# Patient Record
Sex: Female | Born: 1935
Health system: Southern US, Community
[De-identification: ages and names within clinical notes are randomized; demographics above are authoritative.]

## PROBLEM LIST (undated history)

## (undated) ENCOUNTER — Emergency Department (HOSPITAL_BASED_OUTPATIENT_CLINIC_OR_DEPARTMENT_OTHER): Payer: Medicare Other | Source: Home / Self Care

## (undated) DIAGNOSIS — C9000 Multiple myeloma not having achieved remission: Secondary | ICD-10-CM

## (undated) DIAGNOSIS — E785 Hyperlipidemia, unspecified: Secondary | ICD-10-CM

## (undated) DIAGNOSIS — R209 Unspecified disturbances of skin sensation: Secondary | ICD-10-CM

## (undated) DIAGNOSIS — R279 Unspecified lack of coordination: Secondary | ICD-10-CM

## (undated) DIAGNOSIS — I10 Essential (primary) hypertension: Secondary | ICD-10-CM

## (undated) DIAGNOSIS — B351 Tinea unguium: Secondary | ICD-10-CM

## (undated) DIAGNOSIS — R51 Headache: Secondary | ICD-10-CM

## (undated) DIAGNOSIS — T6391XA Toxic effect of contact with unspecified venomous animal, accidental (unintentional), initial encounter: Secondary | ICD-10-CM

## (undated) DIAGNOSIS — M542 Cervicalgia: Secondary | ICD-10-CM

## (undated) DIAGNOSIS — J209 Acute bronchitis, unspecified: Secondary | ICD-10-CM

## (undated) DIAGNOSIS — R040 Epistaxis: Secondary | ICD-10-CM

## (undated) DIAGNOSIS — H6120 Impacted cerumen, unspecified ear: Secondary | ICD-10-CM

## (undated) DIAGNOSIS — E669 Obesity, unspecified: Secondary | ICD-10-CM

## (undated) DIAGNOSIS — Z7189 Other specified counseling: Secondary | ICD-10-CM

## (undated) DIAGNOSIS — B373 Candidiasis of vulva and vagina: Secondary | ICD-10-CM

## (undated) DIAGNOSIS — M1990 Unspecified osteoarthritis, unspecified site: Secondary | ICD-10-CM

## (undated) DIAGNOSIS — R42 Dizziness and giddiness: Secondary | ICD-10-CM

## (undated) DIAGNOSIS — Z8679 Personal history of other diseases of the circulatory system: Secondary | ICD-10-CM

## (undated) DIAGNOSIS — F411 Generalized anxiety disorder: Secondary | ICD-10-CM

## (undated) DIAGNOSIS — Z87891 Personal history of nicotine dependence: Secondary | ICD-10-CM

## (undated) DIAGNOSIS — I6350 Cerebral infarction due to unspecified occlusion or stenosis of unspecified cerebral artery: Secondary | ICD-10-CM

## (undated) HISTORY — DX: Headache: R51

## (undated) HISTORY — DX: Dizziness and giddiness: R42

## (undated) HISTORY — DX: Cervicalgia: M54.2

## (undated) HISTORY — DX: Candidiasis of vulva and vagina: B37.3

## (undated) HISTORY — DX: Generalized anxiety disorder: F41.1

## (undated) HISTORY — DX: Impacted cerumen, unspecified ear: H61.20

## (undated) HISTORY — DX: Unspecified lack of coordination: R27.9

## (undated) HISTORY — DX: Personal history of other diseases of the circulatory system: Z86.79

## (undated) HISTORY — DX: Essential (primary) hypertension: I10

## (undated) HISTORY — DX: Unspecified osteoarthritis, unspecified site: M19.90

## (undated) HISTORY — DX: Acute bronchitis, unspecified: J20.9

## (undated) HISTORY — DX: Epistaxis: R04.0

## (undated) HISTORY — DX: Cerebral infarction due to unspecified occlusion or stenosis of unspecified cerebral artery: I63.50

## (undated) HISTORY — DX: Unspecified disturbances of skin sensation: R20.9

## (undated) HISTORY — DX: Toxic effect of contact with unspecified venomous animal, accidental (unintentional), initial encounter: T63.91XA

## (undated) HISTORY — DX: Tinea unguium: B35.1

## (undated) HISTORY — DX: Hyperlipidemia, unspecified: E78.5

## (undated) HISTORY — DX: Obesity, unspecified: E66.9

## (undated) HISTORY — PX: BREAST SURGERY: SHX581

## (undated) HISTORY — DX: Personal history of nicotine dependence: Z87.891

## (undated) HISTORY — PX: ABDOMINAL HYSTERECTOMY: SHX81

## (undated) HISTORY — DX: Multiple myeloma not having achieved remission: C90.00

---

## 1998-04-03 ENCOUNTER — Ambulatory Visit (HOSPITAL_COMMUNITY): Admission: RE | Admit: 1998-04-03 | Discharge: 1998-04-03 | Payer: Self-pay | Admitting: Internal Medicine

## 1999-04-17 ENCOUNTER — Ambulatory Visit (HOSPITAL_COMMUNITY): Admission: RE | Admit: 1999-04-17 | Discharge: 1999-04-17 | Payer: Self-pay

## 1999-04-17 ENCOUNTER — Encounter: Payer: Self-pay | Admitting: Internal Medicine

## 2000-04-16 ENCOUNTER — Emergency Department (HOSPITAL_COMMUNITY): Admission: EM | Admit: 2000-04-16 | Discharge: 2000-04-16 | Payer: Self-pay | Admitting: Emergency Medicine

## 2000-04-21 ENCOUNTER — Ambulatory Visit (HOSPITAL_COMMUNITY): Admission: RE | Admit: 2000-04-21 | Discharge: 2000-04-21 | Payer: Self-pay | Admitting: Internal Medicine

## 2000-04-21 ENCOUNTER — Encounter: Payer: Self-pay | Admitting: Internal Medicine

## 2001-04-26 ENCOUNTER — Encounter: Payer: Self-pay | Admitting: Internal Medicine

## 2001-04-26 ENCOUNTER — Ambulatory Visit (HOSPITAL_COMMUNITY): Admission: RE | Admit: 2001-04-26 | Discharge: 2001-04-26 | Payer: Self-pay | Admitting: Internal Medicine

## 2002-04-27 ENCOUNTER — Encounter: Payer: Self-pay | Admitting: Internal Medicine

## 2002-04-27 ENCOUNTER — Ambulatory Visit (HOSPITAL_COMMUNITY): Admission: RE | Admit: 2002-04-27 | Discharge: 2002-04-27 | Payer: Self-pay | Admitting: Internal Medicine

## 2002-05-03 ENCOUNTER — Encounter: Admission: RE | Admit: 2002-05-03 | Discharge: 2002-05-03 | Payer: Self-pay | Admitting: Internal Medicine

## 2002-05-03 ENCOUNTER — Encounter: Payer: Self-pay | Admitting: Internal Medicine

## 2002-08-24 ENCOUNTER — Encounter: Payer: Self-pay | Admitting: Internal Medicine

## 2002-08-24 ENCOUNTER — Encounter: Admission: RE | Admit: 2002-08-24 | Discharge: 2002-08-24 | Payer: Self-pay | Admitting: Internal Medicine

## 2002-10-06 ENCOUNTER — Ambulatory Visit (HOSPITAL_COMMUNITY): Admission: RE | Admit: 2002-10-06 | Discharge: 2002-10-06 | Payer: Self-pay | Admitting: Internal Medicine

## 2002-10-06 ENCOUNTER — Encounter: Payer: Self-pay | Admitting: Internal Medicine

## 2003-05-03 ENCOUNTER — Ambulatory Visit (HOSPITAL_COMMUNITY): Admission: RE | Admit: 2003-05-03 | Discharge: 2003-05-03 | Payer: Self-pay | Admitting: Internal Medicine

## 2003-08-17 ENCOUNTER — Inpatient Hospital Stay (HOSPITAL_COMMUNITY): Admission: EM | Admit: 2003-08-17 | Discharge: 2003-08-18 | Payer: Self-pay | Admitting: Emergency Medicine

## 2004-04-01 ENCOUNTER — Ambulatory Visit: Payer: Self-pay | Admitting: Internal Medicine

## 2004-04-11 ENCOUNTER — Ambulatory Visit: Payer: Self-pay | Admitting: Internal Medicine

## 2004-04-15 ENCOUNTER — Ambulatory Visit: Payer: Self-pay | Admitting: Internal Medicine

## 2004-05-15 ENCOUNTER — Encounter: Admission: RE | Admit: 2004-05-15 | Discharge: 2004-05-15 | Payer: Self-pay | Admitting: Internal Medicine

## 2005-03-14 ENCOUNTER — Ambulatory Visit: Payer: Self-pay | Admitting: Internal Medicine

## 2005-05-30 ENCOUNTER — Encounter: Admission: RE | Admit: 2005-05-30 | Discharge: 2005-05-30 | Payer: Self-pay | Admitting: Internal Medicine

## 2005-09-22 ENCOUNTER — Ambulatory Visit: Payer: Self-pay | Admitting: Internal Medicine

## 2005-10-28 ENCOUNTER — Ambulatory Visit: Payer: Self-pay | Admitting: Internal Medicine

## 2005-12-01 ENCOUNTER — Ambulatory Visit: Payer: Self-pay | Admitting: Internal Medicine

## 2006-03-11 ENCOUNTER — Ambulatory Visit: Payer: Self-pay | Admitting: Internal Medicine

## 2006-03-11 LAB — CONVERTED CEMR LAB
Albumin: 3.6 g/dL (ref 3.5–5.2)
Alkaline Phosphatase: 71 units/L (ref 39–117)
BUN: 9 mg/dL (ref 6–23)
Chloride: 107 meq/L (ref 96–112)
Glomerular Filtration Rate, Af Am: 70 mL/min/{1.73_m2}
Potassium: 3.9 meq/L (ref 3.5–5.1)
Total Bilirubin: 1.3 mg/dL — ABNORMAL HIGH (ref 0.3–1.2)
Total Protein: 6.7 g/dL (ref 6.0–8.3)
Triglyceride fasting, serum: 55 mg/dL (ref 0–149)

## 2006-05-21 ENCOUNTER — Ambulatory Visit: Payer: Self-pay | Admitting: Internal Medicine

## 2006-06-02 ENCOUNTER — Encounter: Admission: RE | Admit: 2006-06-02 | Discharge: 2006-06-02 | Payer: Self-pay | Admitting: Internal Medicine

## 2006-06-02 ENCOUNTER — Ambulatory Visit: Payer: Self-pay | Admitting: Internal Medicine

## 2006-06-12 ENCOUNTER — Encounter: Admission: RE | Admit: 2006-06-12 | Discharge: 2006-06-12 | Payer: Self-pay | Admitting: Internal Medicine

## 2006-07-06 ENCOUNTER — Ambulatory Visit: Payer: Self-pay | Admitting: Internal Medicine

## 2006-09-02 ENCOUNTER — Ambulatory Visit: Payer: Self-pay | Admitting: Internal Medicine

## 2006-12-07 ENCOUNTER — Ambulatory Visit: Payer: Self-pay | Admitting: Internal Medicine

## 2006-12-08 DIAGNOSIS — E785 Hyperlipidemia, unspecified: Secondary | ICD-10-CM

## 2006-12-08 DIAGNOSIS — M1990 Unspecified osteoarthritis, unspecified site: Secondary | ICD-10-CM | POA: Insufficient documentation

## 2006-12-08 DIAGNOSIS — I10 Essential (primary) hypertension: Secondary | ICD-10-CM

## 2006-12-08 HISTORY — DX: Hyperlipidemia, unspecified: E78.5

## 2006-12-08 HISTORY — DX: Essential (primary) hypertension: I10

## 2006-12-08 HISTORY — DX: Unspecified osteoarthritis, unspecified site: M19.90

## 2007-03-05 ENCOUNTER — Encounter: Payer: Self-pay | Admitting: Internal Medicine

## 2007-03-05 ENCOUNTER — Ambulatory Visit: Payer: Self-pay | Admitting: Internal Medicine

## 2007-03-05 DIAGNOSIS — F411 Generalized anxiety disorder: Secondary | ICD-10-CM

## 2007-03-05 DIAGNOSIS — T6391XA Toxic effect of contact with unspecified venomous animal, accidental (unintentional), initial encounter: Secondary | ICD-10-CM

## 2007-03-05 HISTORY — DX: Toxic effect of contact with unspecified venomous animal, accidental (unintentional), initial encounter: T63.91XA

## 2007-03-05 HISTORY — DX: Generalized anxiety disorder: F41.1

## 2007-03-05 LAB — CONVERTED CEMR LAB
AST: 17 units/L (ref 0–37)
Albumin: 3.8 g/dL (ref 3.5–5.2)
Alkaline Phosphatase: 69 units/L (ref 39–117)
BUN: 11 mg/dL (ref 6–23)
Bilirubin, Direct: 0.2 mg/dL (ref 0.0–0.3)
CO2: 31 meq/L (ref 19–32)
Calcium: 9.1 mg/dL (ref 8.4–10.5)
Cholesterol: 195 mg/dL (ref 0–200)
Creatinine, Ser: 0.7 mg/dL (ref 0.4–1.2)
GFR calc Af Amer: 105 mL/min
GFR calc non Af Amer: 86 mL/min
LDL Cholesterol: 127 mg/dL — ABNORMAL HIGH (ref 0–99)
Potassium: 4 meq/L (ref 3.5–5.1)
Sodium: 142 meq/L (ref 135–145)
TSH: 0.68 microintl units/mL (ref 0.35–5.50)
Total Protein: 7 g/dL (ref 6.0–8.3)
Vit D, 1,25-Dihydroxy: 35 (ref 30–89)

## 2007-09-10 ENCOUNTER — Ambulatory Visit: Payer: Self-pay | Admitting: Internal Medicine

## 2007-12-16 ENCOUNTER — Telehealth: Payer: Self-pay | Admitting: Internal Medicine

## 2008-03-01 ENCOUNTER — Ambulatory Visit: Payer: Self-pay | Admitting: Internal Medicine

## 2008-03-03 LAB — CONVERTED CEMR LAB
ALT: 14 units/L (ref 0–35)
AST: 16 units/L (ref 0–37)
Albumin: 3.3 g/dL — ABNORMAL LOW (ref 3.5–5.2)
Calcium: 8.8 mg/dL (ref 8.4–10.5)
Chloride: 109 meq/L (ref 96–112)
Crystals: NEGATIVE
GFR calc Af Amer: 104 mL/min
Glucose, Bld: 103 mg/dL — ABNORMAL HIGH (ref 70–99)
LDL Cholesterol: 102 mg/dL — ABNORMAL HIGH (ref 0–99)
Mucus, UA: NEGATIVE
Nitrite: NEGATIVE
Sodium: 146 meq/L — ABNORMAL HIGH (ref 135–145)
Total Bilirubin: 1 mg/dL (ref 0.3–1.2)
Total Protein, Urine: NEGATIVE mg/dL
Total Protein: 7 g/dL (ref 6.0–8.3)
pH: 5.5 (ref 5.0–8.0)

## 2008-03-09 ENCOUNTER — Ambulatory Visit: Payer: Self-pay | Admitting: Internal Medicine

## 2008-06-05 ENCOUNTER — Telehealth: Payer: Self-pay | Admitting: Internal Medicine

## 2008-06-05 ENCOUNTER — Ambulatory Visit: Payer: Self-pay | Admitting: Cardiology

## 2008-06-05 ENCOUNTER — Ambulatory Visit: Payer: Self-pay | Admitting: Internal Medicine

## 2008-06-05 DIAGNOSIS — R51 Headache: Secondary | ICD-10-CM

## 2008-06-05 DIAGNOSIS — R040 Epistaxis: Secondary | ICD-10-CM

## 2008-06-05 DIAGNOSIS — R519 Headache, unspecified: Secondary | ICD-10-CM | POA: Insufficient documentation

## 2008-06-05 DIAGNOSIS — H6120 Impacted cerumen, unspecified ear: Secondary | ICD-10-CM

## 2008-06-05 DIAGNOSIS — M542 Cervicalgia: Secondary | ICD-10-CM

## 2008-06-05 HISTORY — DX: Cervicalgia: M54.2

## 2008-06-05 HISTORY — DX: Epistaxis: R04.0

## 2008-06-05 HISTORY — DX: Impacted cerumen, unspecified ear: H61.20

## 2008-06-05 HISTORY — DX: Headache: R51

## 2008-07-04 ENCOUNTER — Telehealth: Payer: Self-pay | Admitting: Internal Medicine

## 2008-08-31 ENCOUNTER — Ambulatory Visit: Payer: Self-pay | Admitting: Internal Medicine

## 2008-08-31 LAB — CONVERTED CEMR LAB
ALT: 15 units/L (ref 0–35)
AST: 21 units/L (ref 0–37)
Alkaline Phosphatase: 68 units/L (ref 39–117)
CO2: 29 meq/L (ref 19–32)
Calcium: 9.4 mg/dL (ref 8.4–10.5)
Cholesterol: 162 mg/dL (ref 0–200)
Creatinine, Ser: 0.7 mg/dL (ref 0.4–1.2)
GFR calc non Af Amer: 104.01 mL/min (ref >60)
Glucose, Bld: 100 mg/dL — ABNORMAL HIGH (ref 70–99)
Leukocytes, UA: NEGATIVE
Potassium: 3.8 meq/L (ref 3.5–5.1)
Specific Gravity, Urine: >=1.03 (ref 1.000–1.030)
Total Bilirubin: 1.5 mg/dL — ABNORMAL HIGH (ref 0.3–1.2)
Total CHOL/HDL Ratio: 3
Total Protein, Urine: NEGATIVE mg/dL
Total Protein: 6.6 g/dL (ref 6.0–8.3)
Triglycerides: 53 mg/dL (ref 0.0–149.0)
VLDL: 10.6 mg/dL (ref 0.0–40.0)

## 2008-09-08 ENCOUNTER — Ambulatory Visit: Payer: Self-pay | Admitting: Internal Medicine

## 2008-09-08 DIAGNOSIS — B373 Candidiasis of vulva and vagina: Secondary | ICD-10-CM

## 2008-09-08 DIAGNOSIS — B3731 Acute candidiasis of vulva and vagina: Secondary | ICD-10-CM

## 2008-09-08 HISTORY — DX: Acute candidiasis of vulva and vagina: B37.31

## 2008-09-08 HISTORY — DX: Candidiasis of vulva and vagina: B37.3

## 2009-02-28 ENCOUNTER — Ambulatory Visit: Payer: Self-pay | Admitting: Internal Medicine

## 2009-03-01 LAB — CONVERTED CEMR LAB
ALT: 18 units/L (ref 0–35)
Albumin: 3.9 g/dL (ref 3.5–5.2)
Alkaline Phosphatase: 64 units/L (ref 39–117)
Bilirubin Urine: NEGATIVE
Bilirubin, Direct: 0.2 mg/dL (ref 0.0–0.3)
CO2: 31 meq/L (ref 19–32)
Chloride: 100 meq/L (ref 96–112)
Creatinine, Ser: 0.6 mg/dL (ref 0.4–1.2)
Glucose, Bld: 95 mg/dL (ref 70–99)
Hemoglobin, Urine: NEGATIVE
Ketones, ur: NEGATIVE mg/dL
Specific Gravity, Urine: 1.025 (ref 1.000–1.030)
Total CHOL/HDL Ratio: 3
Total Protein, Urine: NEGATIVE mg/dL
Total Protein: 7.3 g/dL (ref 6.0–8.3)
Triglycerides: 68 mg/dL (ref 0.0–149.0)
Urine Glucose: NEGATIVE mg/dL
Urobilinogen, UA: 0.2 (ref 0.0–1.0)
pH: 5.5 (ref 5.0–8.0)

## 2009-03-09 ENCOUNTER — Ambulatory Visit: Payer: Self-pay | Admitting: Internal Medicine

## 2009-03-09 DIAGNOSIS — B351 Tinea unguium: Secondary | ICD-10-CM

## 2009-03-09 DIAGNOSIS — Z87891 Personal history of nicotine dependence: Secondary | ICD-10-CM

## 2009-03-09 HISTORY — DX: Personal history of nicotine dependence: Z87.891

## 2009-03-09 HISTORY — DX: Tinea unguium: B35.1

## 2009-03-13 ENCOUNTER — Telehealth: Payer: Self-pay | Admitting: Internal Medicine

## 2009-03-20 ENCOUNTER — Telehealth: Payer: Self-pay | Admitting: Internal Medicine

## 2009-03-21 ENCOUNTER — Ambulatory Visit: Payer: Self-pay | Admitting: Internal Medicine

## 2009-03-22 LAB — CONVERTED CEMR LAB
Nitrite: NEGATIVE
Specific Gravity, Urine: >=1.03 (ref 1.000–1.030)
Urobilinogen, UA: 0.2 (ref 0.0–1.0)
pH: 5.5 (ref 5.0–8.0)

## 2009-09-07 ENCOUNTER — Ambulatory Visit: Payer: Self-pay | Admitting: Internal Medicine

## 2009-09-07 DIAGNOSIS — E669 Obesity, unspecified: Secondary | ICD-10-CM

## 2009-09-07 HISTORY — DX: Obesity, unspecified: E66.9

## 2009-10-10 ENCOUNTER — Telehealth: Payer: Self-pay | Admitting: Internal Medicine

## 2009-12-26 ENCOUNTER — Ambulatory Visit: Payer: Self-pay | Admitting: Internal Medicine

## 2009-12-26 DIAGNOSIS — R42 Dizziness and giddiness: Secondary | ICD-10-CM

## 2009-12-26 DIAGNOSIS — R279 Unspecified lack of coordination: Secondary | ICD-10-CM

## 2009-12-26 DIAGNOSIS — R209 Unspecified disturbances of skin sensation: Secondary | ICD-10-CM

## 2009-12-26 HISTORY — DX: Unspecified lack of coordination: R27.9

## 2009-12-26 HISTORY — DX: Dizziness and giddiness: R42

## 2009-12-26 HISTORY — DX: Unspecified disturbances of skin sensation: R20.9

## 2009-12-26 LAB — CONVERTED CEMR LAB
AST: 16 units/L (ref 0–37)
Albumin: 3.8 g/dL (ref 3.5–5.2)
BUN: 18 mg/dL (ref 6–23)
Basophils Absolute: 0 10*3/uL (ref 0.0–0.1)
CO2: 31 meq/L (ref 19–32)
Calcium: 8.9 mg/dL (ref 8.4–10.5)
Eosinophils Absolute: 0.1 10*3/uL (ref 0.0–0.7)
GFR calc non Af Amer: 52.6 mL/min (ref >60)
Glucose, Bld: 97 mg/dL (ref 70–99)
HCT: 37.8 % (ref 36.0–46.0)
Leukocytes, UA: NEGATIVE
Lymphocytes Relative: 24.8 % (ref 12.0–46.0)
Lymphs Abs: 2.1 10*3/uL (ref 0.7–4.0)
MCHC: 33.8 g/dL (ref 30.0–36.0)
Monocytes Relative: 6.5 % (ref 3.0–12.0)
Neutro Abs: 5.7 10*3/uL (ref 1.4–7.7)
Nitrite: NEGATIVE
Platelets: 221 10*3/uL (ref 150.0–400.0)
RDW: 15 % — ABNORMAL HIGH (ref 11.5–14.6)
Specific Gravity, Urine: 1.02 (ref 1.000–1.030)
TSH: 0.68 microintl units/mL (ref 0.35–5.50)
Total Bilirubin: 0.8 mg/dL (ref 0.3–1.2)
Urobilinogen, UA: 0.2 (ref 0.0–1.0)
pH: 7 (ref 5.0–8.0)

## 2009-12-27 ENCOUNTER — Telehealth: Payer: Self-pay | Admitting: Internal Medicine

## 2010-01-02 ENCOUNTER — Encounter: Admission: RE | Admit: 2010-01-02 | Discharge: 2010-01-02 | Payer: Self-pay | Admitting: Internal Medicine

## 2010-01-04 ENCOUNTER — Ambulatory Visit: Payer: Self-pay | Admitting: Internal Medicine

## 2010-01-04 DIAGNOSIS — I6350 Cerebral infarction due to unspecified occlusion or stenosis of unspecified cerebral artery: Secondary | ICD-10-CM

## 2010-01-04 DIAGNOSIS — Z8679 Personal history of other diseases of the circulatory system: Secondary | ICD-10-CM

## 2010-01-04 DIAGNOSIS — I639 Cerebral infarction, unspecified: Secondary | ICD-10-CM

## 2010-01-04 HISTORY — DX: Cerebral infarction due to unspecified occlusion or stenosis of unspecified cerebral artery: I63.50

## 2010-01-04 HISTORY — DX: Personal history of other diseases of the circulatory system: Z86.79

## 2010-02-12 ENCOUNTER — Ambulatory Visit: Payer: Self-pay | Admitting: Internal Medicine

## 2010-02-12 LAB — CONVERTED CEMR LAB
Chloride: 104 meq/L (ref 96–112)
GFR calc non Af Amer: 117.02 mL/min (ref >60)
Potassium: 4.4 meq/L (ref 3.5–5.1)
Sodium: 142 meq/L (ref 135–145)

## 2010-02-18 ENCOUNTER — Ambulatory Visit: Payer: Self-pay | Admitting: Internal Medicine

## 2010-03-07 ENCOUNTER — Ambulatory Visit: Payer: Self-pay | Admitting: Internal Medicine

## 2010-04-23 ENCOUNTER — Ambulatory Visit: Payer: Self-pay | Admitting: Internal Medicine

## 2010-06-15 ENCOUNTER — Encounter: Payer: Self-pay | Admitting: Orthopaedic Surgery

## 2010-06-16 ENCOUNTER — Encounter: Payer: Self-pay | Admitting: Internal Medicine

## 2010-06-24 ENCOUNTER — Telehealth: Payer: Self-pay | Admitting: Internal Medicine

## 2010-06-25 NOTE — Progress Notes (Signed)
Summary: REQ FOR RX  Phone Note Call from Patient Call back at Home Phone [phone]   Summary of Call: Patient is requesting rx for "sedative" prior to MRI.  Initial call taken by: Lamar Sprinkles, CMA,  December 27, 2009 1:34 PM  Follow-up for Phone Call        diazepam Follow-up by: Tresa Garter MD,  December 28, 2009 7:47 AM  Additional Follow-up for Phone Call Additional follow up Details #1::        Pt informed, rx called into [facility] [address] Additional Follow-up by: Lamar Sprinkles, CMA,  December 28, 2009 9:26 AM    New/Updated Medications: DIAZEPAM 5 MG TABS (DIAZEPAM) 1-2 tab 1 h prior to test Prescriptions: DIAZEPAM 5 MG TABS (DIAZEPAM) 1-2 tab 1 h prior to test  #4 x 0   Entered and Authorized by:   Tresa Garter MD   Signed by:   Tresa Garter MD on 12/28/2009   Method used:   Print then Give to Patient   RxID:   [id number]

## 2010-06-25 NOTE — Progress Notes (Signed)
Summary: pls call  Phone Note Call from Patient Call back at Home Phone [phone]   Caller: Patient Summary of Call: pt called stating that she is "very sick and needs some medicine!". Pt is requesting a call. Initial call taken by: Margaret Pyle, CMA,  Oct 10, 2009 3:57 PM  Follow-up for Phone Call        Spoke w/pt, She c/o sweats, body aches, chills, cough and sinus congestion. Has taken, benadryl, sudafed and nyquil w/no relief. Patient is requesting rx from [facility] Follow-up by: Lamar Sprinkles, CMA,  Oct 10, 2009 4:24 PM  Additional Follow-up for Phone Call Additional follow up Details #1::        ok Z pac OV if not well Additional Follow-up by: Tresa Garter MD,  Oct 10, 2009 6:03 PM    Additional Follow-up for Phone Call Additional follow up Details #2::    Pt informed  Follow-up by: Lamar Sprinkles, CMA,  Oct 10, 2009 6:12 PM  New/Updated Medications: ZITHROMAX Z-PAK 250 MG TABS (AZITHROMYCIN) as dirrected Prescriptions: ZITHROMAX Z-PAK 250 MG TABS (AZITHROMYCIN) as dirrected  #1 x 0   Entered and Authorized by:   Tresa Garter MD   Signed by:   Lamar Sprinkles, CMA on 10/10/2009   Method used:   Electronically to        [facility] [phone]* (retail)       [address]       [place], [place]  [address]       Ph: [phone]       Fax: [phone]   RxID:   [id number]

## 2010-06-25 NOTE — Assessment & Plan Note (Signed)
Summary: dizziness since last friday-lb   Vital Signs:  Patient profile:   77 year old female Height:      67 inches Weight:      250 pounds BMI:     39.30 O2 Sat:      95 % on Room air Temp:     99.0 degrees F oral Pulse rate:   80 / minute Pulse rhythm:   regular Resp:     16 per minute BP sitting:   148 / 80  (left arm) Cuff size:   large  Vitals Entered By: Lanier Prude, CMA(AAMA) (December 26, 2009 4:10 PM)  O2 Flow:  Room air CC: lightheaded X 5 days Is Patient Diabetic? No Comments pt states her head just doesnt feel right   CC:  lightheaded X 5 days.  History of Present Illness: C/o dizziness x 3-5 d, lightheaded, no "spinning". No falls. Denies HA. No injury or febrile illness. Took Zpac - not better...  Current Medications (verified): 1)  Aspir-Low 81 Mg Tbec (Aspirin) .[name].. 1 Once Daily Pc 2)  Toprol Xl 50 Mg  Xr24h-Tab (Metoprolol Succinate) .... Take 1 By Mouth Qd 3)  Pravastatin Sodium 40 Mg  Tabs (Pravastatin Sodium) .... One By Mouth At Bedtime 4)  Penlac 8 % Soln (Ciclopirox) .... Qd 5)  Sudafed 30 Mg Tabs (Pseudoephedrine Hcl) .... 2 Tabs Q 4 H As Needed 6)  Diphenhydramine Hcl 25 Mg Tabs (Diphenhydramine Hcl) .... 2 By Mouth Q 4 H As Needed Allergy  Allergies (verified): 1)  Hydrochlorothiazide 2)  Alprazolam (Alprazolam)  Past History:  Family History: Last updated: 12/08/2006 Family History Hypertension  Social History: Last updated: 03/05/2007 Retired Single Never Smoked Alcohol use-no Regular exercise-no Former Smoker  Past Medical History: Hyperlipidemia Hypertension Osteoarthritis Anxiety Fall with head injury (no LOC) 1/10  Past Surgical History: Denies surgical history  Review of Systems       The patient complains of muscle weakness and difficulty walking.  The patient denies fever, chest pain, dyspnea on exertion, abdominal pain, and severe indigestion/heartburn.    Physical Exam  Neurologic:  ataxic gait.      Impression & Recommendations:  Problem # 1:  ATAXIA (ICD-781.3) - r/o CVA Assessment New  Orders: TLB-B12, Serum-Total ONLY (60454-U98) TLB-BMP (Basic Metabolic Panel-BMET) (80048-METABOL) TLB-CBC Platelet - w/Differential (85025-CBCD) TLB-Hepatic/Liver Function Pnl (80076-HEPATIC) TLB-TSH (Thyroid Stimulating Hormone) (84443-TSH) TLB-Udip ONLY (81003-UDIP) Radiology Referral (Radiology)  Problem # 2:  DIZZINESS (ICD-780.4) Assessment: New  Her updated medication list for this problem includes:    Diphenhydramine Hcl 25 Mg Tabs (Diphenhydramine hcl) .[name]... 2 by mouth q 4 h as needed allergy  Orders: TLB-B12, Serum-Total ONLY (11914-N82) TLB-BMP (Basic Metabolic Panel-BMET) (80048-METABOL) TLB-CBC Platelet - w/Differential (85025-CBCD) TLB-Hepatic/Liver Function Pnl (80076-HEPATIC) TLB-TSH (Thyroid Stimulating Hormone) (84443-TSH) TLB-Udip ONLY (81003-UDIP) Radiology Referral (Radiology)  Problem # 3:  HYPERTENSION (ICD-401.9) Assessment: Unchanged  Her updated medication list for this problem includes:    Toprol Xl 50 Mg Xr24h-tab (Metoprolol succinate) .[name]... Take 1 by mouth qd  Orders: TLB-B12, Serum-Total ONLY (95621-H08) TLB-BMP (Basic Metabolic Panel-BMET) (80048-METABOL) TLB-CBC Platelet - w/Differential (85025-CBCD) TLB-Hepatic/Liver Function Pnl (80076-HEPATIC) TLB-TSH (Thyroid Stimulating Hormone) (84443-TSH) TLB-Udip ONLY (81003-UDIP)  BP today: 148/80 Prior BP: 140/76 (09/07/2009)  Labs Reviewed: K+: 4.0 (02/28/2009) Creat: : 0.6 (02/28/2009)   Chol: 175 (02/28/2009)   HDL: 55.10 (02/28/2009)   LDL: 106 (02/28/2009)   TG: 68.0 (02/28/2009)  Complete Medication List: 1)  Aspir-low 81 Mg Tbec (Aspirin) .[name].. 1 once daily pc  2)  Toprol Xl 50 Mg Xr24h-tab (Metoprolol succinate) .... Take 1 by mouth qd 3)  Pravastatin Sodium 40 Mg Tabs (Pravastatin sodium) .... One by mouth at bedtime 4)  Penlac 8 % Soln (Ciclopirox) .... Qd 5)  Sudafed 30 Mg Tabs  (Pseudoephedrine hcl) .... 2 tabs q 4 h as needed 6)  Diphenhydramine Hcl 25 Mg Tabs (Diphenhydramine hcl) .... 2 by mouth q 4 h as needed allergy 7)  Diazepam 5 Mg Tabs (Diazepam) .[name].. 1-2 tab 1 h prior to test  Patient Instructions: 1)  Please schedule a follow-up appointment in 1-2 weeks. 2)  Call if you are not better in a reasonable amount of time or if worse. Go to ER if feeling really bad!  3)  Take 4 baby aspirins a day  Appended Document: dizziness since last friday-lb PE: NAD CV: RRR Lungs CTA LE no edema Abd S/NT A/O/C

## 2010-06-25 NOTE — Assessment & Plan Note (Signed)
Summary: 2 wk rov /nws   Vital Signs:  Patient profile:   77 year old female Height:      67 inches Weight:      246 pounds BMI:     22.95 Temp:     98.4 degrees F oral Pulse rate:   72 / minute Pulse rhythm:   regular Resp:     16 per minute BP sitting:   156 / 90  (left arm) Cuff size:   large  Vitals Entered By: Lanier Prude, CMA(AAMA) (March 07, 2010 10:01 AM) CC: 2 wk f/u Is Patient Diabetic? No   CC:  2 wk f/u.  History of Present Illness: The patient presents for a follow up of hypertension, CVA, hyperlipidemia   Current Medications (verified): 1)  Losartan Potassium 100 Mg Tabs (Losartan Potassium) .[name].. 1 By Mouth Once Daily For Blood Pressure 2)  Toprol Xl 50 Mg  Xr24h-Tab (Metoprolol Succinate) .... Take 1 By Mouth Once Daily At Night 3)  Pravastatin Sodium 40 Mg  Tabs (Pravastatin Sodium) .... One By Mouth At Bedtime 4)  Penlac 8 % Soln (Ciclopirox) .... Qd 5)  Sudafed 30 Mg Tabs (Pseudoephedrine Hcl) .... 2 Tabs Q 4 H As Needed 6)  Diphenhydramine Hcl 25 Mg Tabs (Diphenhydramine Hcl) .... 2 By Mouth Q 4 H As Needed Allergy 7)  Diazepam 5 Mg Tabs (Diazepam) .[name].. 1-2 Tab 1 H Prior To Test 8)  Aspir-Low 81 Mg Tbec (Aspirin) .[name].. 1 Once Daily Pc  Allergies (verified): 1)  Hydrochlorothiazide 2)  Alprazolam (Alprazolam)  Past History:  Past Medical History: Last updated: 01/04/2010 Hyperlipidemia Hypertension Osteoarthritis Anxiety Fall with head injury (no LOC) 1/10 Cerebrovascular accident - lacunar 2011 (vertigo, ataxia)  Social History: Last updated: 03/05/2007 Retired Single Never Smoked Alcohol use-no Regular exercise-no Former Smoker  Review of Systems  The patient denies chest pain, abdominal pain, difficulty walking, and depression.    Physical Exam  General:  overweight-appearing.   Mouth:  Oral mucosa and oropharynx without lesions or exudates.  Teeth in good repair. Lungs:  Normal respiratory effort, chest expands  symmetrically. Lungs are clear to auscultation, no crackles or wheezes. Heart:  Normal rate and regular rhythm. S1 and S2 normal without gallop, murmur, click, rub or other extra sounds. Abdomen:  Bowel sounds positive,abdomen soft and non-tender without masses, organomegaly or hernias noted. Msk:  No deformity or scoliosis noted of thoracic or lumbar spine.  Hands w/OA changes Neurologic:  Less ataxic gait.  alert & oriented X3, cranial nerves II-XII intact, and DTRs symmetrical and normal.   Skin:  Intact without suspicious lesions or rashes 5th L fingernail torn off, stub discolored Psych:  Cognition and judgment appear intact. Alert and cooperative with normal attention span and concentration. No apparent delusions, illusions, hallucinations   Impression & Recommendations:  Problem # 1:  LACUNAR INFARCTION (ICD-434.91) Assessment Unchanged  Her updated medication list for this problem includes:    Aspir-low 81 Mg Tbec (Aspirin) .[name]... 1 once daily pc  Problem # 2:  OBESITY (ICD-278.00) Assessment: Unchanged Discussed  Problem # 3:  HYPERTENSION (ICD-401.9) Assessment: Improved  Her updated medication list for this problem includes:    Losartan Potassium 100 Mg Tabs (Losartan potassium) .[name]... 1 by mouth once daily for blood pressure    Toprol Xl 50 Mg Xr24h-tab (Metoprolol succinate) .[name]... Take 1 by mouth bid    Amlodipine Besylate 5 Mg Tabs (Amlodipine besylate) .[name]... 1 by mouth once daily added  Problem # 4:  HYPERLIPIDEMIA (  ICD-272.4) Assessment: Unchanged  Her updated medication list for this problem includes:    Pravastatin Sodium 40 Mg Tabs (Pravastatin sodium) ..... One by mouth at bedtime  Complete Medication List: 1)  Losartan Potassium 100 Mg Tabs (Losartan potassium) .[name].. 1 by mouth once daily for blood pressure 2)  Toprol Xl 50 Mg Xr24h-tab (Metoprolol succinate) .... Take 1 by mouth bid 3)  Pravastatin Sodium 40 Mg Tabs (Pravastatin sodium) .... One by mouth at  bedtime 4)  Penlac 8 % Soln (Ciclopirox) .... Qd 5)  Diphenhydramine Hcl 25 Mg Tabs (Diphenhydramine hcl) .... 2 by mouth q 4 h as needed allergy 6)  Aspir-low 81 Mg Tbec (Aspirin) .[name].. 1 once daily pc 7)  Amlodipine Besylate 5 Mg Tabs (Amlodipine besylate) .[name].. 1 by mouth qd  Patient Instructions: 1)  Please schedule a follow-up appointment in 6 weeks. Prescriptions: AMLODIPINE BESYLATE 5 MG TABS (AMLODIPINE BESYLATE) 1 by mouth qd  #30 x 12   Entered and Authorized by:   Tresa Garter MD   Signed by:   Tresa Garter MD on 03/07/2010   Method used:   Print then Give to Patient   RxID:   [id number] AMLODIPINE BESYLATE 5 MG TABS (AMLODIPINE BESYLATE) 1 by mouth qd  #30 x 12   Entered and Authorized by:   Tresa Garter MD   Signed by:   Tresa Garter MD on 03/07/2010   Method used:   Electronically to        [facility] [phone]* (retail)       [address]       [place], [place]  [address]       Ph: [phone]       Fax: [phone]   RxID:   [id number]    Immunization History:  Pneumovax Immunization History:    Pneumovax:  historical (12/31/2004)

## 2010-06-25 NOTE — Assessment & Plan Note (Signed)
Summary: 6 mos f/u #/cd   Vital Signs:  Patient profile:   77 year old female Height:      67 inches Weight:      245.75 pounds BMI:     38.63 O2 Sat:      98 % on Room air Temp:     98.4 degrees F oral Pulse rate:   74 / minute BP sitting:   140 / 76  (left arm) Cuff size:   large  Vitals Entered By: Lucious Groves (September 07, 2009 2:36 PM)  O2 Flow:  Room air CC: F/U--Per pt she "feels like dancing" no symptoms or complaints./kb Is Patient Diabetic? No Pain Assessment Patient in pain? no        CC:  F/U--Per pt she "feels like dancing" no symptoms or complaints./kb.  History of Present Illness: The patient presents for a follow up of hypertension, OA, hyperlipidemia; f/u insect allergy   Current Medications (verified): 1)  Aspir-Low 81 Mg Tbec (Aspirin) .[name].. 1 Once Daily Pc 2)  Toprol Xl 50 Mg  Xr24h-Tab (Metoprolol Succinate) .... Take 1 By Mouth Qd 3)  Pravastatin Sodium 40 Mg  Tabs (Pravastatin Sodium) .... One By Mouth At Bedtime 4)  Penlac 8 % Soln (Ciclopirox) .... Qd  Allergies (verified): 1)  Hydrochlorothiazide 2)  Alprazolam (Alprazolam)  Past History:  Past Medical History: Last updated: 03/05/2007 Hyperlipidemia Hypertension Osteoarthritis Anxiety  Social History: Last updated: 03/05/2007 Retired Single Never Smoked Alcohol use-no Regular exercise-no Former Smoker  Review of Systems  The patient denies fever, dyspnea on exertion, and abdominal pain.    Physical Exam  General:  overweight-appearing.   Mouth:  Oral mucosa and oropharynx without lesions or exudates.  Teeth in good repair. Lungs:  Normal respiratory effort, chest expands symmetrically. Lungs are clear to auscultation, no crackles or wheezes. Heart:  Normal rate and regular rhythm. S1 and S2 normal without gallop, murmur, click, rub or other extra sounds. Abdomen:  Bowel sounds positive,abdomen soft and non-tender without masses, organomegaly or hernias noted. Msk:  No  deformity or scoliosis noted of thoracic or lumbar spine.  Hands w/OA changes Neurologic:  No cranial nerve deficits noted. Station and gait are normal. Plantar reflexes are down-going bilaterally. DTRs are symmetrical throughout. Sensory, motor and coordinative functions appear intact.alert & oriented X3.   Skin:  Intact without suspicious lesions or rashes 5th L fingernail torn off, stub discolored Psych:  Cognition and judgment appear intact. Alert and cooperative with normal attention span and concentration. No apparent delusions, illusions, hallucinations   Impression & Recommendations:  Problem # 1:  OSTEOARTHRITIS (ICD-715.90)  Her updated medication list for this problem includes:    Aspir-low 81 Mg Tbec (Aspirin) .[name]... 1 once daily pc  Problem # 2:  HYPERTENSION (ICD-401.9) Assessment: Comment Only  Her updated medication list for this problem includes:    Toprol Xl 50 Mg Xr24h-tab (Metoprolol succinate) .[name]... Take 1 by mouth qd  Problem # 3:  HYPERLIPIDEMIA (ICD-272.4) Assessment: Comment Only  Her updated medication list for this problem includes:    Pravastatin Sodium 40 Mg Tabs (Pravastatin sodium) ..... One by mouth at bedtime  Problem # 4:  HEADACHE (ICD-784.0)  Her updated medication list for this problem includes:    Aspir-low 81 Mg Tbec (Aspirin) .[name]... 1 once daily pc    Toprol Xl 50 Mg Xr24h-tab (Metoprolol succinate) .[name]... Take 1 by mouth qd  Problem # 5:  Reaction to wasp venom Sudafed and Benadr as needed ASAP Does  not want Epipen  Problem # 6:  OBESITY (ICD-278.00) Assessment: Unchanged Diet discussed  Complete Medication List: 1)  Aspir-low 81 Mg Tbec (Aspirin) .[name].. 1 once daily pc 2)  Toprol Xl 50 Mg Xr24h-tab (Metoprolol succinate) .... Take 1 by mouth qd 3)  Pravastatin Sodium 40 Mg Tabs (Pravastatin sodium) .... One by mouth at bedtime 4)  Penlac 8 % Soln (Ciclopirox) .... Qd 5)  Sudafed 30 Mg Tabs (Pseudoephedrine hcl) .... 2 tabs q 4 h as  needed 6)  Diphenhydramine Hcl 25 Mg Tabs (Diphenhydramine hcl) .... 2 by mouth q 4 h as needed allergy  Patient Instructions: 1)  Please schedule a follow-up appointment in 6 months well w/labs. Prescriptions: SUDAFED 30 MG TABS (PSEUDOEPHEDRINE HCL) 2 tabs q 4 h as needed  #30 x 1   Entered and Authorized by:   Tresa Garter MD   Signed by:   Tresa Garter MD on 09/07/2009   Method used:   Print then Give to Patient   RxID:   [id number] DIPHENHYDRAMINE HCL 25 MG TABS (DIPHENHYDRAMINE HCL) 2 by mouth q 4 h as needed allergy  #60 x 1   Entered and Authorized by:   Tresa Garter MD   Signed by:   Tresa Garter MD on 09/07/2009   Method used:   Print then Give to Patient   RxID:   [id number] DIPHENHYDRAMINE HCL 25 MG TABS (DIPHENHYDRAMINE HCL) 2 by mouth q 4 h as needed allergy  #60 x 1   Entered and Authorized by:   Tresa Garter MD   Signed by:   Tresa Garter MD on 09/07/2009   Method used:   Electronically to        [facility] [phone]* (retail)       [address]       [place], [place]  [address]       Ph: [phone]       Fax: [phone]   RxID:   [id number] SUDAFED 30 MG TABS (PSEUDOEPHEDRINE HCL) 2 tabs q 4 h as needed  #30 x 1   Entered and Authorized by:   Tresa Garter MD   Signed by:   Tresa Garter MD on 09/07/2009   Method used:   Electronically to        [facility] [phone]* (retail)       [address]       [place], [place]  [address]       Ph: [phone]       Fax: [phone]   RxID:   [id number]

## 2010-06-25 NOTE — Assessment & Plan Note (Signed)
Summary: 6 WK FU--STC   Vital Signs:  Patient profile:   77 year old female Height:      67 inches Weight:      242 pounds BMI:     38.04 Temp:     98.9 degrees F oral Pulse rate:   76 / minute Pulse rhythm:   regular Resp:     16 per minute BP sitting:   160 / 80  (left arm) Cuff size:   large  Vitals Entered By: Lanier Prude, CMA(AAMA) (April 23, 2010 10:22 AM) CC: 6 wk f/u  Is Patient Diabetic? No   CC:  6 wk f/u .  History of Present Illness: The patient presents for a follow up of hypertension, CVA, hyperlipidemia   Current Medications (verified): 1)  Losartan Potassium 100 Mg Tabs (Losartan Potassium) .[name].. 1 By Mouth Once Daily For Blood Pressure 2)  Toprol Xl 50 Mg  Xr24h-Tab (Metoprolol Succinate) .... Take 1 By Mouth Bid 3)  Pravastatin Sodium 40 Mg  Tabs (Pravastatin Sodium) .... One By Mouth At Bedtime 4)  Diphenhydramine Hcl 25 Mg Tabs (Diphenhydramine Hcl) .... 2 By Mouth Q 4 H As Needed Allergy 5)  Aspir-Low 81 Mg Tbec (Aspirin) .[name].. 1 Once Daily Pc 6)  Amlodipine Besylate 5 Mg Tabs (Amlodipine Besylate) .[name].. 1 By Mouth Qd  Allergies (verified): 1)  Hydrochlorothiazide 2)  Alprazolam (Alprazolam)  Past History:  Past Medical History: Last updated: 01/04/2010 Hyperlipidemia Hypertension Osteoarthritis Anxiety Fall with head injury (no LOC) 1/10 Cerebrovascular accident - lacunar 2011 (vertigo, ataxia)  Social History: Last updated: 03/05/2007 Retired Single Never Smoked Alcohol use-no Regular exercise-no Former Smoker  Review of Systems  The patient denies weight loss, chest pain, syncope, abdominal pain, and melena.    Physical Exam  General:  overweight-appearing.   Nose:  Swollen w/abrasions Mouth:  Oral mucosa and oropharynx without lesions or exudates.  Teeth in good repair. Lungs:  Normal respiratory effort, chest expands symmetrically. Lungs are clear to auscultation, no crackles or wheezes. Heart:  Normal rate and regular  rhythm. S1 and S2 normal without gallop, murmur, click, rub or other extra sounds. Abdomen:  Bowel sounds positive,abdomen soft and non-tender without masses, organomegaly or hernias noted. Msk:  No deformity or scoliosis noted of thoracic or lumbar spine.  Hands w/OA changes Extremities:  No clubbing, cyanosis, edema, or deformity noted with normal full range of motion of all joints.   Neurologic:  Not ataxic gait.  alert & oriented X3, cranial nerves II-XII intact, and DTRs symmetrical and normal.   Skin:  Intact without suspicious lesions or rashes 5th L fingernail torn off, stub discolored Psych:  Cognition and judgment appear intact. Alert and cooperative with normal attention span and concentration. No apparent delusions, illusions, hallucinations   Impression & Recommendations:  Problem # 1:  HYPERTENSION (ICD-401.9) Assessment Unchanged SBP at home 140-150 Her updated medication list for this problem includes:    Losartan Potassium 100 Mg Tabs (Losartan potassium) .[name]... 1 by mouth once daily for blood pressure    Toprol Xl 50 Mg Xr24h-tab (Metoprolol succinate) .[name]... Take 1 by mouth bid    Amlodipine Besylate 5 Mg Tabs (Amlodipine besylate) .[name]... 1 by mouth qd    Spironolactone 50 Mg Tabs (Spironolactone) .[name]... 1 by mouth qam for blood pressure  Problem # 2:  CEREBROVASCULAR ACCIDENT, HX OF (ICD-V12.50) Assessment: Comment Only on ASA  Problem # 3:  ATAXIA (ICD-781.3) Assessment: Improved  Problem # 4:  ANXIETY (ICD-300.00) Assessment: Improved  Problem # 5:  OBESITY (ICD-278.00) Assessment: Improved  Complete Medication List: 1)  Losartan Potassium 100 Mg Tabs (Losartan potassium) .[name].. 1 by mouth once daily for blood pressure 2)  Toprol Xl 50 Mg Xr24h-tab (Metoprolol succinate) .... Take 1 by mouth bid 3)  Pravastatin Sodium 40 Mg Tabs (Pravastatin sodium) .... One by mouth at bedtime 4)  Diphenhydramine Hcl 25 Mg Tabs (Diphenhydramine hcl) .... 2 by mouth q 4 h as  needed allergy 5)  Amlodipine Besylate 5 Mg Tabs (Amlodipine besylate) .[name].. 1 by mouth qd 6)  Spironolactone 50 Mg Tabs (Spironolactone) .[name].. 1 by mouth qam for blood pressure 7)  Vitamin D 1000 Unit Tabs (Cholecalciferol) .[name].. 1 by mouth qd 8)  Aspir-low 81 Mg Tbec (Aspirin) .[name].. 1 once daily pc  Patient Instructions: 1)  Please schedule a follow-up appointment in 3 months or sooner if BP is up. 2)  BMP prior to visit, ICD-9: 401.1 3)  Spironolactone - start with 1/2 tab/day Prescriptions: SPIRONOLACTONE 50 MG TABS (SPIRONOLACTONE) 1 by mouth qam for blood pressure  #90 x 3   Entered and Authorized by:   Tresa Garter MD   Signed by:   Tresa Garter MD on 04/23/2010   Method used:   Faxed to ...       [facility] education [facility])       [address] [address]       [place], [place]  [address]       Ph: [phone]       Fax: [phone]   RxID:   [id number] AMLODIPINE BESYLATE 5 MG TABS (AMLODIPINE BESYLATE) 1 by mouth qd  #90 x 3   Entered and Authorized by:   Tresa Garter MD   Signed by:   Tresa Garter MD on 04/23/2010   Method used:   Faxed to ...       [facility] education [facility])       [address] [address]       [place], [place]  [address]       Ph: [phone]       Fax: [phone]   RxID:   [id number] PRAVASTATIN SODIUM 40 MG  TABS (PRAVASTATIN SODIUM) one by mouth at bedtime  #90 Tablet x 1   Entered and Authorized by:   Tresa Garter MD   Signed by:   Tresa Garter MD on 04/23/2010   Method used:   Faxed to ...       [facility] education [facility])       [address] [address]       [place], [place]  [address]       Ph: [phone]       Fax: [phone]   RxID:   [id number] TOPROL XL 50 MG  XR24H-TAB (METOPROLOL SUCCINATE) take 1 by mouth bid  #180 x 3   Entered and Authorized by:   Tresa Garter MD   Signed by:   Tresa Garter MD on 04/23/2010   Method used:   Faxed to ...       [facility] education [facility])       [address] [address]        [place], [place]  [address]       Ph: [phone]       Fax: [phone]   RxID:   [id number] LOSARTAN POTASSIUM 100 MG TABS (LOSARTAN POTASSIUM) 1 by mouth once daily for blood pressure  #90 x 3   Entered and Authorized by:   Tresa Garter MD  Signed by:   Tresa Garter MD on 04/23/2010   Method used:   Faxed to ...       [facility] education officer)       [address] [address]       [place], [place]  [address]       Ph: [phone]       Fax: [phone]   RxID:   [id number] VITAMIN D 1000 UNIT TABS (CHOLECALCIFEROL) 1 by mouth qd  #90 x 3   Entered and Authorized by:   Tresa Garter MD   Signed by:   Tresa Garter MD on 04/23/2010   Method used:   Print then Give to Patient   RxID:   [id number] AMLODIPINE BESYLATE 5 MG TABS (AMLODIPINE BESYLATE) 1 by mouth qd  #90 x 3   Entered and Authorized by:   Tresa Garter MD   Signed by:   Tresa Garter MD on 04/23/2010   Method used:   Print then Give to Patient   RxID:   [id number] PRAVASTATIN SODIUM 40 MG  TABS (PRAVASTATIN SODIUM) one by mouth at bedtime  #90 Tablet x 1   Entered and Authorized by:   Tresa Garter MD   Signed by:   Tresa Garter MD on 04/23/2010   Method used:   Print then Give to Patient   RxID:   [id number] TOPROL XL 50 MG  XR24H-TAB (METOPROLOL SUCCINATE) take 1 by mouth bid  #180 x 3   Entered and Authorized by:   Tresa Garter MD   Signed by:   Tresa Garter MD on 04/23/2010   Method used:   Print then Give to Patient   RxID:   [id number] POTASSIUM 100 MG TABS (LOSARTAN POTASSIUM) 1 by mouth once daily for blood pressure  #90 x 3   Entered and Authorized by:   Tresa Garter MD   Signed by:   Tresa Garter MD on 04/23/2010   Method used:   Print then Give to Patient   RxID:   [id number] SPIRONOLACTONE 50 MG TABS (SPIRONOLACTONE) 1 by mouth qam for blood pressure  #90 x 3   Entered and Authorized by:    Tresa Garter MD   Signed by:   Tresa Garter MD on 04/23/2010   Method used:   Print then Give to Patient   RxID:   [id number]    Orders Added: 1)  Est. Patient Level IV [93818]    Contraindications/Deferment of Procedures/Staging:    Test/Procedure: DPT vaccine    Reason for deferment: declined

## 2010-06-25 NOTE — Assessment & Plan Note (Signed)
Summary: 7-10 DAY FU/NWS   Vital Signs:  Patient profile:   77 year old female Height:      67 inches Weight:      248.50 pounds BMI:     39.06 O2 Sat:      96 % on Room air Temp:     98.7 degrees F oral Pulse rate:   78 / minute Pulse rhythm:   regular BP sitting:   174 / 100  (left arm) Cuff size:   large  Vitals Entered By: Rock Nephew CMA (January 04, 2010 2:47 PM)  O2 Flow:  Room air   History of Present Illness: F/u ataxia, HTN, dizziness. She is a little better.  Current Medications (verified): 1)  Aspir-Low 81 Mg Tbec (Aspirin) .[name].. 1 Once Daily Pc 2)  Toprol Xl 50 Mg  Xr24h-Tab (Metoprolol Succinate) .... Take 1 By Mouth Qd 3)  Pravastatin Sodium 40 Mg  Tabs (Pravastatin Sodium) .... One By Mouth At Bedtime 4)  Penlac 8 % Soln (Ciclopirox) .... Qd 5)  Sudafed 30 Mg Tabs (Pseudoephedrine Hcl) .... 2 Tabs Q 4 H As Needed 6)  Diphenhydramine Hcl 25 Mg Tabs (Diphenhydramine Hcl) .... 2 By Mouth Q 4 H As Needed Allergy 7)  Diazepam 5 Mg Tabs (Diazepam) .[name].. 1-2 Tab 1 H Prior To Test  Allergies (verified): 1)  Hydrochlorothiazide 2)  Alprazolam (Alprazolam)  Past History:  Social History: Last updated: 03/05/2007 Retired Single Never Smoked Alcohol use-no Regular exercise-no Former Smoker  Past Medical History: Hyperlipidemia Hypertension Osteoarthritis Anxiety Fall with head injury (no LOC) 1/10 Cerebrovascular accident - lacunar 2011 (vertigo, ataxia)  Review of Systems       The patient complains of difficulty walking.  The patient denies fever, weight loss, weight gain, peripheral edema, abdominal pain, and depression.    Physical Exam  General:  overweight-appearing.   Nose:  Swollen w/abrasions Mouth:  Oral mucosa and oropharynx without lesions or exudates.  Teeth in good repair. Neck:  Cervical spine is tender to palpation over paraspinal muscles and with the ROM  Lungs:  Normal respiratory effort, chest expands symmetrically. Lungs are  clear to auscultation, no crackles or wheezes. Heart:  Normal rate and regular rhythm. S1 and S2 normal without gallop, murmur, click, rub or other extra sounds. Abdomen:  Bowel sounds positive,abdomen soft and non-tender without masses, organomegaly or hernias noted. Msk:  No deformity or scoliosis noted of thoracic or lumbar spine.  Hands w/OA changes Neurologic:  Less ataxic gait.  alert & oriented X3, cranial nerves II-XII intact, and DTRs symmetrical and normal.   Skin:  Intact without suspicious lesions or rashes 5th L fingernail torn off, stub discolored Psych:  Cognition and judgment appear intact. Alert and cooperative with normal attention span and concentration. No apparent delusions, illusions, hallucinations   Impression & Recommendations:  Problem # 1:  ATAXIA (ICD-781.3) due to #4 Assessment Improved See "Patient Instructions".  Orders: Physical Therapy Referral (PT)  Problem # 2:  DIZZINESS (ICD-780.4) due to #4 Assessment: Improved  Her updated medication list for this problem includes:    Diphenhydramine Hcl 25 Mg Tabs (Diphenhydramine hcl) .[name]... 2 by mouth q 4 h as needed allergy  Orders: Physical Therapy Referral (PT)  Problem # 3:  HYPERTENSION (ICD-401.9) needs to be better controlled Assessment: Unchanged  Her updated medication list for this problem includes:    Toprol Xl 50 Mg Xr24h-tab (Metoprolol succinate) .[name]... Take 1 by mouth qd    Losartan Potassium 100 Mg Tabs (  Losartan potassium) .[name]... 1 by mouth once daily for blood pressure - added  Problem # 4:  LACUNAR INFARCTION (ICD-434.91) Assessment: New The MRI was reviewed with the patient.  Her updated medication list for this problem includes:    Aspir-low 81 Mg Tbec (Aspirin) .[name]... 1 once daily pc  Problem # 5:  ANXIETY (ICD-300.00) Assessment: Unchanged  Her updated medication list for this problem includes:    Diazepam 5 Mg Tabs (Diazepam) .[name]... 1-2 tab 1 h prior to test  Complete  Medication List: 1)  Aspir-low 81 Mg Tbec (Aspirin) .[name].. 1 once daily pc 2)  Toprol Xl 50 Mg Xr24h-tab (Metoprolol succinate) .... Take 1 by mouth qd 3)  Pravastatin Sodium 40 Mg Tabs (Pravastatin sodium) .... One by mouth at bedtime 4)  Penlac 8 % Soln (Ciclopirox) .... Qd 5)  Sudafed 30 Mg Tabs (Pseudoephedrine hcl) .... 2 tabs q 4 h as needed 6)  Diphenhydramine Hcl 25 Mg Tabs (Diphenhydramine hcl) .... 2 by mouth q 4 h as needed allergy 7)  Diazepam 5 Mg Tabs (Diazepam) .[name].. 1-2 tab 1 h prior to test 8)  Losartan Potassium 100 Mg Tabs (Losartan potassium) .[name].. 1 by mouth once daily for blood pressure  Patient Instructions: 1)  Use  balance exercises that I have provided (5 min. or longer every day) 2)  Please schedule a follow-up appointment in 6 weeks. 3)  BMP prior to visit, ICD-9:401.1 Prescriptions: LOSARTAN POTASSIUM 100 MG TABS (LOSARTAN POTASSIUM) 1 by mouth once daily for blood pressure  #30 x 12   Entered and Authorized by:   Tresa Garter MD   Signed by:   Tresa Garter MD on 01/04/2010   Method used:   Print then Give to Patient   RxID:   [id number]

## 2010-06-25 NOTE — Assessment & Plan Note (Signed)
Summary: 6 WK ROV /NWS  #   Vital Signs:  Patient profile:   77 year old female Height:      67 inches Weight:      244 pounds BMI:     38.35 O2 Sat:      99 % Temp:     99.1 degrees F oral Pulse rate:   89 / minute BP sitting:   180 / 100  (left arm) Cuff size:   large  Vitals Entered By: Alysia Penna (February 18, 2010 1:11 PM) CC: pt here for f/u. /cp sma   CC:  pt here for f/u. /cp sma.  History of Present Illness: The patient presents for a follow up of hypertension, lacunar strokes, ataxia, hyperlipidemia   Current Medications (verified): 1)  Aspir-Low 81 Mg Tbec (Aspirin) .[name].. 1 Once Daily Pc 2)  Toprol Xl 50 Mg  Xr24h-Tab (Metoprolol Succinate) .... Take 1 By Mouth Qd 3)  Pravastatin Sodium 40 Mg  Tabs (Pravastatin Sodium) .... One By Mouth At Bedtime 4)  Penlac 8 % Soln (Ciclopirox) .... Qd 5)  Sudafed 30 Mg Tabs (Pseudoephedrine Hcl) .... 2 Tabs Q 4 H As Needed 6)  Diphenhydramine Hcl 25 Mg Tabs (Diphenhydramine Hcl) .... 2 By Mouth Q 4 H As Needed Allergy 7)  Diazepam 5 Mg Tabs (Diazepam) .[name].. 1-2 Tab 1 H Prior To Test 8)  Losartan Potassium 100 Mg Tabs (Losartan Potassium) .[name].. 1 By Mouth Once Daily For Blood Pressure  Allergies (verified): 1)  Hydrochlorothiazide 2)  Alprazolam (Alprazolam)  Past History:  Past Medical History: Last updated: 01/04/2010 Hyperlipidemia Hypertension Osteoarthritis Anxiety Fall with head injury (no LOC) 1/10 Cerebrovascular accident - lacunar 2011 (vertigo, ataxia)  Past Surgical History: Last updated: 12/26/2009 Denies surgical history  Social History: Last updated: 03/05/2007 Retired Single Never Smoked Alcohol use-no Regular exercise-no Former Smoker  Review of Systems  The patient denies fever, syncope, prolonged cough, and abdominal pain.    Physical Exam  General:  overweight-appearing.   Head:  Normocephalic and atraumatic without obvious abnormalities. No apparent alopecia or balding. Ears:   External ear exam shows no significant lesions or deformities.  Otoscopic examination reveals clear canals, tympanic membranes are intact bilaterally without bulging, retraction, inflammation or discharge. Hearing is grossly normal bilaterally. Nose:  Swollen w/abrasions Mouth:  Oral mucosa and oropharynx without lesions or exudates.  Teeth in good repair. Neck:  Cervical spine is tender to palpation over paraspinal muscles and with the ROM  Lungs:  Normal respiratory effort, chest expands symmetrically. Lungs are clear to auscultation, no crackles or wheezes. Heart:  Normal rate and regular rhythm. S1 and S2 normal without gallop, murmur, click, rub or other extra sounds. Abdomen:  Bowel sounds positive,abdomen soft and non-tender without masses, organomegaly or hernias noted. Msk:  No deformity or scoliosis noted of thoracic or lumbar spine.  Hands w/OA changes Extremities:  No clubbing, cyanosis, edema, or deformity noted with normal full range of motion of all joints.   Neurologic:  Less ataxic gait.  alert & oriented X3, cranial nerves II-XII intact, and DTRs symmetrical and normal.   Skin:  Intact without suspicious lesions or rashes 5th L fingernail torn off, stub discolored Psych:  Cognition and judgment appear intact. Alert and cooperative with normal attention span and concentration. No apparent delusions, illusions, hallucinations   Impression & Recommendations:  Problem # 1:  LACUNAR INFARCTION (ICD-434.91) Assessment Unchanged No new symptoms. Old symptoms improved a lot. Her updated medication list for this problem  includes:    Aspir-low 81 Mg Tbec (Aspirin) .[name]... 1 once daily pc The labs/brain scan  were reviewed with the patient.   Problem # 2:  CEREBROVASCULAR ACCIDENT, HX OF (ICD-V12.50) Assessment: Unchanged Feeling better  Problem # 3:  ATAXIA (ICD-781.3) Assessment: Improved  Problem # 4:  HYPERLIPIDEMIA (ICD-272.4) Assessment: Comment Only  Her updated  medication list for this problem includes:    Pravastatin Sodium 40 Mg Tabs (Pravastatin sodium) ..... One by mouth at bedtime  Labs Reviewed: SGOT: 16 (12/26/2009)   SGPT: 13 (12/26/2009)   HDL:55.10 (02/28/2009), 54.20 (08/31/2008)  LDL:106 (02/28/2009), 97 (91/47/8295)  Chol:175 (02/28/2009), 162 (08/31/2008)  Trig:68.0 (02/28/2009), 53.0 (08/31/2008)  Problem # 5:  ANXIETY (ICD-300.00) Assessment: Unchanged  Her updated medication list for this problem includes:    Diazepam 5 Mg Tabs (Diazepam) .[name]... 1-2 tab 1 h prior to test  Problem # 6:  HYPERTENSION (ICD-401.9) Assessment: Deteriorated  Her updated medication list for this problem includes:    Toprol Xl 50 Mg Xr24h-tab (Metoprolol succinate) .[name]... Take 1 by mouth once daily SHE WAS NOT TAKING IT for ? reson...    Losartan Potassium 100 Mg Tabs (Losartan potassium) .[name]... 1 by mouth once daily for blood pressure Risks of noncompliance with treatment discussed. Compliance encouraged.   Complete Medication List: 1)  Losartan Potassium 100 Mg Tabs (Losartan potassium) .[name].. 1 by mouth once daily for blood pressure 2)  Toprol Xl 50 Mg Xr24h-tab (Metoprolol succinate) .... Take 1 by mouth once daily at night 3)  Pravastatin Sodium 40 Mg Tabs (Pravastatin sodium) .... One by mouth at bedtime 4)  Penlac 8 % Soln (Ciclopirox) .... Qd 5)  Sudafed 30 Mg Tabs (Pseudoephedrine hcl) .... 2 tabs q 4 h as needed 6)  Diphenhydramine Hcl 25 Mg Tabs (Diphenhydramine hcl) .... 2 by mouth q 4 h as needed allergy 7)  Diazepam 5 Mg Tabs (Diazepam) .[name].. 1-2 tab 1 h prior to test 8)  Aspir-low 81 Mg Tbec (Aspirin) .[name].. 1 once daily pc  Other Orders: Flu Vaccine 13yrs + MEDICARE PATIENTS (A2130) Administration Flu vaccine - MCR (Q6578)  Patient Instructions: 1)  Restart Toprol 2)  If BP>145/90 - increse Toprol to two times a day  3)  Please schedule a follow-up appointment in 1-2 weeks. Prescriptions: TOPROL XL 50 MG  XR24H-TAB (METOPROLOL  SUCCINATE) take 1 by mouth once daily at night  #90 x 3   Entered and Authorized by:   Tresa Garter MD   Signed by:   Tresa Garter MD on 02/18/2010   Method used:   Print then Give to Patient   RxID:   [id number] POTASSIUM 100 MG TABS (LOSARTAN POTASSIUM) 1 by mouth once daily for blood pressure  #90 x 3   Entered and Authorized by:   Tresa Garter MD   Signed by:   Tresa Garter MD on 02/18/2010   Method used:   Print then Give to Patient   RxID:   [id number]   Flu Vaccine Consent Questions     Do you have a history of severe allergic reactions to this vaccine? no    Any prior history of allergic reactions to egg and/or gelatin? no    Do you have a sensitivity to the preservative Thimersol? no    Do you have a past history of Guillan-Barre Syndrome? no    Do you currently have an acute febrile illness? no    Have you ever had a severe reaction  to latex? no    Vaccine information given and explained to patient? yes    Are you currently pregnant? no    Lot Number:[id number]   Exp Date:11/23/2010   Site Given  Left Deltoid IM Lanier Prude, [facility])  February 18, 2010 1:46 PM

## 2010-06-28 ENCOUNTER — Other Ambulatory Visit: Payer: Self-pay | Admitting: Internal Medicine

## 2010-06-28 ENCOUNTER — Encounter (INDEPENDENT_AMBULATORY_CARE_PROVIDER_SITE_OTHER): Payer: Self-pay | Admitting: *Deleted

## 2010-06-28 ENCOUNTER — Ambulatory Visit (INDEPENDENT_AMBULATORY_CARE_PROVIDER_SITE_OTHER): Payer: Medicare Other | Admitting: Internal Medicine

## 2010-06-28 ENCOUNTER — Encounter: Payer: Self-pay | Admitting: Internal Medicine

## 2010-06-28 ENCOUNTER — Other Ambulatory Visit: Payer: Medicare Other

## 2010-06-28 DIAGNOSIS — I10 Essential (primary) hypertension: Secondary | ICD-10-CM

## 2010-06-28 DIAGNOSIS — J209 Acute bronchitis, unspecified: Secondary | ICD-10-CM

## 2010-06-28 DIAGNOSIS — Z8679 Personal history of other diseases of the circulatory system: Secondary | ICD-10-CM

## 2010-06-28 DIAGNOSIS — E785 Hyperlipidemia, unspecified: Secondary | ICD-10-CM

## 2010-06-28 HISTORY — DX: Acute bronchitis, unspecified: J20.9

## 2010-06-28 LAB — BASIC METABOLIC PANEL
CO2: 31 mEq/L (ref 19–32)
Calcium: 9.3 mg/dL (ref 8.4–10.5)
Chloride: 105 mEq/L (ref 96–112)
Glucose, Bld: 105 mg/dL — ABNORMAL HIGH (ref 70–99)
Potassium: 4.5 mEq/L (ref 3.5–5.1)
Sodium: 142 mEq/L (ref 135–145)

## 2010-07-03 NOTE — Assessment & Plan Note (Signed)
Summary: Pt's chest congestion and cough have gotten worse/ ab   Vital Signs:  Patient profile:   77 year old female Height:      67 inches Weight:      243 pounds BMI:     38.20 Temp:     99.0 degrees F oral Pulse rate:   88 / minute Pulse rhythm:   regular Resp:     16 per minute BP sitting:   140 / 80  (left arm) Cuff size:   large  Vitals Entered By: Lanier Prude, CMA(AAMA) (June 28, 2010 7:55 AM) CC: cough, congestion,runny nose X 2 days Is Patient Diabetic? No   CC:  cough, congestion, and runny nose X 2 days.  History of Present Illness: The patient presents with complaints of sore throat, fever, cough, sinus congestion and drainge of several days duration. Not better with OTC meds. Chest hurts with coughing. Can't sleep due to cough. Muscle aches are present.  The mucus is colored. F/u HTN, CVA, elev. cholesterol  Current Medications (verified): 1)  Losartan Potassium 100 Mg Tabs (Losartan Potassium) .[name].. 1 By Mouth Once Daily For Blood Pressure 2)  Toprol Xl 50 Mg  Xr24h-Tab (Metoprolol Succinate) .... Take 1 By Mouth Bid 3)  Pravastatin Sodium 40 Mg  Tabs (Pravastatin Sodium) .... One By Mouth At Bedtime 4)  Diphenhydramine Hcl 25 Mg Tabs (Diphenhydramine Hcl) .... 2 By Mouth Q 4 H As Needed Allergy 5)  Amlodipine Besylate 5 Mg Tabs (Amlodipine Besylate) .[name].. 1 By Mouth Qd 6)  Spironolactone 50 Mg Tabs (Spironolactone) .[name].. 1 By Mouth Qam For Blood Pressure 7)  Vitamin D 1000 Unit Tabs (Cholecalciferol) .[name].. 1 By Mouth Qd 8)  Aspir-Low 81 Mg Tbec (Aspirin) .[name].. 1 Once Daily Pc  Allergies (verified): 1)  Hydrochlorothiazide 2)  Alprazolam (Alprazolam)  Past History:  Past Medical History: Last updated: 01/04/2010 Hyperlipidemia Hypertension Osteoarthritis Anxiety Fall with head injury (no LOC) 1/10 Cerebrovascular accident - lacunar 2011 (vertigo, ataxia)  Social History: Last updated: 03/05/2007 Retired Single Never Smoked Alcohol  use-no Regular exercise-no Former Smoker  Physical Exam  General:  overweight-appearing.  Hoarse Mouth:  Erythematous throat and intranasal mucosa c/w URI   Neck:  Cervical spine is tender to palpation over paraspinal muscles and with the ROM  Lungs:  Normal respiratory effort, chest expands symmetrically. Lungs are clear to auscultation, no crackles or wheezes. Heart:  Normal rate and regular rhythm. S1 and S2 normal without gallop, murmur, click, rub or other extra sounds. Abdomen:  Bowel sounds positive,abdomen soft and non-tender without masses, organomegaly or hernias noted. Msk:  No deformity or scoliosis noted of thoracic or lumbar spine.  Hands w/OA changes Neurologic:  Not ataxic gait.  alert & oriented X3, cranial nerves II-XII intact, and DTRs symmetrical and normal.   Skin:  Intact without suspicious lesions or rashes 5th L fingernail torn off, stub discolored Psych:  Cognition and judgment appear intact. Alert and cooperative with normal attention span and concentration. No apparent delusions, illusions, hallucinations   Impression & Recommendations:  Problem # 1:  BRONCHITIS, ACUTE (ICD-466.0) Assessment New  Her updated medication list for this problem includes:    Zithromax Z-pak 250 Mg Tabs (Azithromycin) .[name]... As dirrected    Tessalon Perles 100 Mg Caps (Benzonatate) .[name]... 1-2 by mouth two times a day as needed cogh    Promethazine-codeine 6.25-10 Mg/45ml Syrp (Promethazine-codeine) .[name]... 5-10 ml by mouth q id as needed cough  Problem # 2:  HYPERTENSION (ICD-401.9) Assessment:  Improved  Her updated medication list for this problem includes:    Losartan Potassium 100 Mg Tabs (Losartan potassium) .[name]... 1 by mouth once daily for blood pressure    Toprol Xl 50 Mg Xr24h-tab (Metoprolol succinate) .[name]... Take 1 by mouth bid    Amlodipine Besylate 5 Mg Tabs (Amlodipine besylate) .[name]... 1 by mouth qd    Spironolactone 50 Mg Tabs (Spironolactone) .[name]... 1 by mouth qam for  blood pressure  Problem # 3:  CEREBROVASCULAR ACCIDENT, HX OF (ICD-V12.50) Assessment: Unchanged On the regimen of medicine(s) reflected in the chart    Problem # 4:  HYPERLIPIDEMIA (ICD-272.4) Assessment: Improved  Her updated medication list for this problem includes:    Pravastatin Sodium 40 Mg Tabs (Pravastatin sodium) ..... One by mouth at bedtime Labs pending   Complete Medication List: 1)  Losartan Potassium 100 Mg Tabs (Losartan potassium) .[name].. 1 by mouth once daily for blood pressure 2)  Toprol Xl 50 Mg Xr24h-tab (Metoprolol succinate) .... Take 1 by mouth bid 3)  Pravastatin Sodium 40 Mg Tabs (Pravastatin sodium) .... One by mouth at bedtime 4)  Diphenhydramine Hcl 25 Mg Tabs (Diphenhydramine hcl) .... 2 by mouth q 4 h as needed allergy 5)  Amlodipine Besylate 5 Mg Tabs (Amlodipine besylate) .[name].. 1 by mouth qd 6)  Spironolactone 50 Mg Tabs (Spironolactone) .[name].. 1 by mouth qam for blood pressure 7)  Vitamin D 1000 Unit Tabs (Cholecalciferol) .[name].. 1 by mouth qd 8)  Aspir-low 81 Mg Tbec (Aspirin) .[name].. 1 once daily pc 9)  Zithromax Z-pak 250 Mg Tabs (Azithromycin) .... As dirrected 10)  Tessalon Perles 100 Mg Caps (Benzonatate) .[name].. 1-2 by mouth two times a day as needed cogh 11)  Promethazine-codeine 6.25-10 Mg/62ml Syrp (Promethazine-codeine) .... 5-10 ml by mouth q id as needed cough  Patient Instructions: 1)  Use over-the-counter medicines for "cold": Tylenol  650mg  or Advil 400mg  every 6 hours  for fever; Delsym or Robutussin for cough. Mucinex for congestion. Ricola or Halls for sore throat. Office visit if not better or if worse.  Prescriptions: PROMETHAZINE-CODEINE 6.25-10 MG/5ML SYRP (PROMETHAZINE-CODEINE) 5-10 ml by mouth q id as needed cough  #300 ml x 0   Entered and Authorized by:   Tresa Garter MD   Signed by:   Tresa Garter MD on 06/28/2010   Method used:   Print then Give to Patient   RxID:   [id number] TESSALON PERLES 100 MG CAPS  (BENZONATATE) 1-2 by mouth two times a day as needed cogh  #120 x 1   Entered and Authorized by:   Tresa Garter MD   Signed by:   Tresa Garter MD on 06/28/2010   Method used:   Electronically to        [facility] [phone]* (retail)       [address]       [place], [place]  [address]       Ph: [phone] or [phone]       Fax: [phone]   RxID:   [id number] ZITHROMAX Z-PAK 250 MG TABS (AZITHROMYCIN) as dirrected  #1 x 0   Entered and Authorized by:   Tresa Garter MD   Signed by:   Tresa Garter MD on 06/28/2010   Method used:   Electronically to        [facility] [phone]* (retail)       [address]  [address]       [place], [place]  [address]       Ph: [phone] or [phone]       Fax: [phone]   RxID:   [id number]    Orders Added: 1)  Est. Patient Level IV [27[address]]

## 2010-07-03 NOTE — Progress Notes (Signed)
Summary: OTC MED?  Phone Note Call from Patient Call back at [facility] Phone [phone]   Summary of Call: Pt has sinus congestion & drainage. She wants to know if it is ok to take OTC nyquil w/all the other meds she takes?  Initial call taken by: Lamar Sprinkles, CMA,  June 24, 2010 11:48 AM  Follow-up for Phone Call        [facility] Nyquil two times a day prn Use over-the-counter medicines for "cold": Tylenol  650mg  or Advil 400mg  every 6 hours  for fever; Delsym or Robutussin for cough. Mucinex for congestion. Ricola or Halls for sore throat. Office visit if not better or if worse.  Follow-up by: Tresa Garter MD,  June 24, 2010 6:12 PM  Additional Follow-up for Phone Call Additional follow up Details #1::        Pt informed  Additional Follow-up by: Lamar Sprinkles, CMA,  June 24, 2010 6:52 PM

## 2010-07-16 ENCOUNTER — Other Ambulatory Visit: Payer: Self-pay

## 2010-07-23 ENCOUNTER — Ambulatory Visit: Payer: Self-pay | Admitting: Internal Medicine

## 2010-10-11 NOTE — Assessment & Plan Note (Signed)
[facility]                         GASTROENTEROLOGY OFFICE NOTE   Mindy Taylor, Mindy Taylor                       MRN:          [id number]  DATE:07/06/2006                            DOB:          1928/09/27    CHIEF COMPLAINT:  Polyp followup.   Mindy Taylor was notified for a 3 to 6 month followup flex-sig after a  large polypectomy (tubulovillous adenoma) on April 11, 2004.  She had  a 25 mm sessile polyp in the sigmoid colon.  She elected not to come for  followup.  She has seen Dr. Posey Rea recently and he had her come for a  followup visit.  She has no bowel complaints at this time.  There is no  really new medical history either.   MEDICATIONS:  1. Chlorthalidone 50 mg daily.  2. Metoprolol 50 mg daily.  3. Pravastatin 40 mg daily.  4. Baby aspirin 81 mg daily.  5. Calcium daily.  6. Fish oil daily.  7. Vitamin A, B, C, and D daily.  8. Multivitamin daily.   DRUG ALLERGIES:  None known.   PAST MEDICAL HISTORY:  Hypertension, bronchitis, obesity, hysterectomy,  dyslipidemia, hemorrhoids, colon polyp.   REVIEW OF SYSTEMS:  Positive for joint pain.   She has also had benign breast surgery.   SOCIAL HISTORY:  She is widowed, lives alone, no alcohol, tobacco, or  drugs.   FAMILY HISTORY:  Noncontributory.   PHYSICAL EXAM:  Height 5 feet 7 inches, weight 243 pounds, blood  pressure 102/68.   ASSESSMENT:  Personal history of tubulovillous adenoma of the sigmoid  colon.  She needs a reassessment of this area.  As it is almost 3 years  since the time of her initial colonoscopy, we  will go ahead with a full colonoscopy and inspect this area, and remove  any residual polyp we might see.  Risks, benefits, and indications have  previously been reviewed.   I appreciate the opportunity to care for this patient.     Iva Boop, MD,FACG  Electronically Signed    CEG/MedQ  DD: 07/06/2006  DT: 07/06/2006  Job #: [id number]

## 2010-10-11 NOTE — Discharge Summary (Signed)
NAME:  Mindy Taylor, Mindy Taylor                          ACCOUNT NO.:  [id number]   MEDICAL RECORD NO.:  [id number]                   PATIENT TYPE:  INP   LOCATION:  3709                                 FACILITY:  [facility]   PHYSICIAN:  Rene Paci, M.D. [facility]          DATE OF BIRTH:  [birth year]   DATE OF ADMISSION:  08/17/2003  DATE OF DISCHARGE:  08/18/2003                                 DISCHARGE SUMMARY   DISCHARGE DIAGNOSES:  1. Atypical chest pain.  2. Uncontrolled hypertension.   BRIEF ADMISSION HISTORY:  Mrs. Alamillo is a 77 year old African American  female with tightness of the left chest radiating to the left arm.  This has  been intermittent for a week, not associated with exertion or activity.  Not  associated with nausea, dyspnea or diaphoresis.  She had never had these  symptoms before.  She has no history of coronary disease.  She denies any  cough, any pleuritic pain or reflux symptoms.  Pain improved in the  emergency department without any treatment.  She does describe injury to the  left shoulder in November 2004 with a fall and describes this pain as being  similar.   PAST MEDICAL HISTORY:  1. Hypertension.  2. Hypercholesterolemia.  3. Bilateral knee osteoarthritis.   HOSPITAL COURSE:  Problem #1.  Atypical chest pain.  The patient was  admitted to rule out myocardial infarction secondary to her risk factors of  hypertension, hypercholesterolemia, and family history.  Cardiac enzymes  were negative.  Myocardial infarction was ruled out.  We are attempting to  obtain an adenosine Cardiolite prior to discharge.  If this is negative, she  will be discharged home.  The etiology of her atypical pain may be her  osteoarthritis in her left shoulder secondary to an injury there.  At this  time she is pain-free.   Problem #2.  Hypertension.  This was uncontrolled.  Her blood pressure was  177/83.  The patient admits to poor compliance with her medication.  We have  changed  her labetalol to Toprol XL secondary to the ease of once daily  dosing.   Problem #3.  Hypercholesterolemia.  The patient is on a statin.  We do have  a fasting lipid profile pending.   DISCHARGE LABORATORY DATA:  TSH, fasting lipid profile, alpha-I  anti__________ and __________are both pending.  BMET and serial cardiac  enzymes were normal.   DISCHARGE MEDICATIONS:  1. Pravachol 20 mg daily.  2. Aspirin 81 mg daily.  3. Toprol XL 50 mg daily.   FOLLOW UP:  Follow up with Dr. Posey Rea in one to two weeks.      Cornell Barman, P.A. [facility]                  Rene Paci, M.D. [facility]    LC/MEDQ  D:  08/18/2003  T:  08/19/2003  Job:  [id number]   cc:   Macarthur Critchley  Buckner Malta, M.D. [facility]

## 2010-11-12 ENCOUNTER — Telehealth: Payer: Self-pay

## 2010-11-12 MED ORDER — PRAVASTATIN SODIUM 80 MG PO TABS: 80.0000 mg | ORAL_TABLET | Freq: Every evening | ORAL | Status: DC

## 2010-11-12 NOTE — Telephone Encounter (Signed)
Patient lmovm requesting refill on cholesterol med. Rx sent and pt notified

## 2010-11-21 ENCOUNTER — Telehealth: Payer: Self-pay

## 2010-11-21 NOTE — Telephone Encounter (Signed)
Patient previously called to request refill for pravastatin. She lmovm today stating that she received pravastatin 80 mg (90 day supply) but takes only 40 mg. After reviewing EMR, pravastatin 40 mg is the correct dosage and refill was entered incorrectly .  Patient notified ok to take 1/2 tab daily and 37mo supply will last for 6mos instead. Medication section of chart noted.

## 2010-11-25 ENCOUNTER — Telehealth: Payer: Self-pay | Admitting: *Deleted

## 2010-11-25 MED ORDER — PROMETHAZINE-CODEINE 6.25-10 MG/5ML PO SYRP: 5.0000 mL | ORAL_SOLUTION | ORAL | Status: AC | PRN

## 2010-11-25 NOTE — Telephone Encounter (Signed)
OK Prom-Cod. OV if not better Thx

## 2010-11-25 NOTE — Telephone Encounter (Signed)
Pt c/o "deep" productive cough & Chest congestion w/thick discolored mucus, fever and body aches. She is req RX from MD. OTC "cold" med has given little relief.

## 2010-11-25 NOTE — Telephone Encounter (Signed)
Agree. Thx 

## 2010-11-26 NOTE — Telephone Encounter (Signed)
RX faxed to pharmacy, Patient notified

## 2010-11-28 ENCOUNTER — Ambulatory Visit (INDEPENDENT_AMBULATORY_CARE_PROVIDER_SITE_OTHER): Payer: Medicare Other | Admitting: Internal Medicine

## 2010-11-28 ENCOUNTER — Telehealth: Payer: Self-pay | Admitting: *Deleted

## 2010-11-28 ENCOUNTER — Encounter: Payer: Self-pay | Admitting: Internal Medicine

## 2010-11-28 VITALS — BP 132/72 | HR 76 | Temp 99.1°F | Ht 67.0 in | Wt 237.0 lb

## 2010-11-28 DIAGNOSIS — R062 Wheezing: Secondary | ICD-10-CM

## 2010-11-28 DIAGNOSIS — J209 Acute bronchitis, unspecified: Secondary | ICD-10-CM | POA: Insufficient documentation

## 2010-11-28 DIAGNOSIS — I10 Essential (primary) hypertension: Secondary | ICD-10-CM

## 2010-11-28 DIAGNOSIS — Z0000 Encounter for general adult medical examination without abnormal findings: Secondary | ICD-10-CM | POA: Insufficient documentation

## 2010-11-28 DIAGNOSIS — F411 Generalized anxiety disorder: Secondary | ICD-10-CM

## 2010-11-28 MED ORDER — PREDNISONE 10 MG PO TABS: 20.0000 mg | ORAL_TABLET | Freq: Every day | ORAL | Status: AC

## 2010-11-28 MED ORDER — LEVOFLOXACIN 250 MG PO TABS: 250.0000 mg | ORAL_TABLET | Freq: Every day | ORAL | Status: AC

## 2010-11-28 MED ORDER — CEFTRIAXONE SODIUM 1 G IJ SOLR
1.0000 g | Freq: Once | INTRAMUSCULAR | Status: AC
  Administered 2010-11-28: 1 g via INTRAMUSCULAR

## 2010-11-28 NOTE — Telephone Encounter (Signed)
OV w/any MD Thx 

## 2010-11-28 NOTE — Assessment & Plan Note (Signed)
stable overall by hx and exam, and pt to continue medical treatment as before 

## 2010-11-28 NOTE — Assessment & Plan Note (Signed)
Moderate overall it seems with freq cough despite the phenergan with codeine, cant r/o pna but she declines cxr; for rocephin IM today, and antibx course for home

## 2010-11-28 NOTE — Progress Notes (Signed)
Subjective:    Patient ID: Mindy Taylor, female    DOB: [birth year], 77 y.o.   MRN: [id number]  HPI  Here with acute onset mild to mod 2-3 days ST, HA, general weakness and malaise, with prod cough greenish sputum, but Pt denies chest pain, increased sob or doe, orthopnea, PND, increased LE swelling, palpitations, dizziness or syncope, but with mild wheezing onset today.  Pt denies new neurological symptoms such as new headache, or facial or extremity weakness or numbness   Pt denies polydipsia, polyuria. Overall quite healthy for her age, no hx of pna or other recent acute illness.  Overall good compliance with treatment, and good medicine tolerability.  Denies worsening depressive symptoms, suicidal ideation, or panic, though has ongoing anxiety, not increased recently.  Past Medical History  Diagnosis Date  . ANXIETY 03/05/2007  . ATAXIA 12/26/2009  . BEE STING 03/05/2007  . BRONCHITIS, ACUTE 06/28/2010  . CANDIDIASIS, VAGINAL 09/08/2008  . CEREBROVASCULAR ACCIDENT, HX OF 01/04/2010  . CERUMEN IMPACTION 06/05/2008  . DIZZINESS 12/26/2009  . Epistaxis 06/05/2008  . Headache 06/05/2008  . HYPERLIPIDEMIA 12/08/2006  . HYPERTENSION 12/08/2006  . 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 NECK PAIN 06/05/2008  . OBESITY 09/07/2009  . ONYCHOMYCOSIS 03/09/2009  . OSTEOARTHRITIS 12/08/2006  . PARESTHESIA 12/26/2009  . TOBACCO USE, QUIT 03/09/2009   No past surgical history on file.  reports that she has never smoked. She does not have any smokeless tobacco history on file. She reports that she does not drink alcohol or use illicit drugs. family history includes Hypertension in her other. Allergies  Allergen Reactions  . Alprazolam     REACTION: intol.  . Hydrochlorothiazide     REACTION: cramps   Current Outpatient Prescriptions on File Prior to Visit  Medication Sig Dispense Refill  . pravastatin (PRAVACHOL) 80 MG tablet Take 80 mg by mouth every evening. (patient takes 1/2 tab daily)       .  promethazine-codeine (PHENERGAN WITH CODEINE) 6.25-10 MG/5ML syrup Take 5 mLs by mouth every 4 (four) hours as needed for cough.  240 mL  0  . DISCONTD: pravastatin (PRAVACHOL) 80 MG tablet Take 1 tablet (80 mg total) by mouth every evening.  90 tablet  3   No current facility-administered medications on file prior to visit.   Review of Systems Review of Systems  Constitutional: Negative for diaphoresis and unexpected weight change.  HENT: Negative for drooling and tinnitus.   Eyes: Negative for photophobia and visual disturbance.  Respiratory: Negative for choking and stridor.   Gastrointestinal: Negative for vomiting and blood in stool.  Genitourinary: Negative for hematuria and decreased urine volume.       Objective:   Physical Exam BP 132/72  Pulse 76  Temp(Src) 99.1 F (37.3 C) (Oral)  Ht 5\' 7"  (1.702 m)  Wt 237 lb (107.502 kg)  BMI 37.12 kg/m2  SpO2 98% Physical Exam  VS noted, mild ill  Constitutional: Pt appears well-developed and well-nourished.  HENT: Head: Normocephalic.  Right Ear: External ear normal.  Left Ear: External ear normal. \ Bilat tm's mild erythema.  Sinus nontender.  Pharynx mild erythema Eyes: Conjunctivae and EOM are normal. Pupils are equal, round, and reactive to light.  Neck: Normal range of motion. Neck supple.  Cardiovascular: Normal rate and regular rhythm.   Pulmonary/Chest: Effort normal and breath sounds decreased with mild wheeze.  Neurological: Pt is alert. No cranial nerve deficit.  Skin: Skin is warm. No erythema.  Psychiatric: Pt behavior is normal. Thought  content normal. 1+ nervous        Assessment & Plan:

## 2010-11-28 NOTE — Assessment & Plan Note (Signed)
stable overall by hx and exam, most recent data reviewed with pt, and pt to continue medical treatment as before  BP Readings from Last 3 Encounters:  11/28/10 132/72  06/28/10 140/80  04/23/10 160/80

## 2010-11-28 NOTE — Telephone Encounter (Signed)
Pt states that cough medicine sent to pharmacy on 11/26/10 is not working and cold now seems to be in chest [this was also reported in the 11/25/10 note] and she is now wheezing. Pt is requesting another Rx. Should Pt come in for OV.?

## 2010-11-28 NOTE — Telephone Encounter (Signed)
Scheduled OV at 4:15pm w/Dr Jonny Ruiz

## 2010-11-28 NOTE — Patient Instructions (Signed)
You had the antibiotic shot today (rocephin) Take all new medications as prescribed - the antibiotic pills, and prednisone Continue all other medications as before - including the cough medicine You can also take Mucinex (or it's generic off brand) for congestion, and tylenol for pain

## 2010-11-28 NOTE — Assessment & Plan Note (Signed)
With mild sob, likely related to acute infection;  For predpack for home, cont cough med, and add mucinex otc prn

## 2010-12-11 ENCOUNTER — Encounter: Payer: Self-pay | Admitting: Internal Medicine

## 2010-12-11 ENCOUNTER — Telehealth: Payer: Self-pay | Admitting: Internal Medicine

## 2010-12-11 ENCOUNTER — Ambulatory Visit (INDEPENDENT_AMBULATORY_CARE_PROVIDER_SITE_OTHER): Payer: Medicare Other | Admitting: Internal Medicine

## 2010-12-11 ENCOUNTER — Ambulatory Visit (INDEPENDENT_AMBULATORY_CARE_PROVIDER_SITE_OTHER)
Admission: RE | Admit: 2010-12-11 | Discharge: 2010-12-11 | Disposition: A | Discharge status: Home / Self Care | Payer: Medicare Other | Source: Ambulatory Visit | Attending: Internal Medicine | Admitting: Internal Medicine

## 2010-12-11 ENCOUNTER — Telehealth: Payer: Self-pay

## 2010-12-11 DIAGNOSIS — R062 Wheezing: Secondary | ICD-10-CM

## 2010-12-11 DIAGNOSIS — J209 Acute bronchitis, unspecified: Secondary | ICD-10-CM

## 2010-12-11 MED ORDER — METHYLPREDNISOLONE ACETATE 80 MG/ML IJ SUSP
120.0000 mg | Freq: Once | INTRAMUSCULAR | Status: AC
  Administered 2010-12-11: 120 mg via INTRAMUSCULAR

## 2010-12-11 MED ORDER — AZITHROMYCIN 250 MG PO TABS: 250.0000 mg | ORAL_TABLET | Freq: Every day | ORAL | Status: AC

## 2010-12-11 MED ORDER — TIOTROPIUM BROMIDE MONOHYDRATE 18 MCG IN CAPS: 18.0000 ug | ORAL_CAPSULE | Freq: Every day | RESPIRATORY_TRACT | Status: DC

## 2010-12-11 MED ORDER — MOMETASONE FURO-FORMOTEROL FUM 200-5 MCG/ACT IN AERO: 2.0000 | INHALATION_SPRAY | Freq: Two times a day (BID) | RESPIRATORY_TRACT | Status: DC

## 2010-12-11 NOTE — Telephone Encounter (Signed)
Stacey , please, inform the patient: CXR was ok   Please, keep  next office visit appointment.   Thank you !   

## 2010-12-11 NOTE — Assessment & Plan Note (Signed)
Start Zpac 

## 2010-12-11 NOTE — Telephone Encounter (Signed)
Pt c/o current tx for bronchitis not working and she would like something else. Continued cough and wheezing.   Appointment scheduled

## 2010-12-11 NOTE — Assessment & Plan Note (Signed)
Start MDI

## 2010-12-12 NOTE — Telephone Encounter (Signed)
Pt informed

## 2010-12-26 NOTE — Progress Notes (Signed)
  Subjective:    Patient ID: Mindy Taylor, female    DOB: [birth year], 77 y.o.   MRN: [id number]  HPI   HPI  C/o URI sx's x   days. C/o ST, cough, weakness. Not better with OTC medicines. Actually, the patient is getting worse. The patient did not sleep last night due to cough.  Review of Systems  Constitutional: Positive for fever, chills and fatigue.  HENT: Positive for congestion, rhinorrhea, sneezing and postnasal drip.   Eyes: Positive for photophobia and pain. Negative for discharge and visual disturbance.  Respiratory: Positive for cough and wheezing.   Positive for chest pain.  Gastrointestinal: Negative for vomiting, abdominal pain, diarrhea and abdominal distention.  Genitourinary: Negative for dysuria and difficulty urinating.  Skin: Negative for rash.  Neurological: Positive for dizziness, weakness and light-headedness.      Review of Systems  Constitutional: Negative for chills, activity change, appetite change, fatigue and unexpected weight change.  HENT: Negative for congestion, mouth sores and sinus pressure.   Eyes: Negative for visual disturbance.  Respiratory: Positive for wheezing. Negative for cough and chest tightness.   Cardiovascular: Positive for chest pain. Negative for leg swelling.  Gastrointestinal: Negative for nausea and abdominal pain.  Genitourinary: Negative for frequency, difficulty urinating and vaginal pain.  Musculoskeletal: Negative for back pain and gait problem.  Skin: Negative for pallor and rash.  Neurological: Negative for dizziness, tremors, weakness, numbness and headaches.  Psychiatric/Behavioral: Negative for confusion and sleep disturbance.       Objective:   Physical Exam  Constitutional: She appears well-developed. No distress.       obese  HENT:  Head: Normocephalic.  Right Ear: External ear normal.  Left Ear: External ear normal.  Nose: Nose normal.  Mouth/Throat: Oropharynx is clear and moist.  Eyes: Conjunctivae are  normal. Pupils are equal, round, and reactive to light. Right eye exhibits no discharge. Left eye exhibits no discharge.  Neck: Normal range of motion. Neck supple. No JVD present. No tracheal deviation present. No thyromegaly present.  Cardiovascular: Normal rate, regular rhythm and normal heart sounds.   Pulmonary/Chest: No stridor. No respiratory distress. She has wheezes. She has no rales.  Abdominal: Soft. Bowel sounds are normal. She exhibits no distension and no mass. There is no tenderness. There is no rebound and no guarding.  Musculoskeletal: She exhibits no edema and no tenderness.  Lymphadenopathy:    She has no cervical adenopathy.  Neurological: She displays normal reflexes. No cranial nerve deficit. She exhibits normal muscle tone. Coordination normal.  Skin: No rash noted. No erythema.  Psychiatric: She has a normal mood and affect. Her behavior is normal. Judgment and thought content normal.          Assessment & Plan:

## 2010-12-27 ENCOUNTER — Ambulatory Visit: Admitting: Internal Medicine

## 2011-01-03 ENCOUNTER — Encounter: Payer: Self-pay | Admitting: Internal Medicine

## 2011-01-03 ENCOUNTER — Ambulatory Visit (INDEPENDENT_AMBULATORY_CARE_PROVIDER_SITE_OTHER): Payer: Medicare Other | Admitting: Internal Medicine

## 2011-01-03 ENCOUNTER — Other Ambulatory Visit (INDEPENDENT_AMBULATORY_CARE_PROVIDER_SITE_OTHER): Payer: Medicare Other

## 2011-01-03 DIAGNOSIS — R5381 Other malaise: Secondary | ICD-10-CM

## 2011-01-03 DIAGNOSIS — I10 Essential (primary) hypertension: Secondary | ICD-10-CM

## 2011-01-03 DIAGNOSIS — E785 Hyperlipidemia, unspecified: Secondary | ICD-10-CM

## 2011-01-03 DIAGNOSIS — R202 Paresthesia of skin: Secondary | ICD-10-CM

## 2011-01-03 DIAGNOSIS — R5383 Other fatigue: Secondary | ICD-10-CM

## 2011-01-03 DIAGNOSIS — R209 Unspecified disturbances of skin sensation: Secondary | ICD-10-CM

## 2011-01-03 DIAGNOSIS — R51 Headache: Secondary | ICD-10-CM

## 2011-01-03 LAB — COMPREHENSIVE METABOLIC PANEL
ALT: 25 U/L (ref 0–35)
AST: 23 U/L (ref 0–37)
Alkaline Phosphatase: 74 U/L (ref 39–117)
BUN: 24 mg/dL — ABNORMAL HIGH (ref 6–23)
Calcium: 9.5 mg/dL (ref 8.4–10.5)
Chloride: 106 mEq/L (ref 96–112)
Creatinine, Ser: 1.1 mg/dL (ref 0.4–1.2)
Potassium: 4.4 mEq/L (ref 3.5–5.1)

## 2011-01-03 LAB — CBC WITH DIFFERENTIAL/PLATELET
Basophils Absolute: 0 10*3/uL (ref 0.0–0.1)
Basophils Relative: 0.4 % (ref 0.0–3.0)
Eosinophils Absolute: 0.1 10*3/uL (ref 0.0–0.7)
Lymphocytes Relative: 27.5 % (ref 12.0–46.0)
MCHC: 33 g/dL (ref 30.0–36.0)
MCV: 91.1 fl (ref 78.0–100.0)
Monocytes Absolute: 0.5 10*3/uL (ref 0.1–1.0)
Neutro Abs: 5.1 10*3/uL (ref 1.4–7.7)
Neutrophils Relative %: 64.2 % (ref 43.0–77.0)
RDW: 14.3 % (ref 11.5–14.6)

## 2011-01-03 LAB — URINALYSIS
Bilirubin Urine: NEGATIVE
Hgb urine dipstick: NEGATIVE
Ketones, ur: NEGATIVE
Leukocytes, UA: NEGATIVE
Nitrite: NEGATIVE

## 2011-01-03 LAB — SEDIMENTATION RATE: Sed Rate: 44 mm/hr — ABNORMAL HIGH (ref 0–22)

## 2011-01-03 MED ORDER — VITAMIN D 1000 UNITS PO CAPS: 1000.0000 [IU] | ORAL_CAPSULE | Freq: Every day | ORAL | Status: DC

## 2011-01-03 NOTE — Patient Instructions (Signed)
Try to take Amlodipine at night

## 2011-01-03 NOTE — Assessment & Plan Note (Signed)
Check labs 

## 2011-01-03 NOTE — Assessment & Plan Note (Signed)
On Rx 

## 2011-01-03 NOTE — Progress Notes (Signed)
  Subjective:    Patient ID: Mindy Taylor, female    DOB: 04-27-1929, 77 y.o.   MRN: [id number]  HPI The patient presents for a follow-up of  chronic hypertension, chronic dyslipidemia controlled with medicines C/o fatigue C/o HAs    Review of Systems  Constitutional: Positive for fatigue. Negative for fever, chills, activity change, appetite change and unexpected weight change.  HENT: Negative for congestion, mouth sores and sinus pressure.   Eyes: Negative for visual disturbance.  Respiratory: Negative for cough and chest tightness.   Gastrointestinal: Negative for nausea and abdominal pain.  Genitourinary: Negative for frequency, difficulty urinating and vaginal pain.  Musculoskeletal: Negative for back pain and gait problem.  Skin: Negative for pallor and rash.  Neurological: Positive for light-headedness and headaches. Negative for dizziness, tremors, weakness and numbness.  Psychiatric/Behavioral: Negative for confusion and sleep disturbance.       Objective:   Physical Exam  Constitutional: She appears well-developed. No distress.       Obese  HENT:  Head: Normocephalic.  Right Ear: External ear normal.  Left Ear: External ear normal.  Nose: Nose normal.  Mouth/Throat: Oropharynx is clear and moist.  Eyes: Conjunctivae are normal. Pupils are equal, round, and reactive to light. Right eye exhibits no discharge. Left eye exhibits no discharge.  Neck: Normal range of motion. Neck supple. No JVD present. No tracheal deviation present. No thyromegaly present.  Cardiovascular: Normal rate, regular rhythm and normal heart sounds.   Pulmonary/Chest: No stridor. No respiratory distress. She has no wheezes.  Abdominal: Soft. Bowel sounds are normal. She exhibits no distension and no mass. There is no tenderness. There is no rebound and no guarding.  Musculoskeletal: She exhibits no edema and no tenderness.  Lymphadenopathy:    She has no cervical adenopathy.  Neurological: She  displays normal reflexes. No cranial nerve deficit. She exhibits normal muscle tone. Coordination normal.  Skin: No rash noted. No erythema.  Psychiatric: She has a normal mood and affect. Her behavior is normal. Judgment and thought content normal.          Assessment & Plan:    A complex case. Previous tests reviewed. Labs ordered.

## 2011-01-03 NOTE — Assessment & Plan Note (Signed)
Chronic sx's - not better; a vague discomfort

## 2011-01-05 ENCOUNTER — Telehealth: Payer: Self-pay | Admitting: Internal Medicine

## 2011-01-05 NOTE — Telephone Encounter (Signed)
Mindy Taylor , please, inform the patient: labs are OK  Thank you !   

## 2011-01-06 NOTE — Telephone Encounter (Signed)
Left detailed mess informing pt of below.  

## 2011-02-17 ENCOUNTER — Telehealth: Payer: Self-pay | Admitting: *Deleted

## 2011-02-17 NOTE — Telephone Encounter (Signed)
Spoke w/patient, she c/o increasing knee pain x one week, wanted referral to "bone doctor". Scheduled patient for OV tomorrow for eval.

## 2011-02-18 ENCOUNTER — Encounter: Payer: Self-pay | Admitting: Internal Medicine

## 2011-02-18 ENCOUNTER — Ambulatory Visit (INDEPENDENT_AMBULATORY_CARE_PROVIDER_SITE_OTHER): Payer: Medicare Other | Admitting: Internal Medicine

## 2011-02-18 VITALS — BP 130/90 | HR 84 | Temp 99.3°F | Resp 16 | Wt 245.0 lb

## 2011-02-18 DIAGNOSIS — I10 Essential (primary) hypertension: Secondary | ICD-10-CM

## 2011-02-18 DIAGNOSIS — E669 Obesity, unspecified: Secondary | ICD-10-CM

## 2011-02-18 DIAGNOSIS — Z23 Encounter for immunization: Secondary | ICD-10-CM

## 2011-02-18 DIAGNOSIS — E785 Hyperlipidemia, unspecified: Secondary | ICD-10-CM

## 2011-02-18 DIAGNOSIS — M25569 Pain in unspecified knee: Secondary | ICD-10-CM

## 2011-02-18 DIAGNOSIS — M25561 Pain in right knee: Secondary | ICD-10-CM

## 2011-02-18 DIAGNOSIS — M25562 Pain in left knee: Secondary | ICD-10-CM | POA: Insufficient documentation

## 2011-02-18 MED ORDER — IBUPROFEN 600 MG PO TABS: ORAL_TABLET | ORAL | Status: DC

## 2011-02-18 MED ORDER — TRAMADOL HCL 50 MG PO TABS: 50.0000 mg | ORAL_TABLET | Freq: Two times a day (BID) | ORAL | Status: AC | PRN

## 2011-02-18 MED ORDER — METHYLPREDNISOLONE ACETATE 80 MG/ML IJ SUSP: 80.0000 mg | Freq: Once | INTRAMUSCULAR | Status: DC

## 2011-02-18 MED ORDER — IBUPROFEN 600 MG PO TABS: ORAL_TABLET | ORAL | Status: AC

## 2011-02-18 NOTE — Assessment & Plan Note (Signed)
Wt Readings from Last 3 Encounters:  02/18/11 245 lb (111.131 kg)  01/03/11 235 lb (106.595 kg)  12/11/10 239 lb (108.41 kg)

## 2011-02-18 NOTE — Progress Notes (Signed)
  Subjective:    Patient ID: Mindy Taylor, female    DOB: 1928-12-24, 77 y.o.   MRN: [id number]  HPI  The patient presents for a follow-up of  chronic hypertension C/o B knee pain x weeks She was tired and stopped Spironolactone  Review of Systems  Constitutional: Negative for chills, activity change, appetite change, fatigue and unexpected weight change.  HENT: Negative for congestion, mouth sores and sinus pressure.   Eyes: Negative for visual disturbance.  Respiratory: Negative for cough and chest tightness.   Gastrointestinal: Negative for nausea and abdominal pain.  Genitourinary: Negative for frequency, difficulty urinating and vaginal pain.  Musculoskeletal: Positive for arthralgias and gait problem. Negative for back pain.  Skin: Negative for pallor and rash.  Neurological: Negative for dizziness, tremors, weakness, numbness and headaches.  Psychiatric/Behavioral: Negative for confusion and sleep disturbance.       Objective:   Physical Exam  Constitutional: She appears well-developed. No distress.       Obese  HENT:  Head: Normocephalic.  Right Ear: External ear normal.  Left Ear: External ear normal.  Nose: Nose normal.  Mouth/Throat: Oropharynx is clear and moist.  Eyes: Conjunctivae are normal. Pupils are equal, round, and reactive to light. Right eye exhibits no discharge. Left eye exhibits no discharge.  Neck: Normal range of motion. Neck supple. No JVD present. No tracheal deviation present. No thyromegaly present.  Cardiovascular: Normal rate, regular rhythm and normal heart sounds.   Pulmonary/Chest: No stridor. No respiratory distress. She has no wheezes.  Abdominal: Soft. Bowel sounds are normal. She exhibits no distension and no mass. There is no tenderness. There is no rebound and no guarding.  Musculoskeletal: She exhibits tenderness (B knees w/OA def and most tender B. anserina areas). She exhibits no edema.  Lymphadenopathy:    She has no cervical  adenopathy.  Neurological: She displays normal reflexes. No cranial nerve deficit. She exhibits normal muscle tone. Coordination normal.  Skin: No rash noted. No erythema.  Psychiatric: She has a normal mood and affect. Her behavior is normal. Judgment and thought content normal.      Procedure Note :    Procedure :Joint Injection,  Knee bursa anserina B   Indication: Bursitis B with refractory  chronic pain.   Risks including unsuccessful procedure , bleeding, infection, bruising, skin atrophy and others were explained to the patient in detail as well as the benefits. Informed consent was obtained and signed.   Tthe patient was placed in a comfortable position. Skin was prepped with Betadine and alcohol  and anesthetized with a cooling spray. Then, a 3 cc syringe with a 1.5 inch long 25-gauge needle was used for a bursa injection in a fan-like fasion with 2 mL of 2% lidocaine and 40 mg of Depo-Medrol each.  Band-Aid was applied.   Tolerated well. Complications: None. Good pain relief following the procedure.   Postprocedure instructions :    A Band-Aid should be left on for 12 hours. Injection therapy is not a cure itself. It is used in conjunction with other modalities. You can use nonsteroidal anti-inflammatories like ibuprofen , hot and cold compresses. Rest is recommended in the next 24 hours. You need to report immediately  if fever, chills or any signs of infection develop.      Assessment & Plan:

## 2011-02-18 NOTE — Assessment & Plan Note (Signed)
We will inject B bursas and inject knee joints if not better in 2 weeks

## 2011-02-18 NOTE — Patient Instructions (Signed)
Postprocedure instructions :    A Band-Aid should be left on for 12 hours. Injection therapy is not a cure itself. It is used in conjunction with other modalities. You can use nonsteroidal anti-inflammatories like ibuprofen , hot and cold compresses. Rest is recommended in the next 24 hours. You need to report immediately  if fever, chills or any signs of infection develop. 

## 2011-02-18 NOTE — Assessment & Plan Note (Signed)
Continue with current prescription therapy as reflected on the Med list.  

## 2011-02-18 NOTE — Assessment & Plan Note (Signed)
She stopped Spironolactone

## 2011-02-19 ENCOUNTER — Telehealth: Payer: Self-pay | Admitting: *Deleted

## 2011-02-19 NOTE — Telephone Encounter (Signed)
Pt left VM req a call this afternoon to discuss her medication.

## 2011-02-21 MED ORDER — LOSARTAN POTASSIUM 100 MG PO TABS: 100.0000 mg | ORAL_TABLET | Freq: Every day | ORAL | Status: DC

## 2011-02-21 MED ORDER — AMLODIPINE BESYLATE 5 MG PO TABS: 5.0000 mg | ORAL_TABLET | Freq: Every day | ORAL | Status: DC

## 2011-02-21 MED ORDER — METOPROLOL SUCCINATE ER 50 MG PO TB24: 50.0000 mg | ORAL_TABLET | Freq: Two times a day (BID) | ORAL | Status: DC

## 2011-02-21 NOTE — Telephone Encounter (Signed)
Needs Rf Losartan, Amlodipine and Metoprolol

## 2011-04-04 ENCOUNTER — Emergency Department (INDEPENDENT_AMBULATORY_CARE_PROVIDER_SITE_OTHER): Payer: Medicare Other

## 2011-04-04 ENCOUNTER — Emergency Department (HOSPITAL_BASED_OUTPATIENT_CLINIC_OR_DEPARTMENT_OTHER)
Admission: EM | Admit: 2011-04-04 | Discharge: 2011-04-04 | Disposition: A | Discharge status: Home / Self Care | Payer: Medicare Other | Attending: Emergency Medicine | Admitting: Emergency Medicine

## 2011-04-04 ENCOUNTER — Encounter (HOSPITAL_BASED_OUTPATIENT_CLINIC_OR_DEPARTMENT_OTHER): Payer: Self-pay | Admitting: *Deleted

## 2011-04-04 DIAGNOSIS — R51 Headache: Secondary | ICD-10-CM | POA: Insufficient documentation

## 2011-04-04 DIAGNOSIS — Z8679 Personal history of other diseases of the circulatory system: Secondary | ICD-10-CM | POA: Insufficient documentation

## 2011-04-04 DIAGNOSIS — M47812 Spondylosis without myelopathy or radiculopathy, cervical region: Secondary | ICD-10-CM

## 2011-04-04 DIAGNOSIS — I6529 Occlusion and stenosis of unspecified carotid artery: Secondary | ICD-10-CM

## 2011-04-04 DIAGNOSIS — M62838 Other muscle spasm: Secondary | ICD-10-CM | POA: Insufficient documentation

## 2011-04-04 DIAGNOSIS — Z79899 Other long term (current) drug therapy: Secondary | ICD-10-CM | POA: Insufficient documentation

## 2011-04-04 DIAGNOSIS — E785 Hyperlipidemia, unspecified: Secondary | ICD-10-CM | POA: Insufficient documentation

## 2011-04-04 DIAGNOSIS — I10 Essential (primary) hypertension: Secondary | ICD-10-CM | POA: Insufficient documentation

## 2011-04-04 LAB — BASIC METABOLIC PANEL
Chloride: 101 mEq/L (ref 96–112)
GFR calc Af Amer: 79 mL/min — ABNORMAL LOW (ref >90)
Potassium: 3.7 mEq/L (ref 3.5–5.1)

## 2011-04-04 MED ORDER — IOHEXOL 350 MG/ML SOLN
100.0000 mL | Freq: Once | INTRAVENOUS | Status: AC | PRN
  Administered 2011-04-04: 100 mL via INTRAVENOUS

## 2011-04-04 MED ORDER — HYDROMORPHONE HCL PF 1 MG/ML IJ SOLN
INTRAMUSCULAR | Status: AC
  Administered 2011-04-04: 0.5 mg via INTRAVENOUS
  Filled 2011-04-04: qty 1

## 2011-04-04 MED ORDER — HYDROMORPHONE HCL PF 1 MG/ML IJ SOLN
0.5000 mg | Freq: Once | INTRAMUSCULAR | Status: AC
  Administered 2011-04-04: 0.5 mg via INTRAVENOUS

## 2011-04-04 MED ORDER — OXYCODONE-ACETAMINOPHEN 5-325 MG PO TABS
1.0000 | ORAL_TABLET | Freq: Once | ORAL | Status: AC
  Administered 2011-04-04: 1 via ORAL
  Filled 2011-04-04: qty 1

## 2011-04-04 MED ORDER — DIAZEPAM 5 MG PO TABS: 5.0000 mg | ORAL_TABLET | Freq: Two times a day (BID) | ORAL | Status: AC

## 2011-04-04 MED ORDER — OXYCODONE-ACETAMINOPHEN 5-325 MG PO TABS: 1.0000 | ORAL_TABLET | Freq: Four times a day (QID) | ORAL | Status: AC | PRN

## 2011-04-04 MED ORDER — HYDROMORPHONE HCL PF 1 MG/ML IJ SOLN: 0.5000 mg | Freq: Once | INTRAMUSCULAR | Status: DC

## 2011-04-04 MED ORDER — DIAZEPAM 5 MG PO TABS
5.0000 mg | ORAL_TABLET | Freq: Once | ORAL | Status: AC
  Administered 2011-04-04: 5 mg via ORAL
  Filled 2011-04-04: qty 1

## 2011-04-04 NOTE — ED Notes (Signed)
Pt c/o headache radiating down neck and into shoulder since Tuesday.

## 2011-04-04 NOTE — ED Notes (Signed)
Patient requested soft collar to help support her head and neck.  Patient reports that it is "too much right now" but requested to take the collar home and attempt to wear it later.

## 2011-04-05 NOTE — ED Provider Notes (Signed)
History     CSN: [id number] Arrival date & time: 04/04/2011  4:14 PM   First MD Initiated Contact with Patient 04/04/11 1622      Chief Complaint  Patient presents with  . Headache    (Consider location/radiation/quality/duration/timing/severity/associated sxs/prior treatment) Patient is a 77 y.o. female presenting with headaches.  Headache  This is a recurrent problem. The current episode started more than 2 days ago. The problem occurs constantly. The problem has been gradually worsening. The headache is associated with nothing. The pain is located in the bilateral and occipital region. The quality of the pain is described as sharp and throbbing. The pain is at a severity of 9/10. The pain is severe. The pain radiates to the left neck, right shoulder, left shoulder and right neck. Pertinent negatives include no anorexia, no fever, no near-syncope, no shortness of breath, no nausea and no vomiting. She has tried acetaminophen and cold packs for the symptoms. The treatment provided no relief.    Past Medical History  Diagnosis Date  . ANXIETY 03/05/2007  . ATAXIA 12/26/2009  . BEE STING 03/05/2007  . BRONCHITIS, ACUTE 06/28/2010  . CANDIDIASIS, VAGINAL 09/08/2008  . CEREBROVASCULAR ACCIDENT, HX OF 01/04/2010  . CERUMEN IMPACTION 06/05/2008  . DIZZINESS 12/26/2009  . Epistaxis 06/05/2008  . Headache 06/05/2008  . HYPERLIPIDEMIA 12/08/2006  . HYPERTENSION 12/08/2006  . 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 NECK PAIN 06/05/2008  . OBESITY 09/07/2009  . ONYCHOMYCOSIS 03/09/2009  . OSTEOARTHRITIS 12/08/2006  . PARESTHESIA 12/26/2009  . TOBACCO USE, QUIT 03/09/2009    Past Surgical History  Procedure Date  . Breast surgery   . Abdominal hysterectomy     Family History  Problem Relation Age of Onset  . Hypertension Other   . Hypertension Mother   . Hypertension Father     History  Substance Use Topics  . Smoking status: Former Games developer  . Smokeless tobacco: Not on file  . Alcohol Use: No     OB History    Grav Para Term Preterm Abortions TAB SAB Ect Mult Living                  Review of Systems  Constitutional: Negative for fever.  Respiratory: Negative for shortness of breath.   Cardiovascular: Negative for near-syncope.  Gastrointestinal: Negative for nausea, vomiting and anorexia.  Neurological: Positive for headaches. Negative for tremors, weakness and numbness.  All other systems reviewed and are negative.    Allergies  Review of patient's allergies indicates no known allergies.  Home Medications   Current Outpatient Rx  Name Route Sig Dispense Refill  . AMLODIPINE BESYLATE 5 MG PO TABS Oral Take 1 tablet (5 mg total) by mouth daily. 90 tablet 2  . ASPIRIN 81 MG PO TABS Oral Take 81 mg by mouth daily.      [name] CALCIUM CARBONATE-VITAMIN D 600-400 MG-UNIT PO TABS Oral Take 1 tablet by mouth 2 (two) times daily.      . OMEGA-3 FATTY ACIDS 1000 MG PO CAPS Oral Take 1 g by mouth daily.      [name] GARLIC 1000 MG PO CAPS Oral Take 1 capsule by mouth daily.      [name] LOSARTAN POTASSIUM 100 MG PO TABS Oral Take 1 tablet (100 mg total) by mouth daily. For blood pressure 90 tablet 2  . METOPROLOL SUCCINATE 50 MG PO TB24 Oral Take 1 tablet (50 mg total) by mouth 2 (two) times daily. 180 tablet 2  . MOMETASONE FURO-FORMOTEROL FUM 200-5  MCG/ACT IN AERO Inhalation Inhale 2 Act into the lungs 2 (two) times daily.      [name] ONE-DAILY MULTI VITAMINS PO TABS Oral Take 1 tablet by mouth daily.      [name] PRAVASTATIN SODIUM 80 MG PO TABS Oral Take 40 mg by mouth every evening.     [name] TIOTROPIUM BROMIDE MONOHYDRATE 18 MCG IN CAPS Inhalation Place 1 capsule (18 mcg total) into inhaler and inhale daily. 30 capsule 2  . VITAMIN C 500 MG PO TABS Oral Take 500 mg by mouth daily.      [name] VITAMIN E 400 UNITS PO CAPS Oral Take 400 Units by mouth daily.      [name] VITAMIN A & D 5000-400 UNITS PO CAPS Oral Take 1 capsule by mouth daily.      [name] VITAMIN D 1000 UNITS PO CAPS Oral Take 1 capsule (1,000 Units  total) by mouth daily. 100 capsule 3  . DIAZEPAM 5 MG PO TABS Oral Take 1 tablet (5 mg total) by mouth 2 (two) times daily. 20 tablet 0  . DIPHENHYDRAMINE HCL 25 MG PO CAPS  2 by mouth every 4 hours for allergies     . OXYCODONE-ACETAMINOPHEN 5-325 MG PO TABS Oral Take 1-2 tablets by mouth every 6 (six) hours as needed for pain. 15 tablet 0    BP 152/96  Pulse 90  Temp(Src) 98.4 F (36.9 C) (Oral)  Resp 20  SpO2 96%  Physical Exam  Nursing note and vitals reviewed. Constitutional: She is oriented to person, place, and time. She appears well-developed and well-nourished. She appears distressed.  HENT:  Head: Normocephalic and atraumatic.  Eyes: EOM are normal. Pupils are equal, round, and reactive to light.  Neck: Neck supple. Normal carotid pulses present. Muscular tenderness present. Carotid bruit is not present. Decreased range of motion present.       Pain and spasm over bilateral trapezius muscles  Cardiovascular: Normal rate, regular rhythm, normal heart sounds and intact distal pulses.  Exam reveals no friction rub.   No murmur heard. Pulmonary/Chest: Effort normal and breath sounds normal. She has no wheezes. She has no rales.  Abdominal: Soft. Bowel sounds are normal. She exhibits no distension. There is no tenderness. There is no rebound and no guarding.  Musculoskeletal: She exhibits no tenderness.       No edema  Lymphadenopathy:    She has no cervical adenopathy.  Neurological: She is alert and oriented to person, place, and time. No cranial nerve deficit.  Skin: Skin is warm and dry. No rash noted.  Psychiatric: She has a normal mood and affect. Her behavior is normal.    ED Course  Procedures (including critical care time)  Labs Reviewed  BASIC METABOLIC PANEL - Abnormal; Notable for the following:    Glucose, Bld 118 (*)    Calcium 10.9 (*)    GFR calc non Af Amer 68 (*)    GFR calc Af Amer 79 (*)    All other components within normal limits   No results  found.   1. Neck muscle spasm       MDM   Pt with posterior head and neck pain most likely from muscle spasm.  Denies numbness or tingling in the hands and no weakness.  No headache and no recent injury however due to extent of pain will check posterior circulation with CTA of neck and neg.  No other signs of abnormalities on exam except for muscle spasm in bilateral trapezius.  After pain meds pt feeling better and d/ced home with ppx.  Will f/u with PCP.        Gwyneth Sprout, MD 04/05/11 [phone]

## 2011-04-21 ENCOUNTER — Telehealth: Payer: Self-pay | Admitting: *Deleted

## 2011-04-21 ENCOUNTER — Telehealth: Payer: Self-pay | Admitting: Internal Medicine

## 2011-04-21 NOTE — Telephone Encounter (Signed)
Patient needs follow-up appointment post ED.

## 2011-04-21 NOTE — Telephone Encounter (Signed)
Pt is stating she wants a referral to a neurologist - apt made for 12/10  Thanks!

## 2011-04-22 NOTE — Telephone Encounter (Signed)
Noted. Thx.

## 2011-05-05 ENCOUNTER — Encounter: Payer: Self-pay | Admitting: Internal Medicine

## 2011-05-05 ENCOUNTER — Ambulatory Visit (INDEPENDENT_AMBULATORY_CARE_PROVIDER_SITE_OTHER): Payer: Medicare Other | Admitting: Internal Medicine

## 2011-05-05 VITALS — BP 120/70 | HR 76 | Temp 99.1°F | Resp 16 | Wt 233.0 lb

## 2011-05-05 DIAGNOSIS — E785 Hyperlipidemia, unspecified: Secondary | ICD-10-CM

## 2011-05-05 DIAGNOSIS — Z8679 Personal history of other diseases of the circulatory system: Secondary | ICD-10-CM

## 2011-05-05 DIAGNOSIS — I10 Essential (primary) hypertension: Secondary | ICD-10-CM

## 2011-05-05 DIAGNOSIS — G8929 Other chronic pain: Secondary | ICD-10-CM | POA: Insufficient documentation

## 2011-05-05 DIAGNOSIS — M542 Cervicalgia: Secondary | ICD-10-CM

## 2011-05-05 MED ORDER — IBUPROFEN 600 MG PO TABS: ORAL_TABLET | ORAL | Status: AC

## 2011-05-05 NOTE — Assessment & Plan Note (Signed)
Continue with current prescription therapy as reflected on the Med list.  

## 2011-05-05 NOTE — Assessment & Plan Note (Signed)
D/c calcium 

## 2011-05-05 NOTE — Assessment & Plan Note (Signed)
04/04/11 CT: IMPRESSION:  1. Normal variant vertebral arteries with some proximal tortuosity  but no significant proximal stenosis or injury.  2. Tortuous cervical vasculature, potentially related to  hypertension.  3. Minimal calcification at the carotid bifurcations bilaterally  without significant stenosis.  4. Moderate spondylosis of the cervical spine.  5. Heterogeneous left thyroid goiter displaces the trachea to the  right without compromise of the airway.  The preliminary report was provided by Dr. Benard Rink in writing at  06:42 p.m. 04/04/2011.  Original Report Authenticated By: Jamesetta Orleans. MATTERN, M.D.    See Meds Offered PT - declined Asked for a brace

## 2011-05-05 NOTE — Progress Notes (Signed)
  Subjective:    Patient ID: Mindy Taylor, female    DOB: Mar 10, 1929, 77 y.o.   MRN: [id number]  HPI The patient presents for a follow-up of  chronic hypertension, chronic dyslipidemia. She went to [facility] ER 04/04/11 or severe neck pain.     Review of Systems  Constitutional: Negative for chills, activity change, appetite change, fatigue and unexpected weight change.  HENT: Negative for congestion, mouth sores and sinus pressure.   Eyes: Negative for visual disturbance.  Respiratory: Negative for cough and chest tightness.   Gastrointestinal: Negative for nausea and abdominal pain.  Genitourinary: Negative for frequency, difficulty urinating and vaginal pain.  Musculoskeletal: Positive for back pain (neck pain). Negative for gait problem.  Skin: Negative for pallor and rash.  Neurological: Negative for dizziness, tremors, weakness, numbness and headaches.  Psychiatric/Behavioral: Negative for confusion and sleep disturbance.       Objective:   Physical Exam  Constitutional: She appears well-developed and well-nourished. No distress.       Obese   HENT:  Head: Normocephalic.  Right Ear: External ear normal.  Left Ear: External ear normal.  Nose: Nose normal.  Mouth/Throat: Oropharynx is clear and moist.  Eyes: Conjunctivae are normal. Pupils are equal, round, and reactive to light. Right eye exhibits no discharge. Left eye exhibits no discharge.  Neck: Normal range of motion. Neck supple. No JVD present. No tracheal deviation present. No thyromegaly present.  Cardiovascular: Normal rate, regular rhythm and normal heart sounds.   Pulmonary/Chest: No stridor. No respiratory distress. She has no wheezes.  Abdominal: Soft. Bowel sounds are normal. She exhibits no distension and no mass. There is no tenderness. There is no rebound and no guarding.  Musculoskeletal: She exhibits tenderness (B neck and occip nerve areas). She exhibits no edema.  Lymphadenopathy:    She has no cervical  adenopathy.  Neurological: She displays normal reflexes. No cranial nerve deficit. She exhibits normal muscle tone. Coordination normal.  Skin: No rash noted. No erythema.  Psychiatric: She has a normal mood and affect. Her behavior is normal. Judgment and thought content normal.     CT neck was reviewed     Assessment & Plan:

## 2011-05-05 NOTE — Patient Instructions (Signed)
Wt Readings from Last 3 Encounters:  05/05/11 233 lb (105.688 kg)  02/18/11 245 lb (111.131 kg)  01/03/11 235 lb (106.595 kg)   BP Readings from Last 3 Encounters:  05/05/11 120/70  04/04/11 152/96  02/18/11 130/90

## 2011-05-05 NOTE — Assessment & Plan Note (Addendum)
Hold Pravachol x 1-2 wks (?neck pain), if no better -Continue with current pravachol prescription therapy as reflected on the Med list.

## 2011-05-06 ENCOUNTER — Ambulatory Visit: Payer: Medicare Other | Admitting: Internal Medicine

## 2011-05-23 ENCOUNTER — Telehealth: Payer: Self-pay

## 2011-05-23 NOTE — Telephone Encounter (Signed)
Ok to hold rx for now rtc 2 wks thx

## 2011-05-23 NOTE — Telephone Encounter (Signed)
Patient notified and transferred to scheduling to set up appt

## 2011-05-23 NOTE — Telephone Encounter (Signed)
Patient called stating a couple of mos ago MD d/c cholesterol medication bust she is not sure why. She said that she started back taking her medication x 2 days and c/o L side chest discomfort. She reports no SOB or tingling and has not  Had any additional discomfort this am. I advised her to hold off on taking anymore until we can obtain advisement. Per pt she used to take 40mg  but rx was increased to 80mg  1/2 bid

## 2011-06-06 ENCOUNTER — Ambulatory Visit: Payer: Medicare Other | Admitting: Internal Medicine

## 2011-08-08 ENCOUNTER — Encounter: Payer: Self-pay | Admitting: Internal Medicine

## 2011-08-08 ENCOUNTER — Ambulatory Visit (INDEPENDENT_AMBULATORY_CARE_PROVIDER_SITE_OTHER): Payer: Medicare Other | Admitting: Internal Medicine

## 2011-08-08 VITALS — BP 130/70 | HR 88 | Temp 98.8°F | Resp 16 | Wt 240.0 lb

## 2011-08-08 DIAGNOSIS — M542 Cervicalgia: Secondary | ICD-10-CM | POA: Diagnosis not present

## 2011-08-08 DIAGNOSIS — G8929 Other chronic pain: Secondary | ICD-10-CM

## 2011-08-08 DIAGNOSIS — E669 Obesity, unspecified: Secondary | ICD-10-CM

## 2011-08-08 DIAGNOSIS — M1990 Unspecified osteoarthritis, unspecified site: Secondary | ICD-10-CM | POA: Diagnosis not present

## 2011-08-08 DIAGNOSIS — R7309 Other abnormal glucose: Secondary | ICD-10-CM

## 2011-08-08 DIAGNOSIS — I6350 Cerebral infarction due to unspecified occlusion or stenosis of unspecified cerebral artery: Secondary | ICD-10-CM

## 2011-08-08 DIAGNOSIS — E785 Hyperlipidemia, unspecified: Secondary | ICD-10-CM

## 2011-08-08 DIAGNOSIS — R739 Hyperglycemia, unspecified: Secondary | ICD-10-CM

## 2011-08-08 DIAGNOSIS — I10 Essential (primary) hypertension: Secondary | ICD-10-CM

## 2011-08-08 DIAGNOSIS — R279 Unspecified lack of coordination: Secondary | ICD-10-CM

## 2011-08-08 NOTE — Assessment & Plan Note (Signed)
Overall better Continue with current prescription therapy as reflected on the Med list.  

## 2011-08-08 NOTE — Assessment & Plan Note (Signed)
Continue with current prescription therapy as reflected on the Med list.  

## 2011-08-08 NOTE — Assessment & Plan Note (Signed)
Much improved

## 2011-08-08 NOTE — Assessment & Plan Note (Signed)
Wt loss Labs 

## 2011-08-08 NOTE — Progress Notes (Signed)
Patient ID: Mindy Taylor, female   DOB: 1928/07/09, 78 y.o.   MRN: [id number]  Subjective:    Patient ID: Mindy Taylor, female    DOB: Jul 04, 1928, 78 y.o.   MRN: [id number]  HPI The patient presents for a follow-up of  chronic hypertension, chronic dyslipidemia.   BP Readings from Last 3 Encounters:  08/08/11 130/70  05/05/11 120/70  04/04/11 152/96     Wt Readings from Last 3 Encounters:  08/08/11 240 lb (108.863 kg)  05/05/11 233 lb (105.688 kg)  02/18/11 245 lb (111.131 kg)    Review of Systems  Constitutional: Negative for chills, activity change, appetite change, fatigue and unexpected weight change.  HENT: Negative for congestion, mouth sores and sinus pressure.   Eyes: Negative for visual disturbance.  Respiratory: Negative for cough and chest tightness.   Gastrointestinal: Negative for nausea and abdominal pain.  Genitourinary: Negative for frequency, difficulty urinating and vaginal pain.  Musculoskeletal: Positive for back pain (neck pain). Negative for gait problem.  Skin: Negative for pallor and rash.  Neurological: Negative for dizziness, tremors, weakness, numbness and headaches.  Psychiatric/Behavioral: Negative for confusion and sleep disturbance.       Objective:   Physical Exam  Constitutional: She appears well-developed and well-nourished. No distress.       Obese   HENT:  Head: Normocephalic.  Right Ear: External ear normal.  Left Ear: External ear normal.  Nose: Nose normal.  Mouth/Throat: Oropharynx is clear and moist.  Eyes: Conjunctivae are normal. Pupils are equal, round, and reactive to light. Right eye exhibits no discharge. Left eye exhibits no discharge.  Neck: Normal range of motion. Neck supple. No JVD present. No tracheal deviation present. No thyromegaly present.  Cardiovascular: Normal rate, regular rhythm and normal heart sounds.   Pulmonary/Chest: No stridor. No respiratory distress. She has no wheezes.  Abdominal: Soft. Bowel sounds  are normal. She exhibits no distension and no mass. There is no tenderness. There is no rebound and no guarding.  Musculoskeletal: She exhibits no edema and no tenderness (B neck and occip nerve areas).  Lymphadenopathy:    She has no cervical adenopathy.  Neurological: She displays normal reflexes. No cranial nerve deficit. She exhibits normal muscle tone. Coordination normal.  Skin: No rash noted. No erythema.  Psychiatric: She has a normal mood and affect. Her behavior is normal. Judgment and thought content normal.     Lab Results  Component Value Date   WBC 8.0 01/03/2011   HGB 12.3 01/03/2011   HCT 37.4 01/03/2011   PLT 227.0 01/03/2011   GLUCOSE 118* 04/04/2011   CHOL 175 02/28/2009   TRIG 68.0 02/28/2009   HDL 55.10 02/28/2009   LDLCALC 106* 02/28/2009   ALT 25 01/03/2011   AST 23 01/03/2011   NA 140 04/04/2011   K 3.7 04/04/2011   CL 101 04/04/2011   CREATININE 0.80 04/04/2011   BUN 17 04/04/2011   CO2 28 04/04/2011   TSH 0.60 01/03/2011        Assessment & Plan:

## 2011-08-08 NOTE — Assessment & Plan Note (Signed)
Discussed.

## 2011-08-11 ENCOUNTER — Other Ambulatory Visit (INDEPENDENT_AMBULATORY_CARE_PROVIDER_SITE_OTHER): Payer: Medicare Other

## 2011-08-11 DIAGNOSIS — E669 Obesity, unspecified: Secondary | ICD-10-CM

## 2011-08-11 DIAGNOSIS — M1990 Unspecified osteoarthritis, unspecified site: Secondary | ICD-10-CM

## 2011-08-11 DIAGNOSIS — Z79899 Other long term (current) drug therapy: Secondary | ICD-10-CM | POA: Diagnosis not present

## 2011-08-11 DIAGNOSIS — I10 Essential (primary) hypertension: Secondary | ICD-10-CM

## 2011-08-11 DIAGNOSIS — M542 Cervicalgia: Secondary | ICD-10-CM | POA: Diagnosis not present

## 2011-08-11 DIAGNOSIS — G8929 Other chronic pain: Secondary | ICD-10-CM

## 2011-08-11 DIAGNOSIS — R7309 Other abnormal glucose: Secondary | ICD-10-CM | POA: Diagnosis not present

## 2011-08-11 DIAGNOSIS — E785 Hyperlipidemia, unspecified: Secondary | ICD-10-CM

## 2011-08-11 DIAGNOSIS — I6350 Cerebral infarction due to unspecified occlusion or stenosis of unspecified cerebral artery: Secondary | ICD-10-CM

## 2011-08-11 DIAGNOSIS — R279 Unspecified lack of coordination: Secondary | ICD-10-CM

## 2011-08-11 DIAGNOSIS — R739 Hyperglycemia, unspecified: Secondary | ICD-10-CM

## 2011-08-11 LAB — BASIC METABOLIC PANEL
CO2: 26 mEq/L (ref 19–32)
Chloride: 106 mEq/L (ref 96–112)
Potassium: 4.7 mEq/L (ref 3.5–5.1)
Sodium: 141 mEq/L (ref 135–145)

## 2011-09-08 ENCOUNTER — Other Ambulatory Visit: Payer: Self-pay | Admitting: *Deleted

## 2011-09-08 MED ORDER — AMLODIPINE BESYLATE 5 MG PO TABS: 5.0000 mg | ORAL_TABLET | Freq: Every day | ORAL | Status: DC

## 2011-09-08 MED ORDER — METOPROLOL SUCCINATE ER 50 MG PO TB24: 50.0000 mg | ORAL_TABLET | Freq: Two times a day (BID) | ORAL | Status: DC

## 2011-09-08 MED ORDER — LOSARTAN POTASSIUM 100 MG PO TABS: 100.0000 mg | ORAL_TABLET | Freq: Every day | ORAL | Status: DC

## 2011-10-02 ENCOUNTER — Ambulatory Visit (INDEPENDENT_AMBULATORY_CARE_PROVIDER_SITE_OTHER): Payer: Medicare Other | Admitting: Endocrinology

## 2011-10-02 ENCOUNTER — Other Ambulatory Visit (INDEPENDENT_AMBULATORY_CARE_PROVIDER_SITE_OTHER): Payer: Medicare Other

## 2011-10-02 ENCOUNTER — Encounter (INDEPENDENT_AMBULATORY_CARE_PROVIDER_SITE_OTHER): Payer: Medicare Other

## 2011-10-02 DIAGNOSIS — M7989 Other specified soft tissue disorders: Secondary | ICD-10-CM | POA: Diagnosis not present

## 2011-10-02 DIAGNOSIS — M79609 Pain in unspecified limb: Secondary | ICD-10-CM

## 2011-10-02 DIAGNOSIS — M25562 Pain in left knee: Secondary | ICD-10-CM

## 2011-10-02 DIAGNOSIS — M25569 Pain in unspecified knee: Secondary | ICD-10-CM

## 2011-10-02 LAB — URIC ACID: Uric Acid, Serum: 3.4 mg/dL (ref 2.4–7.0)

## 2011-10-02 LAB — SEDIMENTATION RATE: Sed Rate: 77 mm/hr — ABNORMAL HIGH (ref 0–22)

## 2011-10-02 MED ORDER — IBUPROFEN 600 MG PO TABS: 600.0000 mg | ORAL_TABLET | Freq: Three times a day (TID) | ORAL | Status: AC | PRN

## 2011-10-02 NOTE — Progress Notes (Signed)
Subjective:    Patient ID: Mindy Taylor, female    DOB: March 07, 1929, 78 y.o.   MRN: [id number]  HPI Pt states 1 week of moderate exac of chronic pain at the left knee.  She is unable to cite precip factor.  No assoc numbness.  She ha had drainage of effusion, and steroid injection in the past.   Past Medical History  Diagnosis Date  . ANXIETY 03/05/2007  . ATAXIA 12/26/2009  . BEE STING 03/05/2007  . BRONCHITIS, ACUTE 06/28/2010  . CANDIDIASIS, VAGINAL 09/08/2008  . CEREBROVASCULAR ACCIDENT, HX OF 01/04/2010  . CERUMEN IMPACTION 06/05/2008  . DIZZINESS 12/26/2009  . Epistaxis 06/05/2008  . Headache 06/05/2008  . HYPERLIPIDEMIA 12/08/2006  . HYPERTENSION 12/08/2006  . 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 NECK PAIN 06/05/2008  . OBESITY 09/07/2009  . ONYCHOMYCOSIS 03/09/2009  . OSTEOARTHRITIS 12/08/2006  . PARESTHESIA 12/26/2009  . TOBACCO USE, QUIT 03/09/2009    Past Surgical History  Procedure Date  . Breast surgery   . Abdominal hysterectomy     History   Social History  . Marital Status: Widowed    Spouse Name: N/A    Number of Children: N/A  . Years of Education: N/A   Occupational History  . Not on file.   Social History Main Topics  . Smoking status: Former Games developer  . Smokeless tobacco: Not on file  . Alcohol Use: No  . Drug Use: No  . Sexually Active: No   Other Topics Concern  . Not on file   Social History Narrative  . No narrative on file    Current Outpatient Prescriptions on File Prior to Visit  Medication Sig Dispense Refill  . amLODipine (NORVASC) 5 MG tablet Take 1 tablet (5 mg total) by mouth daily.  90 tablet  2  . aspirin 81 MG tablet Take 81 mg by mouth daily.        . Cholecalciferol (VITAMIN D) 1000 UNITS capsule Take 1 capsule (1,000 Units total) by mouth daily.  100 capsule  3  . fish oil-omega-3 fatty acids 1000 MG capsule Take 1 g by mouth daily.        [name] losartan (COZAAR) 100 MG tablet Take 1 tablet (100 mg total) by mouth daily. For blood pressure   90 tablet  2  . metoprolol succinate (TOPROL-XL) 50 MG 24 hr tablet Take 1 tablet (50 mg total) by mouth 2 (two) times daily.  180 tablet  0  . Mometasone Furo-Formoterol Fum 200-5 MCG/ACT AERO Inhale 2 Act into the lungs 2 (two) times daily.        . Multiple Vitamin (MULTIVITAMIN) tablet Take 1 tablet by mouth daily.        . pravastatin (PRAVACHOL) 80 MG tablet Take 40 mg by mouth every evening.       . tiotropium (SPIRIVA HANDIHALER) 18 MCG inhalation capsule Place 1 capsule (18 mcg total) into inhaler and inhale daily.  30 capsule  2  . vitamin C (ASCORBIC ACID) 500 MG tablet Take 500 mg by mouth daily.        . vitamin E 400 UNIT capsule Take 400 Units by mouth daily.        . Vitamins A & D (VITAMIN A & D) 5000-400 UNITS CAPS Take 1 capsule by mouth daily.        . diphenhydrAMINE (BENADRYL) 25 mg capsule 2 by mouth every 4 hours for allergies       . Garlic 1000 MG CAPS Take  1 capsule by mouth daily.        [name] DISCONTD: pravastatin (PRAVACHOL) 80 MG tablet Take 1 tablet (80 mg total) by mouth every evening.  90 tablet  3   Current Facility-Administered Medications on File Prior to Visit  Medication Dose Route Frequency Provider Last Rate Last Dose  . methylPREDNISolone acetate (DEPO-MEDROL) injection 80 mg  80 mg Intra-articular Once Tresa Garter, MD        No Known Allergies  Family History  Problem Relation Age of Onset  . Hypertension Other   . Hypertension Mother   . Hypertension Father     There were no vitals taken for this visit.    Review of Systems Denies fever and falls.      Objective:   Physical Exam VITAL SIGNS:  See vs page GENERAL: no distress Left knee: no tend/warmth. There is slight tenderness and swelling.  The left calf is also swollen   Uric acid=normal Esr is elvated    Assessment & Plan:  Knee pain, worse

## 2011-10-02 NOTE — Patient Instructions (Addendum)
Let's check for a blood clot of your left leg.   blood tests are being requested for you today.  You will receive a letter with results. Refer to an orthopedic specialist.  you will receive a phone call, about a day and time for an appointment. i have sent a prescription to your pharmacy, to refill the ibuprofen. While you are on the ibuprofen, take prilosec otc 20 mg daily

## 2011-10-03 ENCOUNTER — Encounter: Payer: Self-pay | Admitting: Endocrinology

## 2011-10-03 LAB — PTH, INTACT AND CALCIUM: Calcium, Total (PTH): 9.5 mg/dL (ref 8.4–10.5)

## 2011-10-06 DIAGNOSIS — M25569 Pain in unspecified knee: Secondary | ICD-10-CM | POA: Diagnosis not present

## 2011-10-06 DIAGNOSIS — M1710 Unilateral primary osteoarthritis, unspecified knee: Secondary | ICD-10-CM | POA: Diagnosis not present

## 2011-10-21 DIAGNOSIS — M25569 Pain in unspecified knee: Secondary | ICD-10-CM | POA: Diagnosis not present

## 2011-10-21 DIAGNOSIS — M1710 Unilateral primary osteoarthritis, unspecified knee: Secondary | ICD-10-CM | POA: Diagnosis not present

## 2011-10-28 DIAGNOSIS — M1710 Unilateral primary osteoarthritis, unspecified knee: Secondary | ICD-10-CM | POA: Diagnosis not present

## 2011-11-04 DIAGNOSIS — M1710 Unilateral primary osteoarthritis, unspecified knee: Secondary | ICD-10-CM | POA: Diagnosis not present

## 2012-02-04 ENCOUNTER — Ambulatory Visit: Payer: Medicare Other | Admitting: Internal Medicine

## 2012-03-01 ENCOUNTER — Ambulatory Visit: Payer: Medicare Other | Admitting: Internal Medicine

## 2012-03-12 ENCOUNTER — Encounter: Payer: Self-pay | Admitting: Internal Medicine

## 2012-03-12 ENCOUNTER — Ambulatory Visit (INDEPENDENT_AMBULATORY_CARE_PROVIDER_SITE_OTHER): Payer: Medicare Other | Admitting: Internal Medicine

## 2012-03-12 ENCOUNTER — Telehealth: Payer: Self-pay | Admitting: Internal Medicine

## 2012-03-12 VITALS — BP 140/80 | HR 68 | Temp 98.8°F | Resp 16 | Wt 244.0 lb

## 2012-03-12 DIAGNOSIS — M25529 Pain in unspecified elbow: Secondary | ICD-10-CM | POA: Diagnosis not present

## 2012-03-12 DIAGNOSIS — M25521 Pain in right elbow: Secondary | ICD-10-CM

## 2012-03-12 MED ORDER — DICLOFENAC EPOLAMINE 1.3 % TD PTCH: 1.0000 | MEDICATED_PATCH | Freq: Two times a day (BID) | TRANSDERMAL | Status: DC

## 2012-03-12 MED ORDER — IBUPROFEN-FAMOTIDINE 800-26.6 MG PO TABS: 1.0000 | ORAL_TABLET | Freq: Two times a day (BID) | ORAL | Status: DC

## 2012-03-12 NOTE — Telephone Encounter (Signed)
Called pt to schedule.  No answer, lvmom.  Pt added on schedule for 4:30pm.

## 2012-03-12 NOTE — Patient Instructions (Addendum)
Ice Use a sling

## 2012-03-12 NOTE — Progress Notes (Signed)
   Subjective:    Patient ID: Mindy Taylor, female    DOB: February 01, 1929, 78 y.o.   MRN: [id number]  Arm Pain  Incident onset: no accident. Pertinent negatives include no numbness.  C/o R elbow pain w/o injury x 2-3 d - severe The patient presents for a follow-up of  chronic hypertension, chronic dyslipidemia.   BP Readings from Last 3 Encounters:  03/12/12 140/80  08/08/11 130/70  05/05/11 120/70     Wt Readings from Last 3 Encounters:  03/12/12 244 lb (110.678 kg)  08/08/11 240 lb (108.863 kg)  05/05/11 233 lb (105.688 kg)    Review of Systems  Constitutional: Negative for chills, activity change, appetite change, fatigue and unexpected weight change.  HENT: Negative for congestion, mouth sores and sinus pressure.   Eyes: Negative for visual disturbance.  Respiratory: Negative for cough and chest tightness.   Gastrointestinal: Negative for nausea and abdominal pain.  Genitourinary: Negative for frequency, difficulty urinating and vaginal pain.  Musculoskeletal: Positive for back pain (neck pain). Negative for gait problem.  Skin: Negative for pallor and rash.  Neurological: Negative for dizziness, tremors, weakness, numbness and headaches.  Psychiatric/Behavioral: Negative for confusion and disturbed wake/sleep cycle.       Objective:   Physical Exam  Constitutional: She appears well-developed and well-nourished. No distress.       Obese   HENT:  Head: Normocephalic.  Right Ear: External ear normal.  Left Ear: External ear normal.  Nose: Nose normal.  Mouth/Throat: Oropharynx is clear and moist.  Eyes: Conjunctivae normal are normal. Pupils are equal, round, and reactive to light. Right eye exhibits no discharge. Left eye exhibits no discharge.  Neck: Normal range of motion. Neck supple. No JVD present. No tracheal deviation present. No thyromegaly present.  Cardiovascular: Normal rate, regular rhythm and normal heart sounds.   Pulmonary/Chest: No stridor. No  respiratory distress. She has no wheezes.  Abdominal: Soft. Bowel sounds are normal. She exhibits no distension and no mass. There is no tenderness. There is no rebound and no guarding.  Musculoskeletal: She exhibits no edema and no tenderness (B neck and occip nerve areas).  Lymphadenopathy:    She has no cervical adenopathy.  Neurological: She displays normal reflexes. No cranial nerve deficit. She exhibits normal muscle tone. Coordination normal.  Skin: No rash noted. No erythema.  Psychiatric: She has a normal mood and affect. Her behavior is normal. Judgment and thought content normal.  R lat elbow is vey tender   Lab Results  Component Value Date   WBC 8.0 01/03/2011   HGB 12.3 01/03/2011   HCT 37.4 01/03/2011   PLT 227.0 01/03/2011   GLUCOSE 94 08/11/2011   CHOL 175 02/28/2009   TRIG 68.0 02/28/2009   HDL 55.10 02/28/2009   LDLCALC 106* 02/28/2009   ALT 25 01/03/2011   AST 23 01/03/2011   NA 141 08/11/2011   K 4.7 08/11/2011   CL 106 08/11/2011   CREATININE 0.7 08/11/2011   BUN 14 08/11/2011   CO2 26 08/11/2011   TSH 0.60 01/03/2011   HGBA1C 5.7 08/11/2011        Assessment & Plan:

## 2012-03-12 NOTE — Telephone Encounter (Signed)
Caller: [name]/Patient; Patient Name: Mindy Taylor, Mindy Taylor; PCP: Plotnikov, Alex (Adults only); Best Callback Phone Number: [phone].  Patient complaining of Right arm pain , onset Wednesday 03/10/12.  She states it has continued hurt through out the day.  She states she finally could not raise it to comb her hair.  Located at elbow and radiates to her fingers. Denies Injury.  Mild swelling below the elbow.  Pain is excrutiating. Treated with Ibuprofen , "stop pain", Heat wraps and last night did not sleep at all .  She states she cannot dress herself, put her bra on.  Emergent s/sx ruled out per Arm Non-Injury Protocol with the exception of "Severe Pain with movement that limits normal activities" . See Provider in 24 hours.  Patient request Dr. Posey Rea.  Advised patient he had no appointment available today.  REQUEST TO BE WORKED IN DUE TO PAIN.  PLEASE CONTACT PATINET FOR APPOINTMENT TODAY FOR ARM PAIN.

## 2012-03-12 NOTE — Telephone Encounter (Signed)
Ok 4:30 Thx 

## 2012-03-15 ENCOUNTER — Encounter: Payer: Self-pay | Admitting: Internal Medicine

## 2012-03-15 DIAGNOSIS — M25521 Pain in right elbow: Secondary | ICD-10-CM | POA: Insufficient documentation

## 2012-03-15 NOTE — Assessment & Plan Note (Signed)
Flector ACE Sling Ice Ibuprofen Rx

## 2012-03-19 ENCOUNTER — Ambulatory Visit (INDEPENDENT_AMBULATORY_CARE_PROVIDER_SITE_OTHER): Payer: Medicare Other | Admitting: Internal Medicine

## 2012-03-19 ENCOUNTER — Encounter: Payer: Self-pay | Admitting: Internal Medicine

## 2012-03-19 VITALS — BP 140/80 | HR 84 | Temp 98.4°F | Resp 16 | Wt 246.0 lb

## 2012-03-19 DIAGNOSIS — Z23 Encounter for immunization: Secondary | ICD-10-CM

## 2012-03-19 DIAGNOSIS — R279 Unspecified lack of coordination: Secondary | ICD-10-CM

## 2012-03-19 DIAGNOSIS — M25521 Pain in right elbow: Secondary | ICD-10-CM

## 2012-03-19 DIAGNOSIS — M25529 Pain in unspecified elbow: Secondary | ICD-10-CM

## 2012-03-19 DIAGNOSIS — E669 Obesity, unspecified: Secondary | ICD-10-CM

## 2012-03-19 DIAGNOSIS — I10 Essential (primary) hypertension: Secondary | ICD-10-CM | POA: Diagnosis not present

## 2012-03-19 DIAGNOSIS — M1990 Unspecified osteoarthritis, unspecified site: Secondary | ICD-10-CM

## 2012-03-19 NOTE — Assessment & Plan Note (Signed)
Continue with current prescription therapy as reflected on the Med list.  

## 2012-03-19 NOTE — Progress Notes (Signed)
   Subjective:    Patient ID: Mindy Taylor, female    DOB: 1928/07/30, 78 y.o.   MRN: [id number]  HPI The patient presents for a follow-up of  chronic hypertension, chronic dyslipidemia.   BP Readings from Last 3 Encounters:  03/19/12 140/80  03/12/12 140/80  08/08/11 130/70     Wt Readings from Last 3 Encounters:  03/19/12 246 lb (111.585 kg)  03/12/12 244 lb (110.678 kg)  08/08/11 240 lb (108.863 kg)    Review of Systems  Constitutional: Negative for chills, activity change, appetite change, fatigue and unexpected weight change.  HENT: Negative for congestion, mouth sores and sinus pressure.   Eyes: Negative for visual disturbance.  Respiratory: Negative for cough and chest tightness.   Gastrointestinal: Negative for nausea and abdominal pain.  Genitourinary: Negative for frequency, difficulty urinating and vaginal pain.  Musculoskeletal: Positive for back pain (neck pain). Negative for gait problem.  Skin: Negative for pallor and rash.  Neurological: Negative for dizziness, tremors, weakness, numbness and headaches.  Psychiatric/Behavioral: Negative for confusion and disturbed wake/sleep cycle.       Objective:   Physical Exam  Constitutional: She appears well-developed and well-nourished. No distress.       Obese   HENT:  Head: Normocephalic.  Right Ear: External ear normal.  Left Ear: External ear normal.  Nose: Nose normal.  Mouth/Throat: Oropharynx is clear and moist.  Eyes: Conjunctivae normal are normal. Pupils are equal, round, and reactive to light. Right eye exhibits no discharge. Left eye exhibits no discharge.  Neck: Normal range of motion. Neck supple. No JVD present. No tracheal deviation present. No thyromegaly present.  Cardiovascular: Normal rate, regular rhythm and normal heart sounds.   Pulmonary/Chest: No stridor. No respiratory distress. She has no wheezes.  Abdominal: Soft. Bowel sounds are normal. She exhibits no distension and no mass. There  is no tenderness. There is no rebound and no guarding.  Musculoskeletal: She exhibits no edema and no tenderness (B neck and occip nerve areas).  Lymphadenopathy:    She has no cervical adenopathy.  Neurological: She displays normal reflexes. No cranial nerve deficit. She exhibits normal muscle tone. Coordination normal.  Skin: No rash noted. No erythema.  Psychiatric: She has a normal mood and affect. Her behavior is normal. Judgment and thought content normal.     Lab Results  Component Value Date   WBC 8.0 01/03/2011   HGB 12.3 01/03/2011   HCT 37.4 01/03/2011   PLT 227.0 01/03/2011   GLUCOSE 94 08/11/2011   CHOL 175 02/28/2009   TRIG 68.0 02/28/2009   HDL 55.10 02/28/2009   LDLCALC 106* 02/28/2009   ALT 25 01/03/2011   AST 23 01/03/2011   NA 141 08/11/2011   K 4.7 08/11/2011   CL 106 08/11/2011   CREATININE 0.7 08/11/2011   BUN 14 08/11/2011   CO2 26 08/11/2011   TSH 0.60 01/03/2011   HGBA1C 5.7 08/11/2011        Assessment & Plan:

## 2012-03-21 NOTE — Assessment & Plan Note (Signed)
Continue with current prescription therapy as reflected on the Med list.  

## 2012-03-21 NOTE — Assessment & Plan Note (Signed)
Discussed balance exercise need

## 2012-03-21 NOTE — Assessment & Plan Note (Signed)
Much better  Continue with current prescription therapy prn as reflected on the Med list.

## 2012-04-26 ENCOUNTER — Telehealth: Payer: Self-pay | Admitting: Internal Medicine

## 2012-04-26 NOTE — Telephone Encounter (Signed)
Patient Information:  Caller Name: Sammi  Phone: [phone]  Patient: Mindy Taylor, Mindy Taylor  Gender: Female  DOB: [birth year]  Age: 78 Years  PCP: Plotnikov, Alex (Adults only)   Symptoms  Reason For Call & Symptoms: knee swelling  Reviewed Health History In EMR: Yes  Reviewed Medications In EMR: Yes  Reviewed Allergies In EMR: Yes  Reviewed Surgeries / Procedures: Yes  Date of Onset of Symptoms: 04/22/2012  Treatments Tried: hot and cold wraps, stop pain medication  Treatments Tried Worked: No  Guideline(s) Used:  Knee Pain  Disposition Per Guideline:   See Within 3 Days in Office  Reason For Disposition Reached:   Moderate pain (e.g., symptoms interfere with work or school, limping) and present > 3 days  Advice Given:  Knee Pain after Overuse:   Apply Cold to the Area: Apply a cold pack or ice bag (wrapped in a moist towel) to the area for 20 minutes. Repeat in 1 hour, then every 4 hours while awake. Continue this for the first 48 hours after an overuse injury (Reason: reduce the swelling and pain).  Apply Heat to the Area: Beginning 48 hours after an injury, apply a warm washcloth or heating pad for 10 minutes three times a day to help increase blood flow and improve healing.  Rest Your Knee   for the next couple days. Avoid activities that worsen your pain. Reduce activities that put a lot of strain on the knee joint (e.g., deep knee bends, stair climbing, running).  Pain Medicines:  For pain relief, you can take either acetaminophen, ibuprofen, or naproxen.  They are over-the-counter (OTC) pain drugs. You can buy them at the drugstore.  Call Back If:  You become worse.  Office Follow Up:  Does the office need to follow up with this patient?: No  Instructions For The Office: N/A  Appointment Scheduled:  04/27/2012 15:45:00 Appointment Scheduled Provider:  Sonda Primes (Adults only)

## 2012-04-27 ENCOUNTER — Ambulatory Visit (INDEPENDENT_AMBULATORY_CARE_PROVIDER_SITE_OTHER): Payer: Medicare Other | Admitting: Internal Medicine

## 2012-04-27 ENCOUNTER — Encounter: Payer: Self-pay | Admitting: Internal Medicine

## 2012-04-27 VITALS — BP 120/72 | HR 76 | Temp 99.7°F | Resp 16 | Wt 243.0 lb

## 2012-04-27 DIAGNOSIS — M25569 Pain in unspecified knee: Secondary | ICD-10-CM

## 2012-04-27 DIAGNOSIS — M25562 Pain in left knee: Secondary | ICD-10-CM

## 2012-04-27 MED ORDER — TRAMADOL HCL 50 MG PO TABS: 50.0000 mg | ORAL_TABLET | Freq: Two times a day (BID) | ORAL | Status: DC | PRN

## 2012-04-27 NOTE — Patient Instructions (Addendum)
Postprocedure instructions :    A Band-Aid should be left on for 12 hours. Injection therapy is not a cure itself. It is used in conjunction with other modalities. You can use nonsteroidal anti-inflammatories like ibuprofen , hot and cold compresses. Rest is recommended in the next 24 hours. You need to report immediately  if fever, chills or any signs of infection develop. 

## 2012-04-27 NOTE — Progress Notes (Signed)
Subjective:    Patient ID: Mindy Taylor, female    DOB: Oct 08, 1928, 78 y.o.   MRN: [id number]  Knee Pain  There was no injury mechanism. The pain is present in the left knee. The pain is at a severity of 8/10. The pain is moderate. Pertinent negatives include no numbness. She has tried acetaminophen for the symptoms.  L knee pain is chronic The patient presents for a follow-up of  chronic hypertension, chronic dyslipidemia.   BP Readings from Last 3 Encounters:  04/27/12 120/72  03/19/12 140/80  03/12/12 140/80     Wt Readings from Last 3 Encounters:  04/27/12 243 lb (110.224 kg)  03/19/12 246 lb (111.585 kg)  03/12/12 244 lb (110.678 kg)    Review of Systems  Constitutional: Negative for chills, activity change, appetite change, fatigue and unexpected weight change.  HENT: Negative for congestion, mouth sores and sinus pressure.   Eyes: Negative for visual disturbance.  Respiratory: Negative for cough and chest tightness.   Gastrointestinal: Negative for nausea and abdominal pain.  Genitourinary: Negative for frequency, difficulty urinating and vaginal pain.  Musculoskeletal: Positive for back pain (neck pain). Negative for gait problem.  Skin: Negative for pallor and rash.  Neurological: Negative for dizziness, tremors, weakness, numbness and headaches.  Psychiatric/Behavioral: Negative for confusion and sleep disturbance.       Objective:   Physical Exam  Constitutional: She appears well-developed and well-nourished. No distress.       Obese   HENT:  Head: Normocephalic.  Right Ear: External ear normal.  Left Ear: External ear normal.  Nose: Nose normal.  Mouth/Throat: Oropharynx is clear and moist.  Eyes: Conjunctivae normal are normal. Pupils are equal, round, and reactive to light. Right eye exhibits no discharge. Left eye exhibits no discharge.  Neck: Normal range of motion. Neck supple. No JVD present. No tracheal deviation present. No thyromegaly present.   Cardiovascular: Normal rate, regular rhythm and normal heart sounds.   Pulmonary/Chest: No stridor. No respiratory distress. She has no wheezes.  Abdominal: Soft. Bowel sounds are normal. She exhibits no distension and no mass. There is no tenderness. There is no rebound and no guarding.  Musculoskeletal: She exhibits no edema and no tenderness (B neck and occip nerve areas).  Lymphadenopathy:    She has no cervical adenopathy.  Neurological: She displays normal reflexes. No cranial nerve deficit. She exhibits normal muscle tone. Coordination normal.  Skin: No rash noted. No erythema.  Psychiatric: She has a normal mood and affect. Her behavior is normal. Judgment and thought content normal.  L knee is tender   Lab Results  Component Value Date   WBC 8.0 01/03/2011   HGB 12.3 01/03/2011   HCT 37.4 01/03/2011   PLT 227.0 01/03/2011   GLUCOSE 94 08/11/2011   CHOL 175 02/28/2009   TRIG 68.0 02/28/2009   HDL 55.10 02/28/2009   LDLCALC 106* 02/28/2009   ALT 25 01/03/2011   AST 23 01/03/2011   NA 141 08/11/2011   K 4.7 08/11/2011   CL 106 08/11/2011   CREATININE 0.7 08/11/2011   BUN 14 08/11/2011   CO2 26 08/11/2011   TSH 0.60 01/03/2011   HGBA1C 5.7 08/11/2011     Procedure Note :     Procedure : Joint Injection, L  knee   Indication:  Joint osteoarthritis with refractory  chronic pain.   Risks including unsuccessful procedure , bleeding, infection, bruising, skin atrophy and others were explained to the patient in detail as well  as the benefits. Informed consent was obtained and signed.   Tthe patient was placed in a comfortable position. Medial approach was used. Skin was prepped with Betadine and alcohol  and anesthetized a cooling spray. Then, a 5 cc syringe with a 1.5 inch long 25-gauge needle was used for a joint injection.. The needle was advanced  Into the knee joint cavity. I aspirated a small amount of intra-articular fluid to confirm correct placement of the needle and injected the  joint with 5 mL of 2% lidocaine and 40 mg of Depo-Medrol .  Band-Aid was applied.   Tolerated well. Complications: None. Good pain relief following the procedure.   Postprocedure instructions :    A Band-Aid should be left on for 12 hours. Injection therapy is not a cure itself. It is used in conjunction with other modalities. You can use nonsteroidal anti-inflammatories like ibuprofen , hot and cold compresses. Rest is recommended in the next 24 hours. You need to report immediately  if fever, chills or any signs of infection develop.     Assessment & Plan:

## 2012-04-27 NOTE — Assessment & Plan Note (Signed)
Options discussed - asked for an injection

## 2012-04-28 MED ORDER — METHYLPREDNISOLONE ACETATE 80 MG/ML IJ SUSP: 40.0000 mg | Freq: Once | INTRAMUSCULAR | Status: DC

## 2012-06-13 ENCOUNTER — Other Ambulatory Visit: Payer: Self-pay | Admitting: Internal Medicine

## 2012-09-06 ENCOUNTER — Other Ambulatory Visit (INDEPENDENT_AMBULATORY_CARE_PROVIDER_SITE_OTHER): Payer: Medicare Other

## 2012-09-06 ENCOUNTER — Emergency Department (HOSPITAL_BASED_OUTPATIENT_CLINIC_OR_DEPARTMENT_OTHER)
Admission: EM | Admit: 2012-09-06 | Discharge: 2012-09-06 | Disposition: A | Discharge status: Home / Self Care | Payer: Medicare Other | Attending: Emergency Medicine | Admitting: Emergency Medicine

## 2012-09-06 ENCOUNTER — Encounter (HOSPITAL_BASED_OUTPATIENT_CLINIC_OR_DEPARTMENT_OTHER): Payer: Self-pay

## 2012-09-06 DIAGNOSIS — Z79899 Other long term (current) drug therapy: Secondary | ICD-10-CM | POA: Diagnosis not present

## 2012-09-06 DIAGNOSIS — Z23 Encounter for immunization: Secondary | ICD-10-CM

## 2012-09-06 DIAGNOSIS — Z8679 Personal history of other diseases of the circulatory system: Secondary | ICD-10-CM | POA: Insufficient documentation

## 2012-09-06 DIAGNOSIS — Z8619 Personal history of other infectious and parasitic diseases: Secondary | ICD-10-CM | POA: Insufficient documentation

## 2012-09-06 DIAGNOSIS — E669 Obesity, unspecified: Secondary | ICD-10-CM | POA: Insufficient documentation

## 2012-09-06 DIAGNOSIS — L02519 Cutaneous abscess of unspecified hand: Secondary | ICD-10-CM | POA: Diagnosis not present

## 2012-09-06 DIAGNOSIS — L03119 Cellulitis of unspecified part of limb: Secondary | ICD-10-CM | POA: Diagnosis not present

## 2012-09-06 DIAGNOSIS — Z8673 Personal history of transient ischemic attack (TIA), and cerebral infarction without residual deficits: Secondary | ICD-10-CM | POA: Diagnosis not present

## 2012-09-06 DIAGNOSIS — Z862 Personal history of diseases of the blood and blood-forming organs and certain disorders involving the immune mechanism: Secondary | ICD-10-CM | POA: Insufficient documentation

## 2012-09-06 DIAGNOSIS — M1990 Unspecified osteoarthritis, unspecified site: Secondary | ICD-10-CM | POA: Diagnosis not present

## 2012-09-06 DIAGNOSIS — Z7982 Long term (current) use of aspirin: Secondary | ICD-10-CM | POA: Diagnosis not present

## 2012-09-06 DIAGNOSIS — Z8659 Personal history of other mental and behavioral disorders: Secondary | ICD-10-CM | POA: Diagnosis not present

## 2012-09-06 DIAGNOSIS — Z87891 Personal history of nicotine dependence: Secondary | ICD-10-CM | POA: Diagnosis not present

## 2012-09-06 DIAGNOSIS — I10 Essential (primary) hypertension: Secondary | ICD-10-CM | POA: Insufficient documentation

## 2012-09-06 DIAGNOSIS — Z8639 Personal history of other endocrine, nutritional and metabolic disease: Secondary | ICD-10-CM | POA: Insufficient documentation

## 2012-09-06 DIAGNOSIS — L0390 Cellulitis, unspecified: Secondary | ICD-10-CM

## 2012-09-06 LAB — BASIC METABOLIC PANEL
CO2: 28 mEq/L (ref 19–32)
Chloride: 105 mEq/L (ref 96–112)
Glucose, Bld: 101 mg/dL — ABNORMAL HIGH (ref 70–99)
Potassium: 4 mEq/L (ref 3.5–5.1)
Sodium: 140 mEq/L (ref 135–145)

## 2012-09-06 LAB — SEDIMENTATION RATE: Sed Rate: 76 mm/hr — ABNORMAL HIGH (ref 0–22)

## 2012-09-06 LAB — TSH: TSH: 1.14 u[IU]/mL (ref 0.35–5.50)

## 2012-09-06 LAB — CBC WITH DIFFERENTIAL/PLATELET
Basophils Absolute: 0 10*3/uL (ref 0.0–0.1)
Eosinophils Relative: 1.1 % (ref 0.0–5.0)
Monocytes Absolute: 0.5 10*3/uL (ref 0.1–1.0)
Monocytes Relative: 8.7 % (ref 3.0–12.0)
Neutrophils Relative %: 61 % (ref 43.0–77.0)
Platelets: 214 10*3/uL (ref 150.0–400.0)
WBC: 5.8 10*3/uL (ref 4.5–10.5)

## 2012-09-06 LAB — URINALYSIS, ROUTINE W REFLEX MICROSCOPIC
Specific Gravity, Urine: >=1.03 (ref 1.000–1.030)
Urobilinogen, UA: 0.2 (ref 0.0–1.0)

## 2012-09-06 LAB — HEPATIC FUNCTION PANEL
ALT: 12 U/L (ref 0–35)
AST: 14 U/L (ref 0–37)
Albumin: 3.6 g/dL (ref 3.5–5.2)
Alkaline Phosphatase: 78 U/L (ref 39–117)
Bilirubin, Direct: 0.2 mg/dL (ref 0.0–0.3)
Total Protein: 7.6 g/dL (ref 6.0–8.3)

## 2012-09-06 LAB — LIPID PANEL: Total CHOL/HDL Ratio: 4

## 2012-09-06 MED ORDER — CLINDAMYCIN HCL 150 MG PO CAPS: 150.0000 mg | ORAL_CAPSULE | Freq: Four times a day (QID) | ORAL | Status: DC

## 2012-09-06 NOTE — ED Provider Notes (Signed)
History     CSN: [id number]  Arrival date & time 09/06/12  4098   First MD Initiated Contact with Patient 09/06/12 1001      Chief Complaint  Patient presents with  . Hand Problem    (Consider location/radiation/quality/duration/timing/severity/associated sxs/prior treatment) The history is provided by the patient.   patient here complaining of right hand swelling x2 days. No history,. Denies any fever. She has been taking Motrin which is made the swelling improved. Pain is mostly at the wrist joint but has extended down to the hand. No rashes appreciated. He does feel warmer to attest to her. No prior history of same. Symptoms have been persistent and are worse with movement  Past Medical History  Diagnosis Date  . ANXIETY 03/05/2007  . ATAXIA 12/26/2009  . BEE STING 03/05/2007  . BRONCHITIS, ACUTE 06/28/2010  . CANDIDIASIS, VAGINAL 09/08/2008  . CEREBROVASCULAR ACCIDENT, HX OF 01/04/2010  . CERUMEN IMPACTION 06/05/2008  . DIZZINESS 12/26/2009  . Epistaxis 06/05/2008  . Headache 06/05/2008  . HYPERLIPIDEMIA 12/08/2006  . HYPERTENSION 12/08/2006  . 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 NECK PAIN 06/05/2008  . OBESITY 09/07/2009  . ONYCHOMYCOSIS 03/09/2009  . OSTEOARTHRITIS 12/08/2006  . PARESTHESIA 12/26/2009  . TOBACCO USE, QUIT 03/09/2009    Past Surgical History  Procedure Laterality Date  . Breast surgery    . Abdominal hysterectomy      Family History  Problem Relation Age of Onset  . Hypertension Other   . Hypertension Mother   . Hypertension Father     History  Substance Use Topics  . Smoking status: Former Games developer  . Smokeless tobacco: Not on file  . Alcohol Use: No    OB History   Grav Para Term Preterm Abortions TAB SAB Ect Mult Living                  Review of Systems  All other systems reviewed and are negative.    Allergies  Review of patient's allergies indicates no known allergies.  Home Medications   Current Outpatient Rx  Name  Route  Sig   Dispense  Refill  . amLODipine (NORVASC) 5 MG tablet      TAKE 1 TABLET BY MOUTH DAILY   90 tablet   3   . aspirin 81 MG tablet   Oral   Take 81 mg by mouth daily.           . Cholecalciferol (VITAMIN D) 1000 UNITS capsule   Oral   Take 1 capsule (1,000 Units total) by mouth daily.   100 capsule   3   . diclofenac (FLECTOR) 1.3 % PTCH   Transdermal   Place 1 patch onto the skin 2 (two) times daily.   60 patch   1   . diphenhydrAMINE (BENADRYL) 25 mg capsule      2 by mouth every 4 hours for allergies          . fish oil-omega-3 fatty acids 1000 MG capsule   Oral   Take 1 g by mouth daily.           . Garlic 1000 MG CAPS   Oral   Take 1 capsule by mouth daily.           [name] losartan (COZAAR) 100 MG tablet      TAKE 1 TABLET BY MOUTH DAILY FOR BLOOD PRESSURE   90 tablet   3   . metoprolol succinate (TOPROL-XL) 50 MG 24 hr tablet  Oral   Take 1 tablet (50 mg total) by mouth 2 (two) times daily.   180 tablet   0   . Mometasone Furo-Formoterol Fum 200-5 MCG/ACT AERO   Inhalation   Inhale 2 Act into the lungs 2 (two) times daily.           . Multiple Vitamin (MULTIVITAMIN) tablet   Oral   Take 1 tablet by mouth daily.           [name] EXPIRED: pravastatin (PRAVACHOL) 80 MG tablet   Oral   Take 40 mg by mouth every evening.          [name] EXPIRED: tiotropium (SPIRIVA) 18 MCG inhalation capsule   Inhalation   Place 18 mcg into inhaler and inhale daily.         . traMADol (ULTRAM) 50 MG tablet   Oral   Take 1-2 tablets (50-100 mg total) by mouth 2 (two) times daily as needed for pain.   100 tablet   3   . vitamin C (ASCORBIC ACID) 500 MG tablet   Oral   Take 500 mg by mouth daily.           . vitamin E 400 UNIT capsule   Oral   Take 400 Units by mouth daily.           . Vitamins A & D (VITAMIN A & D) 5000-400 UNITS CAPS   Oral   Take 1 capsule by mouth daily.             BP 167/98  Pulse 96  Temp(Src) 99.1 F (37.3 C) (Oral)   Resp 18  Ht 5\' 7"  (1.702 m)  Wt 240 lb (108.863 kg)  BMI 37.58 kg/m2  SpO2 100%  Physical Exam  Nursing note and vitals reviewed. Constitutional: She is oriented to person, place, and time. She appears well-developed and well-nourished.  Non-toxic appearance. No distress.  HENT:  Head: Normocephalic and atraumatic.  Eyes: Conjunctivae, EOM and lids are normal. Pupils are equal, round, and reactive to light.  Neck: Normal range of motion. Neck supple. No tracheal deviation present. No mass present.  Cardiovascular: Normal rate, regular rhythm and normal heart sounds.  Exam reveals no gallop.   No murmur heard. Pulmonary/Chest: Effort normal and breath sounds normal. No stridor. No respiratory distress. She has no decreased breath sounds. She has no wheezes. She has no rhonchi. She has no rales.  Abdominal: Soft. Normal appearance and bowel sounds are normal. She exhibits no distension. There is no tenderness. There is no rebound and no CVA tenderness.  Musculoskeletal: Normal range of motion. She exhibits no edema and no tenderness.       Hands: Neurological: She is alert and oriented to person, place, and time. She has normal strength. No cranial nerve deficit or sensory deficit. GCS eye subscore is 4. GCS verbal subscore is 5. GCS motor subscore is 6.  Skin: Skin is warm and dry. No abrasion and no rash noted.  Psychiatric: She has a normal mood and affect. Her speech is normal and behavior is normal.    ED Course  Procedures (including critical care time)  Labs Reviewed - No data to display No results found.   No diagnosis found.    MDM  Patient with cellulitis versus arthritis. Do not think that she has a septic joint. We'll prescribe Keflex and patient will continue taking Motrin and patient followup with her primary care Dr. and 48 hrs  Toy Baker, MD 09/06/12 1012

## 2012-09-06 NOTE — ED Notes (Addendum)
Pt states that she has swelling and pain in her R hand and pain x1 month, swelling for the past week, obvious swelling, redness, and warm to touch.

## 2012-09-08 ENCOUNTER — Encounter: Payer: Self-pay | Admitting: Internal Medicine

## 2012-09-08 ENCOUNTER — Ambulatory Visit (INDEPENDENT_AMBULATORY_CARE_PROVIDER_SITE_OTHER): Payer: Medicare Other | Admitting: Internal Medicine

## 2012-09-08 ENCOUNTER — Other Ambulatory Visit (INDEPENDENT_AMBULATORY_CARE_PROVIDER_SITE_OTHER): Payer: Medicare Other

## 2012-09-08 ENCOUNTER — Telehealth: Payer: Self-pay | Admitting: Internal Medicine

## 2012-09-08 ENCOUNTER — Ambulatory Visit (INDEPENDENT_AMBULATORY_CARE_PROVIDER_SITE_OTHER)
Admission: RE | Admit: 2012-09-08 | Discharge: 2012-09-08 | Disposition: A | Discharge status: Home / Self Care | Payer: Medicare Other | Source: Ambulatory Visit | Attending: Internal Medicine | Admitting: Internal Medicine

## 2012-09-08 VITALS — BP 158/98 | HR 80 | Temp 98.5°F | Resp 16 | Wt 237.0 lb

## 2012-09-08 DIAGNOSIS — M79609 Pain in unspecified limb: Secondary | ICD-10-CM

## 2012-09-08 DIAGNOSIS — R739 Hyperglycemia, unspecified: Secondary | ICD-10-CM

## 2012-09-08 DIAGNOSIS — M79641 Pain in right hand: Secondary | ICD-10-CM

## 2012-09-08 DIAGNOSIS — I6350 Cerebral infarction due to unspecified occlusion or stenosis of unspecified cerebral artery: Secondary | ICD-10-CM

## 2012-09-08 DIAGNOSIS — R7309 Other abnormal glucose: Secondary | ICD-10-CM

## 2012-09-08 DIAGNOSIS — I10 Essential (primary) hypertension: Secondary | ICD-10-CM

## 2012-09-08 DIAGNOSIS — M19049 Primary osteoarthritis, unspecified hand: Secondary | ICD-10-CM | POA: Diagnosis not present

## 2012-09-08 MED ORDER — IBUPROFEN 600 MG PO TABS: 600.0000 mg | ORAL_TABLET | Freq: Three times a day (TID) | ORAL | Status: DC | PRN

## 2012-09-08 MED ORDER — METOPROLOL SUCCINATE ER 50 MG PO TB24: 50.0000 mg | ORAL_TABLET | Freq: Two times a day (BID) | ORAL | Status: DC

## 2012-09-08 MED ORDER — TRAMADOL HCL 50 MG PO TABS: 50.0000 mg | ORAL_TABLET | Freq: Two times a day (BID) | ORAL | Status: DC | PRN

## 2012-09-08 MED ORDER — PRAVASTATIN SODIUM 40 MG PO TABS: 40.0000 mg | ORAL_TABLET | Freq: Every day | ORAL | Status: DC

## 2012-09-08 MED ORDER — METHYLPREDNISOLONE ACETATE 80 MG/ML IJ SUSP
80.0000 mg | Freq: Once | INTRAMUSCULAR | Status: AC
  Administered 2012-09-08: 80 mg via INTRAMUSCULAR

## 2012-09-08 NOTE — Progress Notes (Signed)
   Subjective:    HPI  C/o R hand and r wrist swellingand pain x 1 mo - she is s/p ER eval (given Keflex and Ibuprofen) - better F/u L knee pain - resolved The patient presents for a follow-up of  chronic hypertension, chronic dyslipidemia.   BP Readings from Last 3 Encounters:  09/08/12 158/98  09/06/12 167/98  04/27/12 120/72     Wt Readings from Last 3 Encounters:  09/08/12 237 lb (107.502 kg)  09/06/12 240 lb (108.863 kg)  04/27/12 243 lb (110.224 kg)    Review of Systems  Constitutional: Negative for chills, activity change, appetite change, fatigue and unexpected weight change.  HENT: Negative for congestion, mouth sores and sinus pressure.   Eyes: Negative for visual disturbance.  Respiratory: Negative for cough and chest tightness.   Gastrointestinal: Negative for nausea and abdominal pain.  Genitourinary: Negative for frequency, difficulty urinating and vaginal pain.  Musculoskeletal: Positive for back pain (neck pain). Negative for gait problem.  Skin: Negative for pallor and rash.  Neurological: Negative for dizziness, tremors, weakness and headaches.  Psychiatric/Behavioral: Negative for confusion and sleep disturbance.       Objective:   Physical Exam  Constitutional: She appears well-developed and well-nourished. No distress.  Obese   HENT:  Head: Normocephalic.  Right Ear: External ear normal.  Left Ear: External ear normal.  Nose: Nose normal.  Mouth/Throat: Oropharynx is clear and moist.  Eyes: Conjunctivae are normal. Pupils are equal, round, and reactive to light. Right eye exhibits no discharge. Left eye exhibits no discharge.  Neck: Normal range of motion. Neck supple. No JVD present. No tracheal deviation present. No thyromegaly present.  Cardiovascular: Normal rate, regular rhythm and normal heart sounds.   Pulmonary/Chest: No stridor. No respiratory distress. She has no wheezes.  Abdominal: Soft. Bowel sounds are normal. She exhibits no  distension and no mass. There is no tenderness. There is no rebound and no guarding.  Musculoskeletal: She exhibits no edema and no tenderness (B neck and occip nerve areas).  Lymphadenopathy:    She has no cervical adenopathy.  Neurological: She displays normal reflexes. No cranial nerve deficit. She exhibits normal muscle tone. Coordination normal.  Skin: No rash noted. No erythema.  Psychiatric: She has a normal mood and affect. Her behavior is normal. Judgment and thought content normal.  L knee is tender   Lab Results  Component Value Date   WBC 5.8 09/06/2012   HGB 11.9* 09/06/2012   HCT 35.8* 09/06/2012   PLT 214.0 09/06/2012   GLUCOSE 101* 09/06/2012   CHOL 129 09/06/2012   TRIG 63.0 09/06/2012   HDL 33.90* 09/06/2012   LDLCALC 83 09/06/2012   ALT 12 09/06/2012   AST 14 09/06/2012   NA 140 09/06/2012   K 4.0 09/06/2012   CL 105 09/06/2012   CREATININE 0.8 09/06/2012   BUN 11 09/06/2012   CO2 28 09/06/2012   TSH 1.14 09/06/2012   HGBA1C 5.7 08/11/2011         Assessment & Plan:

## 2012-09-08 NOTE — Telephone Encounter (Signed)
Mindy Taylor, please, inform patient that her x ray showed OA Thx

## 2012-09-08 NOTE — Assessment & Plan Note (Signed)
4/14 - arthritis X ay Labs to r/o RA, gout

## 2012-09-08 NOTE — Assessment & Plan Note (Signed)
Watching  

## 2012-09-08 NOTE — Assessment & Plan Note (Signed)
Continue with current prescription therapy as reflected on the Med list.  

## 2012-09-09 NOTE — Assessment & Plan Note (Signed)
No relapse Treat HTN

## 2012-09-09 NOTE — Telephone Encounter (Signed)
Pt informed

## 2012-09-16 DIAGNOSIS — H524 Presbyopia: Secondary | ICD-10-CM | POA: Diagnosis not present

## 2012-09-16 DIAGNOSIS — H2510 Age-related nuclear cataract, unspecified eye: Secondary | ICD-10-CM | POA: Diagnosis not present

## 2012-09-16 DIAGNOSIS — H40019 Open angle with borderline findings, low risk, unspecified eye: Secondary | ICD-10-CM | POA: Diagnosis not present

## 2012-09-17 ENCOUNTER — Ambulatory Visit: Payer: Medicare Other | Admitting: Internal Medicine

## 2012-09-21 ENCOUNTER — Emergency Department (HOSPITAL_COMMUNITY)
Admission: EM | Admit: 2012-09-21 | Discharge: 2012-09-21 | Disposition: A | Discharge status: Home / Self Care | Payer: Medicare Other | Attending: Emergency Medicine | Admitting: Emergency Medicine

## 2012-09-21 ENCOUNTER — Emergency Department (HOSPITAL_COMMUNITY): Payer: Medicare Other

## 2012-09-21 ENCOUNTER — Encounter (HOSPITAL_COMMUNITY): Payer: Self-pay | Admitting: Neurology

## 2012-09-21 DIAGNOSIS — Z7982 Long term (current) use of aspirin: Secondary | ICD-10-CM | POA: Diagnosis not present

## 2012-09-21 DIAGNOSIS — R229 Localized swelling, mass and lump, unspecified: Secondary | ICD-10-CM | POA: Insufficient documentation

## 2012-09-21 DIAGNOSIS — R42 Dizziness and giddiness: Secondary | ICD-10-CM | POA: Insufficient documentation

## 2012-09-21 DIAGNOSIS — E785 Hyperlipidemia, unspecified: Secondary | ICD-10-CM | POA: Diagnosis not present

## 2012-09-21 DIAGNOSIS — M1990 Unspecified osteoarthritis, unspecified site: Secondary | ICD-10-CM | POA: Diagnosis not present

## 2012-09-21 DIAGNOSIS — L03119 Cellulitis of unspecified part of limb: Secondary | ICD-10-CM | POA: Diagnosis not present

## 2012-09-21 DIAGNOSIS — R002 Palpitations: Secondary | ICD-10-CM | POA: Insufficient documentation

## 2012-09-21 DIAGNOSIS — Z87891 Personal history of nicotine dependence: Secondary | ICD-10-CM | POA: Diagnosis not present

## 2012-09-21 DIAGNOSIS — Z8619 Personal history of other infectious and parasitic diseases: Secondary | ICD-10-CM | POA: Insufficient documentation

## 2012-09-21 DIAGNOSIS — R0789 Other chest pain: Secondary | ICD-10-CM | POA: Diagnosis not present

## 2012-09-21 DIAGNOSIS — Z79899 Other long term (current) drug therapy: Secondary | ICD-10-CM | POA: Diagnosis not present

## 2012-09-21 DIAGNOSIS — Z8739 Personal history of other diseases of the musculoskeletal system and connective tissue: Secondary | ICD-10-CM | POA: Diagnosis not present

## 2012-09-21 DIAGNOSIS — E669 Obesity, unspecified: Secondary | ICD-10-CM | POA: Diagnosis not present

## 2012-09-21 DIAGNOSIS — F411 Generalized anxiety disorder: Secondary | ICD-10-CM | POA: Diagnosis not present

## 2012-09-21 DIAGNOSIS — I10 Essential (primary) hypertension: Secondary | ICD-10-CM | POA: Insufficient documentation

## 2012-09-21 DIAGNOSIS — L0390 Cellulitis, unspecified: Secondary | ICD-10-CM

## 2012-09-21 DIAGNOSIS — M7989 Other specified soft tissue disorders: Secondary | ICD-10-CM | POA: Diagnosis not present

## 2012-09-21 DIAGNOSIS — R0602 Shortness of breath: Secondary | ICD-10-CM | POA: Insufficient documentation

## 2012-09-21 DIAGNOSIS — Z8679 Personal history of other diseases of the circulatory system: Secondary | ICD-10-CM | POA: Diagnosis not present

## 2012-09-21 DIAGNOSIS — Z8669 Personal history of other diseases of the nervous system and sense organs: Secondary | ICD-10-CM | POA: Insufficient documentation

## 2012-09-21 DIAGNOSIS — Z8673 Personal history of transient ischemic attack (TIA), and cerebral infarction without residual deficits: Secondary | ICD-10-CM | POA: Insufficient documentation

## 2012-09-21 DIAGNOSIS — L02519 Cutaneous abscess of unspecified hand: Secondary | ICD-10-CM | POA: Diagnosis not present

## 2012-09-21 LAB — BASIC METABOLIC PANEL
BUN: 14 mg/dL (ref 6–23)
Calcium: 9.5 mg/dL (ref 8.4–10.5)
Chloride: 102 mEq/L (ref 96–112)
Creatinine, Ser: 0.67 mg/dL (ref 0.50–1.10)
GFR calc Af Amer: >90 mL/min (ref >90)

## 2012-09-21 LAB — CBC WITH DIFFERENTIAL/PLATELET
Basophils Relative: 0 % (ref 0–1)
Eosinophils Relative: 1 % (ref 0–5)
HCT: 34 % — ABNORMAL LOW (ref 36.0–46.0)
Hemoglobin: 11.6 g/dL — ABNORMAL LOW (ref 12.0–15.0)
MCH: 29.7 pg (ref 26.0–34.0)
MCHC: 34.1 g/dL (ref 30.0–36.0)
MCV: 87 fL (ref 78.0–100.0)
Monocytes Absolute: 0.4 10*3/uL (ref 0.1–1.0)
Monocytes Relative: 5 % (ref 3–12)
Neutro Abs: 5 10*3/uL (ref 1.7–7.7)
RDW: 13.9 % (ref 11.5–15.5)

## 2012-09-21 MED ORDER — SULFAMETHOXAZOLE-TRIMETHOPRIM 800-160 MG PO TABS: 1.0000 | ORAL_TABLET | Freq: Two times a day (BID) | ORAL | Status: AC

## 2012-09-21 NOTE — ED Provider Notes (Signed)
History     CSN: [id number]  Arrival date & time 09/21/12  1191   First MD Initiated Contact with Patient 09/21/12 [phone]      Chief Complaint  Patient presents with  . Cellulitis    (Consider location/radiation/quality/duration/timing/severity/associated sxs/prior treatment) HPI Comments: Patient is an 79 year old female with a history of hypertension who presents for swelling of her right hand. Patient was seen in the emergency department on 09/06/2012 for the same complaint and found to have a cellulitis. Patient was discharged with clindamycin to take for symptoms. Patient states that she took the full course of antibiotics with much improvement in the swelling and redness, though she states she began to notice a mild increase in swelling yesterday. Patient also states that she developed a central, non-radiating chest tightness yesterday which worsened with exertion; chest tightness was associated with lightheaded/dizzy sensation, palpitations ("feeling like my heart was racing") and shortness of breath. Patient admits to having 2 episodes since yesterday. Patient denies fevers, LOC, vision changes, numbness or tingling in her extremities, N/V, and abdominal pain.  The history is provided by the patient. No language interpreter was used.    Past Medical History  Diagnosis Date  . ANXIETY 03/05/2007  . ATAXIA 12/26/2009  . BEE STING 03/05/2007  . BRONCHITIS, ACUTE 06/28/2010  . CANDIDIASIS, VAGINAL 09/08/2008  . CEREBROVASCULAR ACCIDENT, HX OF 01/04/2010  . CERUMEN IMPACTION 06/05/2008  . DIZZINESS 12/26/2009  . Epistaxis 06/05/2008  . Headache 06/05/2008  . HYPERLIPIDEMIA 12/08/2006  . HYPERTENSION 12/08/2006  . 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 NECK PAIN 06/05/2008  . OBESITY 09/07/2009  . ONYCHOMYCOSIS 03/09/2009  . OSTEOARTHRITIS 12/08/2006  . PARESTHESIA 12/26/2009  . TOBACCO USE, QUIT 03/09/2009    Past Surgical History  Procedure Laterality Date  . Breast surgery    . Abdominal  hysterectomy      Family History  Problem Relation Age of Onset  . Hypertension Other   . Hypertension Mother   . Hypertension Father     History  Substance Use Topics  . Smoking status: Former Games developer  . Smokeless tobacco: Not on file  . Alcohol Use: No    OB History   Grav Para Term Preterm Abortions TAB SAB Ect Mult Living                  Review of Systems  Constitutional: Negative for fever.  Eyes: Negative for visual disturbance.  Respiratory: Positive for chest tightness and shortness of breath.   Cardiovascular: Positive for palpitations.  Gastrointestinal: Negative for nausea and vomiting.  Musculoskeletal:       +R hand swelling  Skin: Negative for color change.  Neurological: Positive for dizziness and light-headedness. Negative for syncope and numbness.  All other systems reviewed and are negative.    Allergies  Bee venom and Spider antivenin  Home Medications   Current Outpatient Rx  Name  Route  Sig  Dispense  Refill  . amLODipine (NORVASC) 5 MG tablet      TAKE 1 TABLET BY MOUTH DAILY   90 tablet   3   . aspirin 81 MG tablet   Oral   Take 81 mg by mouth daily.           . Cholecalciferol (VITAMIN D) 1000 UNITS capsule   Oral   Take 1 capsule (1,000 Units total) by mouth daily.   100 capsule   3   . fish oil-omega-3 fatty acids 1000 MG capsule   Oral   Take  1 g by mouth daily.           [name] ibuprofen (ADVIL,MOTRIN) 600 MG tablet   Oral   Take 1 tablet (600 mg total) by mouth every 8 (eight) hours as needed for pain.   60 tablet   0   . losartan (COZAAR) 100 MG tablet      TAKE 1 TABLET BY MOUTH DAILY FOR BLOOD PRESSURE   90 tablet   3   . metoprolol succinate (TOPROL-XL) 50 MG 24 hr tablet   Oral   Take 1 tablet (50 mg total) by mouth 2 (two) times daily.   180 tablet   3   . Multiple Vitamin (MULTIVITAMIN) tablet   Oral   Take 1 tablet by mouth daily.           . pravastatin (PRAVACHOL) 40 MG tablet   Oral    Take 1 tablet (40 mg total) by mouth daily.   90 tablet   3     Dispense as written.   . vitamin C (ASCORBIC ACID) 500 MG tablet   Oral   Take 500 mg by mouth daily.           . vitamin E 400 UNIT capsule   Oral   Take 400 Units by mouth daily.           . Vitamins A & D (VITAMIN A & D) 5000-400 UNITS CAPS   Oral   Take 1 capsule by mouth daily.           [name] sulfamethoxazole-trimethoprim (BACTRIM DS,SEPTRA DS) 800-160 MG per tablet   Oral   Take 1 tablet by mouth 2 (two) times daily.   14 tablet   0     BP 155/71  Pulse 73  Temp(Src) 99.1 F (37.3 C) (Oral)  Resp 15  SpO2 100%  Physical Exam  Nursing note and vitals reviewed. Constitutional: She is oriented to person, place, and time. She appears well-developed and well-nourished. No distress.  HENT:  Head: Normocephalic and atraumatic.  Mouth/Throat: Oropharynx is clear and moist. No oropharyngeal exudate.  Eyes: Conjunctivae and EOM are normal. Pupils are equal, round, and reactive to light. No scleral icterus.  Neck: Normal range of motion. Neck supple.  Cardiovascular: Normal rate, regular rhythm, normal heart sounds and intact distal pulses.   Pulmonary/Chest: Effort normal and breath sounds normal. No respiratory distress. She has no wheezes. She has no rales.  Abdominal: Soft. She exhibits no distension and no mass. There is no tenderness. There is no rebound and no guarding.  Musculoskeletal: Normal range of motion. She exhibits no tenderness.  + soft tissue swelling of the R hand with mild warmth to touch and without erythema. No decreased ROM or sensory deficits appreciated. Intact distal pulses and capillary refill normal.  Neurological: She is alert and oriented to person, place, and time.  Skin: Skin is warm and dry. No rash noted. She is not diaphoretic. No erythema.  Psychiatric: She has a normal mood and affect. Her behavior is normal.    ED Course  Procedures (including critical care  time)  Labs Reviewed  CBC WITH DIFFERENTIAL - Abnormal; Notable for the following:    Hemoglobin 11.6 (*)    HCT 34.0 (*)    All other components within normal limits  BASIC METABOLIC PANEL - Abnormal; Notable for the following:    Glucose, Bld 100 (*)    GFR calc non Af Amer 80 (*)    All  other components within normal limits  PRO B NATRIURETIC PEPTIDE  POCT I-STAT TROPONIN I   Dg Chest 2 View  09/21/2012  *RADIOLOGY REPORT*  Clinical Data: Shortness of breath, dizziness  CHEST - 2 VIEW  Comparison: 12/11/2010  Findings: Cardiomediastinal silhouette is stable.  Mild degenerative changes thoracic spine.  No acute infiltrate or pleural effusion.  No pulmonary edema.  Surgical clips in the left upper abdomen.  IMPRESSION: No active disease.  No significant change.   Original Report Authenticated By: Natasha Mead, M.D.      Date: 09/21/2012  Rate: 72  Rhythm: normal sinus rhythm  QRS Axis: normal  Intervals: normal  ST/T Wave abnormalities: normal  Conduction Disutrbances:none  Narrative Interpretation: NSR without STEMI  Old EKG Reviewed: none available I have personally reviewed and interpreted this EKG.   1. Cellulitis   2. Soft tissue swelling      MDM  Patient is an 79 year old female who presents for right hand swelling x 1 day. Previously seen and treated for same complaint; dx with cellulitis and given clindamycin x 7 days. Patient endorses improvement with clindamycin, but states that swelling began to recur yesterday. On physical exam there is soft tissue swelling of the right hand with mild warmth to palpation; neurovascularly intact with full ROM. She also admits to episodes of shortness of breath with palpitations and chest tightness x2 days. Will pursue cardiac workup to rule out ACS as cause of symptoms. Patient well and nontoxic appearing in no acute distress.  Patient's labs consistent with prior workups; troponin 0.00 and BNP is not elevated at 102.6. Chest x-ray and  EKG without evidence of acute cardiopulmonary disease. Low suspicion for ACS based on this work up; TIMI score 2 correlating with 8% mortality risk. Patient is hemodynamically stable with stable VS; appropriate for d/c with PCP follow up and another course of abx to cover for possible recurring cellulitis. Indications for ED return discussed. Patient states comfort and understanding with this discharge plan with no unaddressed concerns. Patient seen also by Dr. Denton Lank who is in agreement with this work up and management plan.   Filed Vitals:   09/21/12 0844 09/21/12 1124 09/21/12 1213  BP: 164/99 155/71 132/66  Pulse: 79 73 71  Temp: 99.1 F (37.3 C)    TempSrc: Oral    Resp: 15  17  SpO2: 98% 100% 100%         Antony Madura, PA-C 09/21/12 1608

## 2012-09-21 NOTE — ED Notes (Signed)
Pt reporting right hand pain yesterday, was treated for cellulitis on 4/14, treated with antibiotic. Pain and swelling was going away but pain increased yesterday and swelling. Pt ambulatory, a x 4. Radial pulse present. Reports dyspnea with exertion.

## 2012-09-22 NOTE — ED Provider Notes (Signed)
Medical screening examination/treatment/procedure(s) were conducted as a shared visit with non-physician practitioner(s) and myself.  I personally evaluated the patient during the encounter Pt c/o right hand swelling/redness dorsally. Hx cellulitis of same hand. No fever or chills. No hx inflammatory arthritis or gout.  Skin intact. Mild erythema and inc warmth to dorsum hand.   Suzi Roots, MD 09/22/12 [phone]

## 2012-10-11 ENCOUNTER — Ambulatory Visit (INDEPENDENT_AMBULATORY_CARE_PROVIDER_SITE_OTHER): Payer: Medicare Other

## 2012-10-11 ENCOUNTER — Encounter: Payer: Self-pay | Admitting: Internal Medicine

## 2012-10-11 ENCOUNTER — Ambulatory Visit (INDEPENDENT_AMBULATORY_CARE_PROVIDER_SITE_OTHER): Payer: Medicare Other | Admitting: Internal Medicine

## 2012-10-11 VITALS — BP 130/78 | HR 81 | Temp 98.9°F | Ht 67.0 in | Wt 225.0 lb

## 2012-10-11 DIAGNOSIS — R197 Diarrhea, unspecified: Secondary | ICD-10-CM | POA: Diagnosis not present

## 2012-10-11 DIAGNOSIS — K921 Melena: Secondary | ICD-10-CM

## 2012-10-11 LAB — BASIC METABOLIC PANEL
CO2: 26 mEq/L (ref 19–32)
Calcium: 9.4 mg/dL (ref 8.4–10.5)
Creatinine, Ser: 0.9 mg/dL (ref 0.4–1.2)
Glucose, Bld: 91 mg/dL (ref 70–99)

## 2012-10-11 LAB — CBC
HCT: 36.3 % (ref 36.0–46.0)
Hemoglobin: 12.2 g/dL (ref 12.0–15.0)
MCV: 89 fl (ref 78.0–100.0)
RBC: 4.07 Mil/uL (ref 3.87–5.11)
RDW: 14.6 % (ref 11.5–14.6)
WBC: 7.9 10*3/uL (ref 4.5–10.5)

## 2012-10-11 NOTE — Progress Notes (Signed)
Subjective:    Patient ID: Mindy Taylor, female    DOB: May 23, 1929, 79 y.o.   MRN: [id number]  HPI  Pt presents to the clinic today with c/o diarrhea. This started 2 week ago. She is going multiple times daily. Her stool is very loose. She has also had some associated nausea and abdominal cramping. She has also seen a small amount of blood on the tissue when she wiped. She has recently been treated with 2 rounds of antibiotics for cellulitis in her right hand. One of the antibiotics was Biaxin, the other was Septra. She has never had diarrhea like this that lasted this long. She has lost 15 pounds in the last month.  Review of Systems      Past Medical History  Diagnosis Date  . ANXIETY 03/05/2007  . ATAXIA 12/26/2009  . BEE STING 03/05/2007  . BRONCHITIS, ACUTE 06/28/2010  . CANDIDIASIS, VAGINAL 09/08/2008  . CEREBROVASCULAR ACCIDENT, HX OF 01/04/2010  . CERUMEN IMPACTION 06/05/2008  . DIZZINESS 12/26/2009  . Epistaxis 06/05/2008  . Headache 06/05/2008  . HYPERLIPIDEMIA 12/08/2006  . HYPERTENSION 12/08/2006  . 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 NECK PAIN 06/05/2008  . OBESITY 09/07/2009  . ONYCHOMYCOSIS 03/09/2009  . OSTEOARTHRITIS 12/08/2006  . PARESTHESIA 12/26/2009  . TOBACCO USE, QUIT 03/09/2009    Current Outpatient Prescriptions  Medication Sig Dispense Refill  . amLODipine (NORVASC) 5 MG tablet TAKE 1 TABLET BY MOUTH DAILY  90 tablet  3  . aspirin 81 MG tablet Take 81 mg by mouth daily.        . Cholecalciferol (VITAMIN D) 1000 UNITS capsule Take 1 capsule (1,000 Units total) by mouth daily.  100 capsule  3  . fish oil-omega-3 fatty acids 1000 MG capsule Take 1 g by mouth daily.        [name] ibuprofen (ADVIL,MOTRIN) 600 MG tablet Take 1 tablet (600 mg total) by mouth every 8 (eight) hours as needed for pain.  60 tablet  0  . losartan (COZAAR) 100 MG tablet TAKE 1 TABLET BY MOUTH DAILY FOR BLOOD PRESSURE  90 tablet  3  . metoprolol succinate (TOPROL-XL) 50 MG 24 hr tablet Take 1 tablet (50  mg total) by mouth 2 (two) times daily.  180 tablet  3  . Multiple Vitamin (MULTIVITAMIN) tablet Take 1 tablet by mouth daily.        . pravastatin (PRAVACHOL) 40 MG tablet Take 1 tablet (40 mg total) by mouth daily.  90 tablet  3  . vitamin C (ASCORBIC ACID) 500 MG tablet Take 500 mg by mouth daily.        . vitamin E 400 UNIT capsule Take 400 Units by mouth daily.        . Vitamins A & D (VITAMIN A & D) 5000-400 UNITS CAPS Take 1 capsule by mouth daily.         Current Facility-Administered Medications  Medication Dose Route Frequency Provider Last Rate Last Dose  . methylPREDNISolone acetate (DEPO-MEDROL) injection 80 mg  80 mg Intra-articular Once Tresa Garter, MD        Allergies  Allergen Reactions  . Bee Venom   . Spider Antivenin (Antivenin Latrodectus Mactans)     Family History  Problem Relation Age of Onset  . Hypertension Other   . Hypertension Mother   . Hypertension Father     History   Social History  . Marital Status: Widowed    Spouse Name: N/A    Number of Children:  N/A  . Years of Education: N/A   Occupational History  . Not on file.   Social History Main Topics  . Smoking status: Former Games developer  . Smokeless tobacco: Not on file  . Alcohol Use: No  . Drug Use: No  . Sexually Active: No   Other Topics Concern  . Not on file   Social History Narrative  . No narrative on file     Constitutional: Denies fever, malaise, fatigue, headache or abrupt weight changes.  Respiratory: Denies difficulty breathing, shortness of breath, cough or sputum production.   Cardiovascular: Denies chest pain, chest tightness, palpitations or swelling in the hands or feet.  Gastrointestinal: Pt reports diarrhea, blood in her stool and abdominal cramping. Denies bloating, constipation.  Neurological: Denies dizziness, difficulty with memory, difficulty with speech or problems with balance and coordination.   No other specific complaints in a complete review of  systems (except as listed in HPI above).  Objective:   Physical Exam   BP 130/78  Pulse 81  Temp(Src) 98.9 F (37.2 C) (Oral)  Ht 5\' 7"  (1.702 m)  Wt 225 lb (102.059 kg)  BMI 35.23 kg/m2  SpO2 98% Wt Readings from Last 3 Encounters:  10/11/12 225 lb (102.059 kg)  09/08/12 237 lb (107.502 kg)  09/06/12 240 lb (108.863 kg)    General: Appears her stated age, obese but well developed, well nourished in NAD. Cardiovascular: Normal rate and rhythm. S1,S2 noted.  No murmur, rubs or gallops noted. No JVD or BLE edema. No carotid bruits noted. Pulmonary/Chest: Normal effort and positive vesicular breath sounds. No respiratory distress. No wheezes, rales or ronchi noted.  Abdomen: Soft and nontender. Hyperactive bowel sounds, no bruits noted. No distention or masses noted. Liver, spleen and kidneys non palpable. Neurological: Alert and oriented. Cranial nerves II-XII intact. Coordination normal. +DTRs bilaterally.       Assessment & Plan:   Diarrhea with hematochezia:  Likely C diff Will obtain stool sample for culture Will obtain BMET and CBC Continue to drink fluids Do not take any antidiarrheals at this time Blood likely from hemorrhoids versus anal excoriation   Will f/u as soon as stool studies back.

## 2012-10-11 NOTE — Patient Instructions (Signed)
Chronic Diarrhea Diarrhea is loose, watery stools. Having diarrhea means passing loose stools 3 or more times a day. Diarrhea that lasts longer than 4 weeks is considered long-lasting (chronic). Symptoms of chronic diarrhea may be continual or may come and go. People of all ages can get diarrhea. Body fluid loss (dehydration) may occur as a result of diarrhea. This means the body does not have as many fluids and salts (electrolytes) as it needs. CAUSES  There are many causes of chronic diarrhea. Causes may be different for children and adults. The various causes can be grouped into 2 categories: diarrhea caused by an infection and diarrhea not caused by an infection. Sometimes, the cause is unknown. Diarrhea caused by an infection may result from:  Parasites.  Bacteria.  Viral infections. Diarrhea not caused by an infection may result from:  Irritable bowel syndrome.  Reaction to medicines, such as antibiotics, cancer drugs, blood pressure medicines, and antacids.  Intestinal disease (Crohn's disease, ulcerative colitis, celiac disease).  Food allergies or sensitivity to additives (fructose, lactose, sugar substitutes).  Tumors.  Diabetes, thyroid disease, and other endocrine diseases.  Reduced blood flow to the intestine.  Previous surgery or radiation of the abdomen or gastrointestinal tract. Risk factors for chronic diarrhea include:  Having a severely weakened immune system, such as from HIV/AIDS.  Taking certain types of cancer-fighting drugs (chemotherapy) or other medicines.  A recent organ transplant.  Having a portion of the stomach removed.  Traveling to countries where food and water supplies are often contaminated. SYMPTOMS  In addition to frequent, loose stools, diarrhea may cause:  Cramping.  Abdominal pain.  Nausea.  Urgent need to use the bathroom, or loss of bowel control. If dehydration occurs, problems include:  Thirst.  Less frequent  urination.  Dark urine.  Dry skin.  Fatigue.  Dizziness. Infections that cause diarrhea may also cause a fever, chills, or bloody stools. DIAGNOSIS  Diagnosis may be difficult. Your caregiver must take a careful history and perform a physical exam. Tests given are based on your symptoms and history. Tests may include:  Blood or stool tests, in which 3 or more stool samples may be examined. Stool cultures may be used to test for bacteria or parasites.  X-rays.  A procedure in which a thin tube is inserted into the mouth or rectum (endoscopy). This allows the caregiver to look inside the intestine. TREATMENT   Diarrhea caused by an infection can often be treated with antibiotics.  Diarrhea not caused by an infection is more difficult to diagnose and treat. Long-term medicine use or surgery may be required. Specific treatment should be discussed with your caregiver.  If the cause cannot determined, treatment to relieve symptoms includes:  Preventing dehydration. Serious health problems can occur if you do not maintain proper fluid levels. Many oral rehydration solutions (ORS) are available at drug stores. Ask your caregiver what product is best for you.  Not drinking beverages that contain caffeine (tea, coffee, soft drinks).  Not drinking alcohol. It causes dehydration.  Not relying on sports drinks and broths alone to maintain proper fluid levels. They should not be used to prevent severe dehydration.  Maintaining well-balanced nutrition. This may help you recover faster. PREVENTION   Drink clean or purified water.  Use proper food handling techniques.  Maintain proper hand-washing habits. HOME CARE INSTRUCTIONS   Avoid:  Caffeine.  Greasy foods.  High fiber.  If you have problems digesting lactose during or after an episode of diarrhea, you might   want to try yogurt. Yogurt is often better tolerated, because it has less lactose than milk. Yogurt with active, live  bacterial cultures may even help you recover faster. SEEK MEDICAL CARE IF:  The person with diarrhea is an otherwise healthy adult and has:  Signs of dehydration.  Diarrhea for more than 2 days.  Severe pain in the abdomen or rectum.  An oral temperature above 102 F (38.9 C).  Stools containing blood or pus.  Stools that are black and tarry. SEEK IMMEDIATE MEDICAL CARE IF:  The person with diarrhea is a child, elderly person, or has a weakened immune system and has:  Signs of dehydration.  Diarrhea for more than 1 day.  Severe pain in the abdomen or rectum.  An oral temperature above 102 F (38.9 C), not controlled by medicine.  Stools containing blood or pus.  Stools that are black and tarry. Document Released: 08/02/2003 Document Revised: 08/04/2011 Document Reviewed: 09/28/2009 Exit[employer] Patient Information 2013 [employer].  

## 2012-10-13 ENCOUNTER — Telehealth: Payer: Self-pay | Admitting: Internal Medicine

## 2012-10-13 NOTE — Telephone Encounter (Signed)
Pt is informed.

## 2012-10-13 NOTE — Telephone Encounter (Signed)
Requesting lab results from [facility] visit.

## 2012-10-13 NOTE — Telephone Encounter (Signed)
Labs are normal, still waiting on the stool sample she gave to be resulted

## 2012-10-22 ENCOUNTER — Ambulatory Visit (INDEPENDENT_AMBULATORY_CARE_PROVIDER_SITE_OTHER): Payer: Medicare Other | Admitting: Internal Medicine

## 2012-10-22 ENCOUNTER — Encounter: Payer: Self-pay | Admitting: Internal Medicine

## 2012-10-22 VITALS — BP 122/70 | HR 80 | Temp 98.8°F | Resp 16 | Wt 231.0 lb

## 2012-10-22 DIAGNOSIS — I10 Essential (primary) hypertension: Secondary | ICD-10-CM

## 2012-10-22 DIAGNOSIS — E669 Obesity, unspecified: Secondary | ICD-10-CM

## 2012-10-22 DIAGNOSIS — A0472 Enterocolitis due to Clostridium difficile, not specified as recurrent: Secondary | ICD-10-CM | POA: Diagnosis not present

## 2012-10-22 DIAGNOSIS — R5383 Other fatigue: Secondary | ICD-10-CM

## 2012-10-22 MED ORDER — ALIGN 4 MG PO CAPS: 1.0000 | ORAL_CAPSULE | Freq: Every day | ORAL | Status: DC

## 2012-10-22 NOTE — Progress Notes (Signed)
   Subjective:    HPI  F/u C diff (+) diarrhea - treated F/u L knee pain - resolved The patient presents for a follow-up of  chronic hypertension, chronic dyslipidemia.   BP Readings from Last 3 Encounters:  10/22/12 122/70  10/11/12 130/78  09/21/12 132/66     Wt Readings from Last 3 Encounters:  10/22/12 231 lb (104.781 kg)  10/11/12 225 lb (102.059 kg)  09/08/12 237 lb (107.502 kg)    Review of Systems  Constitutional: Negative for chills, activity change, appetite change, fatigue and unexpected weight change.  HENT: Negative for congestion, mouth sores and sinus pressure.   Eyes: Negative for visual disturbance.  Respiratory: Negative for cough and chest tightness.   Gastrointestinal: Negative for nausea and abdominal pain.  Genitourinary: Negative for frequency, difficulty urinating and vaginal pain.  Musculoskeletal: Positive for back pain (neck pain). Negative for gait problem.  Skin: Negative for pallor and rash.  Neurological: Negative for dizziness, tremors, weakness and headaches.  Psychiatric/Behavioral: Negative for confusion and sleep disturbance.       Objective:   Physical Exam  Constitutional: She appears well-developed and well-nourished. No distress.  Obese   HENT:  Head: Normocephalic.  Right Ear: External ear normal.  Left Ear: External ear normal.  Nose: Nose normal.  Mouth/Throat: Oropharynx is clear and moist.  Eyes: Conjunctivae are normal. Pupils are equal, round, and reactive to light. Right eye exhibits no discharge. Left eye exhibits no discharge.  Neck: Normal range of motion. Neck supple. No JVD present. No tracheal deviation present. No thyromegaly present.  Cardiovascular: Normal rate, regular rhythm and normal heart sounds.   Pulmonary/Chest: No stridor. No respiratory distress. She has no wheezes.  Abdominal: Soft. Bowel sounds are normal. She exhibits no distension and no mass. There is no tenderness. There is no rebound and no  guarding.  Musculoskeletal: She exhibits no edema and no tenderness (B neck and occip nerve areas).  Lymphadenopathy:    She has no cervical adenopathy.  Neurological: She displays normal reflexes. No cranial nerve deficit. She exhibits normal muscle tone. Coordination normal.  Skin: No rash noted. No erythema.  Psychiatric: She has a normal mood and affect. Her behavior is normal. Judgment and thought content normal.  L knee is tender   Lab Results  Component Value Date   WBC 7.9 10/11/2012   HGB 12.2 10/11/2012   HCT 36.3 10/11/2012   PLT 192.0 10/11/2012   GLUCOSE 91 10/11/2012   CHOL 129 09/06/2012   TRIG 63.0 09/06/2012   HDL 33.90* 09/06/2012   LDLCALC 83 09/06/2012   ALT 12 09/06/2012   AST 14 09/06/2012   NA 139 10/11/2012   K 4.2 10/11/2012   CL 104 10/11/2012   CREATININE 0.9 10/11/2012   BUN 18 10/11/2012   CO2 26 10/11/2012   TSH 1.14 09/06/2012   HGBA1C 5.7 08/11/2011         Assessment & Plan:

## 2012-10-22 NOTE — Assessment & Plan Note (Signed)
Better  

## 2012-10-22 NOTE — Assessment & Plan Note (Signed)
Continue with current prescription therapy as reflected on the Med list.  

## 2012-10-22 NOTE — Assessment & Plan Note (Signed)
Better  Wt Readings from Last 3 Encounters:  10/22/12 231 lb (104.781 kg)  10/11/12 225 lb (102.059 kg)  09/08/12 237 lb (107.502 kg)

## 2012-10-22 NOTE — Assessment & Plan Note (Signed)
Resolved Align x 1 mo

## 2012-10-26 ENCOUNTER — Other Ambulatory Visit: Payer: Self-pay | Admitting: Internal Medicine

## 2012-10-26 MED ORDER — METRONIDAZOLE 500 MG PO TABS: 500.0000 mg | ORAL_TABLET | Freq: Three times a day (TID) | ORAL | Status: DC

## 2013-01-03 ENCOUNTER — Encounter: Payer: Self-pay | Admitting: Internal Medicine

## 2013-01-03 ENCOUNTER — Telehealth: Payer: Self-pay | Admitting: Internal Medicine

## 2013-01-03 ENCOUNTER — Ambulatory Visit (INDEPENDENT_AMBULATORY_CARE_PROVIDER_SITE_OTHER): Payer: Medicare Other | Admitting: Internal Medicine

## 2013-01-03 VITALS — BP 138/76 | HR 91 | Temp 99.3°F | Resp 16 | Wt 234.0 lb

## 2013-01-03 DIAGNOSIS — M542 Cervicalgia: Secondary | ICD-10-CM

## 2013-01-03 DIAGNOSIS — G8929 Other chronic pain: Secondary | ICD-10-CM

## 2013-01-03 MED ORDER — METHOCARBAMOL 500 MG PO TABS
500.0000 mg | ORAL_TABLET | Freq: Four times a day (QID) | ORAL | Status: DC
Start: 2013-01-03 — End: 2013-04-01

## 2013-01-03 NOTE — Telephone Encounter (Signed)
Patient Information:  Caller Name: Linea  Phone: [phone]  Patient: Mindy Taylor, Mindy Taylor  Gender: Female  DOB: 03-06-1929  Age: 79 Years  PCP: Plotnikov, Alex (Adults only)  Office Follow Up:  Does the office need to follow up with this patient?: No  Instructions For The Office: N/A  RN Note:  Neck pain currently rated 7/10; pain worsens when turns head to left. Reported history of breast cancer. Needed at least 30 minutes to reach office.   Symptoms  Reason For Call & Symptoms: Neck spasms, worse on left side  Reviewed Health History In EMR: Yes  Reviewed Medications In EMR: Yes  Reviewed Allergies In EMR: Yes  Reviewed Surgeries / Procedures: Yes  Date of Onset of Symptoms: 01/02/2013  Treatments Tried: ice pack, muscle ointment, heat pack, Ibuprofen  Treatments Tried Worked: No  Guideline(s) Used:  Neck Pain or Stiffness  Disposition Per Guideline:   See Today in Office  Reason For Disposition Reached:   High-risk adult (e.g., history of cancer, HIV, or IV drug abuse)  Advice Given:  Reassurance:  Prolonged turning of the head or working in an awkward position can cause muscle pain in the back of the neck. With treatment, the pain usually resolves in 1 to 2 weeks.  Here is some care advice that should help.  Sleep:  Sleep on your back or side, not on your abdomen.  Sleep with a neck collar. Use a foam neck collar (from a pharmacy) OR a small towel wrapped around the neck (Reason: keep the head from moving too much during sleep).  Pain Medicines:  For pain relief, you can take either acetaminophen, ibuprofen, or naproxen.  They are over-the-counter (OTC) pain drugs. You can buy them at the drugstore.  Ibuprofen (e.g., Motrin, Advil):  Take 400 mg (two 200 mg pills) by mouth every 6 hours.  Another choice is to take 600 mg (three 200 mg pills) by mouth every 8 hours.  The most you should take each day is 1,200 mg (six 200 mg pills), unless your doctor has told you to take  more.  Avoid:   Avoid triggers that overstress the neck such as working with the neck turned or bent backward, carrying heavy objects on the head, carrying heavy objects with one arm (instead of both arms), standing on the head, contact sports or even friendly wrestling.  Call Back If:  Bowel or bladder problems occur  You become worse.  Patient Will Follow Care Advice:  YES  Appointment Scheduled:  01/03/2013 15:15:00 Appointment Scheduled Provider:  Sanda Linger (Adults only)

## 2013-01-03 NOTE — Patient Instructions (Signed)
Torticollis, Acute °You have suddenly (acutely) developed a twisted neck (torticollis). This is usually a self-limited condition. °CAUSES  °Acute torticollis may be caused by malposition, trauma or infection. Most commonly, acute torticollis is caused by sleeping in an awkward position. Torticollis may also be caused by the flexion, extension or twisting of the neck muscles beyond their normal position. Sometimes, the exact cause may not be known. °SYMPTOMS  °Usually, there is pain and limited movement of the neck. Your neck may twist to one side. °DIAGNOSIS  °The diagnosis is often made by physical examination. X-rays, CT scans or MRIs may be done if there is a history of trauma or concern of infection. °TREATMENT  °For a common, stiff neck that develops during sleep, treatment is focused on relaxing the contracted neck muscle. Medications (including shots) may be used to treat the problem. Most cases resolve in several days. Torticollis usually responds to conservative physical therapy. If left untreated, the shortened and spastic neck muscle can cause deformities in the face and neck. Rarely, surgery is required. °HOME CARE INSTRUCTIONS  °· Use over-the-counter and prescription medications as directed by your caregiver. °· Do stretching exercises and massage the neck as directed by your caregiver. °· Follow up with physical therapy if needed and as directed by your caregiver. °SEEK IMMEDIATE MEDICAL CARE IF:  °· You develop difficulty breathing or noisy breathing (stridor). °· You drool, develop trouble swallowing or have pain with swallowing. °· You develop numbness or weakness in the hands or feet. °· You have changes in speech or vision. °· You have problems with urination or bowel movements. °· You have difficulty walking. °· You have a fever. °· You have increased pain. °MAKE SURE YOU:  °· Understand these instructions. °· Will watch your condition. °· Will get help right away if you are not doing well or  get worse. °Document Released: 05/09/2000 Document Revised: 08/04/2011 Document Reviewed: 06/20/2009 °ExitCare® Patient Information ©2014 [employer]. ° °

## 2013-01-04 NOTE — Assessment & Plan Note (Signed)
She has prior evidence of cervical spine disease, I have asked her to get a set of plain films today to see is she has an occult fracture, spinal stenosis, spurring, etc. She will try a muscle relaxer in addition to the motrin 600 mg TID She did not want a narcotic med for pain

## 2013-01-04 NOTE — Progress Notes (Signed)
  Subjective:    Patient ID: Mindy Taylor, female    DOB: 14-Jun-1928, 79 y.o.   MRN: [id number]  Neck Pain  This is a new problem. The current episode started in the past 7 days. The problem occurs constantly. The problem has been unchanged. The pain is associated with nothing. The pain is present in the left side. The quality of the pain is described as aching. The pain is at a severity of 2/10. The pain is mild. The symptoms are aggravated by position. The pain is same all the time. Pertinent negatives include no chest pain, fever, headaches, leg pain, numbness, pain with swallowing, paresis, photophobia, syncope, tingling, trouble swallowing, visual change, weakness or weight loss. She has tried NSAIDs for the symptoms. The treatment provided moderate relief.      Review of Systems  Constitutional: Negative.  Negative for fever and weight loss.  HENT: Positive for neck pain. Negative for trouble swallowing.   Eyes: Negative.  Negative for photophobia.  Respiratory: Negative.   Cardiovascular: Negative.  Negative for chest pain, palpitations, leg swelling and syncope.  Gastrointestinal: Negative.  Negative for abdominal pain.  Endocrine: Negative.   Genitourinary: Negative.   Musculoskeletal: Negative for myalgias, back pain, joint swelling, arthralgias and gait problem.  Skin: Negative.   Allergic/Immunologic: Negative.   Neurological: Negative.  Negative for dizziness, tingling, speech difficulty, weakness, light-headedness, numbness and headaches.  Hematological: Negative.  Negative for adenopathy. Does not bruise/bleed easily.  Psychiatric/Behavioral: Negative.        Objective:   Physical Exam  Vitals reviewed. Constitutional: She is oriented to person, place, and time. She appears well-developed and well-nourished. No distress.  HENT:  Head: Normocephalic and atraumatic.  Mouth/Throat: No oropharyngeal exudate.  Eyes: Conjunctivae are normal. Right eye exhibits no discharge.  Left eye exhibits no discharge. No scleral icterus.  Neck: Normal range of motion. Neck supple. No JVD present. No tracheal deviation present. No thyromegaly present.  Cardiovascular: Normal rate, regular rhythm, normal heart sounds and intact distal pulses.  Exam reveals no gallop and no friction rub.   No murmur heard. Pulmonary/Chest: Effort normal and breath sounds normal. No stridor. No respiratory distress. She has no wheezes. She has no rales. She exhibits no tenderness.  Abdominal: Soft. Bowel sounds are normal. She exhibits no distension and no mass. There is no tenderness. There is no rebound and no guarding.  Musculoskeletal: Normal range of motion. She exhibits no edema and no tenderness.       Cervical back: She exhibits deformity. She exhibits normal range of motion, no tenderness, no bony tenderness, no swelling, no edema, no laceration, no pain, no spasm and normal pulse.  She has marked accentuation in her cervical lordotic curvature  Lymphadenopathy:    She has no cervical adenopathy.  Neurological: She is alert and oriented to person, place, and time. She has normal reflexes. She displays normal reflexes. No cranial nerve deficit. She exhibits normal muscle tone. Coordination normal.  Skin: Skin is warm and dry. No rash noted. She is not diaphoretic. No erythema. No pallor.  Psychiatric: She has a normal mood and affect. Her behavior is normal. Judgment and thought content normal.          Assessment & Plan:

## 2013-01-07 DIAGNOSIS — M1710 Unilateral primary osteoarthritis, unspecified knee: Secondary | ICD-10-CM | POA: Diagnosis not present

## 2013-03-04 ENCOUNTER — Ambulatory Visit (INDEPENDENT_AMBULATORY_CARE_PROVIDER_SITE_OTHER): Payer: Medicare Other | Admitting: Internal Medicine

## 2013-03-04 ENCOUNTER — Encounter: Payer: Self-pay | Admitting: Internal Medicine

## 2013-03-04 VITALS — BP 130/88 | HR 80 | Temp 98.7°F | Resp 16 | Wt 234.0 lb

## 2013-03-04 DIAGNOSIS — M25473 Effusion, unspecified ankle: Secondary | ICD-10-CM | POA: Diagnosis not present

## 2013-03-04 DIAGNOSIS — M25472 Effusion, left ankle: Secondary | ICD-10-CM

## 2013-03-04 DIAGNOSIS — Z23 Encounter for immunization: Secondary | ICD-10-CM

## 2013-03-04 NOTE — Assessment & Plan Note (Addendum)
Likely OA Ibuprofen Rx prn, ankle support RTC if not better in 1 wk

## 2013-03-04 NOTE — Patient Instructions (Signed)
Compression knee highs Use Ibuprofen

## 2013-03-06 ENCOUNTER — Encounter: Payer: Self-pay | Admitting: Internal Medicine

## 2013-03-06 NOTE — Progress Notes (Signed)
  Subjective:    HPI  C/o L ankle swelling x 1 month F/u L knee pain - resolved The patient presents for a follow-up of  chronic hypertension, chronic dyslipidemia.   BP Readings from Last 3 Encounters:  03/04/13 130/88  01/03/13 138/76  10/22/12 122/70     Wt Readings from Last 3 Encounters:  03/04/13 234 lb (106.142 kg)  01/03/13 234 lb (106.142 kg)  10/22/12 231 lb (104.781 kg)    Review of Systems  Constitutional: Negative for chills, activity change, appetite change, fatigue and unexpected weight change.  HENT: Negative for congestion, mouth sores and sinus pressure.   Eyes: Negative for visual disturbance.  Respiratory: Negative for cough and chest tightness.   Gastrointestinal: Negative for nausea and abdominal pain.  Genitourinary: Negative for frequency, difficulty urinating and vaginal pain.  Musculoskeletal: Positive for back pain (neck pain). Negative for gait problem.  Skin: Negative for pallor and rash.  Neurological: Negative for dizziness, tremors, weakness and headaches.  Psychiatric/Behavioral: Negative for confusion and sleep disturbance.       Objective:   Physical Exam  Constitutional: She appears well-developed and well-nourished. No distress.  Obese   HENT:  Head: Normocephalic.  Right Ear: External ear normal.  Left Ear: External ear normal.  Nose: Nose normal.  Mouth/Throat: Oropharynx is clear and moist.  Eyes: Conjunctivae are normal. Pupils are equal, round, and reactive to light. Right eye exhibits no discharge. Left eye exhibits no discharge.  Neck: Normal range of motion. Neck supple. No JVD present. No tracheal deviation present. No thyromegaly present.  Cardiovascular: Normal rate, regular rhythm and normal heart sounds.   Pulmonary/Chest: No stridor. No respiratory distress. She has no wheezes.  Abdominal: Soft. Bowel sounds are normal. She exhibits no distension and no mass. There is no tenderness. There is no rebound and no  guarding.  Musculoskeletal: She exhibits no edema and no tenderness (B neck and occip nerve areas).  Lymphadenopathy:    She has no cervical adenopathy.  Neurological: She displays normal reflexes. No cranial nerve deficit. She exhibits normal muscle tone. Coordination normal.  Skin: No rash noted. No erythema.  Psychiatric: She has a normal mood and affect. Her behavior is normal. Judgment and thought content normal.  L knee is not tender L ankle is swollen and a little tender   Lab Results  Component Value Date   WBC 7.9 10/11/2012   HGB 12.2 10/11/2012   HCT 36.3 10/11/2012   PLT 192.0 10/11/2012   GLUCOSE 91 10/11/2012   CHOL 129 09/06/2012   TRIG 63.0 09/06/2012   HDL 33.90* 09/06/2012   LDLCALC 83 09/06/2012   ALT 12 09/06/2012   AST 14 09/06/2012   NA 139 10/11/2012   K 4.2 10/11/2012   CL 104 10/11/2012   CREATININE 0.9 10/11/2012   BUN 18 10/11/2012   CO2 26 10/11/2012   TSH 1.14 09/06/2012   HGBA1C 5.7 08/11/2011         Assessment & Plan:

## 2013-03-10 ENCOUNTER — Telehealth: Payer: Self-pay | Admitting: *Deleted

## 2013-03-10 DIAGNOSIS — M25572 Pain in left ankle and joints of left foot: Secondary | ICD-10-CM

## 2013-03-10 NOTE — Telephone Encounter (Signed)
Pt called requesting a referral to evaluate her swelling left foot.  Please advise

## 2013-03-10 NOTE — Telephone Encounter (Signed)
Spoke with pt advised of MDs message 

## 2013-03-10 NOTE — Telephone Encounter (Signed)
Will ref to podiatry Thx

## 2013-04-01 ENCOUNTER — Ambulatory Visit (INDEPENDENT_AMBULATORY_CARE_PROVIDER_SITE_OTHER): Payer: Medicare Other | Admitting: Internal Medicine

## 2013-04-01 ENCOUNTER — Encounter: Payer: Self-pay | Admitting: Internal Medicine

## 2013-04-01 ENCOUNTER — Ambulatory Visit (INDEPENDENT_AMBULATORY_CARE_PROVIDER_SITE_OTHER)
Admission: RE | Admit: 2013-04-01 | Discharge: 2013-04-01 | Disposition: A | Discharge status: Home / Self Care | Payer: Medicare Other | Source: Ambulatory Visit | Attending: Internal Medicine | Admitting: Internal Medicine

## 2013-04-01 VITALS — BP 160/90 | HR 80 | Temp 100.0°F | Resp 16 | Wt 230.0 lb

## 2013-04-01 DIAGNOSIS — R062 Wheezing: Secondary | ICD-10-CM | POA: Diagnosis not present

## 2013-04-01 DIAGNOSIS — E669 Obesity, unspecified: Secondary | ICD-10-CM | POA: Diagnosis not present

## 2013-04-01 DIAGNOSIS — J209 Acute bronchitis, unspecified: Secondary | ICD-10-CM

## 2013-04-01 DIAGNOSIS — I10 Essential (primary) hypertension: Secondary | ICD-10-CM | POA: Diagnosis not present

## 2013-04-01 DIAGNOSIS — R05 Cough: Secondary | ICD-10-CM | POA: Diagnosis not present

## 2013-04-01 MED ORDER — PROMETHAZINE-CODEINE 6.25-10 MG/5ML PO SYRP: 5.0000 mL | ORAL_SOLUTION | ORAL | Status: DC | PRN

## 2013-04-01 MED ORDER — AZITHROMYCIN 250 MG PO TABS: ORAL_TABLET | ORAL | Status: DC

## 2013-04-01 NOTE — Assessment & Plan Note (Signed)
CXR Zpac Prom-cod 

## 2013-04-01 NOTE — Assessment & Plan Note (Signed)
Continue with current prescription therapy as reflected on the Med list.  

## 2013-04-01 NOTE — Progress Notes (Signed)
   Subjective:    Cough This is a new problem. The current episode started in the past 7 days. The problem has been gradually worsening. The problem occurs constantly. The cough is productive of sputum. Associated symptoms include a fever. Pertinent negatives include no chills, headaches or rash. There is no history of asthma.     The patient presents for a follow-up of  chronic hypertension, chronic dyslipidemia.   BP Readings from Last 3 Encounters:  04/01/13 160/90  03/04/13 130/88  01/03/13 138/76     Wt Readings from Last 3 Encounters:  04/01/13 230 lb (104.327 kg)  03/04/13 234 lb (106.142 kg)  01/03/13 234 lb (106.142 kg)    Review of Systems  Constitutional: Positive for fever. Negative for chills, activity change, appetite change, fatigue and unexpected weight change.  HENT: Negative for congestion, mouth sores and sinus pressure.   Eyes: Negative for visual disturbance.  Respiratory: Negative for cough and chest tightness.   Gastrointestinal: Negative for nausea and abdominal pain.  Genitourinary: Negative for frequency, difficulty urinating and vaginal pain.  Musculoskeletal: Positive for back pain (neck pain). Negative for gait problem.  Skin: Negative for pallor and rash.  Neurological: Negative for dizziness, tremors, weakness and headaches.  Psychiatric/Behavioral: Negative for confusion and sleep disturbance.       Objective:   Physical Exam  Constitutional: She appears well-developed and well-nourished. No distress.  Obese   HENT:  Head: Normocephalic.  Right Ear: External ear normal.  Left Ear: External ear normal.  Nose: Nose normal.  Mouth/Throat: Oropharynx is clear and moist.  Eyes: Conjunctivae are normal. Pupils are equal, round, and reactive to light. Right eye exhibits no discharge. Left eye exhibits no discharge.  Neck: Normal range of motion. Neck supple. No JVD present. No tracheal deviation present. No thyromegaly present.   Cardiovascular: Normal rate, regular rhythm and normal heart sounds.   Pulmonary/Chest: No stridor. No respiratory distress. She has no wheezes.  Abdominal: Soft. Bowel sounds are normal. She exhibits no distension and no mass. There is no tenderness. There is no rebound and no guarding.  Musculoskeletal: She exhibits no edema and no tenderness (B neck and occip nerve areas).  Lymphadenopathy:    She has no cervical adenopathy.  Neurological: She displays normal reflexes. No cranial nerve deficit. She exhibits normal muscle tone. Coordination normal.  Skin: No rash noted. No erythema.  Psychiatric: She has a normal mood and affect. Her behavior is normal. Judgment and thought content normal.  eryth throat Coughing a lot   Lab Results  Component Value Date   WBC 7.9 10/11/2012   HGB 12.2 10/11/2012   HCT 36.3 10/11/2012   PLT 192.0 10/11/2012   GLUCOSE 91 10/11/2012   CHOL 129 09/06/2012   TRIG 63.0 09/06/2012   HDL 33.90* 09/06/2012   LDLCALC 83 09/06/2012   ALT 12 09/06/2012   AST 14 09/06/2012   NA 139 10/11/2012   K 4.2 10/11/2012   CL 104 10/11/2012   CREATININE 0.9 10/11/2012   BUN 18 10/11/2012   CO2 26 10/11/2012   TSH 1.14 09/06/2012   HGBA1C 5.7 08/11/2011         Assessment & Plan:

## 2013-04-01 NOTE — Patient Instructions (Signed)
Use over-the-counter  "cold" medicines  such as  "Afrin" nasal spray for nasal congestion as directed instead. Use" Delsym" or" Robitussin" cough syrup varietis for cough.  You can use plain "Tylenol" or "Advil" for fever, chills and achyness.  Please, make an appointment if you are not better or if you're worse.  

## 2013-04-01 NOTE — Progress Notes (Signed)
Pre visit review using our clinic review tool, if applicable. No additional management support is needed unless otherwise documented below in the visit note. 

## 2013-04-01 NOTE — Assessment & Plan Note (Signed)
CXR

## 2013-04-01 NOTE — Assessment & Plan Note (Signed)
Wt Readings from Last 3 Encounters:  04/01/13 230 lb (104.327 kg)  03/04/13 234 lb (106.142 kg)  01/03/13 234 lb (106.142 kg)

## 2013-04-08 ENCOUNTER — Ambulatory Visit: Payer: Self-pay

## 2013-04-22 ENCOUNTER — Other Ambulatory Visit: Payer: Self-pay | Admitting: Internal Medicine

## 2013-04-26 ENCOUNTER — Ambulatory Visit (INDEPENDENT_AMBULATORY_CARE_PROVIDER_SITE_OTHER): Payer: Medicare Other | Admitting: Internal Medicine

## 2013-04-26 ENCOUNTER — Encounter: Payer: Self-pay | Admitting: Internal Medicine

## 2013-04-26 VITALS — BP 128/84 | HR 80 | Temp 98.3°F | Resp 16 | Wt 235.0 lb

## 2013-04-26 DIAGNOSIS — R079 Chest pain, unspecified: Secondary | ICD-10-CM | POA: Diagnosis not present

## 2013-04-26 DIAGNOSIS — I10 Essential (primary) hypertension: Secondary | ICD-10-CM

## 2013-04-26 MED ORDER — CEFUROXIME AXETIL 500 MG PO TABS: ORAL_TABLET | ORAL | Status: DC

## 2013-04-26 NOTE — Progress Notes (Signed)
Pre visit review using our clinic review tool, if applicable. No additional management support is needed unless otherwise documented below in the visit note. 

## 2013-04-26 NOTE — Assessment & Plan Note (Addendum)
EKG nl MSK Declined stress test

## 2013-04-26 NOTE — Assessment & Plan Note (Signed)
Continue with current prescription therapy as reflected on the Med list.  

## 2013-04-26 NOTE — Progress Notes (Signed)
   Subjective:    HPI  F/u cough - still coughing up mucus C/o L CP this am, having CP now  F/u L knee pain - resolved The patient presents for a follow-up of  chronic hypertension, chronic dyslipidemia.   BP Readings from Last 3 Encounters:  04/26/13 128/84  04/01/13 160/90  03/04/13 130/88     Wt Readings from Last 3 Encounters:  04/26/13 235 lb (106.595 kg)  04/01/13 230 lb (104.327 kg)  03/04/13 234 lb (106.142 kg)    Review of Systems  Constitutional: Negative for chills, activity change, appetite change, fatigue and unexpected weight change.  HENT: Negative for congestion, mouth sores and sinus pressure.   Eyes: Negative for visual disturbance.  Respiratory: Negative for cough and chest tightness.   Gastrointestinal: Negative for nausea and abdominal pain.  Genitourinary: Negative for frequency, difficulty urinating and vaginal pain.  Musculoskeletal: Positive for back pain (neck pain). Negative for gait problem.  Skin: Negative for pallor and rash.  Neurological: Negative for dizziness, tremors, weakness and headaches.  Psychiatric/Behavioral: Negative for confusion and sleep disturbance.       Objective:   Physical Exam  Constitutional: She appears well-developed and well-nourished. No distress.  Obese   HENT:  Head: Normocephalic.  Right Ear: External ear normal.  Left Ear: External ear normal.  Nose: Nose normal.  Mouth/Throat: Oropharynx is clear and moist.  Eyes: Conjunctivae are normal. Pupils are equal, round, and reactive to light. Right eye exhibits no discharge. Left eye exhibits no discharge.  Neck: Normal range of motion. Neck supple. No JVD present. No tracheal deviation present. No thyromegaly present.  Cardiovascular: Normal rate, regular rhythm and normal heart sounds.   Pulmonary/Chest: No stridor. No respiratory distress. She has no wheezes.  Abdominal: Soft. Bowel sounds are normal. She exhibits no distension and no mass. There is no  tenderness. There is no rebound and no guarding.  Musculoskeletal: She exhibits no edema and no tenderness (B neck and occip nerve areas).  Lymphadenopathy:    She has no cervical adenopathy.  Neurological: She displays normal reflexes. No cranial nerve deficit. She exhibits normal muscle tone. Coordination normal.  Skin: No rash noted. No erythema.  Psychiatric: She has a normal mood and affect. Her behavior is normal. Judgment and thought content normal.  L knee is not tender L ankle is swollen and a little tender Chest wall NT  Lab Results  Component Value Date   WBC 7.9 10/11/2012   HGB 12.2 10/11/2012   HCT 36.3 10/11/2012   PLT 192.0 10/11/2012   GLUCOSE 91 10/11/2012   CHOL 129 09/06/2012   TRIG 63.0 09/06/2012   HDL 33.90* 09/06/2012   LDLCALC 83 09/06/2012   ALT 12 09/06/2012   AST 14 09/06/2012   NA 139 10/11/2012   K 4.2 10/11/2012   CL 104 10/11/2012   CREATININE 0.9 10/11/2012   BUN 18 10/11/2012   CO2 26 10/11/2012   TSH 1.14 09/06/2012   HGBA1C 5.7 08/11/2011     EKG nl    Assessment & Plan:

## 2013-04-29 ENCOUNTER — Other Ambulatory Visit: Payer: Self-pay | Admitting: *Deleted

## 2013-04-29 MED ORDER — METOPROLOL SUCCINATE ER 50 MG PO TB24: 50.0000 mg | ORAL_TABLET | Freq: Two times a day (BID) | ORAL | Status: DC

## 2013-07-26 ENCOUNTER — Ambulatory Visit (INDEPENDENT_AMBULATORY_CARE_PROVIDER_SITE_OTHER): Payer: Medicare Other | Admitting: Internal Medicine

## 2013-07-26 ENCOUNTER — Encounter: Payer: Self-pay | Admitting: Internal Medicine

## 2013-07-26 VITALS — BP 150/84 | HR 77 | Temp 99.1°F | Wt 235.0 lb

## 2013-07-26 DIAGNOSIS — M25569 Pain in unspecified knee: Secondary | ICD-10-CM

## 2013-07-26 DIAGNOSIS — I10 Essential (primary) hypertension: Secondary | ICD-10-CM

## 2013-07-26 DIAGNOSIS — E669 Obesity, unspecified: Secondary | ICD-10-CM | POA: Diagnosis not present

## 2013-07-26 DIAGNOSIS — M25562 Pain in left knee: Secondary | ICD-10-CM

## 2013-07-26 DIAGNOSIS — M25561 Pain in right knee: Secondary | ICD-10-CM | POA: Insufficient documentation

## 2013-07-26 NOTE — Progress Notes (Signed)
Subjective:    HPI   F/u L knee pain -worse again The patient presents for a follow-up of  chronic hypertension, chronic dyslipidemia.   BP Readings from Last 3 Encounters:  07/26/13 150/84  04/26/13 128/84  04/01/13 160/90     Wt Readings from Last 3 Encounters:  07/26/13 235 lb (106.595 kg)  04/26/13 235 lb (106.595 kg)  04/01/13 230 lb (104.327 kg)    Review of Systems  Constitutional: Negative for chills, activity change, appetite change, fatigue and unexpected weight change.  HENT: Negative for congestion, mouth sores and sinus pressure.   Eyes: Negative for visual disturbance.  Respiratory: Negative for cough and chest tightness.   Gastrointestinal: Negative for nausea and abdominal pain.  Genitourinary: Negative for frequency, difficulty urinating and vaginal pain.  Musculoskeletal: Positive for back pain (neck pain). Negative for gait problem.  Skin: Negative for pallor and rash.  Neurological: Negative for dizziness, tremors, weakness and headaches.  Psychiatric/Behavioral: Negative for confusion and sleep disturbance.       Objective:   Physical Exam  Constitutional: She appears well-developed and well-nourished. No distress.  Obese   HENT:  Head: Normocephalic.  Right Ear: External ear normal.  Left Ear: External ear normal.  Nose: Nose normal.  Mouth/Throat: Oropharynx is clear and moist.  Eyes: Conjunctivae are normal. Pupils are equal, round, and reactive to light. Right eye exhibits no discharge. Left eye exhibits no discharge.  Neck: Normal range of motion. Neck supple. No JVD present. No tracheal deviation present. No thyromegaly present.  Cardiovascular: Normal rate, regular rhythm and normal heart sounds.   Pulmonary/Chest: No stridor. No respiratory distress. She has no wheezes.  Abdominal: Soft. Bowel sounds are normal. She exhibits no distension and no mass. There is no tenderness. There is no rebound and no guarding.  Musculoskeletal: She  exhibits tenderness (B knees are tender). She exhibits no edema.  Lymphadenopathy:    She has no cervical adenopathy.  Neurological: She displays normal reflexes. No cranial nerve deficit. She exhibits normal muscle tone. Coordination normal.  Skin: No rash noted. No erythema.  Psychiatric: She has a normal mood and affect. Her behavior is normal. Judgment and thought content normal.  L knee is not tender L ankle is swollen and a little tender   Lab Results  Component Value Date   WBC 7.9 10/11/2012   HGB 12.2 10/11/2012   HCT 36.3 10/11/2012   PLT 192.0 10/11/2012   GLUCOSE 91 10/11/2012   CHOL 129 09/06/2012   TRIG 63.0 09/06/2012   HDL 33.90* 09/06/2012   LDLCALC 83 09/06/2012   ALT 12 09/06/2012   AST 14 09/06/2012   NA 139 10/11/2012   K 4.2 10/11/2012   CL 104 10/11/2012   CREATININE 0.9 10/11/2012   BUN 18 10/11/2012   CO2 26 10/11/2012   TSH 1.14 09/06/2012   HGBA1C 5.7 08/11/2011    Procedure Note :     Procedure : Joint Injection,  R and L knees   Indication:  Joint osteoarthritis with refractory  chronic pain.   Risks including unsuccessful procedure , bleeding, infection, bruising, skin atrophy, "steroid flare-up" and others were explained to the patient in detail as well as the benefits. Informed consent was obtained and signed.   Tthe patient was placed in a comfortable position. Lateral approach was used. Skin was prepped with Betadine and alcohol  and anesthetized a cooling spray. Then, a 5 cc syringe with a 1.5 inch long 25-gauge needle was used for each  joint injection.. The needle was advanced  Into the knee joint cavity. I aspirated a small amount of intra-articular fluid to confirm correct placement of the needle and injected the joint with 5 mL of 2% lidocaine and 40 mg of Depo-Medrol each .  Band-Aid was applied B.   Tolerated well. Complications: None. Good pain relief following the procedure.   Postprocedure instructions :    A Band-Aid should be left on for 12  hours. Injection therapy is not a cure itself. It is used in conjunction with other modalities. You can use nonsteroidal anti-inflammatories like ibuprofen , hot and cold compresses. Rest is recommended in the next 24 hours. You need to report immediately  if fever, chills or any signs of infection develop.      Assessment & Plan:

## 2013-07-26 NOTE — Assessment & Plan Note (Signed)
Asked to inject - see procedure

## 2013-07-26 NOTE — Progress Notes (Signed)
Pre visit review using our clinic review tool, if applicable. No additional management support is needed unless otherwise documented below in the visit note. 

## 2013-07-26 NOTE — Patient Instructions (Signed)
Postprocedure instructions :    A Band-Aid should be left on for 12 hours. Injection therapy is not a cure itself. It is used in conjunction with other modalities. You can use nonsteroidal anti-inflammatories like ibuprofen , hot and cold compresses. Rest is recommended in the next 24 hours. You need to report immediately  if fever, chills or any signs of infection develop. 

## 2013-07-26 NOTE — Assessment & Plan Note (Signed)
Continue with current prescription therapy as reflected on the Med list.  

## 2013-07-26 NOTE — Assessment & Plan Note (Signed)
Wt Readings from Last 3 Encounters:  07/26/13 235 lb (106.595 kg)  04/26/13 235 lb (106.595 kg)  04/01/13 230 lb (104.327 kg)

## 2013-07-28 ENCOUNTER — Other Ambulatory Visit: Payer: Self-pay | Admitting: Internal Medicine

## 2013-09-28 ENCOUNTER — Ambulatory Visit (INDEPENDENT_AMBULATORY_CARE_PROVIDER_SITE_OTHER): Payer: Medicare Other

## 2013-09-28 VITALS — BP 144/79 | HR 76 | Resp 18

## 2013-09-28 DIAGNOSIS — M7750 Other enthesopathy of unspecified foot: Secondary | ICD-10-CM

## 2013-09-28 DIAGNOSIS — R52 Pain, unspecified: Secondary | ICD-10-CM

## 2013-09-28 DIAGNOSIS — R609 Edema, unspecified: Secondary | ICD-10-CM

## 2013-09-28 DIAGNOSIS — M1990 Unspecified osteoarthritis, unspecified site: Secondary | ICD-10-CM | POA: Diagnosis not present

## 2013-09-28 DIAGNOSIS — S93409A Sprain of unspecified ligament of unspecified ankle, initial encounter: Secondary | ICD-10-CM

## 2013-09-28 MED ORDER — PREDNISONE 10 MG PO KIT: PACK | ORAL | Status: DC

## 2013-09-28 NOTE — Progress Notes (Signed)
° °  Subjective:    Patient ID: Mindy Taylor, female    DOB: 1929-02-23, 80 y.o.   MRN: [id number]  HPI my left foot is swelling and no injury noted and burns and throbs and hurts with shoes and no numbness or tingling and has been going on for about several months    Review of Systems  All other systems reviewed and are negative.      Objective:   Physical Exam 80 year old [place] female process at this time well-developed well-nourished oriented x3 however somewhat unclear about her complaint patient indicates she is having swelling in her foot however on exam and evaluation she is planning to her ankle medial lateral malleoli are area of her left foot and ankle. Patient describes having swelling which is evident along the lateral malleolus and sinus tarsi area left ankle and pain and shooting sensation and burning at times.  Lower extremity objective findings as follows pedal pulses palpable DP +2/4 bilateral PT plus one over 4 bilateral +1 edema noted capillary refill time 3 seconds all digits neurologically epicritic and proprioceptive sensations appear to be intact and symmetric bilateral there is normal plantar response DTRs not elicited. Dermatologically skin color pigment normal hair growth absent nails somewhat criptotic and friable and yellow discolored. There is pain on palpation in the sinus tarsi and lateral ankle area the foot does not have any pain on palpation or range of motion of the digits her toes however plantarflexion of the ankle inversion the ankle is exquisitely painful tender and symptomatic there is some tenderness along the medial malleoli are area as well although not as severe after explaining the patient's her ankle not foot that is the source of her pain x-rays did demonstrate some arthrosis of the ankle mortise some asymmetric joint space narrowing posse small osteophyte medial malleoli are area and again lateral projection reveals asymmetric joint space  narrowing of the ankle mortise patient indicates that she injured her posse broken left foot sometime in the past and more recently may have turned her foot while walking on some uneven surface such as grass and gravel she has had some problems twisting her feet in the past. There is no open wounds no ulcerations at this time x-rays demonstrate no acute fracture in the metatarsals or forefoot or midfoot projection reveals promontory changes anterior break in the cyma line posterior facet is intact however the ankle mortise does show asymmetric joint space narrowing and osteophyte of the medial malleolus. An extreme tenderness in the sinus tarsi on palpation the anterior talofibular ligament on palpation is exquisitely tender as well to       Assessment & Plan:  Assessment capsulitis lateral ankle and subtalar joint with sinus tarsitis and osteoarthropathy of the ankle itself with asymmetric joint space narrowing. Plan at this time patient is placed on a regimen of a Sterapred Deas Dosepak x6 days also at this time immobilized in the air fracture walker cam boot and she's had difficult time walking or weightbearing at all this time. The boot will help control the edema keep foot elevated and a moderate walking activities and we'll take the Sterapred Dosepak x6 days recheck in 2 weeks for followup and reevaluation may maintain the boot longer if needed consider other alternative treatments discussed the possibility of a steroid cortisone injection if no significant improvement is noted or other noninvasive studies such as MRI may be beneficial. Again recheck in 2-3 weeks for followup  Harriet Masson DPM

## 2013-09-28 NOTE — Patient Instructions (Signed)

## 2013-10-18 ENCOUNTER — Ambulatory Visit: Payer: Medicare Other

## 2013-11-01 ENCOUNTER — Ambulatory Visit (INDEPENDENT_AMBULATORY_CARE_PROVIDER_SITE_OTHER): Payer: Medicare Other | Admitting: Internal Medicine

## 2013-11-01 ENCOUNTER — Encounter: Payer: Self-pay | Admitting: Internal Medicine

## 2013-11-01 ENCOUNTER — Ambulatory Visit (INDEPENDENT_AMBULATORY_CARE_PROVIDER_SITE_OTHER): Payer: Medicare Other

## 2013-11-01 VITALS — BP 120/74 | HR 76 | Temp 98.5°F | Resp 16 | Wt 234.0 lb

## 2013-11-01 VITALS — BP 139/70 | HR 86 | Resp 16 | Ht 67.0 in | Wt 220.0 lb

## 2013-11-01 DIAGNOSIS — M25473 Effusion, unspecified ankle: Secondary | ICD-10-CM

## 2013-11-01 DIAGNOSIS — S93409A Sprain of unspecified ligament of unspecified ankle, initial encounter: Secondary | ICD-10-CM

## 2013-11-01 DIAGNOSIS — M25472 Effusion, left ankle: Secondary | ICD-10-CM

## 2013-11-01 DIAGNOSIS — M1990 Unspecified osteoarthritis, unspecified site: Secondary | ICD-10-CM

## 2013-11-01 DIAGNOSIS — R52 Pain, unspecified: Secondary | ICD-10-CM | POA: Diagnosis not present

## 2013-11-01 DIAGNOSIS — E669 Obesity, unspecified: Secondary | ICD-10-CM

## 2013-11-01 DIAGNOSIS — R5383 Other fatigue: Secondary | ICD-10-CM | POA: Diagnosis not present

## 2013-11-01 DIAGNOSIS — M76829 Posterior tibial tendinitis, unspecified leg: Secondary | ICD-10-CM

## 2013-11-01 DIAGNOSIS — M76822 Posterior tibial tendinitis, left leg: Secondary | ICD-10-CM

## 2013-11-01 DIAGNOSIS — R5381 Other malaise: Secondary | ICD-10-CM | POA: Diagnosis not present

## 2013-11-01 DIAGNOSIS — M25476 Effusion, unspecified foot: Secondary | ICD-10-CM

## 2013-11-01 DIAGNOSIS — R062 Wheezing: Secondary | ICD-10-CM

## 2013-11-01 DIAGNOSIS — F411 Generalized anxiety disorder: Secondary | ICD-10-CM | POA: Diagnosis not present

## 2013-11-01 MED ORDER — BENZONATATE 100 MG PO CAPS: 100.0000 mg | ORAL_CAPSULE | Freq: Two times a day (BID) | ORAL | Status: DC | PRN

## 2013-11-01 MED ORDER — AMOXICILLIN 875 MG PO TABS
875.0000 mg | ORAL_TABLET | Freq: Two times a day (BID) | ORAL | Status: DC
Start: 2013-11-01 — End: 2014-02-01

## 2013-11-01 NOTE — Assessment & Plan Note (Signed)
Continue with current prescription therapy as reflected on the Med list.  

## 2013-11-01 NOTE — Assessment & Plan Note (Addendum)
Amoxicillin x 10 d Tessalon prn Repeat CXR if not better

## 2013-11-01 NOTE — Assessment & Plan Note (Signed)
Wt Readings from Last 3 Encounters:  11/01/13 234 lb (106.142 kg)  11/01/13 220 lb (99.791 kg)  07/26/13 235 lb (106.595 kg)

## 2013-11-01 NOTE — Progress Notes (Signed)
   Subjective:    Patient ID: Mindy Taylor, female    DOB: Oct 04, 1928, 80 y.o.   MRN: [id number]  HPI Comments: Pt states she is some better,, but still has some swelling, since she changed to a rubber soled shoe.  She states she could not wear the Air Fracture Walker due to the heaviness of the boot, bothering her knees.  Foot Pain      Review of Systems no new findings or systemic changes noted    Objective:   Physical Exam   patient and these have pain particular distribution the posterior tibial tendon medial left ankle in the sinus tarsi area left ankle area patient did not wear the boot much couldn't tolerate it is bothering her knee or she tried walking without the without use a cane her a walker for assistance patient however this time continues to have pain on palpation she did take that dose pack to help temporarily although improved is not completely resolved tenderness of the posterior tibial tendon still identified and sinus tarsitis is noted patient is encouraged to wear the boot for 2-4 weeks especially when he walking 2 days may remove it for sleeping or she is not active or ambulating. However when wearing the boot shoe also use a walker to help offload the area and keep pressure off her knee due to arthropathy of her knee. Reevaluate in one month for recheck       Assessment & Plan:  Assessment capsulitis and ankle sprain left ankle with persistent posterior tibial tendinitis as well sinus tarsitis and capsulitis patient did not wear the ankle or the air fracture boot as instructed however this time we discussed with her that she can use a walker along with it to help mobilize the ankle without immobilizing is not likely to improve patient been doing too many activities otherwise without mobilization recheck in one month for further followup maintain air fracture walker as instructed with the use of a walker to assist in gait  Harriet Masson DPM

## 2013-11-01 NOTE — Progress Notes (Signed)
Pre visit review using our clinic review tool, if applicable. No additional management support is needed unless otherwise documented below in the visit note. 

## 2013-11-01 NOTE — Assessment & Plan Note (Signed)
Discussed - cut back on activities

## 2013-11-01 NOTE — Patient Instructions (Signed)
ICE INSTRUCTIONS  Apply ice or cold pack to the affected area at least 3 times a day for 10-15 minutes each time.  You should also use ice after prolonged activity or vigorous exercise.  Do not apply ice longer than 20 minutes at one time.  Always keep a cloth between your skin and the ice pack to prevent burns.  Being consistent and following these instructions will help control your symptoms.  We suggest you purchase a gel ice pack because they are reusable and do bit leak.  Some of them are designed to wrap around the area.  Use the method that works best for you.  Here are some other suggestions for icing.   Use a frozen bag of peas or corn-inexpensive and molds well to your body, usually stays frozen for 10 to 20 minutes.  Wet a towel with cold water and squeeze out the excess until it's damp.  Place in a bag in the freezer for 20 minutes. Then remove and use.  Need to use the air fracture boot whenever possible member this is a removal cast however because of your knee history recommend that you use a walker to help stabilize her keep pressure off your knee while wearing the boot for up to 2-4 weeks

## 2013-11-01 NOTE — Assessment & Plan Note (Signed)
Needs to use a boot

## 2013-11-22 ENCOUNTER — Telehealth: Payer: Self-pay | Admitting: *Deleted

## 2013-11-22 MED ORDER — METOPROLOL SUCCINATE ER 50 MG PO TB24: 50.0000 mg | ORAL_TABLET | Freq: Two times a day (BID) | ORAL | Status: DC

## 2013-11-22 NOTE — Telephone Encounter (Signed)
Pt states she received letter from mail service and they have her metoprolol in stock requesting prescription to be sent to them. [facility].Marland KitchenJohny Chess

## 2013-11-30 ENCOUNTER — Ambulatory Visit (INDEPENDENT_AMBULATORY_CARE_PROVIDER_SITE_OTHER): Payer: Medicare Other

## 2013-11-30 VITALS — BP 136/82 | HR 80 | Resp 16

## 2013-11-30 DIAGNOSIS — M7750 Other enthesopathy of unspecified foot: Secondary | ICD-10-CM

## 2013-11-30 DIAGNOSIS — M76829 Posterior tibial tendinitis, unspecified leg: Secondary | ICD-10-CM

## 2013-11-30 DIAGNOSIS — M19079 Primary osteoarthritis, unspecified ankle and foot: Secondary | ICD-10-CM

## 2013-11-30 DIAGNOSIS — M76822 Posterior tibial tendinitis, left leg: Secondary | ICD-10-CM

## 2013-11-30 DIAGNOSIS — S93409A Sprain of unspecified ligament of unspecified ankle, initial encounter: Secondary | ICD-10-CM

## 2013-11-30 DIAGNOSIS — R52 Pain, unspecified: Secondary | ICD-10-CM

## 2013-11-30 DIAGNOSIS — M7752 Other enthesopathy of left foot: Secondary | ICD-10-CM

## 2013-11-30 DIAGNOSIS — M19072 Primary osteoarthritis, left ankle and foot: Secondary | ICD-10-CM

## 2013-11-30 NOTE — Patient Instructions (Signed)
ICE INSTRUCTIONS  Apply ice or cold pack to the affected area at least 3 times a day for 10-15 minutes each time.  You should also use ice after prolonged activity or vigorous exercise.  Do not apply ice longer than 20 minutes at one time.  Always keep a cloth between your skin and the ice pack to prevent burns.  Being consistent and following these instructions will help control your symptoms.  We suggest you purchase a gel ice pack because they are reusable and do bit leak.  Some of them are designed to wrap around the area.  Use the method that works best for you.  Here are some other suggestions for icing.   Use a frozen bag of peas or corn-inexpensive and molds well to your body, usually stays frozen for 10 to 20 minutes.  Wet a towel with cold water and squeeze out the excess until it's damp.  Place in a bag in the freezer for 20 minutes. Then remove and use.  Maintain ankle stabilizer for the next 3 or 4 weeks to help allow the ankle to heal. Also use a stabilizer in the future anytime or walking on uneven ground grapple or grass etc.

## 2013-11-30 NOTE — Progress Notes (Signed)
   Subjective:    Patient ID: Mindy Taylor, female    DOB: April 28, 1929, 80 y.o.   MRN: [id number]  HPI  F/u left foot pain, still hurts intermittently, but has noticed improvement since last appt. Mild swelling noted. Pt states she elevates and ices when pain and swelling occurs  Review of Systems no new findings or systemic changes noted     Objective:   Physical Exam Patient presents at this time for followup of chronic ankle brain residual posterior tibial tendinitis of the medial left ankle. Patient has significant improvement swelling is going down there is no pain on palpation or range of motion either in the ankle subtalar joint sinus tarsi nor at the talonavicular insertion of the posterior tibial tendon. Has been wearing the air fracture boot for complete immobilization and using a walker for ambulation. At this time is ready to step down onto the boot and back into the walking or tissue however for stability patient replaced and ankle stabilizer to maintain some stability for additional one month if needed. Neurovascular status is intact pedal pulses are palpable epicritic and proprioceptive sensations intact no ecchymosis noted muscle strengths intact bilateral patient has significant improvement in the last 4 weeks with the use of the air fracture boot       Assessment & Plan:  Assessment resolving are improving posterior tibial tendinitis and medial ankle sprain left ankle. We'll discontinue boot and initiate use of the ankle stabilizer which is less heavy obtrusive patient is having some knee difficulties due to the arthritis in her knees and ankles and the boot is causing her some discomfort in her knees have been back. Ankle stabilizer is dispensed with instructions in application suggest followup in one to 3 months if there is any persistent problems failure to improve or re\re exacerbation of symptoms  Harriet Masson DPM

## 2013-12-11 ENCOUNTER — Other Ambulatory Visit: Payer: Self-pay | Admitting: Internal Medicine

## 2014-02-01 ENCOUNTER — Encounter: Payer: Self-pay | Admitting: Internal Medicine

## 2014-02-01 ENCOUNTER — Ambulatory Visit (INDEPENDENT_AMBULATORY_CARE_PROVIDER_SITE_OTHER): Payer: Medicare Other | Admitting: Internal Medicine

## 2014-02-01 VITALS — BP 144/88 | HR 76 | Temp 98.9°F | Resp 16 | Wt 233.0 lb

## 2014-02-01 DIAGNOSIS — I10 Essential (primary) hypertension: Secondary | ICD-10-CM | POA: Diagnosis not present

## 2014-02-01 DIAGNOSIS — F411 Generalized anxiety disorder: Secondary | ICD-10-CM | POA: Diagnosis not present

## 2014-02-01 DIAGNOSIS — Z23 Encounter for immunization: Secondary | ICD-10-CM | POA: Diagnosis not present

## 2014-02-01 DIAGNOSIS — M25569 Pain in unspecified knee: Secondary | ICD-10-CM | POA: Diagnosis not present

## 2014-02-01 DIAGNOSIS — E669 Obesity, unspecified: Secondary | ICD-10-CM

## 2014-02-01 DIAGNOSIS — M25561 Pain in right knee: Secondary | ICD-10-CM

## 2014-02-01 DIAGNOSIS — M25562 Pain in left knee: Secondary | ICD-10-CM

## 2014-02-01 MED ORDER — TRAMADOL HCL 50 MG PO TABS: 50.0000 mg | ORAL_TABLET | Freq: Two times a day (BID) | ORAL | Status: DC | PRN

## 2014-02-01 MED ORDER — PRAVASTATIN SODIUM 40 MG PO TABS: 40.0000 mg | ORAL_TABLET | Freq: Every day | ORAL | Status: DC

## 2014-02-01 MED ORDER — METHYLPREDNISOLONE ACETATE 40 MG/ML IJ SUSP
40.0000 mg | Freq: Once | INTRAMUSCULAR | Status: AC
  Administered 2014-02-01: 40 mg via INTRA_ARTICULAR

## 2014-02-01 MED ORDER — METHYLPREDNISOLONE ACETATE 80 MG/ML IJ SUSP: 80.0000 mg | Freq: Once | INTRAMUSCULAR | Status: DC

## 2014-02-01 NOTE — Assessment & Plan Note (Signed)
Wt Readings from Last 3 Encounters:  02/01/14 233 lb (105.688 kg)  11/01/13 234 lb (106.142 kg)  11/01/13 220 lb (99.791 kg)

## 2014-02-01 NOTE — Progress Notes (Signed)
Subjective:    HPI   F/u L knee pain - worse again The patient presents for a follow-up of  chronic hypertension, chronic dyslipidemia.   BP Readings from Last 3 Encounters:  02/01/14 144/88  11/30/13 136/82  11/01/13 120/74     Wt Readings from Last 3 Encounters:  02/01/14 233 lb (105.688 kg)  11/01/13 234 lb (106.142 kg)  11/01/13 220 lb (99.791 kg)    Review of Systems  Constitutional: Negative for chills, activity change, appetite change, fatigue and unexpected weight change.  HENT: Negative for congestion, mouth sores and sinus pressure.   Eyes: Negative for visual disturbance.  Respiratory: Negative for cough and chest tightness.   Gastrointestinal: Negative for nausea and abdominal pain.  Genitourinary: Negative for frequency, difficulty urinating and vaginal pain.  Musculoskeletal: Positive for back pain (neck pain). Negative for gait problem.  Skin: Negative for pallor and rash.  Neurological: Negative for dizziness, tremors, weakness and headaches.  Psychiatric/Behavioral: Negative for confusion and sleep disturbance.       Objective:   Physical Exam  Constitutional: She appears well-developed and well-nourished. No distress.  Obese   HENT:  Head: Normocephalic.  Right Ear: External ear normal.  Left Ear: External ear normal.  Nose: Nose normal.  Mouth/Throat: Oropharynx is clear and moist.  Eyes: Conjunctivae are normal. Pupils are equal, round, and reactive to light. Right eye exhibits no discharge. Left eye exhibits no discharge.  Neck: Normal range of motion. Neck supple. No JVD present. No tracheal deviation present. No thyromegaly present.  Cardiovascular: Normal rate, regular rhythm and normal heart sounds.   Pulmonary/Chest: No stridor. No respiratory distress. She has no wheezes.  Abdominal: Soft. Bowel sounds are normal. She exhibits no distension and no mass. There is no tenderness. There is no rebound and no guarding.  Musculoskeletal: She  exhibits tenderness (B knees are tender). She exhibits no edema.  Lymphadenopathy:    She has no cervical adenopathy.  Neurological: She displays normal reflexes. No cranial nerve deficit. She exhibits normal muscle tone. Coordination normal.  Skin: No rash noted. No erythema.  Psychiatric: She has a normal mood and affect. Her behavior is normal. Judgment and thought content normal.  L knee is not tender L ankle is less swollen and a little tender   Lab Results  Component Value Date   WBC 7.9 10/11/2012   HGB 12.2 10/11/2012   HCT 36.3 10/11/2012   PLT 192.0 10/11/2012   GLUCOSE 91 10/11/2012   CHOL 129 09/06/2012   TRIG 63.0 09/06/2012   HDL 33.90* 09/06/2012   LDLCALC 83 09/06/2012   ALT 12 09/06/2012   AST 14 09/06/2012   NA 139 10/11/2012   K 4.2 10/11/2012   CL 104 10/11/2012   CREATININE 0.9 10/11/2012   BUN 18 10/11/2012   CO2 26 10/11/2012   TSH 1.14 09/06/2012   HGBA1C 5.7 08/11/2011   Procedure Note :  Procedure : Joint Injection, R and L knees  Indication: Joint osteoarthritis with refractory chronic pain.  Risks including unsuccessful procedure , bleeding, infection, bruising, skin atrophy, "steroid flare-up" and others were explained to the patient in detail as well as the benefits. Informed consent was obtained and signed.  Tthe patient was placed in a comfortable position. Lateral approach was used. Skin was prepped with Betadine and alcohol and anesthetized a cooling spray. Then, a 5 cc syringe with a 1.5 inch long 25-gauge needle was used for each joint injection.. The needle was advanced Into the knee  joint cavity. I aspirated a small amount of intra-articular fluid to confirm correct placement of the needle and injected the joint with 5 mL of 2% lidocaine and 40 mg of Depo-Medrol each . Band-Aid was applied B.  Tolerated well. Complications: None. Good pain relief following the procedure.         Assessment & Plan:

## 2014-02-01 NOTE — Assessment & Plan Note (Signed)
Continue with current prescription therapy as reflected on the Med list.  

## 2014-02-01 NOTE — Progress Notes (Signed)
Pre visit review using our clinic review tool, if applicable. No additional management support is needed unless otherwise documented below in the visit note. 

## 2014-02-01 NOTE — Patient Instructions (Signed)
Postprocedure instructions :    A Band-Aid should be left on for 12 hours. Injection therapy is not a cure itself. It is used in conjunction with other modalities. You can use nonsteroidal anti-inflammatories like ibuprofen , hot and cold compresses. Rest is recommended in the next 24 hours. You need to report immediately  if fever, chills or any signs of infection develop. 

## 2014-02-01 NOTE — Assessment & Plan Note (Signed)
Pt asked for an injection - see procedure

## 2014-02-01 NOTE — Assessment & Plan Note (Signed)
Continue with current prn prescription therapy as reflected on the Med list.  

## 2014-02-03 ENCOUNTER — Telehealth: Payer: Self-pay | Admitting: Internal Medicine

## 2014-02-03 NOTE — Telephone Encounter (Signed)
Received form from [facility], given to MD for review/signature. Will fax when MD completes.

## 2014-02-03 NOTE — Telephone Encounter (Signed)
[facility] stating [facility] needed  "something" from [place] to process script for pravastatin .  I asked if it was a PA.  She did not know.  Did notify patient that [facility] would have to send one over to be processed.

## 2014-03-10 ENCOUNTER — Encounter (HOSPITAL_COMMUNITY): Payer: Self-pay | Admitting: Emergency Medicine

## 2014-03-10 ENCOUNTER — Inpatient Hospital Stay (HOSPITAL_COMMUNITY)
Admission: EM | Admit: 2014-03-10 | Discharge: 2014-03-13 | DRG: 684 | Disposition: A | Discharge status: Home / Self Care | Payer: Medicare Other | Attending: Internal Medicine | Admitting: Internal Medicine

## 2014-03-10 ENCOUNTER — Emergency Department (HOSPITAL_COMMUNITY): Payer: Medicare Other

## 2014-03-10 DIAGNOSIS — E669 Obesity, unspecified: Secondary | ICD-10-CM | POA: Diagnosis present

## 2014-03-10 DIAGNOSIS — Z8249 Family history of ischemic heart disease and other diseases of the circulatory system: Secondary | ICD-10-CM | POA: Diagnosis not present

## 2014-03-10 DIAGNOSIS — Z87891 Personal history of nicotine dependence: Secondary | ICD-10-CM

## 2014-03-10 DIAGNOSIS — I10 Essential (primary) hypertension: Secondary | ICD-10-CM | POA: Diagnosis present

## 2014-03-10 DIAGNOSIS — M25472 Effusion, left ankle: Secondary | ICD-10-CM | POA: Diagnosis present

## 2014-03-10 DIAGNOSIS — Z7982 Long term (current) use of aspirin: Secondary | ICD-10-CM | POA: Diagnosis not present

## 2014-03-10 DIAGNOSIS — Z91030 Bee allergy status: Secondary | ICD-10-CM

## 2014-03-10 DIAGNOSIS — Z6836 Body mass index (BMI) 36.0-36.9, adult: Secondary | ICD-10-CM

## 2014-03-10 DIAGNOSIS — R0789 Other chest pain: Secondary | ICD-10-CM | POA: Diagnosis not present

## 2014-03-10 DIAGNOSIS — I059 Rheumatic mitral valve disease, unspecified: Secondary | ICD-10-CM | POA: Diagnosis not present

## 2014-03-10 DIAGNOSIS — E785 Hyperlipidemia, unspecified: Secondary | ICD-10-CM | POA: Diagnosis present

## 2014-03-10 DIAGNOSIS — J986 Disorders of diaphragm: Secondary | ICD-10-CM | POA: Diagnosis not present

## 2014-03-10 DIAGNOSIS — R079 Chest pain, unspecified: Secondary | ICD-10-CM | POA: Diagnosis not present

## 2014-03-10 DIAGNOSIS — N189 Chronic kidney disease, unspecified: Secondary | ICD-10-CM | POA: Diagnosis not present

## 2014-03-10 DIAGNOSIS — Z8673 Personal history of transient ischemic attack (TIA), and cerebral infarction without residual deficits: Secondary | ICD-10-CM

## 2014-03-10 DIAGNOSIS — Z881 Allergy status to other antibiotic agents status: Secondary | ICD-10-CM

## 2014-03-10 DIAGNOSIS — I129 Hypertensive chronic kidney disease with stage 1 through stage 4 chronic kidney disease, or unspecified chronic kidney disease: Secondary | ICD-10-CM | POA: Diagnosis not present

## 2014-03-10 DIAGNOSIS — M1990 Unspecified osteoarthritis, unspecified site: Secondary | ICD-10-CM | POA: Diagnosis present

## 2014-03-10 DIAGNOSIS — Z789 Other specified health status: Secondary | ICD-10-CM | POA: Diagnosis present

## 2014-03-10 DIAGNOSIS — J984 Other disorders of lung: Secondary | ICD-10-CM | POA: Diagnosis not present

## 2014-03-10 DIAGNOSIS — Z91038 Other insect allergy status: Secondary | ICD-10-CM

## 2014-03-10 DIAGNOSIS — R072 Precordial pain: Secondary | ICD-10-CM | POA: Diagnosis not present

## 2014-03-10 DIAGNOSIS — I2699 Other pulmonary embolism without acute cor pulmonale: Secondary | ICD-10-CM

## 2014-03-10 DIAGNOSIS — R600 Localized edema: Secondary | ICD-10-CM | POA: Diagnosis not present

## 2014-03-10 LAB — CBC
HEMATOCRIT: 36.1 % (ref 36.0–46.0)
Hemoglobin: 12.1 g/dL (ref 12.0–15.0)
MCH: 30 pg (ref 26.0–34.0)
MCHC: 33.5 g/dL (ref 30.0–36.0)
MCV: 89.6 fL (ref 78.0–100.0)
Platelets: 200 10*3/uL (ref 150–400)
RBC: 4.03 MIL/uL (ref 3.87–5.11)
RDW: 14 % (ref 11.5–15.5)
WBC: 6 10*3/uL (ref 4.0–10.5)

## 2014-03-10 LAB — BASIC METABOLIC PANEL
Anion gap: 10 (ref 5–15)
BUN: 19 mg/dL (ref 6–23)
CALCIUM: 9.3 mg/dL (ref 8.4–10.5)
CO2: 26 mEq/L (ref 19–32)
Chloride: 105 mEq/L (ref 96–112)
Creatinine, Ser: 0.86 mg/dL (ref 0.50–1.10)
GFR, EST AFRICAN AMERICAN: 70 mL/min — AB (ref >90)
GFR, EST NON AFRICAN AMERICAN: 61 mL/min — AB (ref >90)
Glucose, Bld: 97 mg/dL (ref 70–99)
POTASSIUM: 4.3 meq/L (ref 3.7–5.3)
Sodium: 141 mEq/L (ref 137–147)

## 2014-03-10 LAB — I-STAT TROPONIN, ED: Troponin i, poc: 0 ng/mL (ref 0.00–0.08)

## 2014-03-10 MED ORDER — NITROGLYCERIN 2 % TD OINT
0.5000 [in_us] | TOPICAL_OINTMENT | Freq: Once | TRANSDERMAL | Status: AC
  Administered 2014-03-10: 0.5 [in_us] via TOPICAL
  Filled 2014-03-10: qty 1

## 2014-03-10 MED ORDER — ASPIRIN 325 MG PO TABS
325.0000 mg | ORAL_TABLET | Freq: Once | ORAL | Status: AC
  Administered 2014-03-10: 325 mg via ORAL
  Filled 2014-03-10: qty 1

## 2014-03-10 NOTE — ED Provider Notes (Signed)
CSN: [id number]     Arrival date & time 03/10/14  1952 History   First MD Initiated Contact with Patient 03/10/14 2231     Chief Complaint  Patient presents with  . Chest Pain     (Consider location/radiation/quality/duration/timing/severity/associated sxs/prior Treatment) Patient is a 80 y.o. female presenting with chest pain. The history is provided by the patient. No language interpreter was used.  Chest Pain Pain location:  Substernal area Pain quality: aching and dull   Pain radiates to:  L arm and neck Pain radiates to the back: no   Pain severity:  Moderate Onset quality:  Sudden Timing:  Intermittent Progression:  Waxing and waning Chronicity:  New Context: at rest   Relieved by:  Nothing Worsened by:  Nothing tried Ineffective treatments:  None tried Associated symptoms: no abdominal pain, no altered mental status, no anxiety, no back pain, no cough, no diaphoresis, no dizziness, no fatigue, no fever, no headache, no nausea, no numbness, no palpitations, no shortness of breath, not vomiting and no weakness   Associated symptoms comment:  Lightheadedness, fatigue Risk factors: high cholesterol and hypertension   Risk factors: no birth control, no diabetes mellitus, no immobilization, not female, no prior DVT/PE and no smoking (quit in 2010 and)   Risk factors comment:  Positive family history for coronary disease but not premature CAD   Past Medical History  Diagnosis Date  . ANXIETY 03/05/2007  . ATAXIA 12/26/2009  . BEE STING 03/05/2007  . BRONCHITIS, ACUTE 06/28/2010  . CANDIDIASIS, VAGINAL 09/08/2008  . CEREBROVASCULAR ACCIDENT, HX OF 01/04/2010  . CERUMEN IMPACTION 06/05/2008  . DIZZINESS 12/26/2009  . Epistaxis 06/05/2008  . Headache(784.0) 06/05/2008  . HYPERLIPIDEMIA 12/08/2006  . HYPERTENSION 12/08/2006  . 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 NECK PAIN 06/05/2008  . OBESITY 09/07/2009  . ONYCHOMYCOSIS 03/09/2009  . OSTEOARTHRITIS 12/08/2006  . PARESTHESIA 12/26/2009  .  TOBACCO USE, QUIT 03/09/2009   Past Surgical History  Procedure Laterality Date  . Breast surgery    . Abdominal hysterectomy     Family History  Problem Relation Age of Onset  . Hypertension Other   . Hypertension Mother   . Hypertension Father    History  Substance Use Topics  . Smoking status: Former Research scientist (life sciences)  . Smokeless tobacco: Not on file  . Alcohol Use: No   OB History   Grav Para Term Preterm Abortions TAB SAB Ect Mult Living                 Review of Systems  Constitutional: Negative for fever, chills, diaphoresis, activity change, appetite change and fatigue.  HENT: Negative for congestion, facial swelling, rhinorrhea and sore throat.   Eyes: Negative for photophobia and discharge.  Respiratory: Negative for cough, chest tightness and shortness of breath.   Cardiovascular: Positive for chest pain. Negative for palpitations and leg swelling.  Gastrointestinal: Negative for nausea, vomiting, abdominal pain and diarrhea.  Endocrine: Negative for polydipsia and polyuria.  Genitourinary: Negative for dysuria, frequency, difficulty urinating and pelvic pain.  Musculoskeletal: Negative for arthralgias, back pain, neck pain and neck stiffness.  Skin: Negative for color change and wound.  Allergic/Immunologic: Negative for immunocompromised state.  Neurological: Negative for dizziness, facial asymmetry, weakness, numbness and headaches.  Hematological: Does not bruise/bleed easily.  Psychiatric/Behavioral: Negative for confusion and agitation.      Allergies  Bee venom; Ceftin; and Spider antivenin  Home Medications   Prior to Admission medications   Medication Sig Start Date End Date Taking? Authorizing  Provider  amLODipine (NORVASC) 5 MG tablet Take 5 mg by mouth every evening.   Yes Historical Provider, MD  aspirin 81 MG tablet Take 81 mg by mouth every evening.    Yes Historical Provider, MD  Cholecalciferol (VITAMIN D) 1000 UNITS capsule Take 1 capsule (1,000  Units total) by mouth daily. 01/03/11  Yes Aleksei Plotnikov V, MD  fish oil-omega-3 fatty acids 1000 MG capsule Take 1 g by mouth daily.     Yes Historical Provider, MD  losartan (COZAAR) 100 MG tablet Take 100 mg by mouth every evening.   Yes Historical Provider, MD  metoprolol succinate (TOPROL-XL) 50 MG 24 hr tablet Take 1 tablet (50 mg total) by mouth 2 (two) times daily. 11/22/13  Yes Aleksei Plotnikov V, MD  Multiple Vitamin (MULTIVITAMIN) tablet Take 1 tablet by mouth daily.     Yes Historical Provider, MD  pravastatin (PRAVACHOL) 40 MG tablet Take 1 tablet (40 mg total) by mouth daily. 02/01/14  Yes Aleksei Plotnikov V, MD  traMADol (ULTRAM) 50 MG tablet Take 50-100 mg by mouth every 6 (six) hours as needed for moderate pain.   Yes Historical Provider, MD  vitamin C (ASCORBIC ACID) 500 MG tablet Take 500 mg by mouth daily.     Yes Historical Provider, MD  vitamin E 400 UNIT capsule Take 400 Units by mouth daily.     Yes Historical Provider, MD   BP 136/114  Pulse 68  Temp(Src) 98.3 F (36.8 C)  Resp 15  SpO2 98% Physical Exam  Constitutional: She is oriented to person, place, and time. She appears well-developed and well-nourished. No distress.  HENT:  Head: Normocephalic and atraumatic.  Mouth/Throat: No oropharyngeal exudate.  Eyes: Pupils are equal, round, and reactive to light.  Neck: Normal range of motion. Neck supple.  Cardiovascular: Normal rate, regular rhythm and normal heart sounds.  Exam reveals no gallop and no friction rub.   No murmur heard. Pulmonary/Chest: Effort normal and breath sounds normal. No respiratory distress. She has no wheezes. She has no rales.  Abdominal: Soft. Bowel sounds are normal. She exhibits no distension and no mass. There is no tenderness. There is no rebound and no guarding.  Musculoskeletal: Normal range of motion. She exhibits no edema and no tenderness.  Neurological: She is alert and oriented to person, place, and time.  Skin: Skin is  warm and dry.  Psychiatric: She has a normal mood and affect.    ED Course  Procedures (including critical care time) Labs Review Labs Reviewed  BASIC METABOLIC PANEL - Abnormal; Notable for the following:    GFR calc non Af Amer 61 (*)    GFR calc Af Amer 70 (*)    All other components within normal limits  CBC  TROPONIN I  TROPONIN I  TROPONIN I  I-STAT TROPOININ, ED    Imaging Review Dg Chest 2 View  03/10/2014   CLINICAL DATA:  Initial evaluation chest pain  EXAM: CHEST  2 VIEW  COMPARISON:  04/01/2013  FINDINGS: Heart size and vascular pattern normal. Aortic ectasia. Lungs clear. No effusions.  IMPRESSION: No active cardiopulmonary disease.   Electronically Signed   By: Skipper Cliche M.D.   On: 03/10/2014 21:31     EKG Interpretation   Date/Time:  Friday March 10 2014 19:57:16 EDT Ventricular Rate:  86 PR Interval:  150 QRS Duration: 74 QT Interval:  360 QTC Calculation: 430 R Axis:   49 Text Interpretation:  Normal sinus rhythm Normal ECG No  prior for  comparison Confirmed by [facility]  MD, [place] [phone]) on 03/10/2014 10:38:20  PM      MDM   Final diagnoses:  Chest pain, unspecified chest pain type    Pt is a 80 y.o. female with Pmhx as above who presents with 4 days of intermittent mid sternal chest pain described as dull/aching pain with radiation down left arm and left neck. She has also had associated lightheadedness and fatigue. Chest pain occurring at rest and lasting minutes at a time. No exacerbating or alleviating symptoms. She's had no associated shortness of breath, nausea, diaphoresis, leg pain or swelling. Last episode chest pain lasted for 5-10 minutes and was around 6 PM (approximately 5 hours ago). She has no chest pain currently. On physical exam vital signs are stable and she is in no acute distress. Cardiopulmonary exam is benign. EKG is no acute ischemic findings. First troponin is negative. Chest x-ray is normal. She is at moderate risk of  MACE, using the HEART score. I believe she should be admitted for ACS rule out. [facility] will be consulted for admission. 325 aspirin and nitroglycerin paste will be given.        Ernestina Patches, MD 03/11/14 [phone]

## 2014-03-10 NOTE — ED Notes (Signed)
NO BLOOD TRANSFUSIONS.

## 2014-03-10 NOTE — ED Notes (Signed)
Pt. Reports chest tightness starting 2-3 days ago, pain is intermittent, radiates down left arm and left neck. No hx of heart problems. Denies SOB, N/V. Reports slight lightheadedness.

## 2014-03-11 ENCOUNTER — Encounter (HOSPITAL_COMMUNITY): Payer: Self-pay | Admitting: Internal Medicine

## 2014-03-11 DIAGNOSIS — M25472 Effusion, left ankle: Secondary | ICD-10-CM

## 2014-03-11 DIAGNOSIS — E785 Hyperlipidemia, unspecified: Secondary | ICD-10-CM

## 2014-03-11 DIAGNOSIS — R600 Localized edema: Secondary | ICD-10-CM

## 2014-03-11 DIAGNOSIS — Z789 Other specified health status: Secondary | ICD-10-CM | POA: Diagnosis present

## 2014-03-11 DIAGNOSIS — I10 Essential (primary) hypertension: Secondary | ICD-10-CM

## 2014-03-11 DIAGNOSIS — R079 Chest pain, unspecified: Secondary | ICD-10-CM

## 2014-03-11 LAB — CBC
HEMATOCRIT: 34.5 % — AB (ref 36.0–46.0)
HEMOGLOBIN: 11.3 g/dL — AB (ref 12.0–15.0)
MCH: 29.5 pg (ref 26.0–34.0)
MCHC: 32.8 g/dL (ref 30.0–36.0)
MCV: 90.1 fL (ref 78.0–100.0)
Platelets: 198 10*3/uL (ref 150–400)
RBC: 3.83 MIL/uL — ABNORMAL LOW (ref 3.87–5.11)
RDW: 14.1 % (ref 11.5–15.5)
WBC: 6.2 10*3/uL (ref 4.0–10.5)

## 2014-03-11 LAB — TROPONIN I
Troponin I: <0.3 ng/mL (ref <0.30)
Troponin I: <0.3 ng/mL (ref <0.30)

## 2014-03-11 LAB — CREATININE, SERUM
Creatinine, Ser: 0.78 mg/dL (ref 0.50–1.10)
GFR, EST AFRICAN AMERICAN: 87 mL/min — AB (ref >90)
GFR, EST NON AFRICAN AMERICAN: 75 mL/min — AB (ref >90)

## 2014-03-11 LAB — D-DIMER, QUANTITATIVE: D-Dimer, Quant: 0.59 ug/mL-FEU — ABNORMAL HIGH (ref 0.00–0.48)

## 2014-03-11 MED ORDER — LOSARTAN POTASSIUM 50 MG PO TABS
100.0000 mg | ORAL_TABLET | Freq: Every evening | ORAL | Status: DC
  Administered 2014-03-12: 100 mg via ORAL
  Filled 2014-03-11 (×3): qty 2

## 2014-03-11 MED ORDER — ASPIRIN EC 81 MG PO TBEC
81.0000 mg | DELAYED_RELEASE_TABLET | Freq: Every evening | ORAL | Status: DC
  Administered 2014-03-11 – 2014-03-12 (×2): 81 mg via ORAL
  Filled 2014-03-11 (×3): qty 1

## 2014-03-11 MED ORDER — ACETAMINOPHEN 325 MG PO TABS: 650.0000 mg | ORAL_TABLET | ORAL | Status: DC | PRN

## 2014-03-11 MED ORDER — METOPROLOL SUCCINATE ER 50 MG PO TB24
50.0000 mg | ORAL_TABLET | Freq: Two times a day (BID) | ORAL | Status: DC
  Administered 2014-03-11 – 2014-03-13 (×6): 50 mg via ORAL
  Filled 2014-03-11 (×7): qty 1

## 2014-03-11 MED ORDER — AMLODIPINE BESYLATE 5 MG PO TABS
5.0000 mg | ORAL_TABLET | Freq: Every evening | ORAL | Status: DC
  Administered 2014-03-11 – 2014-03-12 (×2): 5 mg via ORAL
  Filled 2014-03-11 (×3): qty 1

## 2014-03-11 MED ORDER — ENOXAPARIN SODIUM 40 MG/0.4ML ~~LOC~~ SOLN
40.0000 mg | Freq: Every day | SUBCUTANEOUS | Status: DC
  Administered 2014-03-11 – 2014-03-13 (×3): 40 mg via SUBCUTANEOUS
  Filled 2014-03-11 (×3): qty 0.4

## 2014-03-11 MED ORDER — ONDANSETRON HCL 4 MG/2ML IJ SOLN: 4.0000 mg | Freq: Three times a day (TID) | INTRAMUSCULAR | Status: DC | PRN

## 2014-03-11 MED ORDER — MORPHINE SULFATE 2 MG/ML IJ SOLN: 2.0000 mg | INTRAMUSCULAR | Status: DC | PRN

## 2014-03-11 MED ORDER — TRAMADOL HCL 50 MG PO TABS: 50.0000 mg | ORAL_TABLET | Freq: Four times a day (QID) | ORAL | Status: DC | PRN

## 2014-03-11 MED ORDER — PRAVASTATIN SODIUM 40 MG PO TABS
40.0000 mg | ORAL_TABLET | Freq: Every day | ORAL | Status: DC
  Administered 2014-03-11 – 2014-03-13 (×3): 40 mg via ORAL
  Filled 2014-03-11 (×3): qty 1

## 2014-03-11 MED ORDER — ONDANSETRON HCL 4 MG/2ML IJ SOLN: 4.0000 mg | Freq: Four times a day (QID) | INTRAMUSCULAR | Status: DC | PRN

## 2014-03-11 NOTE — Progress Notes (Signed)
80 year old lady camein for chest pain. Cardiology consulted and recommendations given.  Plan for Nuclear stress .   Hosie Poisson, MD [phone]

## 2014-03-11 NOTE — Consult Note (Signed)
Admit date: 03/10/2014 Referring Physician  Dr. Karleen Hampshire Primary Physician  Dr. Oletha Blend Primary Cardiologist  NONE Reason for Consultation  Chest pain  HPI: This is an 80yo WF with a history of CVA, HTN, dyslipidemia and anxiety who presented to the ER with complaints of CP.   She has had a 4 day hx of intermittent chest pain. First event 4 days ago. She sat up on the side of the bed and had a severe chest pain going down her left arm. She took a hot shower and it subsided. Since then she have been having off and on chest pain. These events last few minutes at a time. She has had some palpitations. Reports occasional worsening chest pain with breathing. At times the pain is radiating to left side of her neck and is described as a pressure or heaviness.  Occasionally she will break out in a sweat with the pain.   Currently she is chest pain free. CXR and troponin unremarkble. She had a negative stress test 5 years ago. Cardiology is now asked to consult for workup of chest pain.  Her Troponin is negative x 3.  EKG shows NSR with no ST changes. Currently she is CP free.    PMH:   Past Medical History  Diagnosis Date  . ANXIETY 03/05/2007  . ATAXIA 12/26/2009  . BEE STING 03/05/2007  . BRONCHITIS, ACUTE 06/28/2010  . CANDIDIASIS, VAGINAL 09/08/2008  . CEREBROVASCULAR ACCIDENT, HX OF 01/04/2010  . CERUMEN IMPACTION 06/05/2008  . DIZZINESS 12/26/2009  . Epistaxis 06/05/2008  . Headache(784.0) 06/05/2008  . HYPERLIPIDEMIA 12/08/2006  . HYPERTENSION 12/08/2006  . 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 NECK PAIN 06/05/2008  . OBESITY 09/07/2009  . ONYCHOMYCOSIS 03/09/2009  . OSTEOARTHRITIS 12/08/2006  . PARESTHESIA 12/26/2009  . TOBACCO USE, QUIT 03/09/2009     PSH:   Past Surgical History  Procedure Laterality Date  . Breast surgery    . Abdominal hysterectomy      Allergies:  Bee venom; Ceftin; and Spider antivenin Prior to Admit Meds:   Facility-administered medications prior to admission  Medication  Dose Route Frequency Provider Last Rate Last Dose  . methylPREDNISolone acetate (DEPO-MEDROL) injection 80 mg  80 mg Intra-articular Once Aleksei Plotnikov V, MD      . methylPREDNISolone acetate (DEPO-MEDROL) injection 80 mg  80 mg Intra-articular Once Lew Dawes V, MD       Prescriptions prior to admission  Medication Sig Dispense Refill  . amLODipine (NORVASC) 5 MG tablet Take 5 mg by mouth every evening.      [name] aspirin 81 MG tablet Take 81 mg by mouth every evening.       . Cholecalciferol (VITAMIN D) 1000 UNITS capsule Take 1 capsule (1,000 Units total) by mouth daily.  100 capsule  3  . fish oil-omega-3 fatty acids 1000 MG capsule Take 1 g by mouth daily.        [name] losartan (COZAAR) 100 MG tablet Take 100 mg by mouth every evening.      . metoprolol succinate (TOPROL-XL) 50 MG 24 hr tablet Take 1 tablet (50 mg total) by mouth 2 (two) times daily.  180 tablet  3  . Multiple Vitamin (MULTIVITAMIN) tablet Take 1 tablet by mouth daily.        . pravastatin (PRAVACHOL) 40 MG tablet Take 1 tablet (40 mg total) by mouth daily.  90 tablet  3  . traMADol (ULTRAM) 50 MG tablet Take 50-100 mg by mouth every 6 (six)  hours as needed for moderate pain.      . vitamin C (ASCORBIC ACID) 500 MG tablet Take 500 mg by mouth daily.        . vitamin E 400 UNIT capsule Take 400 Units by mouth daily.         Fam HX:    Family History  Problem Relation Age of Onset  . Hypertension Other   . Hypertension Mother   . Hypertension Father    Social HX:    History   Social History  . Marital Status: Widowed    Spouse Name: N/A    Number of Children: N/A  . Years of Education: N/A   Occupational History  . Not on file.   Social History Main Topics  . Smoking status: Former Research scientist (life sciences)  . Smokeless tobacco: Not on file  . Alcohol Use: No  . Drug Use: No  . Sexual Activity: No   Other Topics Concern  . Not on file   Social History Narrative  . No narrative on file     ROS:  All 11 ROS were  addressed and are negative except what is stated in the HPI  Physical Exam: Blood pressure 149/87, pulse 70, temperature 97.8 F (36.6 C), temperature source Oral, resp. rate 14, SpO2 100.00%.    General: Well developed, well nourished, in no acute distress Head: Eyes PERRLA, No xanthomas.   Normal cephalic and atramatic  Lungs:   Clear bilaterally to auscultation and percussion. Heart:   HRRR S1 S2 Pulses are 2+ & equal.  No bruits or JVD Abdomen: Bowel sounds are positive, abdomen soft and non-tender without masses  Extremities:   No clubbing, cyanosis or edema.  DP +1 Neuro: Alert and oriented X 3. Psych:  Good affect, responds appropriately    Labs:   Lab Results  Component Value Date   WBC 6.2 03/11/2014   HGB 11.3* 03/11/2014   HCT 34.5* 03/11/2014   MCV 90.1 03/11/2014   PLT 198 03/11/2014    Recent Labs Lab 03/10/14 2014 03/11/14 0230  NA 141  --   K 4.3  --   CL 105  --   CO2 26  --   BUN 19  --   CREATININE 0.86 0.78  CALCIUM 9.3  --   GLUCOSE 97  --    No results found for this basename: PTT   No results found for this basename: INR, PROTIME   Lab Results  Component Value Date   TROPONINI <0.30 03/11/2014     Lab Results  Component Value Date   CHOL 129 09/06/2012   CHOL 175 02/28/2009   CHOL 162 08/31/2008   Lab Results  Component Value Date   HDL 33.90* 09/06/2012   HDL 55.10 02/28/2009   HDL 54.20 08/31/2008   Lab Results  Component Value Date   LDLCALC 83 09/06/2012   LDLCALC 106* 02/28/2009   LDLCALC 97 08/31/2008   Lab Results  Component Value Date   TRIG 63.0 09/06/2012   TRIG 68.0 02/28/2009   TRIG 53.0 08/31/2008   Lab Results  Component Value Date   CHOLHDL 4 09/06/2012   CHOLHDL 3 02/28/2009   CHOLHDL 3 08/31/2008   No results found for this basename: LDLDIRECT      Radiology:  Dg Chest 2 View  03/10/2014   CLINICAL DATA:  Initial evaluation chest pain  EXAM: CHEST  2 VIEW  COMPARISON:  04/01/2013  FINDINGS: Heart size and vascular  pattern normal. Aortic  ectasia. Lungs clear. No effusions.  IMPRESSION: No active cardiopulmonary disease.   Electronically Signed   By: Skipper Cliche M.D.   On: 03/10/2014 21:31    EKG:  NSR with no ST changes  ASSESSMENT:  1.  Chest pain with typical and atypical components with negative cardiac enzymes and normal EKG.  Her CP is nonexertional but is described as a pressure that radiates into her neck and left arm and she breaks out in a sweat with it.  This is concerning for ischemia.  Her CRF include her advanced age, HTN, dyslipidemia and remote tobacco use. D-Dimer is minimally elevated. 2.  HTN 3.  Dyslipidemia  PLAN:   1.  NPO after MN will plan 2 day lexiscan myoview.  I will try to get the rest images today.   2.  Check 2D echo to assess LVF 3.  Consider chest CT to rule out PE although unlikely given her symptoms. Her CP is not really pleuritic but more of a heaviness.   She has LE venous dopplers pending  Sueanne Margarita, MD  03/11/2014  12:50 PM

## 2014-03-11 NOTE — Progress Notes (Signed)
Dr. Radford Pax and PA paged, no return page. Notified that [facility] does not do stress test on weekends unless it an emergency, pt has been NPO today, will place diet order and place pt NPO after midnight per Dr. Karleen Hampshire. Sherrie Mustache 4:19 PM

## 2014-03-11 NOTE — H&P (Signed)
XTG:GYIR Plotnikov, MD    Chief Complaint:  Chest pain HPI: Mindy Taylor is a 80 y.o. female   has a past medical history of ANXIETY (03/05/2007); ATAXIA (12/26/2009); BEE STING (03/05/2007); BRONCHITIS, ACUTE (06/28/2010); CANDIDIASIS, VAGINAL (09/08/2008); CEREBROVASCULAR ACCIDENT, HX OF (01/04/2010); CERUMEN IMPACTION (06/05/2008); DIZZINESS (12/26/2009); Epistaxis (06/05/2008); SWNIOEVO(350.0) (06/05/2008); HYPERLIPIDEMIA (12/08/2006); HYPERTENSION (12/08/2006); LACUNAR INFARCTION (01/04/2010); NECK PAIN (06/05/2008); OBESITY (09/07/2009); ONYCHOMYCOSIS (03/09/2009); OSTEOARTHRITIS (12/08/2006); PARESTHESIA (12/26/2009); and TOBACCO USE, QUIT (03/09/2009).   Presented with  4 day hx of intermittent chest pain. First event 4 days ago. She set up on the side of the bed and had a severe chest pain going down left arm. She took a hot shower and it subsided. Since then she have been having off and on chest pain. This events last few minutes at a time. She have had some palpitations. Reports occasional worsening chest pain with breathing. At times the pain is radiating to left side of her neck. Currently she is chest pain free. CXR and troponin unremarkble. She had a negative stress test 5 years ago. Patient have have had persistent left ankle swelling and pain after ankle injury and wears a brace.   Patient is Jahova's witness.   Hospitalist was called for admission for chest pain with risk factors worrisome for angina  Review of Systems:    Pertinent positives include:  chest pain,   Constitutional:  No weight loss, night sweats, Fevers, chills, fatigue, weight loss  HEENT:  No headaches, Difficulty swallowing,Tooth/dental problems,Sore throat,  No sneezing, itching, ear ache, nasal congestion, post nasal drip,  Cardio-vascular:  No Orthopnea, PND, anasarca, dizziness, palpitations.no Bilateral lower extremity swelling  GI:  No heartburn, indigestion, abdominal pain, nausea, vomiting, diarrhea, change  in bowel habits, loss of appetite, melena, blood in stool, hematemesis Resp:  no shortness of breath at rest. No dyspnea on exertion, No excess mucus, no productive cough, No non-productive cough, No coughing up of blood.No change in color of mucus.No wheezing. Skin:  no rash or lesions. No jaundice GU:  no dysuria, change in color of urine, no urgency or frequency. No straining to urinate.  No flank pain.  Musculoskeletal:  No joint pain or no joint swelling. No decreased range of motion. No back pain.  Psych:  No change in mood or affect. No depression or anxiety. No memory loss.  Neuro: no localizing neurological complaints, no tingling, no weakness, no double vision, no gait abnormality, no slurred speech, no confusion  Otherwise ROS are negative except for above, 10 systems were reviewed  Past Medical History: Past Medical History  Diagnosis Date  . ANXIETY 03/05/2007  . ATAXIA 12/26/2009  . BEE STING 03/05/2007  . BRONCHITIS, ACUTE 06/28/2010  . CANDIDIASIS, VAGINAL 09/08/2008  . CEREBROVASCULAR ACCIDENT, HX OF 01/04/2010  . CERUMEN IMPACTION 06/05/2008  . DIZZINESS 12/26/2009  . Epistaxis 06/05/2008  . Headache(784.0) 06/05/2008  . HYPERLIPIDEMIA 12/08/2006  . HYPERTENSION 12/08/2006  . 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 NECK PAIN 06/05/2008  . OBESITY 09/07/2009  . ONYCHOMYCOSIS 03/09/2009  . OSTEOARTHRITIS 12/08/2006  . PARESTHESIA 12/26/2009  . TOBACCO USE, QUIT 03/09/2009   Past Surgical History  Procedure Laterality Date  . Breast surgery    . Abdominal hysterectomy      Medications: Prior to Admission medications   Medication Sig Start Date End Date Taking? Authorizing Provider  amLODipine (NORVASC) 5 MG tablet Take 5 mg by mouth every evening.   Yes Historical Provider, MD  aspirin 81 MG tablet Take 81 mg  by mouth every evening.    Yes Historical Provider, MD  Cholecalciferol (VITAMIN D) 1000 UNITS capsule Take 1 capsule (1,000 Units total) by mouth daily. 01/03/11  Yes  Aleksei Plotnikov V, MD  fish oil-omega-3 fatty acids 1000 MG capsule Take 1 g by mouth daily.     Yes Historical Provider, MD  losartan (COZAAR) 100 MG tablet Take 100 mg by mouth every evening.   Yes Historical Provider, MD  metoprolol succinate (TOPROL-XL) 50 MG 24 hr tablet Take 1 tablet (50 mg total) by mouth 2 (two) times daily. 11/22/13  Yes Aleksei Plotnikov V, MD  Multiple Vitamin (MULTIVITAMIN) tablet Take 1 tablet by mouth daily.     Yes Historical Provider, MD  pravastatin (PRAVACHOL) 40 MG tablet Take 1 tablet (40 mg total) by mouth daily. 02/01/14  Yes Aleksei Plotnikov V, MD  traMADol (ULTRAM) 50 MG tablet Take 50-100 mg by mouth every 6 (six) hours as needed for moderate pain.   Yes Historical Provider, MD  vitamin C (ASCORBIC ACID) 500 MG tablet Take 500 mg by mouth daily.     Yes Historical Provider, MD  vitamin E 400 UNIT capsule Take 400 Units by mouth daily.     Yes Historical Provider, MD    Allergies:   Allergies  Allergen Reactions  . Bee Venom Swelling  . Ceftin [Cefuroxime Axetil]     Felt wierd  . Spider Antivenin [Antivenin Latrodectus Mactans] Other (See Comments)    pain    Social History:  Ambulatory   Independently  Lives at home alone,       reports that she has quit smoking. She does not have any smokeless tobacco history on file. She reports that she does not drink alcohol or use illicit drugs.    Family History: family history includes Hypertension in her father, mother, and other.    Physical Exam: Patient Vitals for the past 24 hrs:  BP Temp Pulse Resp SpO2  03/10/14 2330 136/114 mmHg - 68 15 98 %  03/10/14 2300 142/73 mmHg - 78 21 100 %  03/10/14 2221 134/65 mmHg - 80 18 99 %  03/10/14 1959 162/81 mmHg 98.3 F (36.8 C) 88 18 100 %    1. General:  in No Acute distress 2. Psychological: Alert and Oriented 3. Head/ENT:   Moist  Mucous Membranes                          Head Non traumatic, neck supple                          Normal   Dentition 4. SKIN: normal  Skin turgor,  Skin clean Dry and intact no rash 5. Heart: Regular rate and rhythm no Murmur, Rub or gallop 6. Lungs: Clear to auscultation bilaterally, no wheezes or crackles   7. Abdomen: Soft, non-tender, Non distended 8. Lower extremities: no clubbing, cyanosis, trace left leg edema 9. Neurologically Grossly intact, moving all 4 extremities equally 10. MSK: Normal range of motion  body mass index is unknown because there is no weight on file.   Labs on Admission:   Recent Labs  03/10/14 2014  NA 141  K 4.3  CL 105  CO2 26  GLUCOSE 97  BUN 19  CREATININE 0.86  CALCIUM 9.3   No results found for this basename: AST, ALT, ALKPHOS, BILITOT, PROT, ALBUMIN,  in the last 72 hours No results found  for this basename: LIPASE, AMYLASE,  in the last 72 hours  Recent Labs  03/10/14 2014  WBC 6.0  HGB 12.1  HCT 36.1  MCV 89.6  PLT 200   No results found for this basename: CKTOTAL, CKMB, CKMBINDEX, TROPONINI,  in the last 72 hours No results found for this basename: TSH, T4TOTAL, FREET3, T3FREE, THYROIDAB,  in the last 72 hours No results found for this basename: VITAMINB12, FOLATE, FERRITIN, TIBC, IRON, RETICCTPCT,  in the last 72 hours Lab Results  Component Value Date   HGBA1C 5.7 08/11/2011    The CrCl is unknown because both a height and weight (above a minimum accepted value) are required for this calculation. ABG No results found for this basename: phart, pco2, po2, hco3, tco2, acidbasedef, o2sat     No results found for this basename: DDIMER     Other results:  I have pearsonaly reviewed this: ECG REPORT  Rate: 86  Rhythm: NSR ST&T Change: no ischemic changes  BNP (last 3 results) No results found for this basename: PROBNP,  in the last 8760 hours  There were no vitals filed for this visit.   Cultures: No results found for this basename: sdes, specrequest, cult, reptstatus    Radiological Exams on Admission: Dg Chest 2  View  03/10/2014   CLINICAL DATA:  Initial evaluation chest pain  EXAM: CHEST  2 VIEW  COMPARISON:  04/01/2013  FINDINGS: Heart size and vascular pattern normal. Aortic ectasia. Lungs clear. No effusions.  IMPRESSION: No active cardiopulmonary disease.   Electronically Signed   By: Skipper Cliche M.D.   On: 03/10/2014 21:31    Chart has been reviewed  Assessment/Plan  80 yo F with hx of HTN, Hypercholesteremia here with intermittent Chest Pain  Present on Admission:  . Chest pain - - given risk factors will admit, monitor on telemetry, cycle cardiac enzymes, obtain serial ECG. Further risk stratify with lipid panel, hgA1C, obtain TSH. Make sure patient is on Aspirin. Further treatment based on the currently pending results. NPO for possible stress test. No ECG changes, troponin negative so far HEART score 5 . Essential hypertension - continue home meds Left leg swelling - obtain doppler, check d.dimer given some pleuritic component.    Prophylaxis: Lovenox, Protonix  CODE STATUS:  FULL CODE    Other plan as per orders.  I have spent a total of 55 min on this admission  Beverley Sherrard 03/11/2014, 12:16 AM  [facility]  Pager [phone]   after 2 AM please page floor coverage PA If 7AM-7PM, please contact the day team taking care of the patient  [email or web address]  Password [email or web address]

## 2014-03-11 NOTE — Progress Notes (Signed)
*  PRELIMINARY RESULTS* Vascular Ultrasound Lower extremity venous duplex has been completed.  Preliminary findings: no evidence of DVT  Landry Mellow, RDMS, RVT  03/11/2014, 10:42 AM

## 2014-03-11 NOTE — Progress Notes (Signed)
Dr. Karleen Hampshire paged, wants stress test, no order as of present and pt NPO. Per MD okay to order heart healthy diet, cardiology to see pt. Kathleen Argue S 11:41 AM

## 2014-03-12 ENCOUNTER — Inpatient Hospital Stay (HOSPITAL_COMMUNITY): Payer: Medicare Other

## 2014-03-12 DIAGNOSIS — I059 Rheumatic mitral valve disease, unspecified: Secondary | ICD-10-CM

## 2014-03-12 MED ORDER — IOHEXOL 350 MG/ML SOLN
70.0000 mL | Freq: Once | INTRAVENOUS | Status: AC | PRN
  Administered 2014-03-12: 70 mL via INTRAVENOUS

## 2014-03-12 MED ORDER — TECHNETIUM TC 99M SESTAMIBI - CARDIOLITE
30.0000 | Freq: Once | INTRAVENOUS | Status: AC | PRN
  Administered 2014-03-12: 30 via INTRAVENOUS

## 2014-03-12 MED ORDER — REGADENOSON 0.4 MG/5ML IV SOLN
INTRAVENOUS | Status: AC
  Administered 2014-03-12: 0.4 mg via INTRAVENOUS
  Filled 2014-03-12: qty 5

## 2014-03-12 MED ORDER — REGADENOSON 0.4 MG/5ML IV SOLN
0.4000 mg | Freq: Once | INTRAVENOUS | Status: AC
  Administered 2014-03-12: 0.4 mg via INTRAVENOUS
  Filled 2014-03-12: qty 5

## 2014-03-12 MED ORDER — TECHNETIUM TC 99M SESTAMIBI GENERIC - CARDIOLITE
10.0000 | Freq: Once | INTRAVENOUS | Status: AC | PRN
  Administered 2014-03-12: 10 via INTRAVENOUS

## 2014-03-12 NOTE — Progress Notes (Signed)
[facility] PROGRESS NOTE  Mindy Taylor UUV:[id number] DOB: 1928-09-02 DOA: 03/10/2014 PCP: Walker Kehr, MD  Assessment/Plan: 1. Chest pain: ACS is ruled out. Nuclear steess done and results pending.  CT angio lungs ordered and is neg for PE.    2. Hypertension: Controlled.   Code Status: full code Family Communication: multiple family members at bedside Disposition Plan: pending results.    Consultants:  cardiology  Procedures:  Stress test  CT angio of the lungs  Antibiotics:  none  HPI/Subjective: No chest pain, cheerful,   Objective: Filed Vitals:   03/12/14 1037  BP: 143/71  Pulse: 78  Temp:   Resp:     Intake/Output Summary (Last 24 hours) at 03/12/14 1546 Last data filed at 03/12/14 0955  Gross per 24 hour  Intake    120 ml  Output      0 ml  Net    120 ml   Filed Weights   03/12/14 0700  Weight: 104.962 kg (231 lb 6.4 oz)    Exam:   General:  alert afebrile comfortable  Cardiovascular:s1s2  Respiratory: ctab  Abdomen: soft NT ND BS+  Musculoskeletal: no pedal edema.   Data Reviewed: Basic Metabolic Panel:  Recent Labs Lab 03/10/14 2014 03/11/14 0230  NA 141  --   K 4.3  --   CL 105  --   CO2 26  --   GLUCOSE 97  --   BUN 19  --   CREATININE 0.86 0.78  CALCIUM 9.3  --    Liver Function Tests: No results found for this basename: AST, ALT, ALKPHOS, BILITOT, PROT, ALBUMIN,  in the last 168 hours No results found for this basename: LIPASE, AMYLASE,  in the last 168 hours No results found for this basename: AMMONIA,  in the last 168 hours CBC:  Recent Labs Lab 03/10/14 2014 03/11/14 0230  WBC 6.0 6.2  HGB 12.1 11.3*  HCT 36.1 34.5*  MCV 89.6 90.1  PLT 200 198   Cardiac Enzymes:  Recent Labs Lab 03/11/14 0030 03/11/14 0621 03/11/14 1230  TROPONINI <0.30 <0.30 <0.30   BNP (last 3 results) No results found for this basename: PROBNP,  in the last 8760 hours CBG: No results found for this basename:  GLUCAP,  in the last 168 hours  No results found for this or any previous visit (from the past 240 hour(s)).   Studies: Dg Chest 2 View  03/10/2014   CLINICAL DATA:  Initial evaluation chest pain  EXAM: CHEST  2 VIEW  COMPARISON:  04/01/2013  FINDINGS: Heart size and vascular pattern normal. Aortic ectasia. Lungs clear. No effusions.  IMPRESSION: No active cardiopulmonary disease.   Electronically Signed   By: Skipper Cliche M.D.   On: 03/10/2014 21:31   Ct Angio Chest Pe W/cm &/or Wo Cm  03/12/2014   CLINICAL DATA:  80 year old with anterior chest pain. Positive D-dimer.  EXAM: CT ANGIOGRAPHY CHEST WITH CONTRAST  TECHNIQUE: Multidetector CT imaging of the chest was performed using the standard protocol during bolus administration of intravenous contrast. Multiplanar CT image reconstructions and MIPs were obtained to evaluate the vascular anatomy.  CONTRAST:  86mL OMNIPAQUE IOHEXOL 350 MG/ML SOLN  COMPARISON:  Chest radiograph 03/10/2014  FINDINGS: Negative for pulmonary embolism. There is asymmetric thyroid tissue with an enlarged left thyroid lobe and no significant right thyroid tissue. The left thyroid tissue extends into the substernal region. Normal appearance of the thoracic aorta without dissection or dilatation. There is a very small hiatal  hernia. No evidence for chest lymphadenopathy. There are a few coronary artery calcifications. 3.0 cm low-density structure in the posterior right hepatic lobe is most compatible with a cyst. No acute abnormalities in the upper abdomen.  The trachea and mainstem bronchi are patent. 2 mm nodular density in the left upper lung on sequence 406, image 28. Otherwise, the lungs are clear. No evidence for airspace disease or consolidation. No acute bone abnormality.  Review of the MIP images confirms the above findings.  IMPRESSION: Negative for pulmonary embolism.  No acute chest abnormality.  Punctate nodular density in the left upper lung. If the patient is at  high risk for bronchogenic carcinoma, follow-up chest CT at 1 year is recommended. If the patient is at low risk, no follow-up is needed. This recommendation follows the consensus statement: Guidelines for Management of Small Pulmonary Nodules Detected on CT Scans: A Statement from the [employer] as published in Radiology 2005; 237:395-400.  Large left thyroid lobe as described.   Electronically Signed   By: Markus Daft M.D.   On: 03/12/2014 13:53   Nm Myocar Multi W/spect W/wall Motion / Ef  03/12/2014   CLINICAL DATA:  Chest pain, hypertension and elevated lipids.  EXAM: MYOCARDIAL IMAGING WITH SPECT (REST AND PHARMACOLOGIC-STRESS)  GATED LEFT VENTRICULAR WALL MOTION STUDY  LEFT VENTRICULAR EJECTION FRACTION  TECHNIQUE: Standard myocardial SPECT imaging was performed after resting intravenous injection of 10 mCi Tc-47m sestamibi. Subsequently, intravenous infusion of Lexiscan was performed under the supervision of the Cardiology staff. At peak effect of the drug, 30 mCi Tc-75m sestamibi was injected intravenously and standard myocardial SPECT imaging was performed. Quantitative gated imaging was also performed to evaluate left ventricular wall motion, and estimate left ventricular ejection fraction.  COMPARISON:  None.  FINDINGS: Perfusion: No decreased activity in the left ventricle on stress imaging to suggest reversible ischemia or infarction.  Wall Motion: Normal left ventricular wall motion. No left ventricular dilation.  Left Ventricular Ejection Fraction: 69 %  End diastolic volume 82 ml  End systolic volume 26 ml  IMPRESSION: 1. No reversible ischemia or infarction.  2. Normal left ventricular wall motion.  3. Left ventricular ejection fraction 69%  4. Low-risk stress test findings*.  *2012 Appropriate Use Criteria for Coronary Revascularization Focused Update: J Am Coll Cardiol. 3151;76(1):607-371. http://content.airportbarriers.com.aspx?articleid=1201161   Electronically Signed   By: Kerby Moors M.D.   On: 03/12/2014 14:02    Scheduled Meds: . amLODipine  5 mg Oral QPM  . aspirin EC  81 mg Oral QPM  . enoxaparin (LOVENOX) injection  40 mg Subcutaneous Daily  . losartan  100 mg Oral QPM  . metoprolol succinate  50 mg Oral BID  . pravastatin  40 mg Oral Daily   Continuous Infusions:   Active Problems:   Essential hypertension   Left ankle swelling   Chest pain   No blood products    Time spent: 25 min    [facility] Pager [phone] If 7PM-7AM, please contact night-coverage at [email or web address], password [facility] 03/12/2014, 3:46 PM  LOS: 2 days

## 2014-03-12 NOTE — Progress Notes (Signed)
Dr. Radford Pax okay with starting diet. Kathleen Argue S 10:50 AM

## 2014-03-12 NOTE — Progress Notes (Signed)
SUBJECTIVE:  denies chest pain or SOB  OBJECTIVE:   Vitals:   Filed Vitals:   03/11/14 1416 03/11/14 1721 03/11/14 2035 03/12/14 0700  BP: 127/60 114/68 114/65 125/63  Pulse: 72  73 67  Temp: 98 F (36.7 C)  98.8 F (37.1 C) 98.1 F (36.7 C)  TempSrc: Oral  Oral Oral  Resp: 18  18 19   Weight:    231 lb 6.4 oz (104.962 kg)  SpO2: 96%  98% 100%   I&O's:   Intake/Output Summary (Last 24 hours) at 03/12/14 0753 Last data filed at 03/11/14 1833  Gross per 24 hour  Intake    120 ml  Output      0 ml  Net    120 ml   TELEMETRY: Reviewed telemetry pt in NSR:     PHYSICAL EXAM General: Well developed, well nourished, in no acute distress Lungs:   Clear bilaterally to auscultation and percussion. Heart:   HRRR S1 S2 Pulses are 2+ & equal. Abdomen: Bowel sounds are positive, abdomen soft and non-tender without masses  Extremities:   No clubbing, cyanosis or edema.  DP +1 Neuro: Alert and oriented X 3. Psych:  Good affect, responds appropriately   LABS: Basic Metabolic Panel:  Recent Labs  03/10/14 2014 03/11/14 0230  NA 141  --   K 4.3  --   CL 105  --   CO2 26  --   GLUCOSE 97  --   BUN 19  --   CREATININE 0.86 0.78  CALCIUM 9.3  --    Liver Function Tests: No results found for this basename: AST, ALT, ALKPHOS, BILITOT, PROT, ALBUMIN,  in the last 72 hours No results found for this basename: LIPASE, AMYLASE,  in the last 72 hours CBC:  Recent Labs  03/10/14 2014 03/11/14 0230  WBC 6.0 6.2  HGB 12.1 11.3*  HCT 36.1 34.5*  MCV 89.6 90.1  PLT 200 198   Cardiac Enzymes:  Recent Labs  03/11/14 0030 03/11/14 0621 03/11/14 1230  TROPONINI <0.30 <0.30 <0.30   BNP: No components found with this basename: POCBNP,  D-Dimer:  Recent Labs  03/11/14 0230  DDIMER 0.59*   Hemoglobin A1C: No results found for this basename: HGBA1C,  in the last 72 hours Fasting Lipid Panel: No results found for this basename: CHOL, HDL, LDLCALC, TRIG, CHOLHDL,  LDLDIRECT,  in the last 72 hours Thyroid Function Tests: No results found for this basename: TSH, T4TOTAL, FREET3, T3FREE, THYROIDAB,  in the last 72 hours Anemia Panel: No results found for this basename: VITAMINB12, FOLATE, FERRITIN, TIBC, IRON, RETICCTPCT,  in the last 72 hours Coag Panel:   No results found for this basename: INR, PROTIME    RADIOLOGY: Dg Chest 2 View  03/10/2014   CLINICAL DATA:  Initial evaluation chest pain  EXAM: CHEST  2 VIEW  COMPARISON:  04/01/2013  FINDINGS: Heart size and vascular pattern normal. Aortic ectasia. Lungs clear. No effusions.  IMPRESSION: No active cardiopulmonary disease.   Electronically Signed   By: Skipper Cliche M.D.   On: 03/10/2014 21:31   ASSESSMENT:  1. Chest pain with typical and atypical components with negative cardiac enzymes and normal EKG. Her CP is nonexertional but is described as a pressure that radiates into her neck and left arm and she breaks out in a sweat with it. This is concerning for ischemia. Her CRF include her advanced age, HTN, dyslipidemia and remote tobacco use. D-Dimer is minimally elevated but LE venous  dopplers were negative.  2. HTN - controlled on amlodipine/losartan/BB 3. Dyslipidemia - continue statin  PLAN:  1. NPO for lexiscan myoview.  2. Check 2D echo to assess LVF   Sueanne Margarita, MD  03/12/2014  7:53 AM

## 2014-03-12 NOTE — Progress Notes (Signed)
Echocardiogram 2D Echocardiogram has been performed.  Mindy Taylor 03/12/2014, 1:37 PM

## 2014-03-12 NOTE — Progress Notes (Addendum)
Dr. Karleen Hampshire paged, dc planning. Sherrie Mustache 4:37 PM   A/w cardiology to read results of cardiac exams, Dr. Karleen Hampshire waiting to discharge patient. Sherrie Mustache 4:58 PM

## 2014-03-13 ENCOUNTER — Other Ambulatory Visit (HOSPITAL_COMMUNITY): Payer: Medicare Other

## 2014-03-13 MED ORDER — PANTOPRAZOLE SODIUM 40 MG PO TBEC: 40.0000 mg | DELAYED_RELEASE_TABLET | Freq: Every day | ORAL | Status: DC

## 2014-03-13 NOTE — Progress Notes (Signed)
Discharge education completed by RN. Pt  received a copy of discharge paperwork and confirms understanding of follow up appointments and discharge medications. Patient denies any questions at this time. IV removed, site is within normal limits. Pt will discharge from the unit via wheelchair. 

## 2014-03-13 NOTE — Progress Notes (Signed)
Patient Name: Mindy Taylor Date of Encounter: 03/13/2014     Active Problems:   Essential hypertension   Left ankle swelling   Chest pain   No blood products    SUBJECTIVE  No further complaints. Wants to go home  CURRENT MEDS . amLODipine  5 mg Oral QPM  . aspirin EC  81 mg Oral QPM  . enoxaparin (LOVENOX) injection  40 mg Subcutaneous Daily  . losartan  100 mg Oral QPM  . metoprolol succinate  50 mg Oral BID  . pravastatin  40 mg Oral Daily    OBJECTIVE  Filed Vitals:   03/12/14 1550 03/12/14 1713 03/12/14 2005 03/13/14 0849  BP: 119/64 131/92 116/68 135/66  Pulse: 82  73 71  Temp: 97.8 F (36.6 C)  98.3 F (36.8 C) 98.7 F (37.1 C)  TempSrc: Oral  Oral Oral  Resp: 19  18   Height:      Weight:      SpO2: 100%  100% 100%    Intake/Output Summary (Last 24 hours) at 03/13/14 0915 Last data filed at 03/13/14 0800  Gross per 24 hour  Intake    720 ml  Output      0 ml  Net    720 ml   Filed Weights   03/12/14 0700  Weight: 231 lb 6.4 oz (104.962 kg)    PHYSICAL EXAM  General: Pleasant, NAD. Neuro: Alert and oriented X 3. Moves all extremities spontaneously. Psych: Normal affect. HEENT:  Normal  Neck: Supple without bruits or JVD. Lungs:  Resp regular and unlabored, CTA. Heart: RRR no s3, s4, or murmurs. Abdomen: Soft, non-tender, non-distended, BS + x 4.  Extremities: No clubbing, cyanosis or edema. DP/PT/Radials 2+ and equal bilaterally.  Accessory Clinical Findings  CBC  Recent Labs  03/10/14 2014 03/11/14 0230  WBC 6.0 6.2  HGB 12.1 11.3*  HCT 36.1 34.5*  MCV 89.6 90.1  PLT 200 086   Basic Metabolic Panel  Recent Labs  03/10/14 2014 03/11/14 0230  NA 141  --   K 4.3  --   CL 105  --   CO2 26  --   GLUCOSE 97  --   BUN 19  --   CREATININE 0.86 0.78  CALCIUM 9.3  --     Cardiac Enzymes  Recent Labs  03/11/14 0030 03/11/14 0621 03/11/14 1230  TROPONINI <0.30 <0.30 <0.30   D-Dimer  Recent Labs   03/11/14 0230  DDIMER 0.59*    TELE  NSR  Radiology/Studies  Dg Chest 2 View  03/10/2014   CLINICAL DATA:  Initial evaluation chest pain  EXAM: CHEST  2 VIEW  COMPARISON:  04/01/2013  FINDINGS: Heart size and vascular pattern normal. Aortic ectasia. Lungs clear. No effusions.  IMPRESSION: No active cardiopulmonary disease.   Electronically Signed   By: Skipper Cliche M.D.   On: 03/10/2014 21:31   Ct Angio Chest Pe W/cm &/or Wo Cm  03/12/2014   CLINICAL DATA:  80 year old with anterior chest pain. Positive D-dimer.  EXAM: CT ANGIOGRAPHY CHEST WITH CONTRAST  TECHNIQUE: Multidetector CT imaging of the chest was performed using the standard protocol during bolus administration of intravenous contrast. Multiplanar CT image reconstructions and MIPs were obtained to evaluate the vascular anatomy.  CONTRAST:  20mL OMNIPAQUE IOHEXOL 350 MG/ML SOLN  COMPARISON:  Chest radiograph 03/10/2014  FINDINGS: Negative for pulmonary embolism. There is asymmetric thyroid tissue with an enlarged left thyroid lobe and no significant right thyroid tissue. The  left thyroid tissue extends into the substernal region. Normal appearance of the thoracic aorta without dissection or dilatation. There is a very small hiatal hernia. No evidence for chest lymphadenopathy. There are a few coronary artery calcifications. 3.0 cm low-density structure in the posterior right hepatic lobe is most compatible with a cyst. No acute abnormalities in the upper abdomen.  The trachea and mainstem bronchi are patent. 2 mm nodular density in the left upper lung on sequence 406, image 28. Otherwise, the lungs are clear. No evidence for airspace disease or consolidation. No acute bone abnormality.  Review of the MIP images confirms the above findings.  IMPRESSION: Negative for pulmonary embolism.  No acute chest abnormality.  Punctate nodular density in the left upper lung. If the patient is at high risk for bronchogenic carcinoma, follow-up chest  CT at 1 year is recommended. If the patient is at low risk, no follow-up is needed. This recommendation follows the consensus statement: Guidelines for Management of Small Pulmonary Nodules Detected on CT Scans: A Statement from the [place] as published in Radiology 2005; 237:395-400.  Large left thyroid lobe as described.   Electronically Signed   By: Markus Daft M.D.   On: 03/12/2014 13:53   Nm Myocar Multi W/spect W/wall Motion / Ef  03/12/2014   CLINICAL DATA:  Chest pain, hypertension and elevated lipids.  EXAM: MYOCARDIAL IMAGING WITH SPECT (REST AND PHARMACOLOGIC-STRESS)  GATED LEFT VENTRICULAR WALL MOTION STUDY  LEFT VENTRICULAR EJECTION FRACTION  TECHNIQUE: Standard myocardial SPECT imaging was performed after resting intravenous injection of 10 mCi Tc-6m sestamibi. Subsequently, intravenous infusion of Lexiscan was performed under the supervision of the Cardiology staff. At peak effect of the drug, 30 mCi Tc-30m sestamibi was injected intravenously and standard myocardial SPECT imaging was performed. Quantitative gated imaging was also performed to evaluate left ventricular wall motion, and estimate left ventricular ejection fraction.  COMPARISON:  None.  FINDINGS: Perfusion: No decreased activity in the left ventricle on stress imaging to suggest reversible ischemia or infarction.  Wall Motion: Normal left ventricular wall motion. No left ventricular dilation.  Left Ventricular Ejection Fraction: 69 %  End diastolic volume 82 ml  End systolic volume 26 ml  IMPRESSION: 1. No reversible ischemia or infarction.  2. Normal left ventricular wall motion.  3. Left ventricular ejection fraction 69%  4. Low-risk stress test findings*.  *2012 Appropriate Use Criteria for Coronary Revascularization Focused Update: J Am Coll Cardiol. 2440;10(2):725-366. http://content.[email or web address]?articleid=1201161   Electronically Signed   By: Kerby Moors M.D.   On: 03/12/2014 14:02    ASSESSMENT  AND PLAN Mindy Taylor is a 80 y.o. female with a history of CVA, HTN, dyslipidemia and anxiety who presented to the ED on 03/11/14 with complaints of CP.  Chest pain with typical and atypical components with negative cardiac enzymes and normal EKG. Her CP is nonexertional but is described as a pressure that radiates into her neck and left arm and she breaks out in a sweat with it. This is concerning for ischemia. Her CRF include her advanced age, HTN, dyslipidemia and remote tobacco use. -- Leane Call on 03/12/14 low risk. No reversible ischemia or infarction, normal left ventricular wall motion, EF 69% -- 2D echo 03/12/14 with EF 55-60%: mild LVH, no RWMA, G1DD, mild MR, PA pk pressure 40 -- Can likely be discharged from a cardiology standpoint. MD to see.   Elevated D-Dimer- but negative LE venous dopplers. CTA neg for PE  HTN - controlled on amlodipine/losartan/BB  Dyslipidemia - continue statin     Judy Pimple PA-C  Pager [phone]    Patient seen and examined. Agree with assessment and plan. Results of noninvasive testing reviewed with patient. OK to dc from cardiac standpoint.   Troy Sine, MD, [facility] 03/13/2014 11:28 AM

## 2014-03-18 NOTE — Discharge Summary (Signed)
Physician Discharge Summary  Renuka Farfan Holster WUJ:[id number] DOB: Mar 26, 1929 DOA: 03/10/2014  PCP: Walker Kehr, MD  Admit date: 03/10/2014 Discharge date: 03/18/2014  Time spent: 25  minutes  Recommendations for Outpatient Follow-up:  1. Follow up with PCP in one week.   Discharge Diagnoses:  Active Problems:   Essential hypertension   Left ankle swelling   Chest pain   No blood products   Discharge Condition: improved.   Diet recommendation: low sodium diet.   Filed Weights   03/12/14 0700  Weight: 104.962 kg (231 lb 6.4 oz)    History of present illness:  Mindy Taylor is a 80 y.o. female  Presented with 4 day hx of intermittent chest pain. She was admitted to medical service for further evaluation. Cardiology consulted. ACS ruled out. Nuclear stress done and shows low probability.    Hospital Course:  1. Chest pain: ACS is ruled out. Nuclear steess done and results negative.  CT angio lungs ordered and is neg for PE.  2. Hypertension:  Controlled.    Procedures:  Nuclear stress test.   Consultations:  Cardiology   Discharge Exam: Filed Vitals:   03/13/14 0849  BP: 135/66  Pulse: 71  Temp: 98.7 F (37.1 C)  Resp:     General: alert afebrile comfortable.  Cardiovascular: s1s2 Respiratory: ctab  Discharge Instructions You were cared for by a hospitalist during your hospital stay. If you have any questions about your discharge medications or the care you received while you were in the hospital after you are discharged, you can call the unit and asked to speak with the hospitalist on call if the hospitalist that took care of you is not available. Once you are discharged, your primary care physician will handle any further medical issues. Please note that NO REFILLS for any discharge medications will be authorized once you are discharged, as it is imperative that you return to your primary care physician (or establish a relationship with a primary care  physician if you do not have one) for your aftercare needs so that they can reassess your need for medications and monitor your lab values.  Discharge Instructions   Diet - low sodium heart healthy    Complete by:  As directed      Discharge instructions    Complete by:  As directed   Follow up with PCP in one week.          Discharge Medication List as of 03/13/2014 11:50 AM    START taking these medications   Details  pantoprazole (PROTONIX) 40 MG tablet Take 1 tablet (40 mg total) by mouth daily., Starting 03/13/2014, Until Discontinued, Print      CONTINUE these medications which have NOT CHANGED   Details  amLODipine (NORVASC) 5 MG tablet Take 5 mg by mouth every evening., Until Discontinued, Historical Med    aspirin 81 MG tablet Take 81 mg by mouth every evening. , Until Discontinued, Historical Med    Cholecalciferol (VITAMIN D) 1000 UNITS capsule Take 1 capsule (1,000 Units total) by mouth daily., Starting 01/03/2011, Until Discontinued, No Print    fish oil-omega-3 fatty acids 1000 MG capsule Take 1 g by mouth daily.  , Until Discontinued, Historical Med    losartan (COZAAR) 100 MG tablet Take 100 mg by mouth every evening., Until Discontinued, Historical Med    metoprolol succinate (TOPROL-XL) 50 MG 24 hr tablet Take 1 tablet (50 mg total) by mouth 2 (two) times daily., Starting 11/22/2013, Until Discontinued,  Fax    Multiple Vitamin (MULTIVITAMIN) tablet Take 1 tablet by mouth daily.  , Until Discontinued, Historical Med    pravastatin (PRAVACHOL) 40 MG tablet Take 1 tablet (40 mg total) by mouth daily., Starting 02/01/2014, Until Discontinued, Normal    traMADol (ULTRAM) 50 MG tablet Take 50-100 mg by mouth every 6 (six) hours as needed for moderate pain., Until Discontinued, Historical Med    vitamin C (ASCORBIC ACID) 500 MG tablet Take 500 mg by mouth daily.  , Until Discontinued, Historical Med    vitamin E 400 UNIT capsule Take 400 Units by mouth daily.  , Until  Discontinued, Historical Med       Allergies  Allergen Reactions  . Bee Venom Swelling  . Ceftin [Cefuroxime Axetil]     Felt wierd  . Spider Antivenin [Antivenin Latrodectus Mactans] Other (See Comments)    pain   Follow-up Information   Follow up with Walker Kehr, MD. Schedule an appointment as soon as possible for a visit in 1 week.   Specialty:  Internal Medicine   Contact information:   [place] [address] [phone]        The results of significant diagnostics from this hospitalization (including imaging, microbiology, ancillary and laboratory) are listed below for reference.    Significant Diagnostic Studies: Dg Chest 2 View  03/10/2014   CLINICAL DATA:  Initial evaluation chest pain  EXAM: CHEST  2 VIEW  COMPARISON:  04/01/2013  FINDINGS: Heart size and vascular pattern normal. Aortic ectasia. Lungs clear. No effusions.  IMPRESSION: No active cardiopulmonary disease.   Electronically Signed   By: Skipper Cliche M.D.   On: 03/10/2014 21:31   Ct Angio Chest Pe W/cm &/or Wo Cm  03/12/2014   CLINICAL DATA:  80 year old with anterior chest pain. Positive D-dimer.  EXAM: CT ANGIOGRAPHY CHEST WITH CONTRAST  TECHNIQUE: Multidetector CT imaging of the chest was performed using the standard protocol during bolus administration of intravenous contrast. Multiplanar CT image reconstructions and MIPs were obtained to evaluate the vascular anatomy.  CONTRAST:  93mL OMNIPAQUE IOHEXOL 350 MG/ML SOLN  COMPARISON:  Chest radiograph 03/10/2014  FINDINGS: Negative for pulmonary embolism. There is asymmetric thyroid tissue with an enlarged left thyroid lobe and no significant right thyroid tissue. The left thyroid tissue extends into the substernal region. Normal appearance of the thoracic aorta without dissection or dilatation. There is a very small hiatal hernia. No evidence for chest lymphadenopathy. There are a few coronary artery calcifications. 3.0 cm low-density  structure in the posterior right hepatic lobe is most compatible with a cyst. No acute abnormalities in the upper abdomen.  The trachea and mainstem bronchi are patent. 2 mm nodular density in the left upper lung on sequence 406, image 28. Otherwise, the lungs are clear. No evidence for airspace disease or consolidation. No acute bone abnormality.  Review of the MIP images confirms the above findings.  IMPRESSION: Negative for pulmonary embolism.  No acute chest abnormality.  Punctate nodular density in the left upper lung. If the patient is at high risk for bronchogenic carcinoma, follow-up chest CT at 1 year is recommended. If the patient is at low risk, no follow-up is needed. This recommendation follows the consensus statement: Guidelines for Management of Small Pulmonary Nodules Detected on CT Scans: A Statement from the [place] as published in Radiology 2005; 237:395-400.  Large left thyroid lobe as described.   Electronically Signed   By: Markus Daft M.D.   On:  03/12/2014 13:53   Nm Myocar Multi W/spect W/wall Motion / Ef  03/12/2014   CLINICAL DATA:  Chest pain, hypertension and elevated lipids.  EXAM: MYOCARDIAL IMAGING WITH SPECT (REST AND PHARMACOLOGIC-STRESS)  GATED LEFT VENTRICULAR WALL MOTION STUDY  LEFT VENTRICULAR EJECTION FRACTION  TECHNIQUE: Standard myocardial SPECT imaging was performed after resting intravenous injection of 10 mCi Tc-78m sestamibi. Subsequently, intravenous infusion of Lexiscan was performed under the supervision of the Cardiology staff. At peak effect of the drug, 30 mCi Tc-61m sestamibi was injected intravenously and standard myocardial SPECT imaging was performed. Quantitative gated imaging was also performed to evaluate left ventricular wall motion, and estimate left ventricular ejection fraction.  COMPARISON:  None.  FINDINGS: Perfusion: No decreased activity in the left ventricle on stress imaging to suggest reversible ischemia or infarction.  Wall Motion:  Normal left ventricular wall motion. No left ventricular dilation.  Left Ventricular Ejection Fraction: 69 %  End diastolic volume 82 ml  End systolic volume 26 ml  IMPRESSION: 1. No reversible ischemia or infarction.  2. Normal left ventricular wall motion.  3. Left ventricular ejection fraction 69%  4. Low-risk stress test findings*.  *2012 Appropriate Use Criteria for Coronary Revascularization Focused Update: J Am Coll Cardiol. 7544;92(0):100-712. http://content.[email or web address]?articleid=1201161   Electronically Signed   By: Kerby Moors M.D.   On: 03/12/2014 14:02    Microbiology: No results found for this or any previous visit (from the past 240 hour(s)).   Labs: Basic Metabolic Panel: No results found for this basename: NA, K, CL, CO2, GLUCOSE, BUN, CREATININE, CALCIUM, MG, PHOS,  in the last 168 hours Liver Function Tests: No results found for this basename: AST, ALT, ALKPHOS, BILITOT, PROT, ALBUMIN,  in the last 168 hours No results found for this basename: LIPASE, AMYLASE,  in the last 168 hours No results found for this basename: AMMONIA,  in the last 168 hours CBC: No results found for this basename: WBC, NEUTROABS, HGB, HCT, MCV, PLT,  in the last 168 hours Cardiac Enzymes: No results found for this basename: CKTOTAL, CKMB, CKMBINDEX, TROPONINI,  in the last 168 hours BNP: BNP (last 3 results) No results found for this basename: PROBNP,  in the last 8760 hours CBG: No results found for this basename: GLUCAP,  in the last 168 hours     Signed:  Kesean Serviss  [facility] 03/18/2014, 9:29 PM

## 2014-03-22 ENCOUNTER — Ambulatory Visit (INDEPENDENT_AMBULATORY_CARE_PROVIDER_SITE_OTHER): Payer: Medicare Other | Admitting: Internal Medicine

## 2014-03-22 ENCOUNTER — Encounter: Payer: Self-pay | Admitting: Internal Medicine

## 2014-03-22 VITALS — BP 130/80 | HR 71 | Temp 98.5°F | Wt 242.0 lb

## 2014-03-22 DIAGNOSIS — R079 Chest pain, unspecified: Secondary | ICD-10-CM | POA: Diagnosis not present

## 2014-03-22 DIAGNOSIS — E669 Obesity, unspecified: Secondary | ICD-10-CM | POA: Diagnosis not present

## 2014-03-22 MED ORDER — RANITIDINE HCL 150 MG PO TABS: 150.0000 mg | ORAL_TABLET | Freq: Two times a day (BID) | ORAL | Status: DC

## 2014-03-22 NOTE — Assessment & Plan Note (Signed)
03/12/14 CL IMPRESSION:   1. No reversible ischemia or infarction.  2. Normal left ventricular wall motion.  3. Left ventricular ejection fraction 69%  4. Low-risk stress test findings*.  *2012 Appropriate Use Criteria for Coronary Revascularization  Focused Update: J Am Coll Cardiol. 8416;60(6):301-601.     03/12/14 CT IMPRESSION:  Negative for pulmonary embolism.  No acute chest abnormality.  Punctate nodular density in the left upper lung. If the patient is  at high risk for bronchogenic carcinoma, follow-up chest CT at 1  year is recommended. If the patient is at low risk, no follow-up is  needed. This recommendation follows the consensus statement:  Guidelines for Management of Small Pulmonary Nodules Detected on CT  Scans: A Statement from the [place] as published in  Radiology 2005; 237:395-400.  Large left thyroid lobe as described.  Electronically Signed  By: Markus Daft M.D.  On: 03/12/2014 13:53  [facility] records were reviewed

## 2014-03-22 NOTE — Progress Notes (Signed)
Pre visit review using our clinic review tool, if applicable. No additional management support is needed unless otherwise documented below in the visit note. 

## 2014-03-22 NOTE — Progress Notes (Signed)
Subjective:    HPI   F/u hosp stay -   The patient presents for a follow-up of  chronic hypertension, chronic dyslipidemia.   Admit date: 03/10/2014  Discharge date: 03/18/2014  Time spent: 25 minutes  Recommendations for Outpatient Follow-up:  1. Follow up with PCP in one week.  Discharge Diagnoses:  Active Problems:  Essential hypertension  Left ankle swelling  Chest pain  No blood products  Discharge Condition: improved.  Diet recommendation: low sodium diet.  Filed Weights    03/12/14 0700   Weight:  104.962 kg (231 lb 6.4 oz)   History of present illness:  Mindy Taylor is a 80 y.o. female  Presented with 4 day hx of intermittent chest pain. She was admitted to medical service for further evaluation. Cardiology consulted. ACS ruled out. Nuclear stress done and shows low probability.  Hospital Course:  1. Chest pain: ACS is ruled out. Nuclear steess done and results negative.  CT angio lungs ordered and is neg for PE.  2. Hypertension:  Controlled.  Procedures:  Nuclear stress test.  Consultations:  Cardiology     BP Readings from Last 3 Encounters:  03/22/14 130/80  03/13/14 135/66  02/01/14 144/88     Wt Readings from Last 3 Encounters:  03/22/14 242 lb (109.77 kg)  03/12/14 231 lb 6.4 oz (104.962 kg)  02/01/14 233 lb (105.688 kg)    Review of Systems  Constitutional: Negative for chills, activity change, appetite change, fatigue and unexpected weight change.  HENT: Negative for congestion, mouth sores and sinus pressure.   Eyes: Negative for visual disturbance.  Respiratory: Negative for cough and chest tightness.   Gastrointestinal: Negative for nausea and abdominal pain.  Genitourinary: Negative for frequency, difficulty urinating and vaginal pain.  Musculoskeletal: Positive for back pain (neck pain). Negative for gait problem.  Skin: Negative for pallor and rash.  Neurological: Negative for dizziness, tremors, weakness and headaches.   Psychiatric/Behavioral: Negative for confusion and sleep disturbance.       Objective:   Physical Exam  Constitutional: She appears well-developed and well-nourished. No distress.  Obese   HENT:  Head: Normocephalic.  Right Ear: External ear normal.  Left Ear: External ear normal.  Nose: Nose normal.  Mouth/Throat: Oropharynx is clear and moist.  Eyes: Conjunctivae are normal. Pupils are equal, round, and reactive to light. Right eye exhibits no discharge. Left eye exhibits no discharge.  Neck: Normal range of motion. Neck supple. No JVD present. No tracheal deviation present. No thyromegaly present.  Cardiovascular: Normal rate, regular rhythm and normal heart sounds.   Pulmonary/Chest: No stridor. No respiratory distress. She has no wheezes.  Abdominal: Soft. Bowel sounds are normal. She exhibits no distension and no mass. There is no tenderness. There is no rebound and no guarding.  Musculoskeletal: She exhibits tenderness (B knees are tender). She exhibits no edema.  Lymphadenopathy:    She has no cervical adenopathy.  Neurological: She displays normal reflexes. No cranial nerve deficit. She exhibits normal muscle tone. Coordination normal.  Skin: No rash noted. No erythema.  Psychiatric: She has a normal mood and affect. Her behavior is normal. Judgment and thought content normal.  L knee is not tender L ankle is less swollen and a little tender   Lab Results  Component Value Date   WBC 6.2 03/11/2014   HGB 11.3* 03/11/2014   HCT 34.5* 03/11/2014   PLT 198 03/11/2014   GLUCOSE 97 03/10/2014   CHOL 129 09/06/2012   TRIG  63.0 09/06/2012   HDL 33.90* 09/06/2012   LDLCALC 83 09/06/2012   ALT 12 09/06/2012   AST 14 09/06/2012   NA 141 03/10/2014   K 4.3 03/10/2014   CL 105 03/10/2014   CREATININE 0.78 03/11/2014   BUN 19 03/10/2014   CO2 26 03/10/2014   TSH 1.14 09/06/2012   HGBA1C 5.7 08/11/2011    03/12/14 CL IMPRESSION:   1. No reversible ischemia or infarction.   2. Normal left ventricular wall motion.  3. Left ventricular ejection fraction 69%  4. Low-risk stress test findings*.  *2012 Appropriate Use Criteria for Coronary Revascularization  Focused Update: J Am Coll Cardiol. 5379;43(2):761-470.     03/12/14 CT IMPRESSION:  Negative for pulmonary embolism.  No acute chest abnormality.  Punctate nodular density in the left upper lung. If the patient is  at high risk for bronchogenic carcinoma, follow-up chest CT at 1  year is recommended. If the patient is at low risk, no follow-up is  needed. This recommendation follows the consensus statement:  Guidelines for Management of Small Pulmonary Nodules Detected on CT  Scans: A Statement from the [place] as published in  Radiology 2005; 237:395-400.  Large left thyroid lobe as described.  Electronically Signed  By: Markus Daft M.D.  On: 03/12/2014 13:53        Assessment & Plan:

## 2014-03-22 NOTE — Assessment & Plan Note (Signed)
Discuss

## 2014-05-02 ENCOUNTER — Ambulatory Visit: Payer: Medicare Other | Admitting: Internal Medicine

## 2014-05-05 ENCOUNTER — Encounter: Payer: Self-pay | Admitting: Internal Medicine

## 2014-05-05 ENCOUNTER — Other Ambulatory Visit: Payer: Self-pay | Admitting: Internal Medicine

## 2014-05-05 ENCOUNTER — Ambulatory Visit (INDEPENDENT_AMBULATORY_CARE_PROVIDER_SITE_OTHER): Payer: Medicare Other | Admitting: Internal Medicine

## 2014-05-05 VITALS — BP 110/70 | HR 77 | Temp 98.2°F | Wt 232.0 lb

## 2014-05-05 DIAGNOSIS — F411 Generalized anxiety disorder: Secondary | ICD-10-CM | POA: Diagnosis not present

## 2014-05-05 DIAGNOSIS — E669 Obesity, unspecified: Secondary | ICD-10-CM

## 2014-05-05 DIAGNOSIS — I10 Essential (primary) hypertension: Secondary | ICD-10-CM

## 2014-05-05 DIAGNOSIS — E785 Hyperlipidemia, unspecified: Secondary | ICD-10-CM

## 2014-05-05 DIAGNOSIS — M153 Secondary multiple arthritis: Secondary | ICD-10-CM | POA: Diagnosis not present

## 2014-05-05 NOTE — Progress Notes (Signed)
Pre visit review using our clinic review tool, if applicable. No additional management support is needed unless otherwise documented below in the visit note. 

## 2014-05-05 NOTE — Assessment & Plan Note (Signed)
Continue with current prescription therapy as reflected on the Med list.  

## 2014-05-05 NOTE — Assessment & Plan Note (Signed)
Wt Readings from Last 3 Encounters:  05/05/14 232 lb (105.235 kg)  03/22/14 242 lb (109.77 kg)  03/12/14 231 lb 6.4 oz (104.962 kg)

## 2014-05-05 NOTE — Progress Notes (Signed)
Subjective:    HPI  The patient presents for a follow-up of  chronic hypertension, chronic dyslipidemia, OA controlled with medicines    BP Readings from Last 3 Encounters:  05/05/14 110/70  03/22/14 130/80  03/13/14 135/66     Wt Readings from Last 3 Encounters:  05/05/14 232 lb (105.235 kg)  03/22/14 242 lb (109.77 kg)  03/12/14 231 lb 6.4 oz (104.962 kg)    Review of Systems  Constitutional: Negative for chills, activity change, appetite change, fatigue and unexpected weight change.  HENT: Negative for congestion, mouth sores and sinus pressure.   Eyes: Negative for visual disturbance.  Respiratory: Negative for cough and chest tightness.   Gastrointestinal: Negative for nausea and abdominal pain.  Genitourinary: Negative for frequency, difficulty urinating and vaginal pain.  Musculoskeletal: Positive for back pain (neck pain). Negative for gait problem.  Skin: Negative for pallor and rash.  Neurological: Negative for dizziness, tremors, weakness and headaches.  Psychiatric/Behavioral: Negative for confusion and sleep disturbance.       Objective:   Physical Exam  Constitutional: She appears well-developed. No distress.  HENT:  Head: Normocephalic.  Right Ear: External ear normal.  Left Ear: External ear normal.  Nose: Nose normal.  Mouth/Throat: Oropharynx is clear and moist.  Eyes: Conjunctivae are normal. Pupils are equal, round, and reactive to light. Right eye exhibits no discharge. Left eye exhibits no discharge.  Neck: Normal range of motion. Neck supple. No JVD present. No tracheal deviation present. No thyromegaly present.  Cardiovascular: Normal rate, regular rhythm and normal heart sounds.   Pulmonary/Chest: No stridor. No respiratory distress. She has no wheezes.  Abdominal: Soft. Bowel sounds are normal. She exhibits no distension and no mass. There is no tenderness. There is no rebound and no guarding.  Musculoskeletal: She exhibits no edema or  tenderness.  Lymphadenopathy:    She has no cervical adenopathy.  Neurological: She displays normal reflexes. No cranial nerve deficit. She exhibits normal muscle tone. Coordination normal.  Skin: No rash noted. No erythema.  Psychiatric: She has a normal mood and affect. Her behavior is normal. Judgment and thought content normal.  L knee is not tender L ankle is less swollen and a little tender   Lab Results  Component Value Date   WBC 6.2 03/11/2014   HGB 11.3* 03/11/2014   HCT 34.5* 03/11/2014   PLT 198 03/11/2014   GLUCOSE 97 03/10/2014   CHOL 129 09/06/2012   TRIG 63.0 09/06/2012   HDL 33.90* 09/06/2012   LDLCALC 83 09/06/2012   ALT 12 09/06/2012   AST 14 09/06/2012   NA 141 03/10/2014   K 4.3 03/10/2014   CL 105 03/10/2014   CREATININE 0.78 03/11/2014   BUN 19 03/10/2014   CO2 26 03/10/2014   TSH 1.14 09/06/2012   HGBA1C 5.7 08/11/2011    03/12/14 CL IMPRESSION:   1. No reversible ischemia or infarction.  2. Normal left ventricular wall motion.  3. Left ventricular ejection fraction 69%  4. Low-risk stress test findings*.  *2012 Appropriate Use Criteria for Coronary Revascularization  Focused Update: J Am Coll Cardiol. 4854;62(7):035-009.     03/12/14 CT IMPRESSION:  Negative for pulmonary embolism.  No acute chest abnormality.  Punctate nodular density in the left upper lung. If the patient is  at high risk for bronchogenic carcinoma, follow-up chest CT at 1  year is recommended. If the patient is at low risk, no follow-up is  needed. This recommendation follows the consensus statement:  Guidelines  for Management of Small Pulmonary Nodules Detected on CT  Scans: A Statement from the [place] as published in  Radiology 2005; 237:395-400.  Large left thyroid lobe as described.  Electronically Signed  By: Markus Daft M.D.  On: 03/12/2014 13:53        Assessment & Plan:  Patient ID: Mindy Taylor, female   DOB: [birth year], 80 y.o.   MRN:  [id number]

## 2014-05-05 NOTE — Assessment & Plan Note (Signed)
Doing well 

## 2014-05-05 NOTE — Patient Instructions (Addendum)
Wt Readings from Last 3 Encounters:  05/05/14 232 lb (105.235 kg)  03/22/14 242 lb (109.77 kg)  03/12/14 231 lb 6.4 oz (104.962 kg)   Low carb diet  BP Readings from Last 3 Encounters:  05/05/14 110/70  03/22/14 130/80  03/13/14 135/66

## 2014-05-05 NOTE — Assessment & Plan Note (Signed)
Continue with current prescription therapy as reflected on the Med list. Better 

## 2014-05-08 ENCOUNTER — Ambulatory Visit: Payer: Medicare Other

## 2014-05-08 ENCOUNTER — Telehealth: Payer: Self-pay | Admitting: Internal Medicine

## 2014-05-08 NOTE — Telephone Encounter (Signed)
emmi mailed  °

## 2014-06-21 DIAGNOSIS — M170 Bilateral primary osteoarthritis of knee: Secondary | ICD-10-CM | POA: Diagnosis not present

## 2014-08-11 ENCOUNTER — Ambulatory Visit (INDEPENDENT_AMBULATORY_CARE_PROVIDER_SITE_OTHER): Payer: Medicare Other | Admitting: Internal Medicine

## 2014-08-11 VITALS — BP 140/74 | HR 94 | Temp 98.5°F | Resp 16 | Ht 67.0 in | Wt 237.0 lb

## 2014-08-11 DIAGNOSIS — R739 Hyperglycemia, unspecified: Secondary | ICD-10-CM

## 2014-08-11 DIAGNOSIS — I639 Cerebral infarction, unspecified: Secondary | ICD-10-CM | POA: Diagnosis not present

## 2014-08-11 DIAGNOSIS — M25561 Pain in right knee: Secondary | ICD-10-CM | POA: Diagnosis not present

## 2014-08-11 DIAGNOSIS — M25562 Pain in left knee: Secondary | ICD-10-CM | POA: Diagnosis not present

## 2014-08-11 DIAGNOSIS — I10 Essential (primary) hypertension: Secondary | ICD-10-CM

## 2014-08-11 DIAGNOSIS — I6350 Cerebral infarction due to unspecified occlusion or stenosis of unspecified cerebral artery: Secondary | ICD-10-CM

## 2014-08-11 MED ORDER — IBUPROFEN 600 MG PO TABS: 600.0000 mg | ORAL_TABLET | Freq: Two times a day (BID) | ORAL | Status: DC | PRN

## 2014-08-11 NOTE — Assessment & Plan Note (Addendum)
Metoprolol, Amlodipine, Losartan 

## 2014-08-11 NOTE — Progress Notes (Signed)
Subjective:    HPI  The patient presents for a follow-up of  chronic hypertension, chronic dyslipidemia, OA controlled with medicines    BP Readings from Last 3 Encounters:  08/11/14 140/74  05/05/14 110/70  03/22/14 130/80     Wt Readings from Last 3 Encounters:  08/11/14 237 lb (107.502 kg)  05/05/14 232 lb (105.235 kg)  03/22/14 242 lb (109.77 kg)    Review of Systems  Constitutional: Negative for chills, activity change, appetite change, fatigue and unexpected weight change.  HENT: Negative for congestion, mouth sores and sinus pressure.   Eyes: Negative for visual disturbance.  Respiratory: Negative for cough and chest tightness.   Gastrointestinal: Negative for nausea and abdominal pain.  Genitourinary: Negative for frequency, difficulty urinating and vaginal pain.  Musculoskeletal: Positive for back pain (neck pain). Negative for gait problem.  Skin: Negative for pallor and rash.  Neurological: Negative for dizziness, tremors, weakness and headaches.  Psychiatric/Behavioral: Negative for confusion and sleep disturbance.       Objective:   Physical Exam  Constitutional: She appears well-developed. No distress.  HENT:  Head: Normocephalic.  Right Ear: External ear normal.  Left Ear: External ear normal.  Nose: Nose normal.  Mouth/Throat: Oropharynx is clear and moist.  Eyes: Conjunctivae are normal. Pupils are equal, round, and reactive to light. Right eye exhibits no discharge. Left eye exhibits no discharge.  Neck: Normal range of motion. Neck supple. No JVD present. No tracheal deviation present. No thyromegaly present.  Cardiovascular: Normal rate, regular rhythm and normal heart sounds.   Pulmonary/Chest: No stridor. No respiratory distress. She has no wheezes.  Abdominal: Soft. Bowel sounds are normal. She exhibits no distension and no mass. There is no tenderness. There is no rebound and no guarding.  Musculoskeletal: She exhibits no edema or  tenderness.  Lymphadenopathy:    She has no cervical adenopathy.  Neurological: She displays normal reflexes. No cranial nerve deficit. She exhibits normal muscle tone. Coordination normal.  Skin: No rash noted. No erythema.  Psychiatric: She has a normal mood and affect. Her behavior is normal. Judgment and thought content normal.  L knee is not tender L ankle is less swollen and a little tender   Lab Results  Component Value Date   WBC 6.2 03/11/2014   HGB 11.3* 03/11/2014   HCT 34.5* 03/11/2014   PLT 198 03/11/2014   GLUCOSE 97 03/10/2014   CHOL 129 09/06/2012   TRIG 63.0 09/06/2012   HDL 33.90* 09/06/2012   LDLCALC 83 09/06/2012   ALT 12 09/06/2012   AST 14 09/06/2012   NA 141 03/10/2014   K 4.3 03/10/2014   CL 105 03/10/2014   CREATININE 0.78 03/11/2014   BUN 19 03/10/2014   CO2 26 03/10/2014   TSH 1.14 09/06/2012   HGBA1C 5.7 08/11/2011    03/12/14 CL IMPRESSION:   1. No reversible ischemia or infarction.  2. Normal left ventricular wall motion.  3. Left ventricular ejection fraction 69%  4. Low-risk stress test findings*.  *2012 Appropriate Use Criteria for Coronary Revascularization  Focused Update: J Am Coll Cardiol. 3354;56(2):563-893.     03/12/14 CT IMPRESSION:  Negative for pulmonary embolism.  No acute chest abnormality.  Punctate nodular density in the left upper lung. If the patient is  at high risk for bronchogenic carcinoma, follow-up chest CT at 1  year is recommended. If the patient is at low risk, no follow-up is  needed. This recommendation follows the consensus statement:  Guidelines for Management  of Small Pulmonary Nodules Detected on CT  Scans: A Statement from the [place] as published in  Radiology 2005; 237:395-400.  Large left thyroid lobe as described.  Electronically Signed  By: Markus Daft M.D.  On: 03/12/2014 13:53        Assessment & Plan:

## 2014-08-11 NOTE — Patient Instructions (Signed)
Aspercreme for knees

## 2014-08-11 NOTE — Assessment & Plan Note (Signed)
BMET 

## 2014-08-11 NOTE — Assessment & Plan Note (Signed)
Metoprolol, Amlodipine, Losartan, ASA, Pravachol

## 2014-08-11 NOTE — Assessment & Plan Note (Signed)
Injection offered Ibuprofen prn Aspercreme

## 2014-08-11 NOTE — Progress Notes (Signed)
Pre visit review using our clinic review tool, if applicable. No additional management support is needed unless otherwise documented below in the visit note. 

## 2014-10-09 ENCOUNTER — Other Ambulatory Visit: Payer: Self-pay | Admitting: Internal Medicine

## 2014-10-19 DIAGNOSIS — H04121 Dry eye syndrome of right lacrimal gland: Secondary | ICD-10-CM | POA: Diagnosis not present

## 2014-10-19 DIAGNOSIS — H16223 Keratoconjunctivitis sicca, not specified as Sjogren's, bilateral: Secondary | ICD-10-CM | POA: Diagnosis not present

## 2014-10-19 DIAGNOSIS — H5203 Hypermetropia, bilateral: Secondary | ICD-10-CM | POA: Diagnosis not present

## 2014-10-19 DIAGNOSIS — H2513 Age-related nuclear cataract, bilateral: Secondary | ICD-10-CM | POA: Diagnosis not present

## 2014-11-18 ENCOUNTER — Emergency Department (HOSPITAL_BASED_OUTPATIENT_CLINIC_OR_DEPARTMENT_OTHER)
Admission: EM | Admit: 2014-11-18 | Discharge: 2014-11-18 | Disposition: A | Discharge status: Home / Self Care | Payer: Medicare Other | Attending: Emergency Medicine | Admitting: Emergency Medicine

## 2014-11-18 ENCOUNTER — Encounter (HOSPITAL_BASED_OUTPATIENT_CLINIC_OR_DEPARTMENT_OTHER): Payer: Self-pay | Admitting: *Deleted

## 2014-11-18 DIAGNOSIS — M779 Enthesopathy, unspecified: Secondary | ICD-10-CM | POA: Diagnosis not present

## 2014-11-18 DIAGNOSIS — Z7982 Long term (current) use of aspirin: Secondary | ICD-10-CM | POA: Insufficient documentation

## 2014-11-18 DIAGNOSIS — E785 Hyperlipidemia, unspecified: Secondary | ICD-10-CM | POA: Insufficient documentation

## 2014-11-18 DIAGNOSIS — E669 Obesity, unspecified: Secondary | ICD-10-CM | POA: Diagnosis not present

## 2014-11-18 DIAGNOSIS — R2232 Localized swelling, mass and lump, left upper limb: Secondary | ICD-10-CM | POA: Diagnosis present

## 2014-11-18 DIAGNOSIS — Z8619 Personal history of other infectious and parasitic diseases: Secondary | ICD-10-CM | POA: Diagnosis not present

## 2014-11-18 DIAGNOSIS — Z8709 Personal history of other diseases of the respiratory system: Secondary | ICD-10-CM | POA: Insufficient documentation

## 2014-11-18 DIAGNOSIS — Z8669 Personal history of other diseases of the nervous system and sense organs: Secondary | ICD-10-CM | POA: Insufficient documentation

## 2014-11-18 DIAGNOSIS — Z87828 Personal history of other (healed) physical injury and trauma: Secondary | ICD-10-CM | POA: Insufficient documentation

## 2014-11-18 DIAGNOSIS — Z87891 Personal history of nicotine dependence: Secondary | ICD-10-CM | POA: Diagnosis not present

## 2014-11-18 DIAGNOSIS — F419 Anxiety disorder, unspecified: Secondary | ICD-10-CM | POA: Diagnosis not present

## 2014-11-18 DIAGNOSIS — M1990 Unspecified osteoarthritis, unspecified site: Secondary | ICD-10-CM | POA: Diagnosis not present

## 2014-11-18 DIAGNOSIS — Z79899 Other long term (current) drug therapy: Secondary | ICD-10-CM | POA: Diagnosis not present

## 2014-11-18 DIAGNOSIS — I10 Essential (primary) hypertension: Secondary | ICD-10-CM | POA: Insufficient documentation

## 2014-11-18 MED ORDER — TRAMADOL HCL 50 MG PO TABS
50.0000 mg | ORAL_TABLET | Freq: Once | ORAL | Status: AC
  Administered 2014-11-18: 50 mg via ORAL
  Filled 2014-11-18: qty 1

## 2014-11-18 MED ORDER — NAPROXEN 250 MG PO TABS
500.0000 mg | ORAL_TABLET | Freq: Once | ORAL | Status: AC
  Administered 2014-11-18: 500 mg via ORAL
  Filled 2014-11-18: qty 2

## 2014-11-18 MED ORDER — MELOXICAM 15 MG PO TABS: 15.0000 mg | ORAL_TABLET | Freq: Every day | ORAL | Status: DC

## 2014-11-18 NOTE — ED Provider Notes (Signed)
CSN: [id number]     Arrival date & time 11/18/14  2132 History  This chart was scribed for Jefferson Fullam, MD by Sima Matas, ED Scribe. This patient was seen in room MH12/MH12 and the patient's care was started at 11:09 PM.  Chief Complaint  Patient presents with  . Arm Swelling   Patient is a 81 y.o. female presenting with arm injury. The history is provided by the patient. No language interpreter was used.  Arm Injury Location:  Elbow Time since incident:  1 day Upper extremity injury: cannot recall although a new bruise is present.   Elbow location:  L elbow Pain details:    Quality:  Aching (swelling)   Radiates to:  Does not radiate   Severity:  Moderate   Onset quality:  Gradual   Duration:  1 day   Timing:  Constant   Progression:  Unchanged Chronicity:  New Dislocation: no   Foreign body present:  No foreign bodies Prior injury to area:  No Relieved by:  Nothing Worsened by:  Movement Ineffective treatments:  NSAIDs Associated symptoms: no back pain, no decreased range of motion, no fatigue, no fever, no muscle weakness, no neck pain, no numbness, no stiffness, no swelling and no tingling    HPI Comments: Mindy Taylor is a 81 y.o. female with a PMHx of hypertension, osteoarthritis, cellulitis/tendonitis in right arm, who presents to the Emergency Department complaining of left elbow pain. She states she took Advil at [facility] with some relief.   She also has ecchymosis on left arm.  Past Medical History  Diagnosis Date  . ANXIETY 03/05/2007  . ATAXIA 12/26/2009  . BEE STING 03/05/2007  . BRONCHITIS, ACUTE 06/28/2010  . CANDIDIASIS, VAGINAL 09/08/2008  . CEREBROVASCULAR ACCIDENT, HX OF 01/04/2010  . CERUMEN IMPACTION 06/05/2008  . DIZZINESS 12/26/2009  . Epistaxis 06/05/2008  . Headache(784.0) 06/05/2008  . HYPERLIPIDEMIA 12/08/2006  . HYPERTENSION 12/08/2006  . 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 NECK PAIN 06/05/2008  . OBESITY 09/07/2009  . ONYCHOMYCOSIS 03/09/2009  .  OSTEOARTHRITIS 12/08/2006  . PARESTHESIA 12/26/2009  . TOBACCO USE, QUIT 03/09/2009   Past Surgical History  Procedure Laterality Date  . Breast surgery    . Abdominal hysterectomy     Family History  Problem Relation Age of Onset  . Hypertension Other   . Hypertension Mother   . Hypertension Father    History  Substance Use Topics  . Smoking status: Former Research scientist (life sciences)  . Smokeless tobacco: Not on file  . Alcohol Use: No   OB History    No data available     Review of Systems  Constitutional: Negative for fever and fatigue.  Musculoskeletal: Positive for arthralgias. Negative for back pain, joint swelling, stiffness and neck pain.       Swelling to left arm  Skin: Negative for color change and rash.  Neurological: Negative for weakness and numbness.  All other systems reviewed and are negative.  Allergies  Bee venom; Ceftin; and Spider antivenin  Home Medications   Prior to Admission medications   Medication Sig Start Date End Date Taking? Authorizing Provider  amLODipine (NORVASC) 5 MG tablet TAKE 1 TABLET DAILY 05/05/14   Lew Dawes V, MD  aspirin 81 MG tablet Take 81 mg by mouth every evening.     Historical Provider, MD  Cholecalciferol (VITAMIN D) 1000 UNITS capsule Take 1 capsule (1,000 Units total) by mouth daily. 01/03/11   Aleksei Plotnikov V, MD  fish oil-omega-3 fatty acids 1000 MG  capsule Take 1 g by mouth daily.      Historical Provider, MD  ibuprofen (ADVIL,MOTRIN) 600 MG tablet Take 1 tablet (600 mg total) by mouth 2 (two) times daily as needed for moderate pain. 08/11/14   Aleksei Plotnikov V, MD  losartan (COZAAR) 100 MG tablet Take 100 mg by mouth every evening.    Historical Provider, MD  metoprolol succinate (TOPROL-XL) 50 MG 24 hr tablet TAKE 1 TABLET TWICE A DAY 10/09/14   Aleksei Plotnikov V, MD  Multiple Vitamin (MULTIVITAMIN) tablet Take 1 tablet by mouth daily.      Historical Provider, MD  pravastatin (PRAVACHOL) 40 MG tablet Take 1 tablet (40 mg  total) by mouth daily. 02/01/14   Aleksei Plotnikov V, MD  ranitidine (ZANTAC) 150 MG tablet Take 1 tablet (150 mg total) by mouth 2 (two) times daily. 03/22/14   Aleksei Plotnikov V, MD  vitamin C (ASCORBIC ACID) 500 MG tablet Take 500 mg by mouth daily.      Historical Provider, MD   Triage Vitals: BP 181/81 mmHg  Pulse 93  Temp(Src) 98.4 F (36.9 C) (Oral)  Resp 24  Ht 5\' 7"  (1.702 m)  Wt 242 lb 9 oz (110.026 kg)  BMI 37.98 kg/m2  SpO2 98%  Physical Exam  Constitutional: She is oriented to person, place, and time. She appears well-developed and well-nourished.  HENT:  Head: Normocephalic and atraumatic.  Mouth/Throat: Oropharynx is clear and moist. No oropharyngeal exudate.  Eyes: EOM are normal. Pupils are equal, round, and reactive to light.  Neck: Normal range of motion. Neck supple.  Cardiovascular: Normal rate, regular rhythm, normal heart sounds and intact distal pulses.   Pulmonary/Chest: Effort normal and breath sounds normal. No respiratory distress. She has no wheezes. She has no rales.  Abdominal: Soft. Bowel sounds are normal. There is no tenderness. There is no rebound and no guarding.  Musculoskeletal: Normal range of motion. She exhibits tenderness. She exhibits no edema.       Left shoulder: Normal. She exhibits no swelling, no effusion, no pain and no spasm.       Left elbow: She exhibits normal range of motion, no swelling, no effusion, no deformity and no laceration. No tenderness found. No radial head, no medial epicondyle, no lateral epicondyle and no olecranon process tenderness noted.       Left wrist: Normal.       Left hand: Normal. She exhibits normal range of motion and normal capillary refill. Normal sensation noted. Normal strength noted.  3+ radial pulses Cap refill <2seconds No fluctuance no warmth noted to left arm. No swelling. Dime sized ecchymosis. Full ROM at elbow Pronation/Supination in tact. 5/5 strength DTRs normal.  Neurological: She is  alert and oriented to person, place, and time. She has normal reflexes.  Skin: Skin is warm and dry. No rash noted. No erythema. No pallor.  Psychiatric: She has a normal mood and affect. Her behavior is normal.  Nursing note and vitals reviewed.  ED Course  Procedures (including critical care time)  DIAGNOSTIC STUDIES: Oxygen Saturation is 98% on RA, normal by my interpretation.    COORDINATION OF CARE: 11:15 PM- Discussed plans to give patient tramadol and naproxen. Advised patient to ice and elevate left arm. Pt advised of plan for treatment and pt agrees.  Labs Review Labs Reviewed - No data to display  Imaging Review No results found.   EKG Interpretation None     MDM   Final diagnoses:  None  NSAIDs ice and elevation.  Close follow up[ with your PMD and sports medicine I personally performed the services described in this documentation, which was scribed in my presence. The recorded information has been reviewed and is accurate.    Veatrice Kells, MD 11/19/14 [id number]

## 2014-11-18 NOTE — ED Notes (Addendum)
H/o cellulitis in R arm. Here today for L arm swelling, redness heat and pain. Pinpoints pain to L hand/ wrist to elbow, primarily L elbow, describes as "throbbing". Temporary relief with advil, last advil at 1800. CMS/ ROM intact.

## 2014-11-19 ENCOUNTER — Encounter (HOSPITAL_BASED_OUTPATIENT_CLINIC_OR_DEPARTMENT_OTHER): Payer: Self-pay | Admitting: Emergency Medicine

## 2014-11-22 ENCOUNTER — Other Ambulatory Visit (INDEPENDENT_AMBULATORY_CARE_PROVIDER_SITE_OTHER): Payer: Medicare Other

## 2014-11-22 ENCOUNTER — Ambulatory Visit (INDEPENDENT_AMBULATORY_CARE_PROVIDER_SITE_OTHER): Payer: Medicare Other | Admitting: Internal Medicine

## 2014-11-22 ENCOUNTER — Encounter: Payer: Self-pay | Admitting: Internal Medicine

## 2014-11-22 VITALS — BP 146/92 | HR 97 | Wt 235.0 lb

## 2014-11-22 DIAGNOSIS — I10 Essential (primary) hypertension: Secondary | ICD-10-CM

## 2014-11-22 DIAGNOSIS — I639 Cerebral infarction, unspecified: Secondary | ICD-10-CM

## 2014-11-22 DIAGNOSIS — M25561 Pain in right knee: Secondary | ICD-10-CM

## 2014-11-22 DIAGNOSIS — M79602 Pain in left arm: Secondary | ICD-10-CM

## 2014-11-22 LAB — CBC WITH DIFFERENTIAL/PLATELET
BASOS PCT: 0.4 % (ref 0.0–3.0)
Basophils Absolute: 0 10*3/uL (ref 0.0–0.1)
EOS ABS: 0.1 10*3/uL (ref 0.0–0.7)
Eosinophils Relative: 0.6 % (ref 0.0–5.0)
HEMATOCRIT: 36.3 % (ref 36.0–46.0)
Hemoglobin: 12 g/dL (ref 12.0–15.0)
LYMPHS ABS: 2.4 10*3/uL (ref 0.7–4.0)
Lymphocytes Relative: 24.6 % (ref 12.0–46.0)
MCHC: 33.2 g/dL (ref 30.0–36.0)
MCV: 91.9 fl (ref 78.0–100.0)
MONO ABS: 0.9 10*3/uL (ref 0.1–1.0)
Monocytes Relative: 9.4 % (ref 3.0–12.0)
NEUTROS ABS: 6.3 10*3/uL (ref 1.4–7.7)
NEUTROS PCT: 65 % (ref 43.0–77.0)
Platelets: 235 10*3/uL (ref 150.0–400.0)
RBC: 3.95 Mil/uL (ref 3.87–5.11)
RDW: 13.9 % (ref 11.5–15.5)
WBC: 9.7 10*3/uL (ref 4.0–10.5)

## 2014-11-22 LAB — BASIC METABOLIC PANEL
BUN: 12 mg/dL (ref 6–23)
CALCIUM: 9.8 mg/dL (ref 8.4–10.5)
CO2: 30 mEq/L (ref 19–32)
Chloride: 102 mEq/L (ref 96–112)
Creatinine, Ser: 0.93 mg/dL (ref 0.40–1.20)
GFR: 73.78 mL/min (ref >60.00)
GLUCOSE: 114 mg/dL — AB (ref 70–99)
Potassium: 3.8 mEq/L (ref 3.5–5.1)
SODIUM: 140 meq/L (ref 135–145)

## 2014-11-22 MED ORDER — DOXYCYCLINE HYCLATE 100 MG PO TABS: 100.0000 mg | ORAL_TABLET | Freq: Two times a day (BID) | ORAL | Status: DC

## 2014-11-22 MED ORDER — LOSARTAN POTASSIUM 100 MG PO TABS: 100.0000 mg | ORAL_TABLET | Freq: Every evening | ORAL | Status: DC

## 2014-11-22 NOTE — Assessment & Plan Note (Signed)
6/16 poss cellulitis Doxy x 7 d Labs

## 2014-11-22 NOTE — Assessment & Plan Note (Signed)
OA Ibuprofen prn

## 2014-11-22 NOTE — Progress Notes (Signed)
Pre visit review using our clinic review tool, if applicable. No additional management support is needed unless otherwise documented below in the visit note. 

## 2014-11-22 NOTE — Progress Notes (Signed)
Subjective:    Arm Pain  The incident occurred 3 to 5 days ago. There was no injury mechanism. The pain is present in the left elbow and left forearm. The pain is at a severity of 9/10. The pain is severe. The pain has been constant since the incident. She has tried ice and acetaminophen for the symptoms. The treatment provided mild relief.  Since Sat. It feels like "skin infection" she had before...  The patient presents for a follow-up of  chronic hypertension, chronic dyslipidemia, OA controlled with medicines  BP 146/92 mmHg  Pulse 97  Wt 235 lb (106.595 kg)  SpO2 98%   BP Readings from Last 3 Encounters:  11/22/14 146/92  11/18/14 117/63  08/11/14 140/74     Wt Readings from Last 3 Encounters:  11/22/14 235 lb (106.595 kg)  11/18/14 242 lb 9 oz (110.026 kg)  08/11/14 237 lb (107.502 kg)    Review of Systems  Constitutional: Negative for chills, activity change, appetite change, fatigue and unexpected weight change.  HENT: Negative for congestion, mouth sores and sinus pressure.   Eyes: Negative for visual disturbance.  Respiratory: Negative for cough and chest tightness.   Gastrointestinal: Negative for nausea and abdominal pain.  Genitourinary: Negative for frequency, difficulty urinating and vaginal pain.  Musculoskeletal: Positive for back pain (neck pain). Negative for gait problem.  Skin: Negative for pallor and rash.  Neurological: Negative for dizziness, tremors, weakness and headaches.  Psychiatric/Behavioral: Negative for confusion and sleep disturbance.       Objective:   Physical Exam  Constitutional: She appears well-developed. No distress.  HENT:  Head: Normocephalic.  Right Ear: External ear normal.  Left Ear: External ear normal.  Nose: Nose normal.  Mouth/Throat: Oropharynx is clear and moist.  Eyes: Conjunctivae are normal. Pupils are equal, round, and reactive to light. Right eye exhibits no discharge. Left eye exhibits no discharge.   Neck: Normal range of motion. Neck supple. No JVD present. No tracheal deviation present. No thyromegaly present.  Cardiovascular: Normal rate, regular rhythm and normal heart sounds.   Pulmonary/Chest: No stridor. No respiratory distress. She has no wheezes.  Abdominal: Soft. Bowel sounds are normal. She exhibits no distension and no mass. There is no tenderness. There is no rebound and no guarding.  Musculoskeletal: She exhibits no edema or tenderness.  Lymphadenopathy:    She has no cervical adenopathy.  Neurological: She displays normal reflexes. No cranial nerve deficit. She exhibits normal muscle tone. Coordination normal.  Skin: No rash noted. No erythema.  Psychiatric: She has a normal mood and affect. Her behavior is normal. Judgment and thought content normal.  L elbow and prox forearm is tender, warm w/mild erythema    Lab Results  Component Value Date   WBC 6.2 03/11/2014   HGB 11.3* 03/11/2014   HCT 34.5* 03/11/2014   PLT 198 03/11/2014   GLUCOSE 97 03/10/2014   CHOL 129 09/06/2012   TRIG 63.0 09/06/2012   HDL 33.90* 09/06/2012   LDLCALC 83 09/06/2012   ALT 12 09/06/2012   AST 14 09/06/2012   NA 141 03/10/2014   K 4.3 03/10/2014   CL 105 03/10/2014   CREATININE 0.78 03/11/2014   BUN 19 03/10/2014   CO2 26 03/10/2014   TSH 1.14 09/06/2012   HGBA1C 5.7 08/11/2011    03/12/14 CL IMPRESSION:   1. No reversible ischemia or infarction.  2. Normal left ventricular wall motion.  3. Left ventricular ejection fraction 69%  4. Low-risk stress test  findings*.  *2012 Appropriate Use Criteria for Coronary Revascularization  Focused Update: J Am Coll Cardiol. 9030;09(2):330-076.     03/12/14 CT IMPRESSION:  Negative for pulmonary embolism.  No acute chest abnormality.  Punctate nodular density in the left upper lung. If the patient is  at high risk for bronchogenic carcinoma, follow-up chest CT at 1  year is recommended. If the patient is at low risk, no follow-up  is  needed. This recommendation follows the consensus statement:  Guidelines for Management of Small Pulmonary Nodules Detected on CT  Scans: A Statement from the [place] as published in  Radiology 2005; 237:395-400.  Large left thyroid lobe as described.  Electronically Signed  By: Markus Daft M.D.  On: 03/12/2014 13:53        Assessment & Plan:

## 2014-11-22 NOTE — Assessment & Plan Note (Signed)
Chronic. Sub optimal control. Metoprolol, Amlodipine, Losartan

## 2014-11-28 ENCOUNTER — Telehealth: Payer: Self-pay | Admitting: Internal Medicine

## 2014-11-28 MED ORDER — METHOCARBAMOL 500 MG PO TABS
500.0000 mg | ORAL_TABLET | Freq: Three times a day (TID) | ORAL | Status: DC | PRN
Start: 2014-11-28 — End: 2014-11-28

## 2014-11-28 MED ORDER — METHOCARBAMOL 500 MG PO TABS: 500.0000 mg | ORAL_TABLET | Freq: Three times a day (TID) | ORAL | Status: DC | PRN

## 2014-11-28 NOTE — Telephone Encounter (Signed)
Pharmacy for this prescription needs to be [facility] [address] in [place].

## 2014-11-28 NOTE — Telephone Encounter (Signed)
Patient is having neck spasms where she can't turn her neck. She is requesting Dr. Alain Marion prescribe her methocarbamol (ROBAXIN) 500 MG tablet [[id number] for it. She has brought it up to him before about how it worked really well. Pharmacy is [facility] [address]

## 2014-11-28 NOTE — Telephone Encounter (Signed)
Rx sent. See meds. Left detailed mess informing pt.

## 2014-11-28 NOTE — Telephone Encounter (Signed)
Ok pls call in Thx 

## 2014-12-01 ENCOUNTER — Other Ambulatory Visit (INDEPENDENT_AMBULATORY_CARE_PROVIDER_SITE_OTHER): Payer: Medicare Other

## 2014-12-01 ENCOUNTER — Ambulatory Visit (INDEPENDENT_AMBULATORY_CARE_PROVIDER_SITE_OTHER): Payer: Medicare Other | Admitting: Internal Medicine

## 2014-12-01 ENCOUNTER — Encounter: Payer: Self-pay | Admitting: Internal Medicine

## 2014-12-01 VITALS — BP 144/70 | HR 86 | Temp 98.6°F | Wt 232.0 lb

## 2014-12-01 DIAGNOSIS — M25531 Pain in right wrist: Secondary | ICD-10-CM

## 2014-12-01 DIAGNOSIS — I639 Cerebral infarction, unspecified: Secondary | ICD-10-CM

## 2014-12-01 LAB — URIC ACID: Uric Acid, Serum: 4.2 mg/dL (ref 2.4–7.0)

## 2014-12-01 LAB — BASIC METABOLIC PANEL
BUN: 19 mg/dL (ref 6–23)
CALCIUM: 9.6 mg/dL (ref 8.4–10.5)
CO2: 26 meq/L (ref 19–32)
Chloride: 104 mEq/L (ref 96–112)
Creatinine, Ser: 0.93 mg/dL (ref 0.40–1.20)
GFR: 73.78 mL/min (ref >60.00)
Glucose, Bld: 104 mg/dL — ABNORMAL HIGH (ref 70–99)
POTASSIUM: 3.5 meq/L (ref 3.5–5.1)
SODIUM: 139 meq/L (ref 135–145)

## 2014-12-01 LAB — RHEUMATOID FACTOR: Rhuematoid fact SerPl-aCnc: 11 IU/mL (ref <=14)

## 2014-12-01 LAB — SEDIMENTATION RATE: Sed Rate: 114 mm/hr — ABNORMAL HIGH (ref 0–22)

## 2014-12-01 MED ORDER — MELOXICAM 7.5 MG PO TABS: 7.5000 mg | ORAL_TABLET | Freq: Every day | ORAL | Status: DC

## 2014-12-01 MED ORDER — DOXYCYCLINE HYCLATE 100 MG PO TABS: 100.0000 mg | ORAL_TABLET | Freq: Two times a day (BID) | ORAL | Status: DC

## 2014-12-01 MED ORDER — LEVOFLOXACIN 250 MG PO TABS: 250.0000 mg | ORAL_TABLET | Freq: Every day | ORAL | Status: DC

## 2014-12-01 NOTE — Progress Notes (Signed)
Pre visit review using our clinic review tool, if applicable. No additional management support is needed unless otherwise documented below in the visit note. 

## 2014-12-01 NOTE — Assessment & Plan Note (Addendum)
Recurrent - likely inflammatory arthritis  Pt thinks it is cellulitis - like what she had before RF, ESR, ANA Meloxicam ACE Empiric Doxy

## 2014-12-01 NOTE — Progress Notes (Signed)
Subjective:  Patient ID: Mindy Taylor, female    DOB: 1928/09/11  Age: 81 y.o. MRN: [id number]  CC: Hand Pain   HPI Mindy Taylor presents for R wrist pain and swelling x 3-4 days. Pt thinks it is cellulitis - like what she had before. C/o grease burn R wrist happened later  Outpatient Prescriptions Prior to Visit  Medication Sig Dispense Refill  . amLODipine (NORVASC) 5 MG tablet TAKE 1 TABLET DAILY 90 tablet 2  . aspirin 81 MG tablet Take 81 mg by mouth every evening.     . Cholecalciferol (VITAMIN D) 1000 UNITS capsule Take 1 capsule (1,000 Units total) by mouth daily. 100 capsule 3  . fish oil-omega-3 fatty acids 1000 MG capsule Take 1 g by mouth daily.      [name] losartan (COZAAR) 100 MG tablet Take 1 tablet (100 mg total) by mouth every evening. 90 tablet 3  . methocarbamol (ROBAXIN) 500 MG tablet Take 1 tablet (500 mg total) by mouth every 8 (eight) hours as needed for muscle spasms. 30 tablet 1  . metoprolol succinate (TOPROL-XL) 50 MG 24 hr tablet TAKE 1 TABLET TWICE A DAY 180 tablet 3  . Multiple Vitamin (MULTIVITAMIN) tablet Take 1 tablet by mouth daily.      . pravastatin (PRAVACHOL) 40 MG tablet Take 1 tablet (40 mg total) by mouth daily. 90 tablet 3  . ranitidine (ZANTAC) 150 MG tablet Take 1 tablet (150 mg total) by mouth 2 (two) times daily. 60 tablet 11  . vitamin C (ASCORBIC ACID) 500 MG tablet Take 500 mg by mouth daily.      [name] doxycycline (VIBRA-TABS) 100 MG tablet Take 1 tablet (100 mg total) by mouth 2 (two) times daily. 14 tablet 0  . ibuprofen (ADVIL,MOTRIN) 600 MG tablet Take 1 tablet (600 mg total) by mouth 2 (two) times daily as needed for moderate pain. 60 tablet 2   No facility-administered medications prior to visit.    ROS Review of Systems  Constitutional: Positive for chills. Negative for fever, activity change, appetite change, fatigue and unexpected weight change.  HENT: Negative for congestion, mouth sores and sinus pressure.   Eyes: Negative for  visual disturbance.  Respiratory: Negative for cough and chest tightness.   Gastrointestinal: Negative for nausea and abdominal pain.  Genitourinary: Negative for frequency, difficulty urinating and vaginal pain.  Musculoskeletal: Positive for arthralgias. Negative for myalgias, back pain and gait problem.  Skin: Negative for pallor and rash.  Neurological: Negative for dizziness, tremors, weakness, numbness and headaches.  Psychiatric/Behavioral: Negative for confusion and sleep disturbance.    Objective:  BP 144/70 mmHg  Pulse 86  Temp(Src) 98.6 F (37 C) (Oral)  Wt 232 lb (105.235 kg)  SpO2 98%  BP Readings from Last 3 Encounters:  12/01/14 144/70  11/22/14 146/92  11/18/14 117/63    Wt Readings from Last 3 Encounters:  12/01/14 232 lb (105.235 kg)  11/22/14 235 lb (106.595 kg)  11/18/14 242 lb 9 oz (110.026 kg)    Physical Exam  Constitutional: She appears well-developed. No distress.  HENT:  Head: Normocephalic.  Right Ear: External ear normal.  Left Ear: External ear normal.  Nose: Nose normal.  Mouth/Throat: Oropharynx is clear and moist.  Eyes: Conjunctivae are normal. Pupils are equal, round, and reactive to light. Right eye exhibits no discharge. Left eye exhibits no discharge.  Neck: Normal range of motion. Neck supple. No JVD present. No tracheal deviation present. No thyromegaly present.  Cardiovascular: Normal  rate, regular rhythm and normal heart sounds.   Pulmonary/Chest: No stridor. No respiratory distress. She has no wheezes.  Abdominal: Soft. Bowel sounds are normal. She exhibits no distension and no mass. There is no tenderness. There is no rebound and no guarding.  Musculoskeletal: She exhibits no edema or tenderness.  Lymphadenopathy:    She has no cervical adenopathy.  Neurological: She displays normal reflexes. No cranial nerve deficit. She exhibits normal muscle tone. Coordination normal.  Skin: No rash noted. There is erythema.  Psychiatric:  She has a normal mood and affect. Her behavior is normal. Judgment and thought content normal.  R forearm w/1 cm erosion R wrist is swollen and tender  Lab Results  Component Value Date   WBC 9.7 11/22/2014   HGB 12.0 11/22/2014   HCT 36.3 11/22/2014   PLT 235.0 11/22/2014   GLUCOSE 104* 12/01/2014   CHOL 129 09/06/2012   TRIG 63.0 09/06/2012   HDL 33.90* 09/06/2012   LDLCALC 83 09/06/2012   ALT 12 09/06/2012   AST 14 09/06/2012   NA 139 12/01/2014   K 3.5 12/01/2014   CL 104 12/01/2014   CREATININE 0.93 12/01/2014   BUN 19 12/01/2014   CO2 26 12/01/2014   TSH 1.14 09/06/2012   HGBA1C 5.7 08/11/2011    No results found.  Assessment & Plan:   Mindy Taylor was seen today for hand pain.  Diagnoses and all orders for this visit:  Right wrist pain Orders: -     Basic metabolic panel; Future -     Sedimentation rate; Future -     Rheumatoid factor; Future -     ANA; Future -     Uric acid; Future  Other orders -     Discontinue: doxycycline (VIBRA-TABS) 100 MG tablet; Take 1 tablet (100 mg total) by mouth 2 (two) times daily. -     meloxicam (MOBIC) 7.5 MG tablet; Take 1 tablet (7.5 mg total) by mouth daily. -     levofloxacin (LEVAQUIN) 250 MG tablet; Take 1 tablet (250 mg total) by mouth daily.  I have discontinued Mindy Taylor's ibuprofen, doxycycline, and doxycycline. I am also having her start on meloxicam and levofloxacin. Additionally, I am having her maintain her aspirin, Vitamin D, multivitamin, vitamin C, fish oil-omega-3 fatty acids, pravastatin, ranitidine, amLODipine, metoprolol succinate, losartan, and methocarbamol.  Meds ordered this encounter  Medications  . DISCONTD: doxycycline (VIBRA-TABS) 100 MG tablet    Sig: Take 1 tablet (100 mg total) by mouth 2 (two) times daily.    Dispense:  14 tablet    Refill:  1  . meloxicam (MOBIC) 7.5 MG tablet    Sig: Take 1 tablet (7.5 mg total) by mouth daily.    Dispense:  30 tablet    Refill:  1  . levofloxacin  (LEVAQUIN) 250 MG tablet    Sig: Take 1 tablet (250 mg total) by mouth daily.    Dispense:  7 tablet    Refill:  1    Disregard doxy rx please     Follow-up: Return in about 2 weeks (around 12/15/2014) for a follow-up visit.  Walker Kehr, MD

## 2014-12-04 LAB — ANA: Anti Nuclear Antibody(ANA): NEGATIVE

## 2014-12-06 ENCOUNTER — Ambulatory Visit: Payer: Medicare Other | Admitting: Internal Medicine

## 2014-12-14 ENCOUNTER — Encounter: Payer: Self-pay | Admitting: Internal Medicine

## 2014-12-14 ENCOUNTER — Encounter (HOSPITAL_COMMUNITY): Payer: Self-pay | Admitting: *Deleted

## 2014-12-14 ENCOUNTER — Emergency Department (HOSPITAL_COMMUNITY)
Admission: EM | Admit: 2014-12-14 | Discharge: 2014-12-14 | Disposition: A | Discharge status: Home / Self Care | Payer: Medicare Other | Attending: Emergency Medicine | Admitting: Emergency Medicine

## 2014-12-14 ENCOUNTER — Ambulatory Visit (INDEPENDENT_AMBULATORY_CARE_PROVIDER_SITE_OTHER): Payer: Medicare Other | Admitting: Internal Medicine

## 2014-12-14 VITALS — BP 128/76 | HR 74 | Temp 98.6°F | Ht 67.0 in | Wt 235.2 lb

## 2014-12-14 DIAGNOSIS — I639 Cerebral infarction, unspecified: Secondary | ICD-10-CM | POA: Diagnosis not present

## 2014-12-14 DIAGNOSIS — I10 Essential (primary) hypertension: Secondary | ICD-10-CM | POA: Diagnosis not present

## 2014-12-14 DIAGNOSIS — M2550 Pain in unspecified joint: Secondary | ICD-10-CM

## 2014-12-14 DIAGNOSIS — Z79899 Other long term (current) drug therapy: Secondary | ICD-10-CM | POA: Diagnosis not present

## 2014-12-14 DIAGNOSIS — M159 Polyosteoarthritis, unspecified: Secondary | ICD-10-CM

## 2014-12-14 DIAGNOSIS — Z8673 Personal history of transient ischemic attack (TIA), and cerebral infarction without residual deficits: Secondary | ICD-10-CM | POA: Diagnosis not present

## 2014-12-14 DIAGNOSIS — Z8619 Personal history of other infectious and parasitic diseases: Secondary | ICD-10-CM | POA: Diagnosis not present

## 2014-12-14 DIAGNOSIS — Z8659 Personal history of other mental and behavioral disorders: Secondary | ICD-10-CM | POA: Insufficient documentation

## 2014-12-14 DIAGNOSIS — E669 Obesity, unspecified: Secondary | ICD-10-CM | POA: Insufficient documentation

## 2014-12-14 DIAGNOSIS — M150 Primary generalized (osteo)arthritis: Secondary | ICD-10-CM | POA: Diagnosis not present

## 2014-12-14 DIAGNOSIS — Z7982 Long term (current) use of aspirin: Secondary | ICD-10-CM | POA: Diagnosis not present

## 2014-12-14 DIAGNOSIS — M25531 Pain in right wrist: Secondary | ICD-10-CM

## 2014-12-14 DIAGNOSIS — Z87891 Personal history of nicotine dependence: Secondary | ICD-10-CM | POA: Insufficient documentation

## 2014-12-14 NOTE — Discharge Instructions (Signed)
You do not have cellulitis.  Arthralgia Your caregiver has diagnosed you as suffering from an arthralgia. Arthralgia means there is pain in a joint. This can come from many reasons including:  Bruising the joint which causes soreness (inflammation) in the joint.  Wear and tear on the joints which occur as we grow older (osteoarthritis).  Overusing the joint.  Various forms of arthritis.  Infections of the joint. Regardless of the cause of pain in your joint, most of these different pains respond to anti-inflammatory drugs and rest. The exception to this is when a joint is infected, and these cases are treated with antibiotics, if it is a bacterial infection. HOME CARE INSTRUCTIONS   Rest the injured area for as long as directed by your caregiver. Then slowly start using the joint as directed by your caregiver and as the pain allows. Crutches as directed may be useful if the ankles, knees or hips are involved. If the knee was splinted or casted, continue use and care as directed. If an stretchy or elastic wrapping bandage has been applied today, it should be removed and re-applied every 3 to 4 hours. It should not be applied tightly, but firmly enough to keep swelling down. Watch toes and feet for swelling, bluish discoloration, coldness, numbness or excessive pain. If any of these problems (symptoms) occur, remove the ace bandage and re-apply more loosely. If these symptoms persist, contact your caregiver or return to this location.  For the first 24 hours, keep the injured extremity elevated on pillows while lying down.  Apply ice for 15-20 minutes to the sore joint every couple hours while awake for the first half day. Then 03-04 times per day for the first 48 hours. Put the ice in a plastic bag and place a towel between the bag of ice and your skin.  Wear any splinting, casting, elastic bandage applications, or slings as instructed.  Only take over-the-counter or prescription medicines  for pain, discomfort, or fever as directed by your caregiver. Do not use aspirin immediately after the injury unless instructed by your physician. Aspirin can cause increased bleeding and bruising of the tissues.  If you were given crutches, continue to use them as instructed and do not resume weight bearing on the sore joint until instructed. Persistent pain and inability to use the sore joint as directed for more than 2 to 3 days are warning signs indicating that you should see a caregiver for a follow-up visit as soon as possible. Initially, a hairline fracture (break in bone) may not be evident on X-rays. Persistent pain and swelling indicate that further evaluation, non-weight bearing or use of the joint (use of crutches or slings as instructed), or further X-rays are indicated. X-rays may sometimes not show a small fracture until a week or 10 days later. Make a follow-up appointment with your own caregiver or one to whom we have referred you. A radiologist (specialist in reading X-rays) may read your X-rays. Make sure you know how you are to obtain your X-ray results. Do not assume everything is normal if you do not hear from [place]. SEEK MEDICAL CARE IF: Bruising, swelling, or pain increases. SEEK IMMEDIATE MEDICAL CARE IF:   Your fingers or toes are numb or blue.  The pain is not responding to medications and continues to stay the same or get worse.  The pain in your joint becomes severe.  You develop a fever over 102 F (38.9 C).  It becomes impossible to move or use  the joint. MAKE SURE YOU:   Understand these instructions.  Will watch your condition.  Will get help right away if you are not doing well or get worse. Document Released: 05/12/2005 Document Revised: 08/04/2011 Document Reviewed: 12/29/2007 [facility] Patient Information 2015 [place], [place]. This information is not intended to replace advice given to you by your health care provider. Make sure you discuss any questions you  have with your health care provider.

## 2014-12-14 NOTE — Progress Notes (Addendum)
Subjective:  Patient ID: Mindy Taylor, female    DOB: 1929/01/26  Age: 81 y.o. MRN: [id number]  CC: No chief complaint on file.   HPI    Mindy Taylor presents for R wrist pain and swelling. Pt thinks it is cellulitis again - like what she had before.  Her ESR was high. Pt wants to have a strong abx. Pt took Advil this am  Outpatient Prescriptions Prior to Visit  Medication Sig Dispense Refill  . amLODipine (NORVASC) 5 MG tablet TAKE 1 TABLET DAILY 90 tablet 2  . aspirin 81 MG tablet Take 81 mg by mouth every evening.     . Cholecalciferol (VITAMIN D) 1000 UNITS capsule Take 1 capsule (1,000 Units total) by mouth daily. 100 capsule 3  . fish oil-omega-3 fatty acids 1000 MG capsule Take 1 g by mouth daily.      [name] losartan (COZAAR) 100 MG tablet Take 1 tablet (100 mg total) by mouth every evening. 90 tablet 3  . meloxicam (MOBIC) 7.5 MG tablet Take 1 tablet (7.5 mg total) by mouth daily. 30 tablet 1  . methocarbamol (ROBAXIN) 500 MG tablet Take 1 tablet (500 mg total) by mouth every 8 (eight) hours as needed for muscle spasms. 30 tablet 1  . metoprolol succinate (TOPROL-XL) 50 MG 24 hr tablet TAKE 1 TABLET TWICE A DAY 180 tablet 3  . Multiple Vitamin (MULTIVITAMIN) tablet Take 1 tablet by mouth daily.      . pravastatin (PRAVACHOL) 40 MG tablet Take 1 tablet (40 mg total) by mouth daily. 90 tablet 3  . vitamin C (ASCORBIC ACID) 500 MG tablet Take 500 mg by mouth daily.      [name] levofloxacin (LEVAQUIN) 250 MG tablet Take 1 tablet (250 mg total) by mouth daily. 7 tablet 1  . ranitidine (ZANTAC) 150 MG tablet Take 1 tablet (150 mg total) by mouth 2 (two) times daily. 60 tablet 11   No facility-administered medications prior to visit.    ROS Review of Systems  Constitutional: Negative for chills, activity change, appetite change, fatigue and unexpected weight change.  HENT: Negative for congestion, mouth sores and sinus pressure.   Eyes: Negative for visual disturbance.  Respiratory:  Negative for cough and chest tightness.   Gastrointestinal: Negative for nausea and abdominal pain.  Genitourinary: Negative for frequency, difficulty urinating and vaginal pain.  Musculoskeletal: Positive for arthralgias. Negative for back pain and gait problem.  Skin: Negative for pallor and rash.  Neurological: Negative for dizziness, tremors, weakness, numbness and headaches.  Psychiatric/Behavioral: Negative for confusion and sleep disturbance.    Objective:  BP 128/76 mmHg  Pulse 74  Temp(Src) 98.6 F (37 C) (Oral)  Ht $R'5\' 7"'hg$  (1.702 m)  Wt 235 lb 4 oz (106.709 kg)  BMI 36.84 kg/m2  SpO2 98%  BP Readings from Last 3 Encounters:  12/14/14 128/76  12/01/14 144/70  11/22/14 146/92    Wt Readings from Last 3 Encounters:  12/14/14 235 lb 4 oz (106.709 kg)  12/01/14 232 lb (105.235 kg)  11/22/14 235 lb (106.595 kg)    Physical Exam  Constitutional: She appears well-developed. No distress.  HENT:  Head: Normocephalic.  Right Ear: External ear normal.  Left Ear: External ear normal.  Nose: Nose normal.  Mouth/Throat: Oropharynx is clear and moist.  Eyes: Conjunctivae are normal. Pupils are equal, round, and reactive to light. Right eye exhibits no discharge. Left eye exhibits no discharge.  Neck: Normal range of motion. Neck supple. No JVD  present. No tracheal deviation present. No thyromegaly present.  Cardiovascular: Normal rate, regular rhythm and normal heart sounds.   Pulmonary/Chest: No stridor. No respiratory distress. She has no wheezes.  Abdominal: Soft. Bowel sounds are normal. She exhibits no distension and no mass. There is no tenderness. There is no rebound and no guarding.  Musculoskeletal: She exhibits edema and tenderness.  Lymphadenopathy:    She has no cervical adenopathy.  Neurological: She displays normal reflexes. No cranial nerve deficit. She exhibits normal muscle tone. Coordination normal.  Skin: No rash noted. No erythema.  Psychiatric: She has a  normal mood and affect. Her behavior is normal. Judgment and thought content normal.  R wrist is not tender. It is a little puffy - no erythema B ankles w/ trace edema; feet are sensitive to palp  Lab Results  Component Value Date   WBC 9.7 11/22/2014   HGB 12.0 11/22/2014   HCT 36.3 11/22/2014   PLT 235.0 11/22/2014   GLUCOSE 104* 12/01/2014   CHOL 129 09/06/2012   TRIG 63.0 09/06/2012   HDL 33.90* 09/06/2012   LDLCALC 83 09/06/2012   ALT 12 09/06/2012   AST 14 09/06/2012   NA 139 12/01/2014   K 3.5 12/01/2014   CL 104 12/01/2014   CREATININE 0.93 12/01/2014   BUN 19 12/01/2014   CO2 26 12/01/2014   TSH 1.14 09/06/2012   HGBA1C 5.7 08/11/2011   Discussion FTF >20 min No results found.  Assessment & Plan:     Follow-up: Return in about 6 weeks (around 01/25/2015) for a follow-up visit.  Walker Kehr, MD

## 2014-12-14 NOTE — ED Provider Notes (Signed)
CSN: [id number]     Arrival date & time 12/14/14  0907 History   First MD Initiated Contact with Patient 12/14/14 0914     Chief Complaint  Patient presents with  . Hand Pain     (Consider location/radiation/quality/duration/timing/severity/associated sxs/prior Treatment) HPI Comments: Patient presents to the ER stating that she has cellulitis and needs an antibiotic. She has seen her doctor and another ER and has been told that she has either arthritis or tendinitis. Patient reports that all the other doctors have been wrong, she knows that she has cellulitis and needs an antibiotic. Patient reports pain that moves around to different areas of the wrist and hand on the right side. She denies any direct injury. There is no redness, swelling, drainage. She has not had a fever.  Patient is a 81 y.o. female presenting with hand pain.  Hand Pain    Past Medical History  Diagnosis Date  . ANXIETY 03/05/2007  . ATAXIA 12/26/2009  . BEE STING 03/05/2007  . BRONCHITIS, ACUTE 06/28/2010  . CANDIDIASIS, VAGINAL 09/08/2008  . CEREBROVASCULAR ACCIDENT, HX OF 01/04/2010  . CERUMEN IMPACTION 06/05/2008  . DIZZINESS 12/26/2009  . Epistaxis 06/05/2008  . Headache(784.0) 06/05/2008  . HYPERLIPIDEMIA 12/08/2006  . HYPERTENSION 12/08/2006  . 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 NECK PAIN 06/05/2008  . OBESITY 09/07/2009  . ONYCHOMYCOSIS 03/09/2009  . OSTEOARTHRITIS 12/08/2006  . PARESTHESIA 12/26/2009  . TOBACCO USE, QUIT 03/09/2009   Past Surgical History  Procedure Laterality Date  . Breast surgery    . Abdominal hysterectomy     Family History  Problem Relation Age of Onset  . Hypertension Other   . Hypertension Mother   . Hypertension Father    History  Substance Use Topics  . Smoking status: Former Research scientist (life sciences)  . Smokeless tobacco: Not on file  . Alcohol Use: No   OB History    No data available     Review of Systems  Musculoskeletal: Positive for arthralgias.  All other systems reviewed and are  negative.     Allergies  Bee venom; Ceftin; and Spider antivenin  Home Medications   Prior to Admission medications   Medication Sig Start Date End Date Taking? Authorizing Provider  amLODipine (NORVASC) 5 MG tablet TAKE 1 TABLET DAILY 05/05/14   Lew Dawes V, MD  aspirin 81 MG tablet Take 81 mg by mouth every evening.     Historical Provider, MD  Cholecalciferol (VITAMIN D) 1000 UNITS capsule Take 1 capsule (1,000 Units total) by mouth daily. 01/03/11   Aleksei Plotnikov V, MD  fish oil-omega-3 fatty acids 1000 MG capsule Take 1 g by mouth daily.      Historical Provider, MD  losartan (COZAAR) 100 MG tablet Take 1 tablet (100 mg total) by mouth every evening. 11/22/14   Aleksei Plotnikov V, MD  meloxicam (MOBIC) 7.5 MG tablet Take 1 tablet (7.5 mg total) by mouth daily. 12/01/14   Aleksei Plotnikov V, MD  methocarbamol (ROBAXIN) 500 MG tablet Take 1 tablet (500 mg total) by mouth every 8 (eight) hours as needed for muscle spasms. 11/28/14   Aleksei Plotnikov V, MD  metoprolol succinate (TOPROL-XL) 50 MG 24 hr tablet TAKE 1 TABLET TWICE A DAY 10/09/14   Aleksei Plotnikov V, MD  Multiple Vitamin (MULTIVITAMIN) tablet Take 1 tablet by mouth daily.      Historical Provider, MD  pravastatin (PRAVACHOL) 40 MG tablet Take 1 tablet (40 mg total) by mouth daily. 02/01/14   Cassandria Anger, MD  vitamin C (ASCORBIC ACID) 500 MG tablet Take 500 mg by mouth daily.      Historical Provider, MD   There were no vitals taken for this visit. Physical Exam  Constitutional: She is oriented to person, place, and time. She appears well-developed and well-nourished. No distress.  HENT:  Head: Normocephalic and atraumatic.  Right Ear: Hearing normal.  Left Ear: Hearing normal.  Nose: Nose normal.  Mouth/Throat: Oropharynx is clear and moist and mucous membranes are normal.  Eyes: Conjunctivae and EOM are normal. Pupils are equal, round, and reactive to light.  Neck: Normal range of motion. Neck supple.   Cardiovascular: Regular rhythm, S1 normal and S2 normal.  Exam reveals no gallop and no friction rub.   No murmur heard. Pulmonary/Chest: Effort normal and breath sounds normal. No respiratory distress. She exhibits no tenderness.  Abdominal: Soft. Normal appearance and bowel sounds are normal. There is no hepatosplenomegaly. There is no tenderness. There is no rebound, no guarding, no tenderness at McBurney's point and negative Murphy's sign. No hernia.  Musculoskeletal: Normal range of motion.  Neurological: She is alert and oriented to person, place, and time. She has normal strength. No cranial nerve deficit or sensory deficit. Coordination normal. GCS eye subscore is 4. GCS verbal subscore is 5. GCS motor subscore is 6.  Skin: Skin is warm, dry and intact. No rash noted. No cyanosis.  Psychiatric: She has a normal mood and affect. Her speech is normal and behavior is normal. Thought content normal.  Nursing note and vitals reviewed.   ED Course  Procedures (including critical care time) Labs Review Labs Reviewed - No data to display  Imaging Review No results found.   EKG Interpretation None      MDM   Final diagnoses:  None   arthralgia  Patient presents to the emergency department demanding an antibiotic for a cellulitis. Patient reports that she has had this before and had to see multiple doctors before someone would prescribe her an antibiotic. She is doing this again, has been seeing her doctor and multiple ERs in an attempt to get antibiotic's prescribed. Examination of the wrist is unremarkable. She has no swelling, no effusion, no overlying erythema or warmth. There are no overlying skin changes or wounds. There is nothing to suggest septic arthritis or cellulitis. I attempted to explain this to the patient, but she told me that she "knows what this is and needs an antibiotic". I told her that she would not get an antibiotic in the emergency department, the appropriate  course of action would be for her to make another appointment with her doctor and allow him to reexamine the wrist and decide if she needs any further interventions or treatment.   Orpah Greek, MD 12/14/14 [phone]

## 2014-12-14 NOTE — Assessment & Plan Note (Addendum)
Recurrent - likely inflammatory arthritis - ?seronegative RA. There are no active process in the wrist that I could appreciate. X ray offered. Pt thinks it is cellulitis - like what she had before. She finished abx.  Pt said she would like to go to ER now because "there is something there that will do me harm". "I need a stronger antibiotic!"  I offered ID or Rhematology consultation referral. Pt said she will go to [facility] ER from here now. Pt was not receptive to my reasoning today.  2014: Clinical Data: Right hand pain and swelling  RIGHT HAND - COMPLETE 3+ VIEW  Comparison: None.  Findings: Three views of the right hand submitted. No acute fracture or subluxation. Mild narrowing of radiocarpal joint space. Mild degenerative changes first carpal metacarpal joint. Mild degenerative changes distal interphalangeal joint first and second finger. Mild degenerative changes proximal interphalangeal joint second and fifth finger.  IMPRESSION:  No acute fracture or subluxation. Degenerative changes as described above.   Original Report Authenticated By: Lahoma Crocker, M.D.

## 2014-12-14 NOTE — Progress Notes (Signed)
Pre visit review using our clinic review tool, if applicable. No additional management support is needed unless otherwise documented below in the visit note. 

## 2014-12-14 NOTE — ED Notes (Signed)
Pt reports right wrist pain, hx of cellulitis and states she "needs an antibiotic".  Pt went to MD office this morning and was told it was arthritis or tendonitis.  MD at bedside.  No swelling or redness noted.

## 2014-12-14 NOTE — Assessment & Plan Note (Addendum)
Chronic OA vs inflammatory arthritis Ibuprofen prn See discussion above

## 2014-12-15 ENCOUNTER — Telehealth: Payer: Self-pay | Admitting: Internal Medicine

## 2014-12-15 ENCOUNTER — Ambulatory Visit: Payer: Medicare Other | Admitting: Internal Medicine

## 2014-12-15 DIAGNOSIS — M1990 Unspecified osteoarthritis, unspecified site: Secondary | ICD-10-CM

## 2014-12-15 NOTE — Telephone Encounter (Signed)
Come for labs on Mon fasting Thx

## 2014-12-15 NOTE — Telephone Encounter (Signed)
Please advise, thanks.

## 2014-12-15 NOTE — Telephone Encounter (Signed)
Patient was wanting to get some more lab work done to see if she has improved from her last labs. She doesn't want to come in for an appointment. I saw she went to the ER yesterday and told her she should come in for a follow up but she said they didn't help her any. Please advise

## 2014-12-15 NOTE — Telephone Encounter (Signed)
Advised patient of dr plotnikovs note---patient will come for fasting labs on monday

## 2014-12-18 ENCOUNTER — Other Ambulatory Visit (INDEPENDENT_AMBULATORY_CARE_PROVIDER_SITE_OTHER): Payer: Medicare Other

## 2014-12-18 DIAGNOSIS — M129 Arthropathy, unspecified: Secondary | ICD-10-CM | POA: Diagnosis not present

## 2014-12-18 DIAGNOSIS — M1990 Unspecified osteoarthritis, unspecified site: Secondary | ICD-10-CM

## 2014-12-18 LAB — SEDIMENTATION RATE: Sed Rate: 95 mm/hr — ABNORMAL HIGH (ref 0–22)

## 2014-12-18 LAB — C-REACTIVE PROTEIN: CRP: 0.2 mg/dL — ABNORMAL LOW (ref 0.5–20.0)

## 2014-12-19 LAB — ANTI-NUCLEAR AB-TITER (ANA TITER)

## 2014-12-19 LAB — ANA: Anti Nuclear Antibody(ANA): POSITIVE — AB

## 2014-12-19 LAB — CYCLIC CITRUL PEPTIDE ANTIBODY, IGG: Cyclic Citrullin Peptide Ab: <2 U/mL (ref 0.0–5.0)

## 2014-12-20 ENCOUNTER — Telehealth: Payer: Self-pay | Admitting: Internal Medicine

## 2014-12-20 DIAGNOSIS — M1990 Unspecified osteoarthritis, unspecified site: Secondary | ICD-10-CM

## 2014-12-20 LAB — PROTEIN ELECTROPHORESIS, SERUM
ABNORMAL PROTEIN BAND1: 1.5 g/dL
Albumin ELP: 3.6 g/dL — ABNORMAL LOW (ref 3.8–4.8)
Alpha-1-Globulin: 0.4 g/dL — ABNORMAL HIGH (ref 0.2–0.3)
Alpha-2-Globulin: 1 g/dL — ABNORMAL HIGH (ref 0.5–0.9)
BETA 2: 0.3 g/dL (ref 0.2–0.5)
BETA GLOBULIN: 0.5 g/dL (ref 0.4–0.6)
GAMMA GLOBULIN: 1.8 g/dL — AB (ref 0.8–1.7)
Total Protein, Serum Electrophoresis: 7.5 g/dL (ref 6.1–8.1)

## 2014-12-20 NOTE — Telephone Encounter (Signed)
Pt informed of below. Please enter referral.   Notes Recorded by Cresenciano Lick, CMA on 12/20/2014 at 11:31 AM Left mess for patient to call back. Copies mailed to pt. Notes Recorded by Cassandria Anger, MD on 12/20/2014 at 7:51 AM Mindy Taylor, please, inform patient that her lupus tests are positive. I would suggest a Rheumatology consultation. Infection tests are negative. Please, mail the labs to the patient.

## 2014-12-20 NOTE — Telephone Encounter (Signed)
Please call patient at [phone].  She returned your call

## 2014-12-20 NOTE — Telephone Encounter (Signed)
Ok Thx 

## 2015-01-03 DIAGNOSIS — M2550 Pain in unspecified joint: Secondary | ICD-10-CM | POA: Diagnosis not present

## 2015-01-03 DIAGNOSIS — R700 Elevated erythrocyte sedimentation rate: Secondary | ICD-10-CM | POA: Diagnosis not present

## 2015-01-03 DIAGNOSIS — R5383 Other fatigue: Secondary | ICD-10-CM | POA: Diagnosis not present

## 2015-01-03 DIAGNOSIS — R600 Localized edema: Secondary | ICD-10-CM | POA: Diagnosis not present

## 2015-01-03 DIAGNOSIS — R768 Other specified abnormal immunological findings in serum: Secondary | ICD-10-CM | POA: Diagnosis not present

## 2015-01-05 ENCOUNTER — Telehealth: Payer: Self-pay | Admitting: Internal Medicine

## 2015-01-05 NOTE — Telephone Encounter (Signed)
Received records from [place] forwarded 8 pages to Dr.A.Plotnikov 01/05/15 fbg.

## 2015-01-11 ENCOUNTER — Other Ambulatory Visit: Payer: Self-pay | Admitting: Internal Medicine

## 2015-01-19 ENCOUNTER — Other Ambulatory Visit: Payer: Self-pay | Admitting: Internal Medicine

## 2015-02-01 ENCOUNTER — Ambulatory Visit (INDEPENDENT_AMBULATORY_CARE_PROVIDER_SITE_OTHER): Payer: Medicare Other | Admitting: Internal Medicine

## 2015-02-01 ENCOUNTER — Encounter: Payer: Self-pay | Admitting: Internal Medicine

## 2015-02-01 VITALS — BP 139/85 | HR 82 | Temp 98.7°F | Wt 230.0 lb

## 2015-02-01 DIAGNOSIS — I639 Cerebral infarction, unspecified: Secondary | ICD-10-CM | POA: Diagnosis not present

## 2015-02-01 DIAGNOSIS — Z23 Encounter for immunization: Secondary | ICD-10-CM

## 2015-02-01 DIAGNOSIS — M2550 Pain in unspecified joint: Secondary | ICD-10-CM | POA: Insufficient documentation

## 2015-02-01 DIAGNOSIS — M25531 Pain in right wrist: Secondary | ICD-10-CM

## 2015-02-01 DIAGNOSIS — I10 Essential (primary) hypertension: Secondary | ICD-10-CM

## 2015-02-01 MED ORDER — IBUPROFEN 200 MG PO TABS: 200.0000 mg | ORAL_TABLET | Freq: Three times a day (TID) | ORAL | Status: DC | PRN

## 2015-02-01 NOTE — Assessment & Plan Note (Signed)
Metoprolol, Amlodipine, Losartan 

## 2015-02-01 NOTE — Addendum Note (Signed)
Addended by: Cresenciano Lick on: 02/01/2015 03:02 PM   Modules accepted: Orders

## 2015-02-01 NOTE — Progress Notes (Signed)
Subjective:  Patient ID: Mindy Taylor, female    DOB: December 16, 1928  Age: 81 y.o. MRN: [id number]  CC: No chief complaint on file.   HPI Breelle L Ritthaler presents for inflammatory arthritis, HTN, TIA f/u. Dr Trudie Reed ref pt to a "blood doctor" - pt has an appt w/Dr Marin Olp. Pt is taking Advil prn.  Outpatient Prescriptions Prior to Visit  Medication Sig Dispense Refill  . amLODipine (NORVASC) 5 MG tablet TAKE 1 TABLET DAILY 90 tablet 3  . aspirin 81 MG tablet Take 81 mg by mouth every evening.     . Cholecalciferol (VITAMIN D) 1000 UNITS capsule Take 1 capsule (1,000 Units total) by mouth daily. 100 capsule 3  . fish oil-omega-3 fatty acids 1000 MG capsule Take 1 g by mouth daily.      [name] losartan (COZAAR) 100 MG tablet Take 1 tablet (100 mg total) by mouth every evening. 90 tablet 3  . methocarbamol (ROBAXIN) 500 MG tablet Take 1 tablet (500 mg total) by mouth every 8 (eight) hours as needed for muscle spasms. 30 tablet 1  . metoprolol succinate (TOPROL-XL) 50 MG 24 hr tablet TAKE 1 TABLET TWICE A DAY 180 tablet 3  . Multiple Vitamin (MULTIVITAMIN) tablet Take 1 tablet by mouth daily.      . pravastatin (PRAVACHOL) 40 MG tablet TAKE 1 TABLET DAILY 90 tablet 2  . vitamin C (ASCORBIC ACID) 500 MG tablet Take 500 mg by mouth daily.      . meloxicam (MOBIC) 7.5 MG tablet Take 1 tablet (7.5 mg total) by mouth daily. (Patient not taking: Reported on 02/01/2015) 30 tablet 1   No facility-administered medications prior to visit.    ROS Review of Systems  Constitutional: Positive for unexpected weight change. Negative for chills, activity change, appetite change and fatigue.  HENT: Negative for congestion, mouth sores and sinus pressure.   Eyes: Negative for visual disturbance.  Respiratory: Negative for cough and chest tightness.   Gastrointestinal: Negative for nausea, vomiting and abdominal pain.  Genitourinary: Negative for frequency, difficulty urinating and vaginal pain.  Musculoskeletal:  Positive for back pain, joint swelling and arthralgias. Negative for gait problem.  Skin: Positive for rash. Negative for pallor.  Neurological: Negative for dizziness, tremors, weakness, numbness and headaches.  Psychiatric/Behavioral: Negative for suicidal ideas, confusion and sleep disturbance. The patient is nervous/anxious.     Objective:  BP 139/85 mmHg  Pulse 82  Temp(Src) 98.7 F (37.1 C) (Oral)  Wt 230 lb (104.327 kg)  SpO2 99%  BP Readings from Last 3 Encounters:  02/01/15 139/85  12/14/14 157/99  12/14/14 128/76    Wt Readings from Last 3 Encounters:  02/01/15 230 lb (104.327 kg)  12/14/14 235 lb 4 oz (106.709 kg)  12/01/14 232 lb (105.235 kg)    Physical Exam  Constitutional: She appears well-developed. No distress.  HENT:  Head: Normocephalic.  Right Ear: External ear normal.  Left Ear: External ear normal.  Nose: Nose normal.  Mouth/Throat: Oropharynx is clear and moist.  Eyes: Conjunctivae are normal. Pupils are equal, round, and reactive to light. Right eye exhibits no discharge. Left eye exhibits no discharge.  Neck: Normal range of motion. Neck supple. No JVD present. No tracheal deviation present. No thyromegaly present.  Cardiovascular: Normal rate, regular rhythm and normal heart sounds.   Pulmonary/Chest: No stridor. No respiratory distress. She has no wheezes.  Abdominal: Soft. Bowel sounds are normal. She exhibits no distension and no mass. There is no tenderness. There is no  rebound and no guarding.  Musculoskeletal: She exhibits no edema or tenderness.  Lymphadenopathy:    She has no cervical adenopathy.  Neurological: She displays normal reflexes. No cranial nerve deficit. She exhibits normal muscle tone. Coordination normal.  Skin: No rash noted. No erythema.  Psychiatric: She has a normal mood and affect. Her behavior is normal. Judgment and thought content normal.  Obese  Lab Results  Component Value Date   WBC 9.7 11/22/2014   HGB 12.0  11/22/2014   HCT 36.3 11/22/2014   PLT 235.0 11/22/2014   GLUCOSE 104* 12/01/2014   CHOL 129 09/06/2012   TRIG 63.0 09/06/2012   HDL 33.90* 09/06/2012   LDLCALC 83 09/06/2012   ALT 12 09/06/2012   AST 14 09/06/2012   NA 139 12/01/2014   K 3.5 12/01/2014   CL 104 12/01/2014   CREATININE 0.93 12/01/2014   BUN 19 12/01/2014   CO2 26 12/01/2014   TSH 1.14 09/06/2012   HGBA1C 5.7 08/11/2011    No results found.  Assessment & Plan:   Diagnoses and all orders for this visit:  Arthralgia  Right wrist pain  Essential hypertension  CVA (cerebral vascular accident)  I am having Ms. Barkan maintain her aspirin, Vitamin D, multivitamin, vitamin C, fish oil-omega-3 fatty acids, metoprolol succinate, losartan, methocarbamol, meloxicam, amLODipine, and pravastatin.  No orders of the defined types were placed in this encounter.     Follow-up: Return in about 4 weeks (around 03/01/2015) for a follow-up visit.  Walker Kehr, MD

## 2015-02-01 NOTE — Patient Instructions (Signed)
I would like to stop your Toprol for 2-3 weeks due to a possible "lupus-like syndrome" that metoprolol may cause

## 2015-02-01 NOTE — Assessment & Plan Note (Addendum)
9/16 ?etiology Dr Trudie Reed ref pt to a "blood doctor" - pt has an appt w/Dr Marin Olp. Pt is taking Advil prn. I would like to d/c Toprol - possible "lupus-like syndrome"

## 2015-02-01 NOTE — Progress Notes (Signed)
Pre visit review using our clinic review tool, if applicable. No additional management support is needed unless otherwise documented below in the visit note. 

## 2015-02-01 NOTE — Assessment & Plan Note (Signed)
9/16 pt stopped ASA - ?reason Losartan, Norvasc, Toprol

## 2015-02-01 NOTE — Assessment & Plan Note (Signed)
  Dr Trudie Reed ref pt to a "blood doctor" - pt has an appt w/Dr Marin Olp. Pt is taking Advil prn.

## 2015-02-09 ENCOUNTER — Encounter: Payer: Medicare Other | Admitting: Internal Medicine

## 2015-03-01 ENCOUNTER — Ambulatory Visit (HOSPITAL_BASED_OUTPATIENT_CLINIC_OR_DEPARTMENT_OTHER): Payer: Medicare Other

## 2015-03-01 ENCOUNTER — Ambulatory Visit (HOSPITAL_BASED_OUTPATIENT_CLINIC_OR_DEPARTMENT_OTHER): Payer: Medicare Other | Admitting: Hematology & Oncology

## 2015-03-01 ENCOUNTER — Ambulatory Visit: Payer: Medicare Other | Admitting: Internal Medicine

## 2015-03-01 ENCOUNTER — Encounter: Payer: Self-pay | Admitting: Hematology & Oncology

## 2015-03-01 ENCOUNTER — Ambulatory Visit: Payer: Medicare Other

## 2015-03-01 ENCOUNTER — Ambulatory Visit (HOSPITAL_BASED_OUTPATIENT_CLINIC_OR_DEPARTMENT_OTHER)
Admission: RE | Admit: 2015-03-01 | Discharge: 2015-03-01 | Disposition: A | Discharge status: Home / Self Care | Payer: Medicare Other | Source: Ambulatory Visit | Attending: Hematology & Oncology | Admitting: Hematology & Oncology

## 2015-03-01 VITALS — BP 165/81 | HR 83 | Temp 98.3°F | Resp 16 | Ht 67.0 in | Wt 230.0 lb

## 2015-03-01 DIAGNOSIS — D472 Monoclonal gammopathy: Secondary | ICD-10-CM | POA: Insufficient documentation

## 2015-03-01 DIAGNOSIS — L03031 Cellulitis of right toe: Secondary | ICD-10-CM

## 2015-03-01 LAB — CBC WITH DIFFERENTIAL (CANCER CENTER ONLY)
BASO#: 0 10*3/uL (ref 0.0–0.2)
BASO%: 0.2 % (ref 0.0–2.0)
EOS ABS: 0.1 10*3/uL (ref 0.0–0.5)
EOS%: 1.4 % (ref 0.0–7.0)
HCT: 31.6 % — ABNORMAL LOW (ref 34.8–46.6)
HGB: 10.4 g/dL — ABNORMAL LOW (ref 11.6–15.9)
LYMPH#: 2.1 10*3/uL (ref 0.9–3.3)
LYMPH%: 36.7 % (ref 14.0–48.0)
MCH: 30.9 pg (ref 26.0–34.0)
MCHC: 32.9 g/dL (ref 32.0–36.0)
MCV: 94 fL (ref 81–101)
MONO#: 0.5 10*3/uL (ref 0.1–0.9)
MONO%: 8.9 % (ref 0.0–13.0)
NEUT%: 52.8 % (ref 39.6–80.0)
NEUTROS ABS: 3 10*3/uL (ref 1.5–6.5)
PLATELETS: 207 10*3/uL (ref 145–400)
RBC: 3.37 10*6/uL — AB (ref 3.70–5.32)
RDW: 14.1 % (ref 11.1–15.7)
WBC: 5.6 10*3/uL (ref 3.9–10.0)

## 2015-03-01 LAB — CMP (CANCER CENTER ONLY)
ALT: 26 U/L (ref 10–47)
AST: 30 U/L (ref 11–38)
Albumin: 3.3 g/dL (ref 3.3–5.5)
Alkaline Phosphatase: 67 U/L (ref 26–84)
BUN, Bld: 10 mg/dL (ref 7–22)
CHLORIDE: 105 meq/L (ref 98–108)
CO2: 29 mEq/L (ref 18–33)
CREATININE: 0.6 mg/dL (ref 0.6–1.2)
Calcium: 8.9 mg/dL (ref 8.0–10.3)
Glucose, Bld: 92 mg/dL (ref 73–118)
Potassium: 3.1 mEq/L — ABNORMAL LOW (ref 3.3–4.7)
SODIUM: 141 meq/L (ref 128–145)
TOTAL PROTEIN: 7.2 g/dL (ref 6.4–8.1)
Total Bilirubin: 1.4 mg/dl (ref 0.20–1.60)

## 2015-03-01 MED ORDER — DOXYCYCLINE HYCLATE 100 MG PO TABS: 100.0000 mg | ORAL_TABLET | Freq: Two times a day (BID) | ORAL | Status: DC

## 2015-03-01 NOTE — Progress Notes (Signed)
Referral MD  Reason for Referral: MGUS versus plasma cell disorder   Chief Complaint  Patient presents with  . OTHER    New Patient  : I have a blood problem.  HPI: Mindy Taylor is a very nice and young looking 81 year old African-American female. She does have multiple medical problems.  She's not felt well since June. She has seen multiple doctors. 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 I think this was ordered by Dr. Trudie Reed of Rheumatology.  The M spike was 1.5 g/dL area did from the records that I have, I don't see that it was further characterized.  She had a set of other labs drawn. She had a ANA that was negative. She had hepatitis studies that were negative. She was negative for HLA-B 27. She had a normal C-reactive protein. She had a C3 level that was normal. A C4 that was slightly elevated.  Mindy Taylor that she has not felt well since June. She was told that she had cellulitis. She was never admitted to the hospital.  Again, she has seen multiple doctors.  She's had no obvious infections.  She had what sounds like an ovarian cyst that ruptured when she was 81 years old. Outside of that, she is never had surgery.  She does hurt in her joints. She has some stiffness in her shoulders.  She's had no change in bowel or bladder habits.  She's had no weight loss or weight gain.  She's had no thyroid issues. She's had no rashes. She's had some swelling in her feet.  She is kindly referred to the [facility] for an evaluation.              Past Medical History  Diagnosis Date  . ANXIETY 03/05/2007  . ATAXIA 12/26/2009  . BEE STING 03/05/2007  . BRONCHITIS, ACUTE 06/28/2010  . CANDIDIASIS, VAGINAL 09/08/2008  . CEREBROVASCULAR ACCIDENT, HX OF 01/04/2010  . CERUMEN IMPACTION 06/05/2008  . DIZZINESS 12/26/2009  . Epistaxis 06/05/2008  . Headache(784.0) 06/05/2008  . HYPERLIPIDEMIA 12/08/2006  . HYPERTENSION 12/08/2006  .  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 NECK PAIN 06/05/2008  . OBESITY 09/07/2009  . ONYCHOMYCOSIS 03/09/2009  . OSTEOARTHRITIS 12/08/2006  . PARESTHESIA 12/26/2009  . TOBACCO USE, QUIT 03/09/2009  :  Past Surgical History  Procedure Laterality Date  . Breast surgery    . Abdominal hysterectomy    :   Current outpatient prescriptions:  .  amLODipine (NORVASC) 5 MG tablet, TAKE 1 TABLET DAILY, Disp: 90 tablet, Rfl: 3 .  aspirin 81 MG tablet, Take 81 mg by mouth every evening. , Disp: , Rfl:  .  Cholecalciferol (VITAMIN D) 1000 UNITS capsule, Take 1 capsule (1,000 Units total) by mouth daily., Disp: 100 capsule, Rfl: 3 .  fish oil-omega-3 fatty acids 1000 MG capsule, Take 1 g by mouth daily.  , Disp: , Rfl:  .  ibuprofen (ADVIL) 200 MG tablet, Take 1-2 tablets (200-400 mg total) by mouth every 8 (eight) hours as needed for moderate pain., Disp: 30 tablet, Rfl: 0 .  losartan (COZAAR) 100 MG tablet, Take 1 tablet (100 mg total) by mouth every evening., Disp: 90 tablet, Rfl: 3 .  methocarbamol (ROBAXIN) 500 MG tablet, Take 1 tablet (500 mg total) by mouth every 8 (eight) hours as needed for muscle spasms., Disp: 30 tablet, Rfl: 1 .  metoprolol succinate (TOPROL-XL) 50 MG 24 hr tablet, TAKE 1 TABLET TWICE A DAY, Disp:  180 tablet, Rfl: 3 .  Multiple Vitamin (MULTIVITAMIN) tablet, Take 1 tablet by mouth daily.  , Disp: , Rfl:  .  pravastatin (PRAVACHOL) 40 MG tablet, TAKE 1 TABLET DAILY, Disp: 90 tablet, Rfl: 2 .  vitamin C (ASCORBIC ACID) 500 MG tablet, Take 500 mg by mouth daily.  , Disp: , Rfl:  .  doxycycline (VIBRA-TABS) 100 MG tablet, Take 1 tablet (100 mg total) by mouth 2 (two) times daily., Disp: 20 tablet, Rfl: 0:  :  Allergies  Allergen Reactions  . Bee Venom Swelling  . Ceftin [Cefuroxime Axetil]     Felt wierd  . Spider Antivenin [Antivenin Latrodectus Mactans] Other (See Comments)    pain  :  Family History  Problem Relation Age of Onset  . Hypertension Other   . Hypertension  Mother   . Hypertension Father   :  Social History   Social History  . Marital Status: Widowed    Spouse Name: N/A  . Number of Children: N/A  . Years of Education: N/A   Occupational History  . Not on file.   Social History Main Topics  . Smoking status: Former Research scientist (life sciences)  . Smokeless tobacco: Not on file  . Alcohol Use: No  . Drug Use: No  . Sexual Activity: No   Other Topics Concern  . Not on file   Social History Narrative  :  Pertinent items are noted in HPI.  Exam: _0 @  well-developed and well-nourished African-American female in no obvious distress. Vital signs show temperature of 98.3. Pulse 83. Blood pressure 165/81. Weight is 230 pounds. Head and neck exam shows no ocular or oral lesions. She has no palpable cervical or supraclavicular lymph nodes. Lungs are clear bilaterally. Cardiac exam regular rate and rhythm with no murmurs, rubs or bruits. Abdomen is soft. She is slightly obese. She has the left carotid scar is well-healed in the lower abdomen. She has no fluid wave. There is no palpable liver or spleen tip. Back exam shows no tenderness over the spine, ribs or hips. Extremities shows no clubbing, cyanosis or edema. There may be some slight pitting edema in the top of her feet. Skin exam shows no rashes, ecchymoses or petechia.    Recent Labs  03/01/15 1448  WBC 5.6  HGB 10.4*  HCT 31.6*  PLT 207    Recent Labs  03/01/15 1448  NA 141  K 3.1*  CL 105  CO2 29  GLUCOSE 92  BUN 10  CREATININE 0.6  CALCIUM 8.9    Blood smear review: Normochromic and was see population of red blood cells. I see no rouleau formation. She has no nucleator of blood cells. She has no teardrop cells. I see no target cells. White cells been normal in morphology maturation. She has no immature myeloid or lymphoid forms. Platelets are adequate in number and size. Platelets are well granulated.  Pathology: None     Assessment and Plan:  Mindy Taylor is a very nice  81 year old African-American female. She has a monoclonal spike in her blood.  I would be very surprised if she has any type of plasma cell disorder. At her age, possibly 15-20% of patients will have a monoclonal spike, similar to an MGUS.  However, she is anemic. As such, I think we are going to have to evaluate her for myeloma. I think she will need a bone survey. I think she will also need a bone marrow biopsy.  I spoke to she and her niece.  They're both very nice. I gave Mindy Taylor a prayer blanket. She was very thankful for this.  I slander her what we were looking for. I explained to her what myeloma is.  I'm not sure how to explain all of her issues. Again I would be surprised if she had a plasma cell issue.  I spent about 45 minutes with her.  If the bone survey and bone marrow biopsy are negative, I'm not sure what else that we could look for. She may need to have a CT scan done looking for lymphadenopathy or a lymphoproliferative problem.

## 2015-03-02 ENCOUNTER — Telehealth: Payer: Self-pay | Admitting: Nurse Practitioner

## 2015-03-02 NOTE — Telephone Encounter (Addendum)
LVM for patient to return non-urgent  Call to the the office.   ----- Message from [name], MD sent at 03/02/2015  9:41 AM EDT ----- Call - bone are normal!!  Nothing that looks like myeloma or any kind of cancer!!  i am praying for you!!  Mindy Taylor

## 2015-03-05 ENCOUNTER — Telehealth: Payer: Self-pay | Admitting: *Deleted

## 2015-03-05 LAB — PROTEIN ELECTROPHORESIS, SERUM, WITH REFLEX
Abnormal Protein Band1: 1.5 g/dL
Albumin ELP: 3.4 g/dL — ABNORMAL LOW (ref 3.8–4.8)
Alpha-1-Globulin: 0.3 g/dL (ref 0.2–0.3)
Alpha-2-Globulin: 0.8 g/dL (ref 0.5–0.9)
Beta 2: 0.4 g/dL (ref 0.2–0.5)
Beta Globulin: 0.4 g/dL (ref 0.4–0.6)
Gamma Globulin: 1.9 g/dL — ABNORMAL HIGH (ref 0.8–1.7)
Total Protein, Serum Electrophoresis: 7.2 g/dL (ref 6.1–8.1)

## 2015-03-05 LAB — KAPPA/LAMBDA LIGHT CHAINS
KAPPA FREE LGHT CHN: 2.69 mg/dL — AB (ref 0.33–1.94)
Kappa:Lambda Ratio: 1.88 — ABNORMAL HIGH (ref 0.26–1.65)
LAMBDA FREE LGHT CHN: 1.43 mg/dL (ref 0.57–2.63)

## 2015-03-05 LAB — IGG, IGA, IGM
IGA: 77 mg/dL (ref 69–380)
IGG (IMMUNOGLOBIN G), SERUM: 2300 mg/dL — AB (ref 690–1700)
IGM, SERUM: 36 mg/dL — AB (ref 52–322)

## 2015-03-05 LAB — BETA 2 MICROGLOBULIN, SERUM: Beta-2 Microglobulin: 2.7 mg/L — ABNORMAL HIGH (ref <=2.51)

## 2015-03-05 LAB — IFE INTERPRETATION

## 2015-03-05 NOTE — Telephone Encounter (Signed)
-----   Message from [name], MD sent at 03/02/2015  9:41 AM EDT ----- Call - bone are normal!!  Nothing that looks like myeloma or any kind of cancer!!  i am praying for you!!  Laurey Arrow

## 2015-03-14 ENCOUNTER — Emergency Department (HOSPITAL_BASED_OUTPATIENT_CLINIC_OR_DEPARTMENT_OTHER): Payer: Medicare Other

## 2015-03-14 ENCOUNTER — Emergency Department (HOSPITAL_BASED_OUTPATIENT_CLINIC_OR_DEPARTMENT_OTHER)
Admission: EM | Admit: 2015-03-14 | Discharge: 2015-03-14 | Disposition: A | Discharge status: Home / Self Care | Payer: Medicare Other | Attending: Emergency Medicine | Admitting: Emergency Medicine

## 2015-03-14 ENCOUNTER — Encounter (HOSPITAL_BASED_OUTPATIENT_CLINIC_OR_DEPARTMENT_OTHER): Payer: Self-pay

## 2015-03-14 ENCOUNTER — Other Ambulatory Visit: Payer: Self-pay | Admitting: Radiology

## 2015-03-14 DIAGNOSIS — M25531 Pain in right wrist: Secondary | ICD-10-CM | POA: Diagnosis not present

## 2015-03-14 DIAGNOSIS — E669 Obesity, unspecified: Secondary | ICD-10-CM | POA: Diagnosis not present

## 2015-03-14 DIAGNOSIS — Z87828 Personal history of other (healed) physical injury and trauma: Secondary | ICD-10-CM | POA: Insufficient documentation

## 2015-03-14 DIAGNOSIS — E785 Hyperlipidemia, unspecified: Secondary | ICD-10-CM | POA: Diagnosis not present

## 2015-03-14 DIAGNOSIS — M1990 Unspecified osteoarthritis, unspecified site: Secondary | ICD-10-CM | POA: Insufficient documentation

## 2015-03-14 DIAGNOSIS — I10 Essential (primary) hypertension: Secondary | ICD-10-CM | POA: Insufficient documentation

## 2015-03-14 DIAGNOSIS — N390 Urinary tract infection, site not specified: Secondary | ICD-10-CM | POA: Insufficient documentation

## 2015-03-14 DIAGNOSIS — Z7982 Long term (current) use of aspirin: Secondary | ICD-10-CM | POA: Diagnosis not present

## 2015-03-14 DIAGNOSIS — Z8669 Personal history of other diseases of the nervous system and sense organs: Secondary | ICD-10-CM | POA: Diagnosis not present

## 2015-03-14 DIAGNOSIS — M7989 Other specified soft tissue disorders: Secondary | ICD-10-CM | POA: Diagnosis not present

## 2015-03-14 DIAGNOSIS — Z79899 Other long term (current) drug therapy: Secondary | ICD-10-CM | POA: Insufficient documentation

## 2015-03-14 DIAGNOSIS — Z8709 Personal history of other diseases of the respiratory system: Secondary | ICD-10-CM | POA: Insufficient documentation

## 2015-03-14 DIAGNOSIS — Z8619 Personal history of other infectious and parasitic diseases: Secondary | ICD-10-CM | POA: Diagnosis not present

## 2015-03-14 DIAGNOSIS — Z8659 Personal history of other mental and behavioral disorders: Secondary | ICD-10-CM | POA: Diagnosis not present

## 2015-03-14 DIAGNOSIS — M25431 Effusion, right wrist: Secondary | ICD-10-CM | POA: Diagnosis present

## 2015-03-14 DIAGNOSIS — E876 Hypokalemia: Secondary | ICD-10-CM | POA: Diagnosis not present

## 2015-03-14 DIAGNOSIS — Z87891 Personal history of nicotine dependence: Secondary | ICD-10-CM | POA: Diagnosis not present

## 2015-03-14 LAB — BASIC METABOLIC PANEL
ANION GAP: 4 — AB (ref 5–15)
BUN: 11 mg/dL (ref 6–20)
CALCIUM: 8.6 mg/dL — AB (ref 8.9–10.3)
CO2: 29 mmol/L (ref 22–32)
Chloride: 105 mmol/L (ref 101–111)
Creatinine, Ser: 0.61 mg/dL (ref 0.44–1.00)
GLUCOSE: 107 mg/dL — AB (ref 65–99)
Potassium: 3 mmol/L — ABNORMAL LOW (ref 3.5–5.1)
Sodium: 138 mmol/L (ref 135–145)

## 2015-03-14 LAB — URINALYSIS, ROUTINE W REFLEX MICROSCOPIC
Bilirubin Urine: NEGATIVE
Glucose, UA: NEGATIVE mg/dL
Ketones, ur: NEGATIVE mg/dL
NITRITE: POSITIVE — AB
PH: 6 (ref 5.0–8.0)
Protein, ur: NEGATIVE mg/dL
SPECIFIC GRAVITY, URINE: 1.02 (ref 1.005–1.030)
Urobilinogen, UA: 0.2 mg/dL (ref 0.0–1.0)

## 2015-03-14 LAB — CBC WITH DIFFERENTIAL/PLATELET
BASOS ABS: 0 10*3/uL (ref 0.0–0.1)
BASOS PCT: 0 %
EOS ABS: 0.1 10*3/uL (ref 0.0–0.7)
EOS PCT: 1 %
HCT: 33 % — ABNORMAL LOW (ref 36.0–46.0)
Hemoglobin: 10.9 g/dL — ABNORMAL LOW (ref 12.0–15.0)
Lymphocytes Relative: 16 %
Lymphs Abs: 1.3 10*3/uL (ref 0.7–4.0)
MCH: 30.8 pg (ref 26.0–34.0)
MCHC: 33 g/dL (ref 30.0–36.0)
MCV: 93.2 fL (ref 78.0–100.0)
Monocytes Absolute: 0.7 10*3/uL (ref 0.1–1.0)
Monocytes Relative: 8 %
Neutro Abs: 5.9 10*3/uL (ref 1.7–7.7)
Neutrophils Relative %: 75 %
PLATELETS: 221 10*3/uL (ref 150–400)
RBC: 3.54 MIL/uL — ABNORMAL LOW (ref 3.87–5.11)
RDW: 13.6 % (ref 11.5–15.5)
WBC: 7.9 10*3/uL (ref 4.0–10.5)

## 2015-03-14 LAB — URINE MICROSCOPIC-ADD ON

## 2015-03-14 LAB — SEDIMENTATION RATE: Sed Rate: 98 mm/hr — ABNORMAL HIGH (ref 0–22)

## 2015-03-14 LAB — URIC ACID: Uric Acid, Serum: 3.5 mg/dL (ref 2.3–6.6)

## 2015-03-14 MED ORDER — CEPHALEXIN 250 MG PO CAPS
1000.0000 mg | ORAL_CAPSULE | Freq: Once | ORAL | Status: DC
  Filled 2015-03-14: qty 4

## 2015-03-14 MED ORDER — ONDANSETRON 4 MG PO TBDP
4.0000 mg | ORAL_TABLET | Freq: Once | ORAL | Status: AC
  Administered 2015-03-14: 4 mg via ORAL
  Filled 2015-03-14: qty 1

## 2015-03-14 MED ORDER — POTASSIUM CHLORIDE CRYS ER 20 MEQ PO TBCR
EXTENDED_RELEASE_TABLET | ORAL | Status: AC
  Filled 2015-03-14: qty 2

## 2015-03-14 MED ORDER — HYDROCODONE-ACETAMINOPHEN 5-325 MG PO TABS: 1.0000 | ORAL_TABLET | Freq: Four times a day (QID) | ORAL | Status: DC | PRN

## 2015-03-14 MED ORDER — CEPHALEXIN 500 MG PO CAPS: 500.0000 mg | ORAL_CAPSULE | Freq: Two times a day (BID) | ORAL | Status: DC

## 2015-03-14 MED ORDER — CEPHALEXIN 250 MG PO CAPS
500.0000 mg | ORAL_CAPSULE | Freq: Once | ORAL | Status: AC
  Administered 2015-03-14: 500 mg via ORAL

## 2015-03-14 MED ORDER — DEXAMETHASONE SODIUM PHOSPHATE 10 MG/ML IJ SOLN
10.0000 mg | Freq: Once | INTRAMUSCULAR | Status: AC
  Administered 2015-03-14: 10 mg via INTRAMUSCULAR
  Filled 2015-03-14: qty 1

## 2015-03-14 MED ORDER — MORPHINE SULFATE (PF) 4 MG/ML IV SOLN
4.0000 mg | Freq: Once | INTRAVENOUS | Status: AC
  Administered 2015-03-14: 4 mg via INTRAMUSCULAR
  Filled 2015-03-14: qty 1

## 2015-03-14 MED ORDER — POTASSIUM CHLORIDE CRYS ER 20 MEQ PO TBCR
40.0000 meq | EXTENDED_RELEASE_TABLET | Freq: Once | ORAL | Status: AC
  Administered 2015-03-14: 40 meq via ORAL

## 2015-03-14 NOTE — ED Notes (Signed)
Pt presents with right wrist, erythema, swelling and warmth to touch.  States been seen here for same previously.  Denies hx of gout.  No known injury.

## 2015-03-14 NOTE — Discharge Instructions (Signed)
Joint Pain Joint pain, which is also called arthralgia, can be caused by many things. Joint pain often goes away when you follow your health care provider's instructions for relieving pain at home. However, joint pain can also be caused by conditions that require further treatment. Common causes of joint pain include:  Bruising in the area of the joint.  Overuse of the joint.  Wear and tear on the joints that occur with aging (osteoarthritis).  Various other forms of arthritis.  A buildup of a crystal form of uric acid in the joint (gout).  Infections of the joint (septic arthritis) or of the bone (osteomyelitis). Your health care provider may recommend medicine to help with the pain. If your joint pain continues, additional tests may be needed to diagnose your condition. HOME CARE INSTRUCTIONS Watch your condition for any changes. Follow these instructions as directed to lessen the pain that you are feeling.  Take medicines only as directed by your health care provider.  Rest the affected area for as long as your health care provider says that you should. If directed to do so, raise the painful joint above the level of your heart while you are sitting or lying down.  Do not do things that cause or worsen pain.  If directed, apply ice to the painful area:  Put ice in a plastic bag.  Place a towel between your skin and the bag.  Leave the ice on for 20 minutes, 2-3 times per day.  Wear an elastic bandage, splint, or sling as directed by your health care provider. Loosen the elastic bandage or splint if your fingers or toes become numb and tingle, or if they turn cold and blue.  Begin exercising or stretching the affected area as directed by your health care provider. Ask your health care provider what types of exercise are safe for you.  Keep all follow-up visits as directed by your health care provider. This is important. SEEK MEDICAL CARE IF:  Your pain increases, and medicine  does not help.  Your joint pain does not improve within 3 days.  You have increased bruising or swelling.  You have a fever.  You lose 10 lb (4.5 kg) or more without trying. SEEK IMMEDIATE MEDICAL CARE IF:  You are not able to move the joint.  Your fingers or toes become numb or they turn cold and blue.   This information is not intended to replace advice given to you by your health care provider. Make sure you discuss any questions you have with your health care provider.   Document Released: 05/12/2005 Document Revised: 06/02/2014 Document Reviewed: 02/21/2014 Elsevier Interactive Patient Education 2016 [employer] Urinary Tract Infection Urinary tract infections (UTIs) can develop anywhere along your urinary tract. Your urinary tract is your body's drainage system for removing wastes and extra water. Your urinary tract includes two kidneys, two ureters, a bladder, and a urethra. Your kidneys are a pair of bean-shaped organs. Each kidney is about the size of your fist. They are located below your ribs, one on each side of your spine. CAUSES Infections are caused by microbes, which are microscopic organisms, including fungi, viruses, and bacteria. These organisms are so small that they can only be seen through a microscope. Bacteria are the microbes that most commonly cause UTIs. SYMPTOMS  Symptoms of UTIs may vary by age and gender of the patient and by the location of the infection. Symptoms in young women typically include a frequent and intense urge to  urinate and a painful, burning feeling in the bladder or urethra during urination. Older women and men are more likely to be tired, shaky, and weak and have muscle aches and abdominal pain. A fever may mean the infection is in your kidneys. Other symptoms of a kidney infection include pain in your back or sides below the ribs, nausea, and vomiting. DIAGNOSIS To diagnose a UTI, your caregiver will ask you about your symptoms. Your  caregiver will also ask you to provide a urine sample. The urine sample will be tested for bacteria and white blood cells. White blood cells are made by your body to help fight infection. TREATMENT  Typically, UTIs can be treated with medication. Because most UTIs are caused by a bacterial infection, they usually can be treated with the use of antibiotics. The choice of antibiotic and length of treatment depend on your symptoms and the type of bacteria causing your infection. HOME CARE INSTRUCTIONS  If you were prescribed antibiotics, take them exactly as your caregiver instructs you. Finish the medication even if you feel better after you have only taken some of the medication.  Drink enough water and fluids to keep your urine clear or pale yellow.  Avoid caffeine, tea, and carbonated beverages. They tend to irritate your bladder.  Empty your bladder often. Avoid holding urine for long periods of time.  Empty your bladder before and after sexual intercourse.  After a bowel movement, women should cleanse from front to back. Use each tissue only once. SEEK MEDICAL CARE IF:   You have back pain.  You develop a fever.  Your symptoms do not begin to resolve within 3 days. SEEK IMMEDIATE MEDICAL CARE IF:   You have severe back pain or lower abdominal pain.  You develop chills.  You have nausea or vomiting.  You have continued burning or discomfort with urination. MAKE SURE YOU:   Understand these instructions.  Will watch your condition.  Will get help right away if you are not doing well or get worse.   This information is not intended to replace advice given to you by your health care provider. Make sure you discuss any questions you have with your health care provider.   Document Released: 02/19/2005 Document Revised: 01/31/2015 Document Reviewed: 06/20/2011 Elsevier Interactive Patient Education [employer].

## 2015-03-14 NOTE — ED Provider Notes (Signed)
CSN: [id number]     Arrival date & time 03/14/15  1141 History   First MD Initiated Contact with Patient 03/14/15 1204     Chief Complaint  Patient presents with  . Joint Swelling     (Consider location/radiation/quality/duration/timing/severity/associated sxs/prior Treatment) HPI Mindy Taylor An 81 year old female with a past medical history of arthritis, hypertension, hyperlipidemia, previous CVA, anxiety, and MGUS who presents emergency Department with chief complaint of right wrist pain. Patient was seen for a similar episode of pain in the right elbow on 12/14/2014. The ED attending provider felt that this is very likely gout. The patient was adamant that this was a cellulitis. She has history of previous cellulitis secondary to chronic venous stasis dermatitis in the bilateral lower extremities. Patient feels that her right wrist swelling and pain is cellulitis today. Review of her chart shows that she has not had an elevated uric acid level in the past. The patient states that she has been trying to get her house prepared for moving. She's been doing a lot of work lately. Yesterday she began having pain in the right wrist and thought it might be, carpal tunnel syndrome. The patient awoke this morning she noticed heat, redness, swelling over the right wrist and extending into the hand. She complains of pain with movement of the joint and rates it at 10 out of 10. She denies any injury to the arm, cuts or other pathogen entryway's. She denies fevers, chills, myalgias or other signs of systemic infection. The patient states that "the cellulitis is in my blood. Something is wrong with my blood and that is why I keep getting the cellulitis." Patient is a poor historian. History is limited by the patient's insight, patient's ability to communicate effectively, and willingness to listen.   Past Medical History  Diagnosis Date  . ANXIETY 03/05/2007  . ATAXIA 12/26/2009  . BEE STING 03/05/2007  .  BRONCHITIS, ACUTE 06/28/2010  . CANDIDIASIS, VAGINAL 09/08/2008  . CEREBROVASCULAR ACCIDENT, HX OF 01/04/2010  . CERUMEN IMPACTION 06/05/2008  . DIZZINESS 12/26/2009  . Epistaxis 06/05/2008  . Headache(784.0) 06/05/2008  . HYPERLIPIDEMIA 12/08/2006  . HYPERTENSION 12/08/2006  . 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 NECK PAIN 06/05/2008  . OBESITY 09/07/2009  . ONYCHOMYCOSIS 03/09/2009  . OSTEOARTHRITIS 12/08/2006  . PARESTHESIA 12/26/2009  . TOBACCO USE, QUIT 03/09/2009   Past Surgical History  Procedure Laterality Date  . Breast surgery    . Abdominal hysterectomy     Family History  Problem Relation Age of Onset  . Hypertension Other   . Hypertension Mother   . Hypertension Father    Social History  Substance Use Topics  . Smoking status: Former Research scientist (life sciences)  . Smokeless tobacco: None  . Alcohol Use: No   OB History    No data available     Review of Systems  Ten systems reviewed and are negative for acute change, except as noted in the HPI.    Allergies  Bee venom; Ceftin; and Spider antivenin  Home Medications   Prior to Admission medications   Medication Sig Start Date End Date Taking? Authorizing Provider  amLODipine (NORVASC) 5 MG tablet TAKE 1 TABLET DAILY 01/11/15   Lew Dawes V, MD  aspirin 81 MG tablet Take 81 mg by mouth every evening.     Historical Provider, MD  Cholecalciferol (VITAMIN D) 1000 UNITS capsule Take 1 capsule (1,000 Units total) by mouth daily. 01/03/11   Aleksei Plotnikov V, MD  doxycycline (VIBRA-TABS) 100 MG tablet  Take 1 tablet (100 mg total) by mouth 2 (two) times daily. 03/01/15   [name], MD  fish oil-omega-3 fatty acids 1000 MG capsule Take 1 g by mouth daily.      Historical Provider, MD  ibuprofen (ADVIL) 200 MG tablet Take 1-2 tablets (200-400 mg total) by mouth every 8 (eight) hours as needed for moderate pain. 02/01/15   Aleksei Plotnikov V, MD  losartan (COZAAR) 100 MG tablet Take 1 tablet (100 mg total) by mouth every evening. 11/22/14    Lew Dawes V, MD  methocarbamol (ROBAXIN) 500 MG tablet Take 1 tablet (500 mg total) by mouth every 8 (eight) hours as needed for muscle spasms. 11/28/14   Aleksei Plotnikov V, MD  metoprolol succinate (TOPROL-XL) 50 MG 24 hr tablet TAKE 1 TABLET TWICE A DAY 10/09/14   Aleksei Plotnikov V, MD  Multiple Vitamin (MULTIVITAMIN) tablet Take 1 tablet by mouth daily.      Historical Provider, MD  pravastatin (PRAVACHOL) 40 MG tablet TAKE 1 TABLET DAILY 01/19/15   Lew Dawes V, MD  vitamin C (ASCORBIC ACID) 500 MG tablet Take 500 mg by mouth daily.      Historical Provider, MD   BP 144/81 mmHg  Pulse 100  Temp(Src) 98.2 F (36.8 C) (Oral)  Resp 21  Wt 230 lb (104.327 kg)  SpO2 99% Physical Exam  Constitutional: She is oriented to person, place, and time. She appears well-developed and well-nourished. No distress.  HENT:  Head: Normocephalic and atraumatic.  Eyes: Conjunctivae are normal. No scleral icterus.  Neck: Normal range of motion.  Cardiovascular: Normal rate, regular rhythm and normal heart sounds.  Exam reveals no gallop and no friction rub.   No murmur heard. Pulmonary/Chest: Effort normal and breath sounds normal. No respiratory distress.  Abdominal: Soft. Bowel sounds are normal. She exhibits no distension and no mass. There is no tenderness. There is no guarding.  Musculoskeletal:  Right wrist is red, warm, swollen and exquisitely tender to palpation, she is able to move the joint. She is able to flex and extend the fingers. There is swelling into the hand and finger joints as well. The elbow and right shoulder are normal. She has strong peripheral pulses in this neurologically intact  Neurological: She is alert and oriented to person, place, and time.  Skin: Skin is warm and dry. She is not diaphoretic.  Nursing note and vitals reviewed.   ED Course  Procedures (including critical care time) Labs Review Labs Reviewed  URINALYSIS, ROUTINE W REFLEX MICROSCOPIC (NOT  AT [facility])    Imaging Review Dg Wrist Complete Right  03/14/2015  CLINICAL DATA:  Patient c/o right wrist swelling, pains, and warmth, all since yesterday, no injury EXAM: RIGHT WRIST - COMPLETE 3+ VIEW COMPARISON:  None. FINDINGS: There is no evidence of fracture or dislocation. There is generalized osteopenia. There is moderate osteoarthritis of the scaphotrapeziotrapezoid joint and first CMC joint. There is mild osteoarthritis of the distal radioulnar joint. Soft tissues are unremarkable. IMPRESSION: No acute osseous injury of the right wrist. Electronically Signed   By: Kathreen Devoid   On: 03/14/2015 12:20   Dg Hand Complete Right  03/14/2015  CLINICAL DATA:  Right wrist swelling, pain, and warmth since yesterday. Initial encounter. EXAM: RIGHT HAND - COMPLETE 3+ VIEW COMPARISON:  09/08/2012 FINDINGS: There is soft tissue swelling about the wrist, predominantly the volar aspect. Carpal bones are more fully evaluated on concurrent dedicated wrist radiographs. No acute fracture or dislocation is identified. The  bones are diffusely osteopenic. Degenerative changes are noted involving multiple interphalangeal joints, most notably the thumb IP joint and similar to the prior study. First CMC joint osteoarthrosis is also noted. No osseous erosion or radiopaque foreign body is seen. IMPRESSION: Soft tissue swelling without acute osseous abnormality identified. Electronically Signed   By: Logan Bores M.D.   On: 03/14/2015 12:11   I have personally reviewed and evaluated these images and lab results as part of my medical decision-making.   EKG Interpretation None      MDM   Final diagnoses:  Right wrist pain  UTI (lower urinary tract infection)    1:01 PM  BP 144/81 mmHg  Pulse 100  Temp(Src) 98.2 F (36.8 C) (Oral)  Resp 21  Wt 230 lb (104.327 kg)  SpO2 99%  Patient seen in shared visit with attending physician. Patient with a presentation concerning for gout versus pseudogout. I have  given the patient pain medication, a shot of Decadron. She has no history of diabetes. Patient is refusing homework at this time. She states "something wrong with my blood and I had plenty of lab work."  2:30 PM BP 151/69 mmHg  Pulse 84  Temp(Src) 99.1 F (37.3 C) (Oral)  Resp 18  Wt 230 lb (104.327 kg)  SpO2 98% Patient with UTI. Given her first dose of Keflex here. Afebrile. No leukocytosis.  Hypokalemia. Potassium given at discharge. Still feel this is likely gout/ pseudogout D/c with pain meds. Keflex. F/u with pcp. No concern for septic joint.  Margarita Mail, PA-C 03/14/15 [place], MD 03/15/15 [phone]

## 2015-03-14 NOTE — ED Notes (Signed)
Pt states she has a bone marrow aspiration scheduled at the cancer center at [facility] on Friday, and want to ensure that none of the medications we are prescribing will interfere with those results. This RN calls Roselyn Reef, RN at [facility] to advise, Roselyn Reef states that Dr. Deberah Pelton is not concerned about these prescriptions. Pt informed.

## 2015-03-16 ENCOUNTER — Encounter (HOSPITAL_COMMUNITY): Payer: Self-pay

## 2015-03-16 ENCOUNTER — Ambulatory Visit (HOSPITAL_COMMUNITY)
Admission: RE | Admit: 2015-03-16 | Discharge: 2015-03-16 | Disposition: A | Discharge status: Home / Self Care | Payer: Medicare Other | Source: Ambulatory Visit | Attending: Hematology & Oncology | Admitting: Hematology & Oncology

## 2015-03-16 DIAGNOSIS — D649 Anemia, unspecified: Secondary | ICD-10-CM | POA: Insufficient documentation

## 2015-03-16 DIAGNOSIS — Z79899 Other long term (current) drug therapy: Secondary | ICD-10-CM | POA: Insufficient documentation

## 2015-03-16 DIAGNOSIS — D472 Monoclonal gammopathy: Secondary | ICD-10-CM | POA: Diagnosis not present

## 2015-03-16 DIAGNOSIS — D4989 Neoplasm of unspecified behavior of other specified sites: Secondary | ICD-10-CM | POA: Diagnosis not present

## 2015-03-16 LAB — PROTIME-INR
INR: 1.04 (ref 0.00–1.49)
Prothrombin Time: 13.8 seconds (ref 11.6–15.2)

## 2015-03-16 LAB — APTT: aPTT: 34 seconds (ref 24–37)

## 2015-03-16 LAB — CBC
HCT: 31.9 % — ABNORMAL LOW (ref 36.0–46.0)
Hemoglobin: 10.4 g/dL — ABNORMAL LOW (ref 12.0–15.0)
MCH: 30.6 pg (ref 26.0–34.0)
MCHC: 32.6 g/dL (ref 30.0–36.0)
MCV: 93.8 fL (ref 78.0–100.0)
PLATELETS: 243 10*3/uL (ref 150–400)
RBC: 3.4 MIL/uL — AB (ref 3.87–5.11)
RDW: 14.5 % (ref 11.5–15.5)
WBC: 6.7 10*3/uL (ref 4.0–10.5)

## 2015-03-16 LAB — BONE MARROW EXAM

## 2015-03-16 MED ORDER — SODIUM CHLORIDE 0.9 % IV SOLN: Freq: Once | INTRAVENOUS | Status: DC

## 2015-03-16 MED ORDER — MIDAZOLAM HCL 2 MG/2ML IJ SOLN
INTRAMUSCULAR | Status: AC | PRN
  Administered 2015-03-16 (×2): 1 mg via INTRAVENOUS

## 2015-03-16 MED ORDER — FENTANYL CITRATE (PF) 100 MCG/2ML IJ SOLN
INTRAMUSCULAR | Status: AC | PRN
  Administered 2015-03-16: 50 ug via INTRAVENOUS

## 2015-03-16 MED ORDER — MIDAZOLAM HCL 2 MG/2ML IJ SOLN
INTRAMUSCULAR | Status: AC
  Filled 2015-03-16: qty 6

## 2015-03-16 MED ORDER — FENTANYL CITRATE (PF) 100 MCG/2ML IJ SOLN
INTRAMUSCULAR | Status: AC
  Filled 2015-03-16: qty 4

## 2015-03-16 NOTE — Progress Notes (Signed)
Pt waiting for driver to return to room. RN attempted to call x2, straight to voicemail.  Message left for driver to come ASAP upon receipt of voicemail.  Pt updated.  Coolidge Breeze, RN 03/16/2015 '

## 2015-03-16 NOTE — Procedures (Signed)
Technically successful CT guided bone marrow aspiration and biopsy of left iliac crest. No immediate complications.    SignedSandi Mariscal Pager: [phone] 03/16/2015, 11:44 AM

## 2015-03-16 NOTE — H&P (Signed)
 HPI: Patient with monoclonal spike seen by Dr. Ennever on 03/01/15 and scheduled today for image guided bone marrow biopsy.  The patient has had a H&P performed within the last 30 days, all history, medications, and exam have been reviewed. The patient denies any interval changes since the H&P.  Medications: Prior to Admission medications   Medication Sig Start Date End Date Taking? Authorizing Provider  amLODipine (NORVASC) 5 MG tablet TAKE 1 TABLET DAILY 01/11/15  Yes Aleksei Plotnikov V, MD  cephALEXin (KEFLEX) 500 MG capsule Take 1 capsule (500 mg total) by mouth 2 (two) times daily. 03/14/15  Yes Abigail Harris, PA-C  Cholecalciferol (VITAMIN D) 1000 UNITS capsule Take 1 capsule (1,000 Units total) by mouth daily. 01/03/11  Yes Aleksei Plotnikov V, MD  fish oil-omega-3 fatty acids 1000 MG capsule Take 1 g by mouth daily.     Yes Historical Provider, MD  losartan (COZAAR) 100 MG tablet Take 1 tablet (100 mg total) by mouth every evening. 11/22/14  Yes Aleksei Plotnikov V, MD  metoprolol succinate (TOPROL-XL) 50 MG 24 hr tablet TAKE 1 TABLET TWICE A DAY 10/09/14  Yes Aleksei Plotnikov V, MD  Multiple Vitamin (MULTIVITAMIN) tablet Take 1 tablet by mouth daily.     Yes Historical Provider, MD  vitamin C (ASCORBIC ACID) 500 MG tablet Take 500 mg by mouth daily.     Yes Historical Provider, MD  aspirin 81 MG tablet Take 81 mg by mouth every evening.     Historical Provider, MD  doxycycline (VIBRA-TABS) 100 MG tablet Take 1 tablet (100 mg total) by mouth 2 (two) times daily. 03/01/15   Peter R Ennever, MD  HYDROcodone-acetaminophen (NORCO/VICODIN) 5-325 MG tablet Take 1 tablet by mouth every 6 (six) hours as needed for moderate pain. 03/14/15   Abigail Harris, PA-C  ibuprofen (ADVIL) 200 MG tablet Take 1-2 tablets (200-400 mg total) by mouth every 8 (eight) hours as needed for moderate pain. 02/01/15   Aleksei Plotnikov V, MD  methocarbamol (ROBAXIN) 500 MG tablet Take 1 tablet (500 mg total) by mouth  every 8 (eight) hours as needed for muscle spasms. 11/28/14   Aleksei Plotnikov V, MD  pravastatin (PRAVACHOL) 40 MG tablet TAKE 1 TABLET DAILY 01/19/15   Aleksei Plotnikov V, MD     Vital Signs: BP 155/75 mmHg  Pulse 87  Temp(Src) 98.4 F (36.9 C) (Oral)  Resp 16  SpO2 99%  Physical Exam  Constitutional: She is oriented to person, place, and time. No distress.  HENT:  Head: Normocephalic and atraumatic.  Cardiovascular: Normal rate and regular rhythm.  Exam reveals no gallop and no friction rub.   No murmur heard. Pulmonary/Chest: Effort normal and breath sounds normal. No respiratory distress. She has no wheezes. She has no rales.  Abdominal: Soft. Bowel sounds are normal. She exhibits no distension. There is no tenderness.  Neurological: She is alert and oriented to person, place, and time.  Skin: Skin is warm and dry. She is not diaphoretic.    Mallampati Score:  MD Evaluation Airway: WNL Heart: WNL Abdomen: WNL Chest/ Lungs: WNL ASA  Classification: 2 Mallampati/Airway Score: Two  Labs:  CBC:  Recent Labs  11/22/14 0941 03/01/15 1448 03/14/15 1315 03/16/15 0845  WBC 9.7 5.6 7.9 6.7  HGB 12.0 10.4* 10.9* 10.4*  HCT 36.3 31.6* 33.0* 31.9*  PLT 235.0 207 221 243    COAGS:  Recent Labs  03/16/15 0845  INR 1.04  APTT 34    BMP:  Recent Labs    11/22/14 0941 12/01/14 0819 03/01/15 1448 03/14/15 1315  NA 140 139 141 138  K 3.8 3.5 3.1* 3.0*  CL 102 104 105 105  CO2 30 26 29 29  GLUCOSE 114* 104* 92 107*  BUN 12 19 10 11  CALCIUM 9.8 9.6 8.9 8.6*  CREATININE 0.93 0.93 0.6 0.61  GFRNONAA  --   --   --  >60  GFRAA  --   --   --  >60    LIVER FUNCTION TESTS:  Recent Labs  03/01/15 1448  BILITOT 1.40  AST 30  ALT 26  ALKPHOS 67  PROT 7.2  ALBUMIN 3.3    Assessment/Plan:  Monoclonal spike Seen by Dr. Ennever on 03/01/15 Scheduled today for image guided bone marrow biopsy with sedation The patient has been NPO, no blood thinners taken,  labs and vitals have been reviewed. Risks and Benefits discussed with the patient including, but not limited to bleeding, infection, damage to adjacent structures or low yield requiring additional tests. All of the patient's questions were answered, patient is agreeable to proceed. Consent signed and in chart.   Signed: ,  D 03/16/2015, 9:34 AM   

## 2015-03-16 NOTE — Discharge Instructions (Signed)
Bone Marrow Aspiration and Bone Marrow Biopsy, Care After °Refer to this sheet in the next few weeks. These instructions provide you with information about caring for yourself after your procedure. Your health care provider may also give you more specific instructions. Your treatment has been planned according to current medical practices, but problems sometimes occur. Call your health care provider if you have any problems or questions after your procedure. °WHAT TO EXPECT AFTER THE PROCEDURE °After your procedure, it is common to have: °· Soreness or tenderness around the puncture site. °· Bruising. °HOME CARE INSTRUCTIONS °· Take medicines only as directed by your health care provider. °· Follow your health care provider's instructions about: °¨ Puncture site care. °¨ Bandage (dressing) changes and removal. °· Bathe and shower as directed by your health care provider. °· Check your puncture site every day for signs of infection. Watch for: °¨ Redness, swelling, or pain. °¨ Fluid, blood, or pus. °· Return to your normal activities as directed by your health care provider. °· Keep all follow-up visits as directed by your health care provider. This is important. °SEEK MEDICAL CARE IF: °· You have a fever. °· You have uncontrollable bleeding. °· You have redness, swelling, or pain at the site of your puncture. °· You have fluid, blood, or pus coming from your puncture site. °  °This information is not intended to replace advice given to you by your health care provider. Make sure you discuss any questions you have with your health care provider. °  °Document Released: 11/29/2004 Document Revised: 09/26/2014 Document Reviewed: 05/03/2014 °Elsevier Interactive Patient Education ©2016 Elsevier Inc[employer] °Bone Marrow Aspiration and Bone Marrow Biopsy °Bone marrow aspiration and bone marrow biopsy are procedures that are done to diagnose blood disorders. You may also have one of these procedures to help diagnose infections or  some types of cancer. °Bone marrow is the soft tissue that is inside your bones. Blood cells are produced in bone marrow. For bone marrow aspiration, a sample of tissue in liquid form is removed from inside your bone. For a bone marrow biopsy, a small core of bone marrow tissue is removed. Then these samples are examined under a microscope or tested in a lab. °You may need these procedures if you have an abnormal complete blood count (CBC). The aspiration or biopsy sample is usually taken from the top of your hip bone. Sometimes, an aspiration sample is taken from your chest bone (sternum). °LET YOUR HEALTH CARE PROVIDER KNOW ABOUT: °· Any allergies you have. °· All medicines you are taking, including vitamins, herbs, eye drops, creams, and over-the-counter medicines. °· Previous problems you or members of your family have had with the use of anesthetics. °· Any blood disorders you have. °· Previous surgeries you have had. °· Any medical conditions you may have. °· Whether you are pregnant or you think that you may be pregnant. °RISKS AND COMPLICATIONS °Generally, this is a safe procedure. However, problems may occur, including: °· Infection. °· Bleeding. °BEFORE THE PROCEDURE °· Ask your health care provider about: °· Changing or stopping your regular medicines. This is especially important if you are taking diabetes medicines or blood thinners. °· Taking medicines such as aspirin and ibuprofen. These medicines can thin your blood. Do not take these medicines before your procedure if your health care provider instructs you not to. °· Plan to have someone take you home after the procedure. °· If you go home right after the procedure, plan to have someone with you   for 24 hours. PROCEDURE   An IV tube may be inserted into one of your veins.  The injection site will be cleaned with a germ-killing solution (antiseptic).  You will be given one or more of the following:  A medicine that helps you relax  (sedative).  A medicine that numbs the area (local anesthetic).  The bone marrow sample will be removed as follows:  For an aspiration, a hollow needle will be inserted through your skin and into your bone. Bone marrow fluid will be drawn up into a syringe.  For a biopsy, your health care provider will use a hollow needle to remove a core of tissue from your bone marrow.  The needle will be removed.  A bandage (dressing) will be placed over the insertion site and taped in place. The procedure may vary among health care providers and hospitals. AFTER THE PROCEDURE  Your blood pressure, heart rate, breathing rate, and blood oxygen level will be monitored often until the medicines you were given have worn off.  Return to your normal activities as directed by your health care provider.   This information is not intended to replace advice given to you by your health care provider. Make sure you discuss any questions you have with your health care provider.   Document Released: 05/15/2004 Document Revised: 09/26/2014 Document Reviewed: 05/03/2014 Elsevier Interactive Patient Education 2016 Elsevier Inc. Moderate Conscious Sedation, Adult Sedation is the use of medicines to promote relaxation and relieve discomfort and anxiety. Moderate conscious sedation is a type of sedation. Under moderate conscious sedation you are less alert than normal but are still able to respond to instructions or stimulation. Moderate conscious sedation is used during short medical and dental procedures. It is milder than deep sedation or general anesthesia and allows you to return to your regular activities sooner. LET [facility] CARE PROVIDER KNOW ABOUT:   Any allergies you have.  All medicines you are taking, including vitamins, herbs, eye drops, creams, and over-the-counter medicines.  Use of steroids (by mouth or creams).  Previous problems you or members of your family have had with the use of  anesthetics.  Any blood disorders you have.  Previous surgeries you have had.  Medical conditions you have.  Possibility of pregnancy, if this applies.  Use of cigarettes, alcohol, or illegal drugs. RISKS AND COMPLICATIONS Generally, this is a safe procedure. However, as with any procedure, problems can occur. Possible problems include:  Oversedation.  Trouble breathing on your own. You may need to have a breathing tube until you are awake and breathing on your own.  Allergic reaction to any of the medicines used for the procedure. BEFORE THE PROCEDURE  You may have blood tests done. These tests can help show how well your kidneys and liver are working. They can also show how well your blood clots.  A physical exam will be done.  Only take medicines as directed by your health care provider. You may need to stop taking medicines (such as blood thinners, aspirin, or nonsteroidal anti-inflammatory drugs) before the procedure.   Do not eat or drink at least 6 hours before the procedure or as directed by your health care provider.  Arrange for a responsible adult, family member, or friend to take you home after the procedure. He or she should stay with you for at least 24 hours after the procedure, until the medicine has worn off. PROCEDURE   An intravenous (IV) catheter will be inserted into one of your  veins. Medicine will be able to flow directly into your body through this catheter. You may be given medicine through this tube to help prevent pain and help you relax.  The medical or dental procedure will be done. AFTER THE PROCEDURE  You will stay in a recovery area until the medicine has worn off. Your blood pressure and pulse will be checked.   Depending on the procedure you had, you may be allowed to go home when you can tolerate liquids and your pain is under control.   This information is not intended to replace advice given to you by your health care provider. Make  sure you discuss any questions you have with your health care provider.   Document Released: 02/04/2001 Document Revised: 06/02/2014 Document Reviewed: 01/17/2013 Elsevier Interactive Patient Education [employer].

## 2015-03-26 LAB — CHROMOSOME ANALYSIS, BONE MARROW

## 2015-03-26 LAB — TISSUE HYBRIDIZATION (BONE MARROW)-NCBH

## 2015-04-02 ENCOUNTER — Telehealth: Payer: Self-pay | Admitting: Internal Medicine

## 2015-04-02 ENCOUNTER — Encounter (HOSPITAL_COMMUNITY): Payer: Self-pay

## 2015-04-02 ENCOUNTER — Other Ambulatory Visit: Payer: Self-pay | Admitting: Hematology & Oncology

## 2015-04-02 DIAGNOSIS — B351 Tinea unguium: Secondary | ICD-10-CM

## 2015-04-02 MED ORDER — CEPHALEXIN 500 MG PO CAPS: 500.0000 mg | ORAL_CAPSULE | Freq: Two times a day (BID) | ORAL | Status: DC

## 2015-04-03 ENCOUNTER — Other Ambulatory Visit: Payer: Self-pay | Admitting: Hematology & Oncology

## 2015-04-03 ENCOUNTER — Encounter: Payer: Self-pay | Admitting: Hematology & Oncology

## 2015-04-03 DIAGNOSIS — C9000 Multiple myeloma not having achieved remission: Secondary | ICD-10-CM

## 2015-04-03 HISTORY — DX: Multiple myeloma not having achieved remission: C90.00

## 2015-04-04 ENCOUNTER — Ambulatory Visit (HOSPITAL_BASED_OUTPATIENT_CLINIC_OR_DEPARTMENT_OTHER): Payer: Medicare Other

## 2015-04-04 ENCOUNTER — Encounter: Payer: Self-pay | Admitting: Hematology & Oncology

## 2015-04-04 ENCOUNTER — Ambulatory Visit (HOSPITAL_BASED_OUTPATIENT_CLINIC_OR_DEPARTMENT_OTHER): Payer: Medicare Other | Admitting: Hematology & Oncology

## 2015-04-04 VITALS — BP 142/77 | HR 81 | Temp 97.6°F | Resp 18 | Ht 67.0 in | Wt 224.0 lb

## 2015-04-04 DIAGNOSIS — C9000 Multiple myeloma not having achieved remission: Secondary | ICD-10-CM

## 2015-04-04 DIAGNOSIS — B029 Zoster without complications: Secondary | ICD-10-CM

## 2015-04-04 LAB — CBC WITH DIFFERENTIAL (CANCER CENTER ONLY)
BASO#: 0 10*3/uL (ref 0.0–0.2)
BASO%: 0.2 % (ref 0.0–2.0)
EOS ABS: 0.1 10*3/uL (ref 0.0–0.5)
EOS%: 1.7 % (ref 0.0–7.0)
HEMATOCRIT: 33.6 % — AB (ref 34.8–46.6)
HEMOGLOBIN: 11 g/dL — AB (ref 11.6–15.9)
LYMPH#: 2.1 10*3/uL (ref 0.9–3.3)
LYMPH%: 40.8 % (ref 14.0–48.0)
MCH: 30.4 pg (ref 26.0–34.0)
MCHC: 32.7 g/dL (ref 32.0–36.0)
MCV: 93 fL (ref 81–101)
MONO#: 0.6 10*3/uL (ref 0.1–0.9)
MONO%: 11.5 % (ref 0.0–13.0)
NEUT%: 45.8 % (ref 39.6–80.0)
NEUTROS ABS: 2.4 10*3/uL (ref 1.5–6.5)
Platelets: 206 10*3/uL (ref 145–400)
RBC: 3.62 10*6/uL — ABNORMAL LOW (ref 3.70–5.32)
RDW: 13.7 % (ref 11.1–15.7)
WBC: 5.2 10*3/uL (ref 3.9–10.0)

## 2015-04-04 LAB — COMPREHENSIVE METABOLIC PANEL (CC13)
ALBUMIN: 3.3 g/dL — AB (ref 3.5–5.0)
ALK PHOS: 86 U/L (ref 40–150)
ALT: 21 U/L (ref 0–55)
AST: 28 U/L (ref 5–34)
Anion Gap: 7 mEq/L (ref 3–11)
BILIRUBIN TOTAL: 0.64 mg/dL (ref 0.20–1.20)
BUN: 14.8 mg/dL (ref 7.0–26.0)
CALCIUM: 9.3 mg/dL (ref 8.4–10.4)
CO2: 24 mEq/L (ref 22–29)
Chloride: 107 mEq/L (ref 98–109)
Creatinine: 0.8 mg/dL (ref 0.6–1.1)
EGFR: 79 mL/min/{1.73_m2} — AB (ref >90)
GLUCOSE: 89 mg/dL (ref 70–140)
Potassium: 4.2 mEq/L (ref 3.5–5.1)
SODIUM: 137 meq/L (ref 136–145)
TOTAL PROTEIN: 8.3 g/dL (ref 6.4–8.3)

## 2015-04-04 LAB — LACTATE DEHYDROGENASE (CC13): LDH: 216 U/L (ref 125–245)

## 2015-04-04 MED ORDER — FAMCICLOVIR 500 MG PO TABS: ORAL_TABLET | ORAL | Status: DC

## 2015-04-04 MED ORDER — LENALIDOMIDE 25 MG PO CAPS: ORAL_CAPSULE | ORAL | Status: DC

## 2015-04-04 NOTE — Progress Notes (Signed)
Hematology and Oncology Follow Up Visit  Mindy Taylor [id number] 1928/09/27 81 y.o. 04/04/2015   Principle Diagnosis:   IgG Lambda myeloma -- 11q- and t(11:14)  Current Therapy:    Observation     Interim History:  Mindy Taylor is back for her follow-up. We first saw her back in October. At that point in time, her labs show that she had a monoclonal protein. She had an IgG lambda protein. She had a M spike of 1.5 g/dL. Her IgG level was 2300 mg/dL. Her lambda light chain was 1.43 mg/dL.  We did do a bone survey on her. This was negative for any obvious lytic bone lesions.  She then had a bone marrow biopsy done. This was done on October 21. The pathology report ([id number]) showed 20% plasma cells in the bone marrow. It was lambda light chain restricted.  Surprisingly enough however, was the fact that she had 2 chromosomal abnormalities. These were noted on FISH. She had an 11q- and t(11:14) abnormalities. I think that this puts her in a more aggressive category.  She now appears to have shingles. She has vesicular lesions in the left upper chest and left upper inner arm. This appears to be about the T2 dermatome. There appear to be drying up but we will have to go ahead and get her on Famvir.  She just does not feel all that great. Again a lot of this could be from myeloma.  She comes in with her sister. We talked quite a bit about how we try to help with this. I think she clearly will need treatment.  She's had no problems with cough. She's had no fever. She had no bleeding. She has had no change in bowel or bladder habits. She has had no leg swelling. She has had no rashes, outside of that associate with shingles. She has had no headache.  Overall, her performance status is ECOG 1-2.  Medications:  Current outpatient prescriptions:  .  amLODipine (NORVASC) 5 MG tablet, TAKE 1 TABLET DAILY, Disp: 90 tablet, Rfl: 3 .  aspirin 81 MG tablet, Take 81 mg by mouth every evening. ,  Disp: , Rfl:  .  cephALEXin (KEFLEX) 500 MG capsule, Take 1 capsule (500 mg total) by mouth 2 (two) times daily., Disp: 20 capsule, Rfl: 2 .  Cholecalciferol (VITAMIN D) 1000 UNITS capsule, Take 1 capsule (1,000 Units total) by mouth daily., Disp: 100 capsule, Rfl: 3 .  doxycycline (VIBRA-TABS) 100 MG tablet, Take 1 tablet (100 mg total) by mouth 2 (two) times daily., Disp: 20 tablet, Rfl: 0 .  fish oil-omega-3 fatty acids 1000 MG capsule, Take 1 g by mouth daily.  , Disp: , Rfl:  .  HYDROcodone-acetaminophen (NORCO/VICODIN) 5-325 MG tablet, Take 1 tablet by mouth every 6 (six) hours as needed for moderate pain., Disp: 20 tablet, Rfl: 0 .  ibuprofen (ADVIL) 200 MG tablet, Take 1-2 tablets (200-400 mg total) by mouth every 8 (eight) hours as needed for moderate pain., Disp: 30 tablet, Rfl: 0 .  losartan (COZAAR) 100 MG tablet, Take 1 tablet (100 mg total) by mouth every evening., Disp: 90 tablet, Rfl: 3 .  methocarbamol (ROBAXIN) 500 MG tablet, Take 1 tablet (500 mg total) by mouth every 8 (eight) hours as needed for muscle spasms., Disp: 30 tablet, Rfl: 1 .  metoprolol succinate (TOPROL-XL) 50 MG 24 hr tablet, TAKE 1 TABLET TWICE A DAY, Disp: 180 tablet, Rfl: 3 .  Multiple Vitamin (MULTIVITAMIN) tablet, Take 1 tablet  by mouth daily.  , Disp: , Rfl:  .  pravastatin (PRAVACHOL) 40 MG tablet, TAKE 1 TABLET DAILY, Disp: 90 tablet, Rfl: 2 .  vitamin C (ASCORBIC ACID) 500 MG tablet, Take 500 mg by mouth daily.  , Disp: , Rfl:  .  famciclovir (FAMVIR) 500 MG tablet, Please take 1 pill 3 times a day for 7 days and then take 1 pill a day indefinitely., Disp: 30 tablet, Rfl: 12 .  lenalidomide (REVLIMID) 25 MG capsule, Take 1 capsule daily for 21 days and then off for 7 days., Disp: 21 capsule, Rfl: 6  Allergies:  Allergies  Allergen Reactions  . Bee Venom Swelling  . Ceftin [Cefuroxime Axetil]     Felt wierd  . Spider Antivenin [Antivenin Latrodectus Mactans] Other (See Comments)    pain    Past  Medical History, Surgical history, Social history, and Family History were reviewed and updated.  Review of Systems: As above  Physical Exam:  height is $RemoveB'5\' 7"'hFkNCYYt$  (1.702 m) and weight is 224 lb (101.606 kg). Her oral temperature is 97.6 F (36.4 C). Her blood pressure is 142/77 and her pulse is 81. Her respiration is 18.   Wt Readings from Last 3 Encounters:  04/04/15 224 lb (101.606 kg)  03/14/15 230 lb (104.327 kg)  03/01/15 230 lb (104.327 kg)     Elderly but fairly well-nourished African-American female in no obvious distress. Head and neck exam shows no ocular or oral lesions. She has no palpable cervical or supraclavicular lymph nodes. Lungs are clear bilaterally. Cardiac exam regular rate and rhythm with no murmurs, rubs or bruits. Abdomen is soft. She has good bowel sounds. There is no fluid wave. There is no palpable liver or spleen tip. Back exam shows no tenderness over the spine, ribs or hips. She has no kyphosis. Skin exam shows the vesicular lesions in the left T2 dermatome. Neurological exam shows no focal neurological deficits.  Lab Results  Component Value Date   WBC 5.2 04/04/2015   HGB 11.0* 04/04/2015   HCT 33.6* 04/04/2015   MCV 93 04/04/2015   PLT 206 04/04/2015     Chemistry      Component Value Date/Time   NA 137 04/04/2015 1131   NA 138 03/14/2015 1315   NA 141 03/01/2015 1448   K 4.2 04/04/2015 1131   K 3.0* 03/14/2015 1315   K 3.1* 03/01/2015 1448   CL 105 03/14/2015 1315   CL 105 03/01/2015 1448   CO2 24 04/04/2015 1131   CO2 29 03/14/2015 1315   CO2 29 03/01/2015 1448   BUN 14.8 04/04/2015 1131   BUN 11 03/14/2015 1315   BUN 10 03/01/2015 1448   CREATININE 0.8 04/04/2015 1131   CREATININE 0.61 03/14/2015 1315   CREATININE 0.6 03/01/2015 1448      Component Value Date/Time   CALCIUM 9.3 04/04/2015 1131   CALCIUM 8.6* 03/14/2015 1315   CALCIUM 8.9 03/01/2015 1448   CALCIUM 9.5 10/02/2011 1601   ALKPHOS 86 04/04/2015 1131   ALKPHOS 67  03/01/2015 1448   ALKPHOS 78 09/06/2012 0900   AST 28 04/04/2015 1131   AST 30 03/01/2015 1448   AST 14 09/06/2012 0900   ALT 21 04/04/2015 1131   ALT 26 03/01/2015 1448   ALT 12 09/06/2012 0900   BILITOT 0.64 04/04/2015 1131   BILITOT 1.40 03/01/2015 1448   BILITOT 1.2 09/06/2012 0900         Impression and Plan: Ms. Fruchter is a 81 year old African-American  female. She certainly looks younger. She has IgG lambda myeloma. She has 2 chromosomal abnormalities by FISH.  I think we have to go ahead and get her started on treatment. I think she would be a good candidate for Velcade/Revlimid.  Given her age, I think we can probably go ahead and omit Decadron. She is not a transplant candidate. Steroids, at her age, might be able bit tough. She has no bony lesions so we probably can forego Zometa.  I spent about 45 minutes with she and her sister. I gave him copies of the bone marrow report.  I believe that we have a very good chance of getting her into a very good partial remission.  She does not want to start treatment until after Thanksgiving. I think this would be reasonable so we can get the shingles treated.  I told her that she needs take aspirin to try to help prevent thromboembolic disease from the Revlimid. She will take a full dose aspirin.  I will put her on Famvir at 500 mg by mouth 3 times a day. She'll be on this for 7 days and then go onto 500 mg daily as a prophylactic dose.  I went over some of the side effects with treatment. I gave them information sheets about Revlimid and Velcade.  I will plan to get her started the week after Thanksgiving. She'll have the Velcade 3 weeks on and 1 week off.   I will plan to see her back for cycle #2.  [name], MD 11/9/20165:29 PM

## 2015-04-06 ENCOUNTER — Other Ambulatory Visit: Payer: Self-pay | Admitting: *Deleted

## 2015-04-06 DIAGNOSIS — C9000 Multiple myeloma not having achieved remission: Secondary | ICD-10-CM

## 2015-04-06 LAB — PROTEIN ELECTROPHORESIS, SERUM, WITH REFLEX
ABNORMAL PROTEIN BAND1: 1.7 g/dL
Albumin ELP: 3.6 g/dL — ABNORMAL LOW (ref 3.8–4.8)
Alpha-1-Globulin: 0.5 g/dL — ABNORMAL HIGH (ref 0.2–0.3)
Alpha-2-Globulin: 1 g/dL — ABNORMAL HIGH (ref 0.5–0.9)
BETA GLOBULIN: 0.5 g/dL (ref 0.4–0.6)
Beta 2: 0.4 g/dL (ref 0.2–0.5)
GAMMA GLOBULIN: 2.1 g/dL — AB (ref 0.8–1.7)
TOTAL PROTEIN, SERUM ELECTROPHOR: 8 g/dL (ref 6.1–8.1)

## 2015-04-06 LAB — IFE INTERPRETATION

## 2015-04-06 LAB — IGG, IGA, IGM
IGA: 99 mg/dL (ref 69–380)
IGG (IMMUNOGLOBIN G), SERUM: 3210 mg/dL — AB (ref 690–1700)
IgM, Serum: 46 mg/dL — ABNORMAL LOW (ref 52–322)

## 2015-04-06 LAB — KAPPA/LAMBDA LIGHT CHAINS
Kappa free light chain: 4.74 mg/dL — ABNORMAL HIGH (ref 0.33–1.94)
Kappa:Lambda Ratio: 1.87 — ABNORMAL HIGH (ref 0.26–1.65)
LAMBDA FREE LGHT CHN: 2.53 mg/dL (ref 0.57–2.63)

## 2015-04-06 LAB — BETA 2 MICROGLOBULIN, SERUM: BETA 2 MICROGLOBULIN: 4.55 mg/L — AB (ref <=2.51)

## 2015-04-06 MED ORDER — LENALIDOMIDE 25 MG PO CAPS: ORAL_CAPSULE | ORAL | Status: DC

## 2015-04-09 ENCOUNTER — Other Ambulatory Visit: Payer: Self-pay | Admitting: *Deleted

## 2015-04-09 DIAGNOSIS — C9000 Multiple myeloma not having achieved remission: Secondary | ICD-10-CM

## 2015-04-09 MED ORDER — LENALIDOMIDE 25 MG PO CAPS: ORAL_CAPSULE | ORAL | Status: DC

## 2015-04-10 ENCOUNTER — Telehealth: Payer: Self-pay | Admitting: *Deleted

## 2015-04-10 NOTE — Telephone Encounter (Signed)
Received call from [facility]. They stated that they attempted to schedule delivery of the patient's Revlimid prescription. Patient refused delivery stating that she had decided that she did not want to take it. The pharmacy states the patient was certain in her decision and they are voiding the prescription. Dr Marin Olp has been notified.

## 2015-04-16 DIAGNOSIS — C9000 Multiple myeloma not having achieved remission: Secondary | ICD-10-CM | POA: Diagnosis not present

## 2015-04-16 DIAGNOSIS — R21 Rash and other nonspecific skin eruption: Secondary | ICD-10-CM | POA: Diagnosis not present

## 2015-04-16 DIAGNOSIS — R768 Other specified abnormal immunological findings in serum: Secondary | ICD-10-CM | POA: Diagnosis not present

## 2015-04-16 DIAGNOSIS — M2550 Pain in unspecified joint: Secondary | ICD-10-CM | POA: Diagnosis not present

## 2015-04-20 ENCOUNTER — Telehealth: Payer: Self-pay | Admitting: *Deleted

## 2015-04-20 NOTE — Telephone Encounter (Signed)
Patient has decided she wants no treatment. She states she is 81 years old and doesn't want to take any more medications and she is refusing both the oral and injectable treatment. She wants all appointments cancelled including the follow ups with Dr Marin Olp. She states she will call if she needs something from [place]. All appointments cancelled. Dr Marin Olp aware.

## 2015-04-23 ENCOUNTER — Inpatient Hospital Stay: Payer: Medicare Other

## 2015-04-23 ENCOUNTER — Other Ambulatory Visit: Payer: Medicare Other

## 2015-04-30 ENCOUNTER — Other Ambulatory Visit: Payer: Medicare Other

## 2015-04-30 ENCOUNTER — Inpatient Hospital Stay: Payer: Medicare Other

## 2015-05-07 ENCOUNTER — Other Ambulatory Visit: Payer: Medicare Other

## 2015-05-07 ENCOUNTER — Inpatient Hospital Stay: Payer: Medicare Other

## 2015-05-07 ENCOUNTER — Ambulatory Visit (INDEPENDENT_AMBULATORY_CARE_PROVIDER_SITE_OTHER): Payer: Medicare Other | Admitting: Internal Medicine

## 2015-05-07 ENCOUNTER — Encounter: Payer: Self-pay | Admitting: Internal Medicine

## 2015-05-07 ENCOUNTER — Other Ambulatory Visit (INDEPENDENT_AMBULATORY_CARE_PROVIDER_SITE_OTHER): Payer: Medicare Other

## 2015-05-07 VITALS — BP 146/80 | HR 87 | Wt 221.0 lb

## 2015-05-07 DIAGNOSIS — C9000 Multiple myeloma not having achieved remission: Secondary | ICD-10-CM

## 2015-05-07 DIAGNOSIS — M2550 Pain in unspecified joint: Secondary | ICD-10-CM | POA: Diagnosis not present

## 2015-05-07 DIAGNOSIS — M79641 Pain in right hand: Secondary | ICD-10-CM

## 2015-05-07 DIAGNOSIS — M79609 Pain in unspecified limb: Secondary | ICD-10-CM | POA: Diagnosis not present

## 2015-05-07 DIAGNOSIS — D649 Anemia, unspecified: Secondary | ICD-10-CM

## 2015-05-07 DIAGNOSIS — F411 Generalized anxiety disorder: Secondary | ICD-10-CM | POA: Diagnosis not present

## 2015-05-07 DIAGNOSIS — I6350 Cerebral infarction due to unspecified occlusion or stenosis of unspecified cerebral artery: Secondary | ICD-10-CM

## 2015-05-07 LAB — CBC WITH DIFFERENTIAL/PLATELET
BASOS PCT: 0.2 % (ref 0.0–3.0)
Basophils Absolute: 0 10*3/uL (ref 0.0–0.1)
EOS ABS: 0.1 10*3/uL (ref 0.0–0.7)
Eosinophils Relative: 1.4 % (ref 0.0–5.0)
HEMATOCRIT: 32.2 % — AB (ref 36.0–46.0)
Hemoglobin: 10.6 g/dL — ABNORMAL LOW (ref 12.0–15.0)
LYMPHS ABS: 2.3 10*3/uL (ref 0.7–4.0)
LYMPHS PCT: 39.8 % (ref 12.0–46.0)
MCHC: 33 g/dL (ref 30.0–36.0)
MCV: 92.5 fl (ref 78.0–100.0)
MONO ABS: 0.5 10*3/uL (ref 0.1–1.0)
Monocytes Relative: 8.3 % (ref 3.0–12.0)
NEUTROS ABS: 2.9 10*3/uL (ref 1.4–7.7)
NEUTROS PCT: 50.3 % (ref 43.0–77.0)
PLATELETS: 254 10*3/uL (ref 150.0–400.0)
RBC: 3.48 Mil/uL — ABNORMAL LOW (ref 3.87–5.11)
RDW: 14.5 % (ref 11.5–15.5)
WBC: 5.8 10*3/uL (ref 4.0–10.5)

## 2015-05-07 LAB — SEDIMENTATION RATE: SED RATE: 102 mm/h — AB (ref 0–22)

## 2015-05-07 LAB — BASIC METABOLIC PANEL
BUN: 11 mg/dL (ref 6–23)
CALCIUM: 9.5 mg/dL (ref 8.4–10.5)
CO2: 30 meq/L (ref 19–32)
CREATININE: 0.7 mg/dL (ref 0.40–1.20)
Chloride: 104 mEq/L (ref 96–112)
GFR: 102.29 mL/min (ref >60.00)
Glucose, Bld: 100 mg/dL — ABNORMAL HIGH (ref 70–99)
Potassium: 3.7 mEq/L (ref 3.5–5.1)
SODIUM: 139 meq/L (ref 135–145)

## 2015-05-07 LAB — HEPATIC FUNCTION PANEL
ALT: 12 U/L (ref 0–35)
AST: 15 U/L (ref 0–37)
Albumin: 3.8 g/dL (ref 3.5–5.2)
Alkaline Phosphatase: 72 U/L (ref 39–117)
BILIRUBIN DIRECT: 0.1 mg/dL (ref 0.0–0.3)
TOTAL PROTEIN: 8.2 g/dL (ref 6.0–8.3)
Total Bilirubin: 0.6 mg/dL (ref 0.2–1.2)

## 2015-05-07 NOTE — Assessment & Plan Note (Addendum)
2016 Dr Marin Olp She has IgG lambda myeloma. She has 2 chromosomal abnormalities by FISH. Pt is refusing Revlimid 12/16 - she is thinking of ignoring her MM situation. I advised Ms Ogletree to think her situation over again and to f/u w/Dr [employer]

## 2015-05-07 NOTE — Assessment & Plan Note (Signed)
2016 Dr Marin Olp She has IgG lambda myeloma. She has 2 chromosomal abnormalities by FISH. Pt is refusing Revlimid 12/16

## 2015-05-07 NOTE — Assessment & Plan Note (Signed)
Chronic  Discussed  - sheis not willing to get MM treatment

## 2015-05-07 NOTE — Assessment & Plan Note (Signed)
10/16  Dr Marin Olp She has IgG lambda myeloma. She has 2 chromosomal abnormalities by FISH. Pt is refusing Revlimid 12/16

## 2015-05-07 NOTE — Progress Notes (Signed)
Pre visit review using our clinic review tool, if applicable. No additional management support is needed unless otherwise documented below in the visit note. 

## 2015-05-08 LAB — ANTI-NUCLEAR AB-TITER (ANA TITER): ANA Titer 1: 1:40 {titer} — ABNORMAL HIGH

## 2015-05-08 LAB — RHEUMATOID FACTOR: Rhuematoid fact SerPl-aCnc: <10 IU/mL (ref <=14)

## 2015-05-08 LAB — ANA: ANA: POSITIVE — AB

## 2015-05-09 ENCOUNTER — Telehealth: Payer: Self-pay | Admitting: *Deleted

## 2015-05-09 ENCOUNTER — Other Ambulatory Visit: Payer: Self-pay | Admitting: *Deleted

## 2015-05-09 ENCOUNTER — Encounter: Payer: Self-pay | Admitting: Hematology & Oncology

## 2015-05-09 NOTE — Telephone Encounter (Signed)
Patient has decided she wants to try treatment. She had earlier refused all treatment and everything was cancelled. She's now changed her mind. Spoke to Dr Marin Olp who wants patient to come in after Christmas to speak with her, and then resume the treatment plan. Spoke with patient and she's in agreement. Message sent to scheduler

## 2015-05-10 ENCOUNTER — Encounter: Payer: Self-pay | Admitting: Hematology & Oncology

## 2015-05-23 ENCOUNTER — Ambulatory Visit: Payer: Medicare Other | Admitting: Hematology & Oncology

## 2015-05-23 ENCOUNTER — Inpatient Hospital Stay: Payer: Medicare Other

## 2015-05-23 ENCOUNTER — Other Ambulatory Visit: Payer: Medicare Other

## 2015-05-30 ENCOUNTER — Other Ambulatory Visit: Payer: Medicare Other

## 2015-05-30 ENCOUNTER — Inpatient Hospital Stay: Payer: Medicare Other

## 2015-05-31 ENCOUNTER — Other Ambulatory Visit: Payer: Self-pay | Admitting: *Deleted

## 2015-05-31 DIAGNOSIS — C9000 Multiple myeloma not having achieved remission: Secondary | ICD-10-CM

## 2015-06-01 ENCOUNTER — Other Ambulatory Visit: Payer: Self-pay | Admitting: *Deleted

## 2015-06-01 ENCOUNTER — Encounter: Payer: Self-pay | Admitting: Hematology & Oncology

## 2015-06-01 ENCOUNTER — Other Ambulatory Visit (HOSPITAL_BASED_OUTPATIENT_CLINIC_OR_DEPARTMENT_OTHER): Payer: Medicare Other

## 2015-06-01 ENCOUNTER — Ambulatory Visit (HOSPITAL_BASED_OUTPATIENT_CLINIC_OR_DEPARTMENT_OTHER): Payer: Medicare Other | Admitting: Hematology & Oncology

## 2015-06-01 VITALS — BP 155/73 | HR 84 | Temp 97.9°F | Resp 18 | Ht 67.0 in | Wt 215.0 lb

## 2015-06-01 DIAGNOSIS — C9000 Multiple myeloma not having achieved remission: Secondary | ICD-10-CM | POA: Diagnosis not present

## 2015-06-01 DIAGNOSIS — R634 Abnormal weight loss: Secondary | ICD-10-CM

## 2015-06-01 LAB — CBC WITH DIFFERENTIAL (CANCER CENTER ONLY)
BASO#: 0 10*3/uL (ref 0.0–0.2)
BASO%: 0.2 % (ref 0.0–2.0)
EOS ABS: 0.1 10*3/uL (ref 0.0–0.5)
EOS%: 1.7 % (ref 0.0–7.0)
HCT: 31.8 % — ABNORMAL LOW (ref 34.8–46.6)
HEMOGLOBIN: 10.4 g/dL — AB (ref 11.6–15.9)
LYMPH#: 1.8 10*3/uL (ref 0.9–3.3)
LYMPH%: 34.4 % (ref 14.0–48.0)
MCH: 30.4 pg (ref 26.0–34.0)
MCHC: 32.7 g/dL (ref 32.0–36.0)
MCV: 93 fL (ref 81–101)
MONO#: 0.5 10*3/uL (ref 0.1–0.9)
MONO%: 9.3 % (ref 0.0–13.0)
NEUT%: 54.4 % (ref 39.6–80.0)
NEUTROS ABS: 2.8 10*3/uL (ref 1.5–6.5)
PLATELETS: 214 10*3/uL (ref 145–400)
RBC: 3.42 10*6/uL — AB (ref 3.70–5.32)
RDW: 14.9 % (ref 11.1–15.7)
WBC: 5.2 10*3/uL (ref 3.9–10.0)

## 2015-06-01 LAB — COMPREHENSIVE METABOLIC PANEL
ALBUMIN: 3.6 g/dL (ref 3.5–5.0)
ALK PHOS: 74 U/L (ref 40–150)
ALT: 13 U/L (ref 0–55)
ANION GAP: 9 meq/L (ref 3–11)
AST: 19 U/L (ref 5–34)
BUN: 14.7 mg/dL (ref 7.0–26.0)
CALCIUM: 8.8 mg/dL (ref 8.4–10.4)
CHLORIDE: 109 meq/L (ref 98–109)
CO2: 23 mEq/L (ref 22–29)
Creatinine: 0.8 mg/dL (ref 0.6–1.1)
EGFR: 75 mL/min/{1.73_m2} — AB (ref >90)
Glucose: 91 mg/dl (ref 70–140)
POTASSIUM: 3.6 meq/L (ref 3.5–5.1)
Sodium: 141 mEq/L (ref 136–145)
Total Bilirubin: 0.83 mg/dL (ref 0.20–1.20)
Total Protein: 8.1 g/dL (ref 6.4–8.3)

## 2015-06-01 LAB — LACTATE DEHYDROGENASE: LDH: 204 U/L (ref 125–245)

## 2015-06-01 MED ORDER — ONDANSETRON HCL 4 MG PO TABS: 4.0000 mg | ORAL_TABLET | Freq: Three times a day (TID) | ORAL | Status: DC | PRN

## 2015-06-01 MED ORDER — LENALIDOMIDE 20 MG PO CAPS: 20.0000 mg | ORAL_CAPSULE | Freq: Every day | ORAL | Status: DC

## 2015-06-01 MED ORDER — LENALIDOMIDE 20 MG PO CAPS: ORAL_CAPSULE | ORAL | Status: DC

## 2015-06-01 NOTE — Progress Notes (Signed)
Hematology and Oncology Follow Up Visit  Keyuna Cuthrell Brouse [id number] 10-Jun-1928 82 y.o. 06/01/2015   Principle Diagnosis:   IgG Lambda myeloma -- 11q- and t(11:14)  [facility]  Current Therapy:    Patient to start Velcade/Revlimid     Interim History:  Ms. Torry is back for her follow-up. We first saw her back in October. At that point in time, her labs show that she had a monoclonal protein. She had an IgG lambda protein. She had a M spike of 1.5 g/dL. Her IgG level was 2300 mg/dL. Her lambda light chain was 1.43 mg/dL.  We did do a bone survey on her. This was negative for any obvious lytic bone lesions.  She then had a bone marrow biopsy done. This was done on October 21. The pathology report ([id number]) showed 20% plasma cells in the bone marrow. It was lambda light chain restricted.  Surprisingly enough however, was the fact that she had 2 chromosomal abnormalities. These were noted on FISH. She had an 11q- and t(11:14) abnormalities. I think that this puts her in a more aggressive category.  We have wanted to start her on treatment back last year. However, things were happening with her family that she just could not do treatment with.  Now that everything is settled down, she has agreed to treatment.  Despite being 87 result, she really looks good. She looks at least 10-15 years younger. She is losing a little weight. She's worried about the weight loss. I'm not sure as to why she would be losing weight. I told her that it is possible of the myeloma could be causing this.  Her appetite is good. She has had no change in bowel or bladder habits. She's had no cough. She's had no fever. She's had no rashes.  Overall, her performance status is ECOG 1-2.  Medications:  Current outpatient prescriptions:  .  amLODipine (NORVASC) 5 MG tablet, TAKE 1 TABLET DAILY, Disp: 90 tablet, Rfl: 3 .  aspirin 81 MG tablet, Take 81 mg by mouth every evening. , Disp: , Rfl:  .  cephALEXin  (KEFLEX) 500 MG capsule, Take 1 capsule (500 mg total) by mouth 2 (two) times daily., Disp: 20 capsule, Rfl: 2 .  Cholecalciferol (VITAMIN D) 1000 UNITS capsule, Take 1 capsule (1,000 Units total) by mouth daily., Disp: 100 capsule, Rfl: 3 .  doxycycline (VIBRA-TABS) 100 MG tablet, Take 1 tablet (100 mg total) by mouth 2 (two) times daily., Disp: 20 tablet, Rfl: 0 .  famciclovir (FAMVIR) 500 MG tablet, Please take 1 pill 3 times a day for 7 days and then take 1 pill a day indefinitely., Disp: 30 tablet, Rfl: 12 .  fish oil-omega-3 fatty acids 1000 MG capsule, Take 1 g by mouth daily.  , Disp: , Rfl:  .  HYDROcodone-acetaminophen (NORCO/VICODIN) 5-325 MG tablet, Take 1 tablet by mouth every 6 (six) hours as needed for moderate pain., Disp: 20 tablet, Rfl: 0 .  ibuprofen (ADVIL) 200 MG tablet, Take 1-2 tablets (200-400 mg total) by mouth every 8 (eight) hours as needed for moderate pain., Disp: 30 tablet, Rfl: 0 .  losartan (COZAAR) 100 MG tablet, Take 1 tablet (100 mg total) by mouth every evening., Disp: 90 tablet, Rfl: 3 .  methocarbamol (ROBAXIN) 500 MG tablet, Take 1 tablet (500 mg total) by mouth every 8 (eight) hours as needed for muscle spasms., Disp: 30 tablet, Rfl: 1 .  metoprolol succinate (TOPROL-XL) 50 MG 24 hr tablet, TAKE 1 TABLET  TWICE A DAY, Disp: 180 tablet, Rfl: 3 .  Multiple Vitamin (MULTIVITAMIN) tablet, Take 1 tablet by mouth daily.  , Disp: , Rfl:  .  pravastatin (PRAVACHOL) 40 MG tablet, TAKE 1 TABLET DAILY, Disp: 90 tablet, Rfl: 2 .  vitamin C (ASCORBIC ACID) 500 MG tablet, Take 500 mg by mouth daily.  , Disp: , Rfl:  .  Lenalidomide 20 MG CAPS, Take daily for 21 days. Off for 7 days. Repeat every 28 days. [id number], Disp: 21 capsule, Rfl: 0 .  ondansetron (ZOFRAN) 4 MG tablet, Take 1 tablet (4 mg total) by mouth every 8 (eight) hours as needed for nausea or vomiting., Disp: 30 tablet, Rfl: 3  Allergies:  Allergies  Allergen Reactions  . Bee Venom Swelling  . Ceftin  [Cefuroxime Axetil]     Felt wierd  . Spider Antivenin [Antivenin Latrodectus Mactans] Other (See Comments)    pain    Past Medical History, Surgical history, Social history, and Family History were reviewed and updated.  Review of Systems: As above  Physical Exam:  height is _0  (1.702 m) and weight is 215 lb (97.523 kg). Her oral temperature is 97.9 F (36.6 C). Her blood pressure is 155/73 and her pulse is 84. Her respiration is 18.   Wt Readings from Last 3 Encounters:  06/01/15 215 lb (97.523 kg)  05/07/15 221 lb (100.245 kg)  04/04/15 224 lb (101.606 kg)     Elderly but fairly well-nourished African-American female in no obvious distress. Head and neck exam shows no ocular or oral lesions. She has no palpable cervical or supraclavicular lymph nodes. Lungs are clear bilaterally. Cardiac exam regular rate and rhythm with no murmurs, rubs or bruits. Abdomen is soft. She has good bowel sounds. There is no fluid wave. There is no palpable liver or spleen tip. Back exam shows no tenderness over the spine, ribs or hips. She has no kyphosis. Skin exam shows the vesicular lesions in the left T2 dermatome. Neurological exam shows no focal neurological deficits.  Lab Results  Component Value Date   WBC 5.2 06/01/2015   HGB 10.4* 06/01/2015   HCT 31.8* 06/01/2015   MCV 93 06/01/2015   PLT 214 06/01/2015     Chemistry      Component Value Date/Time   NA 141 06/01/2015 0949   NA 139 05/07/2015 1628   NA 141 03/01/2015 1448   K 3.6 06/01/2015 0949   K 3.7 05/07/2015 1628   K 3.1* 03/01/2015 1448   CL 104 05/07/2015 1628   CL 105 03/01/2015 1448   CO2 23 06/01/2015 0949   CO2 30 05/07/2015 1628   CO2 29 03/01/2015 1448   BUN 14.7 06/01/2015 0949   BUN 11 05/07/2015 1628   BUN 10 03/01/2015 1448   CREATININE 0.8 06/01/2015 0949   CREATININE 0.70 05/07/2015 1628   CREATININE 0.6 03/01/2015 1448      Component Value Date/Time   CALCIUM 8.8 06/01/2015 0949   CALCIUM 9.5  05/07/2015 1628   CALCIUM 8.9 03/01/2015 1448   CALCIUM 9.5 10/02/2011 1601   ALKPHOS 74 06/01/2015 0949   ALKPHOS 72 05/07/2015 1628   ALKPHOS 67 03/01/2015 1448   AST 19 06/01/2015 0949   AST 15 05/07/2015 1628   AST 30 03/01/2015 1448   ALT 13 06/01/2015 0949   ALT 12 05/07/2015 1628   ALT 26 03/01/2015 1448   BILITOT 0.83 06/01/2015 0949   BILITOT 0.6 05/07/2015 1628   BILITOT 1.40 03/01/2015 1448  Impression and Plan: Ms. Carvin is a 82 year old African-American female. She certainly looks younger. She has IgG lambda myeloma. She has 2 chromosomal abnormalities by FISH.  I thinkshe has agreed to go ahead and get started on treatment. I think she would be a good candidate for Velcade/Revlimid.  Given her age, I think we can probably go ahead and omit Decadron. She is not a transplant candidate. Steroids, at her age, might be able bit tough. She has no bony lesions so we probably can forego Zometa.  I spent about 45 minutes with she and her sister.  When I saw her last, I had given her Famvir. She is not taking this. I told that she really has to start taking this to help prevent another shingles outbreak.  I also told her to take 1 full dose aspirin a day.  As far as the dosing, I think that she could handle full dose Velcade. I think I'll start with 20 mg a day of Revlimid. I then this would be reasonable  I will plan to get her started the in 2 weeks. She'll have the Velcade 3 weeks on and 1 week off.   I will plan to see her back for cycle #2.  [name], MD 1/6/20174:49 PM

## 2015-06-05 ENCOUNTER — Other Ambulatory Visit: Payer: Medicare Other

## 2015-06-05 ENCOUNTER — Inpatient Hospital Stay: Payer: Medicare Other

## 2015-06-05 ENCOUNTER — Telehealth: Payer: Self-pay | Admitting: Emergency Medicine

## 2015-06-05 NOTE — Telephone Encounter (Signed)
Patient states that she received the Revlimid in the mail; she is inquiring when she should start this medication. Instructed patient that she should start the Revlimid on 1/16, which is the same day she is scheduled to start the Velcade injections. Patient verbalized understanding.

## 2015-06-07 LAB — SPEP & IFE WITH QIG
ABNORMAL PROTEIN BAND1: 1.6 g/dL
ALBUMIN ELP: 3.7 g/dL — AB (ref 3.8–4.8)
ALPHA-1-GLOBULIN: 0.3 g/dL (ref 0.2–0.3)
ALPHA-2-GLOBULIN: 0.9 g/dL (ref 0.5–0.9)
BETA 2: 0.3 g/dL (ref 0.2–0.5)
BETA GLOBULIN: 0.4 g/dL (ref 0.4–0.6)
GAMMA GLOBULIN: 2 g/dL — AB (ref 0.8–1.7)
IGG (IMMUNOGLOBIN G), SERUM: 2340 mg/dL — AB (ref 690–1700)
IgA: 84 mg/dL (ref 69–380)
IgM, Serum: 43 mg/dL — ABNORMAL LOW (ref 52–322)
TOTAL PROTEIN, SERUM ELECTROPHOR: 7.7 g/dL (ref 6.1–8.1)

## 2015-06-07 LAB — KAPPA/LAMBDA LIGHT CHAINS
KAPPA LAMBDA RATIO: 1.49 (ref 0.26–1.65)
Kappa free light chain: 2.72 mg/dL — ABNORMAL HIGH (ref 0.33–1.94)
Lambda Free Lght Chn: 1.83 mg/dL (ref 0.57–2.63)

## 2015-06-07 LAB — BETA 2 MICROGLOBULIN, SERUM: Beta-2 Microglobulin: 2.53 mg/L — ABNORMAL HIGH (ref <=2.51)

## 2015-06-11 ENCOUNTER — Ambulatory Visit (HOSPITAL_BASED_OUTPATIENT_CLINIC_OR_DEPARTMENT_OTHER): Payer: Medicare Other

## 2015-06-11 VITALS — BP 145/95 | HR 82 | Temp 98.4°F | Resp 20

## 2015-06-11 DIAGNOSIS — C9000 Multiple myeloma not having achieved remission: Secondary | ICD-10-CM

## 2015-06-11 DIAGNOSIS — Z5112 Encounter for antineoplastic immunotherapy: Secondary | ICD-10-CM | POA: Diagnosis present

## 2015-06-11 MED ORDER — PROCHLORPERAZINE MALEATE 10 MG PO TABS: 10.0000 mg | ORAL_TABLET | Freq: Four times a day (QID) | ORAL | Status: DC | PRN

## 2015-06-11 MED ORDER — DEXAMETHASONE 4 MG PO TABS: 8.0000 mg | ORAL_TABLET | Freq: Two times a day (BID) | ORAL | Status: DC

## 2015-06-11 MED ORDER — BORTEZOMIB CHEMO SQ INJECTION 3.5 MG (2.5MG/ML)
1.3000 mg/m2 | Freq: Once | INTRAMUSCULAR | Status: AC
  Administered 2015-06-11: 2.75 mg via SUBCUTANEOUS
  Filled 2015-06-11: qty 2.75

## 2015-06-11 MED ORDER — ONDANSETRON HCL 8 MG PO TABS
8.0000 mg | ORAL_TABLET | Freq: Once | ORAL | Status: AC
  Administered 2015-06-11: 8 mg via ORAL

## 2015-06-11 MED ORDER — ONDANSETRON HCL 8 MG PO TABS
ORAL_TABLET | ORAL | Status: AC
  Filled 2015-06-11: qty 1

## 2015-06-11 MED ORDER — ONDANSETRON HCL 8 MG PO TABS: 8.0000 mg | ORAL_TABLET | Freq: Two times a day (BID) | ORAL | Status: DC

## 2015-06-11 MED FILL — PROCHLORPERAZINE 10 MG TAB: 10 | 7 days supply | Qty: 30 | Fill #0

## 2015-06-11 NOTE — Patient Instructions (Signed)
[facility] Discharge Instructions for Patients Receiving Chemotherapy  Today you received the following chemotherapy agents Velcade  To help prevent nausea and vomiting after your treatment, we encourage you to take your nausea medications as directed on the bottle, your meds have been phoned in at the [facility].  Remember to take 2 of your Zofran  '4mg'$  tabs to make the Zofran 8 mg which is ordered.  You take Zofran 8 mg two times a day for 2 days starting tomorrow then if you need it.  Also we called in Compazine that you can take also as needed for nausea.     If you develop nausea and vomiting that is not controlled by your nausea medication, call the clinic.   BELOW ARE SYMPTOMS THAT SHOULD BE REPORTED IMMEDIATELY:  *FEVER GREATER THAN 100.5 F  *CHILLS WITH OR WITHOUT FEVER  NAUSEA AND VOMITING THAT IS NOT CONTROLLED WITH YOUR NAUSEA MEDICATION  *UNUSUAL SHORTNESS OF BREATH  *UNUSUAL BRUISING OR BLEEDING  TENDERNESS IN MOUTH AND THROAT WITH OR WITHOUT PRESENCE OF ULCERS  *URINARY PROBLEMS  *BOWEL PROBLEMS  UNUSUAL RASH Items with * indicate a potential emergency and should be followed up as soon as possible.  Feel free to call the clinic you have any questions or concerns. The clinic phone number is [phone]. Please show the [facility] at check-in to the Emergency Department and triage nurse.  Bortezomib injection What is this medicine? BORTEZOMIB (bor TEZ oh mib) is a medicine that targets proteins in cancer cells and stops the cancer cells from growing. It is used to treat multiple myeloma and mantle-cell lymphoma. This medicine may be used for other purposes; ask your health care provider or pharmacist if you have questions. What should I tell my health care provider before I take this medicine? They need to know if you have any of these conditions: -diabetes -heart disease -irregular heartbeat -liver disease -on hemodialysis -low  blood counts, like low white blood cells, platelets, or hemoglobin -peripheral neuropathy -taking medicine for blood pressure -an unusual or allergic reaction to bortezomib, mannitol, boron, other medicines, foods, dyes, or preservatives -pregnant or trying to get pregnant -breast-feeding How should I use this medicine? This medicine is for injection into a vein or for injection under the skin. It is given by a health care professional in a hospital or clinic setting. Talk to your pediatrician regarding the use of this medicine in children. Special care may be needed. Overdosage: If you think you have taken too much of this medicine contact a poison control center or emergency room at once. NOTE: This medicine is only for you. Do not share this medicine with others. What if I miss a dose? It is important not to miss your dose. Call your doctor or health care professional if you are unable to keep an appointment. What may interact with this medicine? This medicine may interact with the following medications: -ketoconazole -rifampin -ritonavir -St. John's Wort This list may not describe all possible interactions. Give your health care provider a list of all the medicines, herbs, non-prescription drugs, or dietary supplements you use. Also tell them if you smoke, drink alcohol, or use illegal drugs. Some items may interact with your medicine. What should I watch for while using this medicine? Visit your doctor for checks on your progress. This drug may make you feel generally unwell. This is not uncommon, as chemotherapy can affect healthy cells as well as cancer cells. Report any  side effects. Continue your course of treatment even though you feel ill unless your doctor tells you to stop. You may get drowsy or dizzy. Do not drive, use machinery, or do anything that needs mental alertness until you know how this medicine affects you. Do not stand or sit up quickly, especially if you are an older  patient. This reduces the risk of dizzy or fainting spells. In some cases, you may be given additional medicines to help with side effects. Follow all directions for their use. Call your doctor or health care professional for advice if you get a fever, chills or sore throat, or other symptoms of a cold or flu. Do not treat yourself. This drug decreases your body's ability to fight infections. Try to avoid being around people who are sick. This medicine may increase your risk to bruise or bleed. Call your doctor or health care professional if you notice any unusual bleeding. You may need blood work done while you are taking this medicine. In some patients, this medicine may cause a serious brain infection that may cause death. If you have any problems seeing, thinking, speaking, walking, or standing, tell your doctor right away. If you cannot reach your doctor, urgently seek other source of medical care. Do not become pregnant while taking this medicine. Women should inform their doctor if they wish to become pregnant or think they might be pregnant. There is a potential for serious side effects to an unborn child. Talk to your health care professional or pharmacist for more information. Do not breast-feed an infant while taking this medicine. Check with your doctor or health care professional if you get an attack of severe diarrhea, nausea and vomiting, or if you sweat a lot. The loss of too much body fluid can make it dangerous for you to take this medicine. What side effects may I notice from receiving this medicine? Side effects that you should report to your doctor or health care professional as soon as possible: -allergic reactions like skin rash, itching or hives, swelling of the face, lips, or tongue -breathing problems -changes in hearing -changes in vision -fast, irregular heartbeat -feeling faint or lightheaded, falls -pain, tingling, numbness in the hands or feet -right upper belly  pain -seizures -swelling of the ankles, feet, hands -unusual bleeding or bruising -unusually weak or tired -vomiting -yellowing of the eyes or skin Side effects that usually do not require medical attention (report to your doctor or health care professional if they continue or are bothersome): -changes in emotions or moods -constipation -diarrhea -loss of appetite -headache -irritation at site where injected -nausea This list may not describe all possible side effects. Call your doctor for medical advice about side effects. You may report side effects to FDA at 1-800-FDA-1088. Where should I keep my medicine? This drug is given in a hospital or clinic and will not be stored at home. NOTE: This sheet is a summary. It may not cover all possible information. If you have questions about this medicine, talk to your doctor, pharmacist, or health care provider.    2016, [employer]. (2014-07-11 14:47:04)

## 2015-06-12 ENCOUNTER — Telehealth: Payer: Self-pay

## 2015-06-12 NOTE — Telephone Encounter (Signed)
Patient call for chemo follow up call today. Patient state she is doing very well. No nausea or vomiting. Patient state she is taking her medication as directed too.

## 2015-06-14 ENCOUNTER — Encounter: Payer: Self-pay | Admitting: Hematology & Oncology

## 2015-06-18 ENCOUNTER — Ambulatory Visit (HOSPITAL_BASED_OUTPATIENT_CLINIC_OR_DEPARTMENT_OTHER): Payer: Medicare Other

## 2015-06-18 ENCOUNTER — Inpatient Hospital Stay: Payer: Medicare Other

## 2015-06-18 ENCOUNTER — Other Ambulatory Visit: Payer: Medicare Other

## 2015-06-18 VITALS — BP 161/74 | HR 85 | Temp 98.2°F | Resp 18

## 2015-06-18 DIAGNOSIS — Z5112 Encounter for antineoplastic immunotherapy: Secondary | ICD-10-CM

## 2015-06-18 DIAGNOSIS — C9000 Multiple myeloma not having achieved remission: Secondary | ICD-10-CM

## 2015-06-18 LAB — CBC WITH DIFFERENTIAL (CANCER CENTER ONLY)
BASO#: 0 10*3/uL (ref 0.0–0.2)
BASO%: 0.2 % (ref 0.0–2.0)
EOS ABS: 0.1 10*3/uL (ref 0.0–0.5)
EOS%: 3 % (ref 0.0–7.0)
HCT: 30.6 % — ABNORMAL LOW (ref 34.8–46.6)
HGB: 10 g/dL — ABNORMAL LOW (ref 11.6–15.9)
LYMPH#: 1.3 10*3/uL (ref 0.9–3.3)
LYMPH%: 30 % (ref 14.0–48.0)
MCH: 30.6 pg (ref 26.0–34.0)
MCHC: 32.7 g/dL (ref 32.0–36.0)
MCV: 94 fL (ref 81–101)
MONO#: 0.3 10*3/uL (ref 0.1–0.9)
MONO%: 6.3 % (ref 0.0–13.0)
NEUT#: 2.6 10*3/uL (ref 1.5–6.5)
NEUT%: 60.5 % (ref 39.6–80.0)
PLATELETS: 198 10*3/uL (ref 145–400)
RBC: 3.27 10*6/uL — AB (ref 3.70–5.32)
RDW: 15 % (ref 11.1–15.7)
WBC: 4.3 10*3/uL (ref 3.9–10.0)

## 2015-06-18 LAB — CMP (CANCER CENTER ONLY)
ALBUMIN: 3.2 g/dL — AB (ref 3.3–5.5)
ALT(SGPT): 15 U/L (ref 10–47)
AST: 20 U/L (ref 11–38)
Alkaline Phosphatase: 47 U/L (ref 26–84)
BUN, Bld: 9 mg/dL (ref 7–22)
CALCIUM: 8.1 mg/dL (ref 8.0–10.3)
CO2: 28 meq/L (ref 18–33)
CREATININE: 0.6 mg/dL (ref 0.6–1.2)
Chloride: 103 mEq/L (ref 98–108)
Glucose, Bld: 93 mg/dL (ref 73–118)
Potassium: 3.5 mEq/L (ref 3.3–4.7)
SODIUM: 137 meq/L (ref 128–145)
TOTAL PROTEIN: 7.4 g/dL (ref 6.4–8.1)
Total Bilirubin: 1 mg/dl (ref 0.20–1.60)

## 2015-06-18 MED ORDER — ONDANSETRON HCL 8 MG PO TABS
ORAL_TABLET | ORAL | Status: AC
  Filled 2015-06-18: qty 1

## 2015-06-18 MED ORDER — BORTEZOMIB CHEMO SQ INJECTION 3.5 MG (2.5MG/ML)
1.3000 mg/m2 | Freq: Once | INTRAMUSCULAR | Status: AC
  Administered 2015-06-18: 2.75 mg via SUBCUTANEOUS
  Filled 2015-06-18: qty 2.75

## 2015-06-18 MED ORDER — ONDANSETRON HCL 8 MG PO TABS
8.0000 mg | ORAL_TABLET | Freq: Once | ORAL | Status: AC
  Administered 2015-06-18: 8 mg via ORAL

## 2015-06-18 MED FILL — ONDANSETRON HCL 8 MG TABLET: 8 | 15 days supply | Qty: 30 | Fill #0

## 2015-06-18 NOTE — Patient Instructions (Signed)
Bortezomib injection What is this medicine? BORTEZOMIB (bor TEZ oh mib) is a medicine that targets proteins in cancer cells and stops the cancer cells from growing. It is used to treat multiple myeloma and mantle-cell lymphoma. This medicine may be used for other purposes; ask your health care provider or pharmacist if you have questions. What should I tell my health care provider before I take this medicine? They need to know if you have any of these conditions: -diabetes -heart disease -irregular heartbeat -liver disease -on hemodialysis -low blood counts, like low white blood cells, platelets, or hemoglobin -peripheral neuropathy -taking medicine for blood pressure -an unusual or allergic reaction to bortezomib, mannitol, boron, other medicines, foods, dyes, or preservatives -pregnant or trying to get pregnant -breast-feeding How should I use this medicine? This medicine is for injection into a vein or for injection under the skin. It is given by a health care professional in a hospital or clinic setting. Talk to your pediatrician regarding the use of this medicine in children. Special care may be needed. Overdosage: If you think you have taken too much of this medicine contact a poison control center or emergency room at once. NOTE: This medicine is only for you. Do not share this medicine with others. What if I miss a dose? It is important not to miss your dose. Call your doctor or health care professional if you are unable to keep an appointment. What may interact with this medicine? This medicine may interact with the following medications: -ketoconazole -rifampin -ritonavir -St. John's Wort This list may not describe all possible interactions. Give your health care provider a list of all the medicines, herbs, non-prescription drugs, or dietary supplements you use. Also tell them if you smoke, drink alcohol, or use illegal drugs. Some items may interact with your medicine. What  should I watch for while using this medicine? Visit your doctor for checks on your progress. This drug may make you feel generally unwell. This is not uncommon, as chemotherapy can affect healthy cells as well as cancer cells. Report any side effects. Continue your course of treatment even though you feel ill unless your doctor tells you to stop. You may get drowsy or dizzy. Do not drive, use machinery, or do anything that needs mental alertness until you know how this medicine affects you. Do not stand or sit up quickly, especially if you are an older patient. This reduces the risk of dizzy or fainting spells. In some cases, you may be given additional medicines to help with side effects. Follow all directions for their use. Call your doctor or health care professional for advice if you get a fever, chills or sore throat, or other symptoms of a cold or flu. Do not treat yourself. This drug decreases your body's ability to fight infections. Try to avoid being around people who are sick. This medicine may increase your risk to bruise or bleed. Call your doctor or health care professional if you notice any unusual bleeding. You may need blood work done while you are taking this medicine. In some patients, this medicine may cause a serious brain infection that may cause death. If you have any problems seeing, thinking, speaking, walking, or standing, tell your doctor right away. If you cannot reach your doctor, urgently seek other source of medical care. Do not become pregnant while taking this medicine. Women should inform their doctor if they wish to become pregnant or think they might be pregnant. There is a potential for serious  side effects to an unborn child. Talk to your health care professional or pharmacist for more information. Do not breast-feed an infant while taking this medicine. Check with your doctor or health care professional if you get an attack of severe diarrhea, nausea and vomiting, or if  you sweat a lot. The loss of too much body fluid can make it dangerous for you to take this medicine. What side effects may I notice from receiving this medicine? Side effects that you should report to your doctor or health care professional as soon as possible: -allergic reactions like skin rash, itching or hives, swelling of the face, lips, or tongue -breathing problems -changes in hearing -changes in vision -fast, irregular heartbeat -feeling faint or lightheaded, falls -pain, tingling, numbness in the hands or feet -right upper belly pain -seizures -swelling of the ankles, feet, hands -unusual bleeding or bruising -unusually weak or tired -vomiting -yellowing of the eyes or skin Side effects that usually do not require medical attention (report to your doctor or health care professional if they continue or are bothersome): -changes in emotions or moods -constipation -diarrhea -loss of appetite -headache -irritation at site where injected -nausea This list may not describe all possible side effects. Call your doctor for medical advice about side effects. You may report side effects to FDA at 1-800-FDA-1088. Where should I keep my medicine? This drug is given in a hospital or clinic and will not be stored at home. NOTE: This sheet is a summary. It may not cover all possible information. If you have questions about this medicine, talk to your doctor, pharmacist, or health care provider.    2016, Elsevier/Gold Standard. (2014-07-11 14:47:04)

## 2015-06-25 ENCOUNTER — Inpatient Hospital Stay: Payer: Medicare Other

## 2015-06-25 ENCOUNTER — Other Ambulatory Visit: Payer: Medicare Other

## 2015-06-25 ENCOUNTER — Ambulatory Visit (HOSPITAL_BASED_OUTPATIENT_CLINIC_OR_DEPARTMENT_OTHER): Payer: Medicare Other

## 2015-06-25 VITALS — BP 129/81 | HR 70 | Temp 97.8°F | Wt 223.0 lb

## 2015-06-25 DIAGNOSIS — Z5112 Encounter for antineoplastic immunotherapy: Secondary | ICD-10-CM | POA: Diagnosis not present

## 2015-06-25 DIAGNOSIS — C9000 Multiple myeloma not having achieved remission: Secondary | ICD-10-CM | POA: Diagnosis not present

## 2015-06-25 MED ORDER — ONDANSETRON HCL 8 MG PO TABS
ORAL_TABLET | ORAL | Status: AC
  Filled 2015-06-25: qty 1

## 2015-06-25 MED ORDER — ONDANSETRON HCL 8 MG PO TABS
8.0000 mg | ORAL_TABLET | Freq: Once | ORAL | Status: AC
  Administered 2015-06-25: 8 mg via ORAL

## 2015-06-25 MED ORDER — BORTEZOMIB CHEMO SQ INJECTION 3.5 MG (2.5MG/ML)
1.3000 mg/m2 | Freq: Once | INTRAMUSCULAR | Status: AC
  Administered 2015-06-25: 2.75 mg via SUBCUTANEOUS
  Filled 2015-06-25: qty 2.75

## 2015-06-25 MED FILL — FAMCICLOVIR 500 MG TABLET: 500 | 30 days supply | Qty: 30 | Fill #0 | Status: TO

## 2015-06-25 NOTE — Patient Instructions (Signed)
[facility] Discharge Instructions for Patients Receiving Chemotherapy  Today you received the following chemotherapy agents Velcade  To help prevent nausea and vomiting after your treatment, we encourage you to take your nausea medication    If you develop nausea and vomiting that is not controlled by your nausea medication, call the clinic.   BELOW ARE SYMPTOMS THAT SHOULD BE REPORTED IMMEDIATELY:  *FEVER GREATER THAN 100.5 F  *CHILLS WITH OR WITHOUT FEVER  NAUSEA AND VOMITING THAT IS NOT CONTROLLED WITH YOUR NAUSEA MEDICATION  *UNUSUAL SHORTNESS OF BREATH  *UNUSUAL BRUISING OR BLEEDING  TENDERNESS IN MOUTH AND THROAT WITH OR WITHOUT PRESENCE OF ULCERS  *URINARY PROBLEMS  *BOWEL PROBLEMS  UNUSUAL RASH Items with * indicate a potential emergency and should be followed up as soon as possible.  Feel free to call the clinic you have any questions or concerns. The clinic phone number is [phone].  Please show the CHEMO ALERT CARD at check-in to the Emergency Department and triage nurse.   

## 2015-07-02 ENCOUNTER — Other Ambulatory Visit: Payer: Medicare Other

## 2015-07-02 ENCOUNTER — Inpatient Hospital Stay: Payer: Medicare Other

## 2015-07-02 ENCOUNTER — Other Ambulatory Visit: Payer: Self-pay | Admitting: *Deleted

## 2015-07-02 ENCOUNTER — Ambulatory Visit: Payer: Medicare Other | Admitting: Hematology & Oncology

## 2015-07-02 DIAGNOSIS — C9000 Multiple myeloma not having achieved remission: Secondary | ICD-10-CM

## 2015-07-02 MED ORDER — LENALIDOMIDE 20 MG PO CAPS: ORAL_CAPSULE | ORAL | Status: DC

## 2015-07-06 ENCOUNTER — Encounter (HOSPITAL_BASED_OUTPATIENT_CLINIC_OR_DEPARTMENT_OTHER): Payer: Self-pay | Admitting: *Deleted

## 2015-07-06 ENCOUNTER — Emergency Department (HOSPITAL_BASED_OUTPATIENT_CLINIC_OR_DEPARTMENT_OTHER)
Admission: EM | Admit: 2015-07-06 | Discharge: 2015-07-06 | Disposition: A | Discharge status: Home / Self Care | Payer: Medicare Other | Attending: Physician Assistant | Admitting: Physician Assistant

## 2015-07-06 ENCOUNTER — Emergency Department (HOSPITAL_BASED_OUTPATIENT_CLINIC_OR_DEPARTMENT_OTHER): Payer: Medicare Other

## 2015-07-06 DIAGNOSIS — Z8619 Personal history of other infectious and parasitic diseases: Secondary | ICD-10-CM | POA: Diagnosis not present

## 2015-07-06 DIAGNOSIS — Z87891 Personal history of nicotine dependence: Secondary | ICD-10-CM | POA: Insufficient documentation

## 2015-07-06 DIAGNOSIS — Z8669 Personal history of other diseases of the nervous system and sense organs: Secondary | ICD-10-CM | POA: Insufficient documentation

## 2015-07-06 DIAGNOSIS — M79641 Pain in right hand: Secondary | ICD-10-CM | POA: Diagnosis present

## 2015-07-06 DIAGNOSIS — E785 Hyperlipidemia, unspecified: Secondary | ICD-10-CM | POA: Insufficient documentation

## 2015-07-06 DIAGNOSIS — Z8579 Personal history of other malignant neoplasms of lymphoid, hematopoietic and related tissues: Secondary | ICD-10-CM | POA: Diagnosis not present

## 2015-07-06 DIAGNOSIS — I10 Essential (primary) hypertension: Secondary | ICD-10-CM | POA: Insufficient documentation

## 2015-07-06 DIAGNOSIS — Z79899 Other long term (current) drug therapy: Secondary | ICD-10-CM | POA: Diagnosis not present

## 2015-07-06 DIAGNOSIS — Z8709 Personal history of other diseases of the respiratory system: Secondary | ICD-10-CM | POA: Insufficient documentation

## 2015-07-06 DIAGNOSIS — M1990 Unspecified osteoarthritis, unspecified site: Secondary | ICD-10-CM | POA: Insufficient documentation

## 2015-07-06 DIAGNOSIS — E669 Obesity, unspecified: Secondary | ICD-10-CM | POA: Diagnosis not present

## 2015-07-06 DIAGNOSIS — Z8673 Personal history of transient ischemic attack (TIA), and cerebral infarction without residual deficits: Secondary | ICD-10-CM | POA: Diagnosis not present

## 2015-07-06 DIAGNOSIS — Z8659 Personal history of other mental and behavioral disorders: Secondary | ICD-10-CM | POA: Insufficient documentation

## 2015-07-06 DIAGNOSIS — L03113 Cellulitis of right upper limb: Secondary | ICD-10-CM | POA: Insufficient documentation

## 2015-07-06 DIAGNOSIS — M7989 Other specified soft tissue disorders: Secondary | ICD-10-CM | POA: Diagnosis not present

## 2015-07-06 DIAGNOSIS — L0390 Cellulitis, unspecified: Secondary | ICD-10-CM

## 2015-07-06 MED ORDER — OXYCODONE-ACETAMINOPHEN 5-325 MG PO TABS: 1.0000 | ORAL_TABLET | Freq: Four times a day (QID) | ORAL | Status: DC | PRN

## 2015-07-06 MED ORDER — CLINDAMYCIN HCL 150 MG PO CAPS: 150.0000 mg | ORAL_CAPSULE | Freq: Four times a day (QID) | ORAL | Status: DC

## 2015-07-06 MED ORDER — DEXAMETHASONE SODIUM PHOSPHATE 10 MG/ML IJ SOLN
10.0000 mg | Freq: Once | INTRAMUSCULAR | Status: AC
  Administered 2015-07-06: 10 mg via INTRAMUSCULAR
  Filled 2015-07-06: qty 1

## 2015-07-06 MED ORDER — OXYCODONE-ACETAMINOPHEN 5-325 MG PO TABS
1.0000 | ORAL_TABLET | Freq: Once | ORAL | Status: AC
  Administered 2015-07-06: 1 via ORAL
  Filled 2015-07-06: qty 1

## 2015-07-06 MED ORDER — CLINDAMYCIN HCL 150 MG PO CAPS
300.0000 mg | ORAL_CAPSULE | Freq: Once | ORAL | Status: AC
  Administered 2015-07-06: 300 mg via ORAL
  Filled 2015-07-06: qty 2

## 2015-07-06 MED FILL — CLINDAMYCIN HCL 150 MG CAP: 150 | 10 days supply | Qty: 40 | Fill #0

## 2015-07-06 MED FILL — OXYCODONE/APAP 5-325: 5-325 | 3 days supply | Qty: 11 | Fill #0

## 2015-07-06 NOTE — ED Notes (Signed)
Swelling and warmth to right hand since Monday. Denies known injury

## 2015-07-06 NOTE — ED Notes (Signed)
Pt placed on auto vitals Q30.  

## 2015-07-06 NOTE — ED Provider Notes (Signed)
CSN: [id number]     Arrival date & time 07/06/15  1032 History   First MD Initiated Contact with Patient 07/06/15 1400     Chief Complaint  Patient presents with  . Hand Pain     (Consider location/radiation/quality/duration/timing/severity/associated sxs/prior Treatment) HPI   Patient is a 82 year old female presenting with unilateral right hand warmth and swelling. Patient denies any fever. Patient has history of this exact same swelling happening in October. She was seen in our ED given both Decadron and treatment for cellulitis.  It appears that previously to this last visit she's been seen prior for the same thing. At that time she was given steroids and cellulitis treatment as well.  Patient thinks that she might have nicked the skin while cleaning. Patient is starting treatment with Dr.Ennever for MGUS on MOnday and thus will be seen on Monday as folow up.  She has also had labs done within the last two weeks showing normal renal function.  Past Medical History  Diagnosis Date  . ANXIETY 03/05/2007  . ATAXIA 12/26/2009  . BEE STING 03/05/2007  . BRONCHITIS, ACUTE 06/28/2010  . CANDIDIASIS, VAGINAL 09/08/2008  . CEREBROVASCULAR ACCIDENT, HX OF 01/04/2010  . CERUMEN IMPACTION 06/05/2008  . DIZZINESS 12/26/2009  . Epistaxis 06/05/2008  . Headache(784.0) 06/05/2008  . HYPERLIPIDEMIA 12/08/2006  . HYPERTENSION 12/08/2006  . 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 NECK PAIN 06/05/2008  . OBESITY 09/07/2009  . ONYCHOMYCOSIS 03/09/2009  . OSTEOARTHRITIS 12/08/2006  . PARESTHESIA 12/26/2009  . TOBACCO USE, QUIT 03/09/2009  . Multiple myeloma ([place]) 04/03/2015   Past Surgical History  Procedure Laterality Date  . Breast surgery    . Abdominal hysterectomy     Family History  Problem Relation Age of Onset  . Hypertension Other   . Hypertension Mother   . Hypertension Father    Social History  Substance Use Topics  . Smoking status: Former Research scientist (life sciences)  . Smokeless tobacco: None  . Alcohol Use: No     OB History    No data available     Review of Systems  Constitutional: Negative for fever, activity change and fatigue.  HENT: Negative for congestion.   Respiratory: Negative for shortness of breath.   Cardiovascular: Negative for chest pain.  Gastrointestinal: Negative for abdominal pain.  Genitourinary: Negative for dysuria.  Musculoskeletal: Negative for back pain.  Neurological: Negative for dizziness.  Psychiatric/Behavioral: Negative for agitation.      Allergies  Bee venom; Ceftin; and Spider antivenin  Home Medications   Prior to Admission medications   Medication Sig Start Date End Date Taking? Authorizing Provider  amLODipine (NORVASC) 5 MG tablet TAKE 1 TABLET DAILY 01/11/15  Yes Aleksei Plotnikov V, MD  Cholecalciferol (VITAMIN D) 1000 UNITS capsule Take 1 capsule (1,000 Units total) by mouth daily. 01/03/11  Yes Aleksei Plotnikov V, MD  fish oil-omega-3 fatty acids 1000 MG capsule Take 1 g by mouth daily.     Yes Historical Provider, MD  ibuprofen (ADVIL) 200 MG tablet Take 1-2 tablets (200-400 mg total) by mouth every 8 (eight) hours as needed for moderate pain. 02/01/15  Yes Aleksei Plotnikov V, MD  lenalidomide (REVLIMID) 20 MG capsule Take daily for 21 days. Off for 7 days. Repeat every 28 days. CXKG#[id number] 07/02/15  Yes [name], MD  losartan (COZAAR) 100 MG tablet Take 1 tablet (100 mg total) by mouth every evening. 11/22/14  Yes Aleksei Plotnikov V, MD  methocarbamol (ROBAXIN) 500 MG tablet Take 1 tablet (500 mg total) by  mouth every 8 (eight) hours as needed for muscle spasms. 11/28/14  Yes Aleksei Plotnikov V, MD  metoprolol succinate (TOPROL-XL) 50 MG 24 hr tablet TAKE 1 TABLET TWICE A DAY 10/09/14  Yes Aleksei Plotnikov V, MD  Multiple Vitamin (MULTIVITAMIN) tablet Take 1 tablet by mouth daily.     Yes Historical Provider, MD  ondansetron (ZOFRAN) 4 MG tablet Take 1 tablet (4 mg total) by mouth every 8 (eight) hours as needed for nausea or vomiting. 06/01/15   Yes [name], MD  ondansetron (ZOFRAN) 8 MG tablet Take 1 tablet (8 mg total) by mouth 2 (two) times daily. Start the day after chemo for 2 days. Then take as needed for nausea or vomiting. 06/11/15  Yes [name], MD  pravastatin (PRAVACHOL) 40 MG tablet TAKE 1 TABLET DAILY 01/19/15  Yes Aleksei Plotnikov V, MD  prochlorperazine (COMPAZINE) 10 MG tablet Take 1 tablet (10 mg total) by mouth every 6 (six) hours as needed (Nausea or vomiting). 06/11/15  Yes [name], MD  vitamin C (ASCORBIC ACID) 500 MG tablet Take 500 mg by mouth daily.     Yes Historical Provider, MD  famciclovir (FAMVIR) 500 MG tablet Please take 1 pill 3 times a day for 7 days and then take 1 pill a day indefinitely. 04/04/15   [name], MD   BP 140/65 mmHg  Pulse 86  Temp(Src) 98.3 F (36.8 C) (Oral)  Resp 18  Ht '5\' 7"'$  (1.702 m)  Wt 222 lb (100.699 kg)  BMI 34.76 kg/m2  SpO2 96% Physical Exam  Constitutional: She is oriented to person, place, and time. She appears well-developed and well-nourished.  HENT:  Head: Normocephalic and atraumatic.  Eyes: Conjunctivae are normal. Right eye exhibits no discharge.  Neck: Neck supple.  Cardiovascular: Normal rate, regular rhythm and normal heart sounds.   No murmur heard. Pulmonary/Chest: Effort normal and breath sounds normal. She has no wheezes. She has no rales.  Abdominal: Soft. She exhibits no distension. There is no tenderness.  Musculoskeletal: Normal range of motion. She exhibits no edema.  Mild erythema with swelling to R wrist. No pain with movement of wrist.  She has mild dorsal swelling.    Neurological: She is oriented to person, place, and time. No cranial nerve deficit.  Skin: Skin is warm and dry. No rash noted. She is not diaphoretic.  Psychiatric: She has a normal mood and affect.  Nursing note and vitals reviewed.   ED Course  Procedures (including critical care time) Labs Review Labs Reviewed  COMPREHENSIVE METABOLIC PANEL   CBC WITH DIFFERENTIAL/PLATELET    Imaging Review Dg Wrist Complete Right  07/06/2015  CLINICAL DATA:  Right hand and wrist pain and swelling for 4 days. EXAM: RIGHT WRIST - COMPLETE 3+ VIEW COMPARISON:  03/14/2015 FINDINGS: Chronically flattened distal head of the first metacarpal. Spurring at the interphalangeal joint of the thumb. Mild generalized bony demineralization. Articular space narrowing between the scaphoid and trapezium. There is a suggestion of soft tissue swelling along the wrist, especially along the volar wrist, but also involving the subcutaneous tissues. IMPRESSION: 1. Soft tissue swelling in the wrist with subcutaneous edema and possible volar soft tissue swelling anterior to the distal radius. Etiology uncertain. Clinically warranted MRI could be utilized for further workup. 2. Degenerative findings in the lateral carpus and in the thumb. Chronic flattening of the head of the first metacarpal. Electronically Signed   By: Van Clines M.D.   On: 07/06/2015 13:01  Dg Hand Complete Right  07/06/2015  CLINICAL DATA:  Pain and swelling for 4 days EXAM: RIGHT HAND - COMPLETE 3+ VIEW COMPARISON:  March 14, 2015 FINDINGS: Frontal, oblique, and lateral views were obtained. There is a degree of generalized soft tissue swelling. There is no acute fracture or dislocation. There is osteoarthritic change in all PIP and DIP joints as well as in the first MCP and IP joint regions. Osteoarthritic changes also noted in the scaphotrapezial and first carpal -metacarpal joints. Bones are osteoporotic. No erosive change. IMPRESSION: Multifocal osteoarthritic change. Bones appear osteoporotic. There is generalized soft tissue swelling. No acute fracture or dislocation. No erosive change. Electronically Signed   By: Lowella Grip III M.D.   On: 07/06/2015 13:00   I have personally reviewed and evaluated these images and lab results as part of my medical decision-making.   EKG  Interpretation None      MDM   Final diagnoses:  None    Patient is a 82 year old female presenting with right wrist swelling and erythema. Patient had this for 4-5 times in the past. Treated with with antibiotics and Decadron usually. Patient reports this usually resolves. She is very healthy 82 year old female. She does not want any inpatient hospitalization at this time. She does not want any labs at this time. She is here with daughter. I feel that she is making reasonable decisions. Patient had normal creatinine function within the last month. We will go ahead and treat with IM Decadron and outpatient and antibiotics. Patient has follow-up on Monday with Dr. Marin Olp.  Doubt septic wrist given she has no tenderness or pain with passive or active motion. Patient given strict return precautions and she expressed understanding.         Brack Shaddock Julio Alm, MD 07/06/15 1515

## 2015-07-06 NOTE — Discharge Instructions (Signed)
We are unsure if this is an infection, arthritis or gout.  We will give you treatment for all of the above.  You have had this several times n the past and it has resolved with similar treatmetn. Please return immediatley with any fever, increased pain or wrosening swellig or other concerns.  Cellulitis Cellulitis is an infection of the skin and the tissue beneath it. The infected area is usually red and tender. Cellulitis occurs most often in the arms and lower legs.  CAUSES  Cellulitis is caused by bacteria that enter the skin through cracks or cuts in the skin. The most common types of bacteria that cause cellulitis are staphylococci and streptococci. SIGNS AND SYMPTOMS   Redness and warmth.  Swelling.  Tenderness or pain.  Fever. DIAGNOSIS  Your health care provider can usually determine what is wrong based on a physical exam. Blood tests may also be done. TREATMENT  Treatment usually involves taking an antibiotic medicine. HOME CARE INSTRUCTIONS   Take your antibiotic medicine as directed by your health care provider. Finish the antibiotic even if you start to feel better.  Keep the infected arm or leg elevated to reduce swelling.  Apply a warm cloth to the affected area up to 4 times per day to relieve pain.  Take medicines only as directed by your health care provider.  Keep all follow-up visits as directed by your health care provider. SEEK MEDICAL CARE IF:   You notice red streaks coming from the infected area.  Your red area gets larger or turns dark in color.  Your bone or joint underneath the infected area becomes painful after the skin has healed.  Your infection returns in the same area or another area.  You notice a swollen bump in the infected area.  You develop new symptoms.  You have a fever. SEEK IMMEDIATE MEDICAL CARE IF:   You feel very sleepy.  You develop vomiting or diarrhea.  You have a general ill feeling (malaise) with muscle aches and  pains.   This information is not intended to replace advice given to you by your health care provider. Make sure you discuss any questions you have with your health care provider.   Document Released: 02/19/2005 Document Revised: 01/31/2015 Document Reviewed: 07/28/2011 Elsevier Interactive Patient Education [employer].

## 2015-07-09 ENCOUNTER — Other Ambulatory Visit (HOSPITAL_BASED_OUTPATIENT_CLINIC_OR_DEPARTMENT_OTHER): Payer: Medicare Other

## 2015-07-09 ENCOUNTER — Encounter: Payer: Self-pay | Admitting: Hematology & Oncology

## 2015-07-09 ENCOUNTER — Ambulatory Visit (HOSPITAL_BASED_OUTPATIENT_CLINIC_OR_DEPARTMENT_OTHER): Payer: Medicare Other

## 2015-07-09 ENCOUNTER — Ambulatory Visit (HOSPITAL_BASED_OUTPATIENT_CLINIC_OR_DEPARTMENT_OTHER): Payer: Medicare Other | Admitting: Hematology & Oncology

## 2015-07-09 VITALS — BP 134/71 | HR 77 | Temp 98.3°F | Resp 18 | Ht 67.0 in | Wt 217.0 lb

## 2015-07-09 DIAGNOSIS — C9000 Multiple myeloma not having achieved remission: Secondary | ICD-10-CM | POA: Diagnosis not present

## 2015-07-09 DIAGNOSIS — Z5112 Encounter for antineoplastic immunotherapy: Secondary | ICD-10-CM

## 2015-07-09 LAB — COMPREHENSIVE METABOLIC PANEL
ALBUMIN: 3.2 g/dL — AB (ref 3.5–5.0)
ALK PHOS: 68 U/L (ref 40–150)
ALT: 19 U/L (ref 0–55)
ANION GAP: 9 meq/L (ref 3–11)
AST: 19 U/L (ref 5–34)
BILIRUBIN TOTAL: 0.81 mg/dL (ref 0.20–1.20)
BUN: 14.9 mg/dL (ref 7.0–26.0)
CO2: 25 mEq/L (ref 22–29)
Calcium: 8.8 mg/dL (ref 8.4–10.4)
Chloride: 105 mEq/L (ref 98–109)
Creatinine: 0.8 mg/dL (ref 0.6–1.1)
EGFR: 79 mL/min/{1.73_m2} — AB (ref >90)
Glucose: 89 mg/dl (ref 70–140)
Potassium: 3.6 mEq/L (ref 3.5–5.1)
Sodium: 138 mEq/L (ref 136–145)
TOTAL PROTEIN: 8 g/dL (ref 6.4–8.3)

## 2015-07-09 LAB — CBC WITH DIFFERENTIAL (CANCER CENTER ONLY)
BASO#: 0 10*3/uL (ref 0.0–0.2)
BASO%: 0.6 % (ref 0.0–2.0)
EOS%: 0.9 % (ref 0.0–7.0)
Eosinophils Absolute: 0 10*3/uL (ref 0.0–0.5)
HEMATOCRIT: 31.3 % — AB (ref 34.8–46.6)
HGB: 10.2 g/dL — ABNORMAL LOW (ref 11.6–15.9)
LYMPH#: 1.8 10*3/uL (ref 0.9–3.3)
LYMPH%: 38.1 % (ref 14.0–48.0)
MCH: 30 pg (ref 26.0–34.0)
MCHC: 32.6 g/dL (ref 32.0–36.0)
MCV: 92 fL (ref 81–101)
MONO#: 0.7 10*3/uL (ref 0.1–0.9)
MONO%: 15.2 % — ABNORMAL HIGH (ref 0.0–13.0)
NEUT#: 2.1 10*3/uL (ref 1.5–6.5)
NEUT%: 45.2 % (ref 39.6–80.0)
Platelets: 245 10*3/uL (ref 145–400)
RBC: 3.4 10*6/uL — ABNORMAL LOW (ref 3.70–5.32)
RDW: 14.2 % (ref 11.1–15.7)
WBC: 4.7 10*3/uL (ref 3.9–10.0)

## 2015-07-09 MED ORDER — ONDANSETRON HCL 8 MG PO TABS
ORAL_TABLET | ORAL | Status: AC
  Filled 2015-07-09: qty 1

## 2015-07-09 MED ORDER — BORTEZOMIB CHEMO SQ INJECTION 3.5 MG (2.5MG/ML)
1.3000 mg/m2 | Freq: Once | INTRAMUSCULAR | Status: AC
  Administered 2015-07-09: 2.75 mg via SUBCUTANEOUS
  Filled 2015-07-09: qty 2.75

## 2015-07-09 MED ORDER — ONDANSETRON HCL 8 MG PO TABS
8.0000 mg | ORAL_TABLET | Freq: Once | ORAL | Status: AC
  Administered 2015-07-09: 8 mg via ORAL

## 2015-07-09 NOTE — Progress Notes (Signed)
Hematology and Oncology Follow Up Visit  Dayva Macbeth Mcnabb [id number] 09/16/1928 82 y.o. 07/09/2015   Principle Diagnosis:   IgG Lambda myeloma -- 11q- and t(11:14)  JEHOVAH'S WITNESS  Current Therapy:    Patient s/p c#2 of Velcade/Revlimid     Interim History:  Ms. Brauner is back for her follow-up.she did well with her first cycle of treatment. She did go to the emergency room a week ago. She is swelling of the dorsum of the right hand. She was put on clindamycin. I'm sure that this was cellulitis. I told him to stop the clindamycin as I do not want her to get Clostridium diarrhea.  Otherwise, she has done quite well. She had no problems with the Revlimid. She's had no rashes or she's had no leg swelling. She's had no diarrhea. She had no constipation. Per she is still taking the aspirin. I told her to keep taking the aspirin and to make sure she takes it with food.  Her appetite is good. She's had no nausea or vomiting.  Overall, I would not say that her performance status is ECOG 1.   Medications:  Current outpatient prescriptions:  .  amLODipine (NORVASC) 5 MG tablet, TAKE 1 TABLET DAILY, Disp: 90 tablet, Rfl: 3 .  Cholecalciferol (VITAMIN D) 1000 UNITS capsule, Take 1 capsule (1,000 Units total) by mouth daily., Disp: 100 capsule, Rfl: 3 .  clindamycin (CLEOCIN) 150 MG capsule, Take 1 capsule (150 mg total) by mouth 4 (four) times daily., Disp: 40 capsule, Rfl: 0 .  famciclovir (FAMVIR) 500 MG tablet, Please take 1 pill 3 times a day for 7 days and then take 1 pill a day indefinitely., Disp: 30 tablet, Rfl: 12 .  fish oil-omega-3 fatty acids 1000 MG capsule, Take 1 g by mouth daily.  , Disp: , Rfl:  .  ibuprofen (ADVIL) 200 MG tablet, Take 1-2 tablets (200-400 mg total) by mouth every 8 (eight) hours as needed for moderate pain., Disp: 30 tablet, Rfl: 0 .  lenalidomide (REVLIMID) 20 MG capsule, Take daily for 21 days. Off for 7 days. Repeat every 28 days. [id number], Disp: 21  capsule, Rfl: 0 .  losartan (COZAAR) 100 MG tablet, Take 1 tablet (100 mg total) by mouth every evening., Disp: 90 tablet, Rfl: 3 .  methocarbamol (ROBAXIN) 500 MG tablet, Take 1 tablet (500 mg total) by mouth every 8 (eight) hours as needed for muscle spasms., Disp: 30 tablet, Rfl: 1 .  metoprolol succinate (TOPROL-XL) 50 MG 24 hr tablet, TAKE 1 TABLET TWICE A DAY, Disp: 180 tablet, Rfl: 3 .  Multiple Vitamin (MULTIVITAMIN) tablet, Take 1 tablet by mouth daily.  , Disp: , Rfl:  .  ondansetron (ZOFRAN) 4 MG tablet, Take 1 tablet (4 mg total) by mouth every 8 (eight) hours as needed for nausea or vomiting., Disp: 30 tablet, Rfl: 3 .  ondansetron (ZOFRAN) 8 MG tablet, Take 1 tablet (8 mg total) by mouth 2 (two) times daily. Start the day after chemo for 2 days. Then take as needed for nausea or vomiting., Disp: 30 tablet, Rfl: 1 .  oxyCODONE-acetaminophen (PERCOCET/ROXICET) 5-325 MG tablet, Take 1 tablet by mouth every 6 (six) hours as needed for severe pain., Disp: 11 tablet, Rfl: 0 .  pravastatin (PRAVACHOL) 40 MG tablet, TAKE 1 TABLET DAILY, Disp: 90 tablet, Rfl: 2 .  prochlorperazine (COMPAZINE) 10 MG tablet, Take 1 tablet (10 mg total) by mouth every 6 (six) hours as needed (Nausea or vomiting)., Disp: 30 tablet, Rfl:  1 .  vitamin C (ASCORBIC ACID) 500 MG tablet, Take 500 mg by mouth daily.  , Disp: , Rfl:   Allergies:  Allergies  Allergen Reactions  . Bee Venom Swelling  . Ceftin [Cefuroxime Axetil]     Felt wierd  . Spider Antivenin [Antivenin Latrodectus Mactans] Other (See Comments)    pain    Past Medical History, Surgical history, Social history, and Family History were reviewed and updated.  Review of Systems: As above  Physical Exam:  height is 5\' 7"  (1.702 m) and weight is 217 lb (98.431 kg). Her oral temperature is 98.3 F (36.8 C). Her blood pressure is 134/71 and her pulse is 77. Her respiration is 18.   Wt Readings from Last 3 Encounters:  07/09/15 217 lb (98.431 kg)    07/06/15 222 lb (100.699 kg)  06/25/15 223 lb (101.152 kg)     Elderly but fairly well-nourished African-American female in no obvious distress. Head and neck exam shows no ocular or oral lesions. She has no palpable cervical or supraclavicular lymph nodes. Lungs are clear bilaterally. Cardiac exam regular rate and rhythm with no murmurs, rubs or bruits. Abdomen is soft. She has good bowel sounds. There is no fluid wave. There is no palpable liver or spleen tip. Back exam shows no tenderness over the spine, ribs or hips. She has no kyphosis. Skin exam shows the vesicular lesions in the left T2 dermatome. Neurological exam shows no focal neurological deficits.  Lab Results  Component Value Date   WBC 4.7 07/09/2015   HGB 10.2* 07/09/2015   HCT 31.3* 07/09/2015   MCV 92 07/09/2015   PLT 245 07/09/2015     Chemistry      Component Value Date/Time   NA 137 06/18/2015 1215   NA 141 06/01/2015 0949   NA 139 05/07/2015 1628   K 3.5 06/18/2015 1215   K 3.6 06/01/2015 0949   K 3.7 05/07/2015 1628   CL 103 06/18/2015 1215   CL 104 05/07/2015 1628   CO2 28 06/18/2015 1215   CO2 23 06/01/2015 0949   CO2 30 05/07/2015 1628   BUN 9 06/18/2015 1215   BUN 14.7 06/01/2015 0949   BUN 11 05/07/2015 1628   CREATININE 0.6 06/18/2015 1215   CREATININE 0.8 06/01/2015 0949   CREATININE 0.70 05/07/2015 1628      Component Value Date/Time   CALCIUM 8.1 06/18/2015 1215   CALCIUM 8.8 06/01/2015 0949   CALCIUM 9.5 05/07/2015 1628   CALCIUM 9.5 10/02/2011 1601   ALKPHOS 47 06/18/2015 1215   ALKPHOS 74 06/01/2015 0949   ALKPHOS 72 05/07/2015 1628   AST 20 06/18/2015 1215   AST 19 06/01/2015 0949   AST 15 05/07/2015 1628   ALT 15 06/18/2015 1215   ALT 13 06/01/2015 0949   ALT 12 05/07/2015 1628   BILITOT 1.00 06/18/2015 1215   BILITOT 0.83 06/01/2015 0949   BILITOT 0.6 05/07/2015 1628         Impression and Plan: Ms. Rougier is a 82 year old African-American female. She certainly looks  younger. She has IgG lambda myeloma. She has 2 chromosomal abnormalities by FISH.  I will continue her on treatment. I like to see that her myeloma numbers should be better. I suspect that they probably will be better.  We will go ahead and plan to get her back in one more month. This will be her third cycle  I told her that she has to keep taking the Famvir as long she  is on treatment. This will decrease her risk of shingles.    [name], MD 2/13/201712:31 PM

## 2015-07-09 NOTE — Patient Instructions (Signed)
[facility] Discharge Instructions for Patients Receiving Chemotherapy  Today you received the following chemotherapy agents Velcade  To help prevent nausea and vomiting after your treatment, we encourage you to take your nausea medication    If you develop nausea and vomiting that is not controlled by your nausea medication, call the clinic.   BELOW ARE SYMPTOMS THAT SHOULD BE REPORTED IMMEDIATELY:  *FEVER GREATER THAN 100.5 F  *CHILLS WITH OR WITHOUT FEVER  NAUSEA AND VOMITING THAT IS NOT CONTROLLED WITH YOUR NAUSEA MEDICATION  *UNUSUAL SHORTNESS OF BREATH  *UNUSUAL BRUISING OR BLEEDING  TENDERNESS IN MOUTH AND THROAT WITH OR WITHOUT PRESENCE OF ULCERS  *URINARY PROBLEMS  *BOWEL PROBLEMS  UNUSUAL RASH Items with * indicate a potential emergency and should be followed up as soon as possible.  Feel free to call the clinic you have any questions or concerns. The clinic phone number is [phone].  Please show the CHEMO ALERT CARD at check-in to the Emergency Department and triage nurse.   

## 2015-07-10 LAB — KAPPA/LAMBDA LIGHT CHAINS
IG KAPPA FREE LIGHT CHAIN: 44.87 mg/L — AB (ref 3.30–19.40)
IG LAMBDA FREE LIGHT CHAIN: 24.83 mg/L (ref 5.71–26.30)
Kappa/Lambda FluidC Ratio: 1.81 — ABNORMAL HIGH (ref 0.26–1.65)

## 2015-07-10 LAB — IGG, IGA, IGM
IgA, Qn, Serum: 73 mg/dL (ref 64–422)
IgG, Qn, Serum: 2181 mg/dL — ABNORMAL HIGH (ref 700–1600)
IgM, Qn, Serum: 44 mg/dL (ref 26–217)

## 2015-07-10 LAB — BETA 2 MICROGLOBULIN, SERUM: BETA 2: 2.5 mg/L — AB (ref 0.6–2.4)

## 2015-07-11 LAB — PROTEIN ELECTROPHORESIS, SERUM, WITH REFLEX
A/G Ratio: 0.8 (ref 0.7–1.7)
ALPHA 1: 0.3 g/dL (ref 0.0–0.4)
ALPHA 2: 0.9 g/dL (ref 0.4–1.0)
Albumin: 3.2 g/dL (ref 2.9–4.4)
BETA: 1 g/dL (ref 0.7–1.3)
GAMMA GLOBULIN: 1.9 g/dL — AB (ref 0.4–1.8)
Globulin, Total: 4.2 g/dL — ABNORMAL HIGH (ref 2.2–3.9)
Interpretation(See Below): 0
M-SPIKE, %: 1.6 g/dL — AB
Total Protein: 7.4 g/dL (ref 6.0–8.5)

## 2015-07-16 ENCOUNTER — Ambulatory Visit (HOSPITAL_BASED_OUTPATIENT_CLINIC_OR_DEPARTMENT_OTHER): Payer: Medicare Other

## 2015-07-16 VITALS — BP 155/70 | HR 68 | Temp 98.0°F | Resp 18

## 2015-07-16 DIAGNOSIS — Z5112 Encounter for antineoplastic immunotherapy: Secondary | ICD-10-CM | POA: Diagnosis not present

## 2015-07-16 DIAGNOSIS — C9000 Multiple myeloma not having achieved remission: Secondary | ICD-10-CM | POA: Diagnosis not present

## 2015-07-16 MED ORDER — BORTEZOMIB CHEMO SQ INJECTION 3.5 MG (2.5MG/ML)
1.3000 mg/m2 | Freq: Once | INTRAMUSCULAR | Status: AC
  Administered 2015-07-16: 2.75 mg via SUBCUTANEOUS
  Filled 2015-07-16: qty 2.75

## 2015-07-16 MED ORDER — ONDANSETRON HCL 8 MG PO TABS
ORAL_TABLET | ORAL | Status: AC
  Filled 2015-07-16: qty 1

## 2015-07-16 MED ORDER — ONDANSETRON HCL 8 MG PO TABS
8.0000 mg | ORAL_TABLET | Freq: Once | ORAL | Status: AC
  Administered 2015-07-16: 8 mg via ORAL

## 2015-07-16 NOTE — Patient Instructions (Signed)
Bortezomib injection What is this medicine? BORTEZOMIB (bor TEZ oh mib) is a medicine that targets proteins in cancer cells and stops the cancer cells from growing. It is used to treat multiple myeloma and mantle-cell lymphoma. This medicine may be used for other purposes; ask your health care provider or pharmacist if you have questions. What should I tell my health care provider before I take this medicine? They need to know if you have any of these conditions: -diabetes -heart disease -irregular heartbeat -liver disease -on hemodialysis -low blood counts, like low white blood cells, platelets, or hemoglobin -peripheral neuropathy -taking medicine for blood pressure -an unusual or allergic reaction to bortezomib, mannitol, boron, other medicines, foods, dyes, or preservatives -pregnant or trying to get pregnant -breast-feeding How should I use this medicine? This medicine is for injection into a vein or for injection under the skin. It is given by a health care professional in a hospital or clinic setting. Talk to your pediatrician regarding the use of this medicine in children. Special care may be needed. Overdosage: If you think you have taken too much of this medicine contact a poison control center or emergency room at once. NOTE: This medicine is only for you. Do not share this medicine with others. What if I miss a dose? It is important not to miss your dose. Call your doctor or health care professional if you are unable to keep an appointment. What may interact with this medicine? This medicine may interact with the following medications: -ketoconazole -rifampin -ritonavir -St. John's Wort This list may not describe all possible interactions. Give your health care provider a list of all the medicines, herbs, non-prescription drugs, or dietary supplements you use. Also tell them if you smoke, drink alcohol, or use illegal drugs. Some items may interact with your medicine. What  should I watch for while using this medicine? Visit your doctor for checks on your progress. This drug may make you feel generally unwell. This is not uncommon, as chemotherapy can affect healthy cells as well as cancer cells. Report any side effects. Continue your course of treatment even though you feel ill unless your doctor tells you to stop. You may get drowsy or dizzy. Do not drive, use machinery, or do anything that needs mental alertness until you know how this medicine affects you. Do not stand or sit up quickly, especially if you are an older patient. This reduces the risk of dizzy or fainting spells. In some cases, you may be given additional medicines to help with side effects. Follow all directions for their use. Call your doctor or health care professional for advice if you get a fever, chills or sore throat, or other symptoms of a cold or flu. Do not treat yourself. This drug decreases your body's ability to fight infections. Try to avoid being around people who are sick. This medicine may increase your risk to bruise or bleed. Call your doctor or health care professional if you notice any unusual bleeding. You may need blood work done while you are taking this medicine. In some patients, this medicine may cause a serious brain infection that may cause death. If you have any problems seeing, thinking, speaking, walking, or standing, tell your doctor right away. If you cannot reach your doctor, urgently seek other source of medical care. Do not become pregnant while taking this medicine. Women should inform their doctor if they wish to become pregnant or think they might be pregnant. There is a potential for serious  side effects to an unborn child. Talk to your health care professional or pharmacist for more information. Do not breast-feed an infant while taking this medicine. Check with your doctor or health care professional if you get an attack of severe diarrhea, nausea and vomiting, or if  you sweat a lot. The loss of too much body fluid can make it dangerous for you to take this medicine. What side effects may I notice from receiving this medicine? Side effects that you should report to your doctor or health care professional as soon as possible: -allergic reactions like skin rash, itching or hives, swelling of the face, lips, or tongue -breathing problems -changes in hearing -changes in vision -fast, irregular heartbeat -feeling faint or lightheaded, falls -pain, tingling, numbness in the hands or feet -right upper belly pain -seizures -swelling of the ankles, feet, hands -unusual bleeding or bruising -unusually weak or tired -vomiting -yellowing of the eyes or skin Side effects that usually do not require medical attention (report to your doctor or health care professional if they continue or are bothersome): -changes in emotions or moods -constipation -diarrhea -loss of appetite -headache -irritation at site where injected -nausea This list may not describe all possible side effects. Call your doctor for medical advice about side effects. You may report side effects to FDA at 1-800-FDA-1088. Where should I keep my medicine? This drug is given in a hospital or clinic and will not be stored at home. NOTE: This sheet is a summary. It may not cover all possible information. If you have questions about this medicine, talk to your doctor, pharmacist, or health care provider.    2016, Elsevier/Gold Standard. (2014-07-11 14:47:04)

## 2015-07-23 ENCOUNTER — Ambulatory Visit (HOSPITAL_BASED_OUTPATIENT_CLINIC_OR_DEPARTMENT_OTHER): Payer: Medicare Other

## 2015-07-23 VITALS — BP 156/69 | HR 66 | Temp 98.0°F | Resp 16

## 2015-07-23 DIAGNOSIS — C9000 Multiple myeloma not having achieved remission: Secondary | ICD-10-CM

## 2015-07-23 DIAGNOSIS — Z5112 Encounter for antineoplastic immunotherapy: Secondary | ICD-10-CM | POA: Diagnosis not present

## 2015-07-23 MED ORDER — BORTEZOMIB CHEMO SQ INJECTION 3.5 MG (2.5MG/ML)
1.3000 mg/m2 | Freq: Once | INTRAMUSCULAR | Status: AC
  Administered 2015-07-23: 2.75 mg via SUBCUTANEOUS
  Filled 2015-07-23: qty 2.75

## 2015-07-23 MED ORDER — ONDANSETRON HCL 8 MG PO TABS
8.0000 mg | ORAL_TABLET | Freq: Once | ORAL | Status: AC
  Administered 2015-07-23: 8 mg via ORAL

## 2015-07-23 MED ORDER — ONDANSETRON HCL 8 MG PO TABS
ORAL_TABLET | ORAL | Status: AC
  Filled 2015-07-23: qty 1

## 2015-07-23 NOTE — Patient Instructions (Signed)
Bortezomib injection What is this medicine? BORTEZOMIB (bor TEZ oh mib) is a medicine that targets proteins in cancer cells and stops the cancer cells from growing. It is used to treat multiple myeloma and mantle-cell lymphoma. This medicine may be used for other purposes; ask your health care provider or pharmacist if you have questions. What should I tell my health care provider before I take this medicine? They need to know if you have any of these conditions: -diabetes -heart disease -irregular heartbeat -liver disease -on hemodialysis -low blood counts, like low white blood cells, platelets, or hemoglobin -peripheral neuropathy -taking medicine for blood pressure -an unusual or allergic reaction to bortezomib, mannitol, boron, other medicines, foods, dyes, or preservatives -pregnant or trying to get pregnant -breast-feeding How should I use this medicine? This medicine is for injection into a vein or for injection under the skin. It is given by a health care professional in a hospital or clinic setting. Talk to your pediatrician regarding the use of this medicine in children. Special care may be needed. Overdosage: If you think you have taken too much of this medicine contact a poison control center or emergency room at once. NOTE: This medicine is only for you. Do not share this medicine with others. What if I miss a dose? It is important not to miss your dose. Call your doctor or health care professional if you are unable to keep an appointment. What may interact with this medicine? This medicine may interact with the following medications: -ketoconazole -rifampin -ritonavir -St. John's Wort This list may not describe all possible interactions. Give your health care provider a list of all the medicines, herbs, non-prescription drugs, or dietary supplements you use. Also tell them if you smoke, drink alcohol, or use illegal drugs. Some items may interact with your medicine. What  should I watch for while using this medicine? Visit your doctor for checks on your progress. This drug may make you feel generally unwell. This is not uncommon, as chemotherapy can affect healthy cells as well as cancer cells. Report any side effects. Continue your course of treatment even though you feel ill unless your doctor tells you to stop. You may get drowsy or dizzy. Do not drive, use machinery, or do anything that needs mental alertness until you know how this medicine affects you. Do not stand or sit up quickly, especially if you are an older patient. This reduces the risk of dizzy or fainting spells. In some cases, you may be given additional medicines to help with side effects. Follow all directions for their use. Call your doctor or health care professional for advice if you get a fever, chills or sore throat, or other symptoms of a cold or flu. Do not treat yourself. This drug decreases your body's ability to fight infections. Try to avoid being around people who are sick. This medicine may increase your risk to bruise or bleed. Call your doctor or health care professional if you notice any unusual bleeding. You may need blood work done while you are taking this medicine. In some patients, this medicine may cause a serious brain infection that may cause death. If you have any problems seeing, thinking, speaking, walking, or standing, tell your doctor right away. If you cannot reach your doctor, urgently seek other source of medical care. Do not become pregnant while taking this medicine. Women should inform their doctor if they wish to become pregnant or think they might be pregnant. There is a potential for serious  side effects to an unborn child. Talk to your health care professional or pharmacist for more information. Do not breast-feed an infant while taking this medicine. Check with your doctor or health care professional if you get an attack of severe diarrhea, nausea and vomiting, or if  you sweat a lot. The loss of too much body fluid can make it dangerous for you to take this medicine. What side effects may I notice from receiving this medicine? Side effects that you should report to your doctor or health care professional as soon as possible: -allergic reactions like skin rash, itching or hives, swelling of the face, lips, or tongue -breathing problems -changes in hearing -changes in vision -fast, irregular heartbeat -feeling faint or lightheaded, falls -pain, tingling, numbness in the hands or feet -right upper belly pain -seizures -swelling of the ankles, feet, hands -unusual bleeding or bruising -unusually weak or tired -vomiting -yellowing of the eyes or skin Side effects that usually do not require medical attention (report to your doctor or health care professional if they continue or are bothersome): -changes in emotions or moods -constipation -diarrhea -loss of appetite -headache -irritation at site where injected -nausea This list may not describe all possible side effects. Call your doctor for medical advice about side effects. You may report side effects to FDA at 1-800-FDA-1088. Where should I keep my medicine? This drug is given in a hospital or clinic and will not be stored at home. NOTE: This sheet is a summary. It may not cover all possible information. If you have questions about this medicine, talk to your doctor, pharmacist, or health care provider.    2016, Elsevier/Gold Standard. (2014-07-11 14:47:04)

## 2015-07-30 ENCOUNTER — Other Ambulatory Visit: Payer: Self-pay | Admitting: *Deleted

## 2015-07-30 DIAGNOSIS — C9000 Multiple myeloma not having achieved remission: Secondary | ICD-10-CM

## 2015-07-30 MED ORDER — LENALIDOMIDE 20 MG PO CAPS: ORAL_CAPSULE | ORAL | Status: DC

## 2015-08-06 ENCOUNTER — Ambulatory Visit (HOSPITAL_BASED_OUTPATIENT_CLINIC_OR_DEPARTMENT_OTHER): Payer: Medicare Other

## 2015-08-06 ENCOUNTER — Ambulatory Visit (HOSPITAL_BASED_OUTPATIENT_CLINIC_OR_DEPARTMENT_OTHER): Payer: Medicare Other | Admitting: Family

## 2015-08-06 ENCOUNTER — Ambulatory Visit: Payer: Medicare Other | Admitting: Internal Medicine

## 2015-08-06 ENCOUNTER — Encounter: Payer: Self-pay | Admitting: Family

## 2015-08-06 ENCOUNTER — Other Ambulatory Visit (HOSPITAL_BASED_OUTPATIENT_CLINIC_OR_DEPARTMENT_OTHER): Payer: Medicare Other

## 2015-08-06 VITALS — BP 149/66 | HR 71 | Temp 98.2°F | Resp 18 | Ht 67.0 in | Wt 213.0 lb

## 2015-08-06 DIAGNOSIS — C9000 Multiple myeloma not having achieved remission: Secondary | ICD-10-CM

## 2015-08-06 DIAGNOSIS — Z5112 Encounter for antineoplastic immunotherapy: Secondary | ICD-10-CM | POA: Diagnosis not present

## 2015-08-06 DIAGNOSIS — G629 Polyneuropathy, unspecified: Secondary | ICD-10-CM | POA: Diagnosis not present

## 2015-08-06 LAB — CBC WITH DIFFERENTIAL (CANCER CENTER ONLY)
BASO#: 0.1 10*3/uL (ref 0.0–0.2)
BASO%: 1.7 % (ref 0.0–2.0)
EOS%: 3.6 % (ref 0.0–7.0)
Eosinophils Absolute: 0.1 10*3/uL (ref 0.0–0.5)
HCT: 32.9 % — ABNORMAL LOW (ref 34.8–46.6)
HGB: 10.8 g/dL — ABNORMAL LOW (ref 11.6–15.9)
LYMPH#: 2 10*3/uL (ref 0.9–3.3)
LYMPH%: 54.4 % — AB (ref 14.0–48.0)
MCH: 29.9 pg (ref 26.0–34.0)
MCHC: 32.8 g/dL (ref 32.0–36.0)
MCV: 91 fL (ref 81–101)
MONO#: 0.4 10*3/uL (ref 0.1–0.9)
MONO%: 10.6 % (ref 0.0–13.0)
NEUT#: 1.1 10*3/uL — ABNORMAL LOW (ref 1.5–6.5)
NEUT%: 29.7 % — AB (ref 39.6–80.0)
PLATELETS: 224 10*3/uL (ref 145–400)
RBC: 3.61 10*6/uL — AB (ref 3.70–5.32)
RDW: 14.3 % (ref 11.1–15.7)
WBC: 3.6 10*3/uL — AB (ref 3.9–10.0)

## 2015-08-06 LAB — CMP (CANCER CENTER ONLY)
ALK PHOS: 70 U/L (ref 26–84)
ALT: 20 U/L (ref 10–47)
AST: 23 U/L (ref 11–38)
Albumin: 3.4 g/dL (ref 3.3–5.5)
BUN: 11 mg/dL (ref 7–22)
CO2: 28 mEq/L (ref 18–33)
CREATININE: 1.1 mg/dL (ref 0.6–1.2)
Calcium: 9.2 mg/dL (ref 8.0–10.3)
Chloride: 107 mEq/L (ref 98–108)
Glucose, Bld: 104 mg/dL (ref 73–118)
POTASSIUM: 3.9 meq/L (ref 3.3–4.7)
SODIUM: 140 meq/L (ref 128–145)
Total Bilirubin: 1.3 mg/dl (ref 0.20–1.60)
Total Protein: 7.1 g/dL (ref 6.4–8.1)

## 2015-08-06 MED ORDER — ONDANSETRON HCL 8 MG PO TABS
8.0000 mg | ORAL_TABLET | Freq: Once | ORAL | Status: AC
  Administered 2015-08-06: 8 mg via ORAL

## 2015-08-06 MED ORDER — ONDANSETRON HCL 8 MG PO TABS
ORAL_TABLET | ORAL | Status: AC
  Filled 2015-08-06: qty 1

## 2015-08-06 MED ORDER — BORTEZOMIB CHEMO SQ INJECTION 3.5 MG (2.5MG/ML)
1.3000 mg/m2 | Freq: Once | INTRAMUSCULAR | Status: AC
  Administered 2015-08-06: 2.75 mg via SUBCUTANEOUS
  Filled 2015-08-06: qty 2.75

## 2015-08-06 NOTE — Progress Notes (Signed)
Hematology and Oncology Follow Up Visit  Mindy Taylor [id number] 05-09-1929 82 y.o. 08/06/2015   Principle Diagnosis:  IgG Lambda myeloma -- 11q- and t(11:14)   Of Note: JEHOVAH'S WITNESS  Current Therapy:   S/p cycle 6 of Velcade/Revlimid    Interim History:  Mindy Taylor is here today for a follow-up and treatment. She is doing quite well and has no complaints at this time.  No fatigue, fever, chills, n/v, cough, rash, dizziness, headache, vision changes, SOB, chest pain, palpitations, abdominal pain or changes in bowel or bladder habits.  She had one episode of diarrhea after "eating too much." This resolved quickly and she has had no other issues.  No swelling or tenderness in her extremities. She has some mild numbness and tingling in a few fingers of the right hand. She describes this as mild and tolerable. It has not effected her dexterity.  She has maintained a healthy appetite and is staying well hydrated. Her weight is down 4 lbs which she is very happy about. She is staying active walking.   Medications:    Medication List       This list is accurate as of: 08/06/15 11:48 AM.  Always use your most recent med list.               amLODipine 5 MG tablet  Commonly known as:  NORVASC  TAKE 1 TABLET DAILY     clindamycin 150 MG capsule  Commonly known as:  CLEOCIN  Take 1 capsule (150 mg total) by mouth 4 (four) times daily.     famciclovir 500 MG tablet  Commonly known as:  FAMVIR  Please take 1 pill 3 times a day for 7 days and then take 1 pill a day indefinitely.     fish oil-omega-3 fatty acids 1000 MG capsule  Take 1 g by mouth daily.     ibuprofen 200 MG tablet  Commonly known as:  ADVIL  Take 1-2 tablets (200-400 mg total) by mouth every 8 (eight) hours as needed for moderate pain.     lenalidomide 20 MG capsule  Commonly known as:  REVLIMID  Take daily for 21 days. Off for 7 days. Repeat every 28 days. [id number]     losartan 100 MG tablet    Commonly known as:  COZAAR  Take 1 tablet (100 mg total) by mouth every evening.     methocarbamol 500 MG tablet  Commonly known as:  ROBAXIN  Take 1 tablet (500 mg total) by mouth every 8 (eight) hours as needed for muscle spasms.     metoprolol succinate 50 MG 24 hr tablet  Commonly known as:  TOPROL-XL  TAKE 1 TABLET TWICE A DAY     multivitamin tablet  Take 1 tablet by mouth daily.     ondansetron 4 MG tablet  Commonly known as:  ZOFRAN  Take 1 tablet (4 mg total) by mouth every 8 (eight) hours as needed for nausea or vomiting.     ondansetron 8 MG tablet  Commonly known as:  ZOFRAN  Take 1 tablet (8 mg total) by mouth 2 (two) times daily. Start the day after chemo for 2 days. Then take as needed for nausea or vomiting.     oxyCODONE-acetaminophen 5-325 MG tablet  Commonly known as:  PERCOCET/ROXICET  Take 1 tablet by mouth every 6 (six) hours as needed for severe pain.     pravastatin 40 MG tablet  Commonly known as:  PRAVACHOL  TAKE 1  TABLET DAILY     prochlorperazine 10 MG tablet  Commonly known as:  COMPAZINE  Take 1 tablet (10 mg total) by mouth every 6 (six) hours as needed (Nausea or vomiting).     vitamin C 500 MG tablet  Commonly known as:  ASCORBIC ACID  Take 500 mg by mouth daily.     Vitamin D 1000 units capsule  Take 1 capsule (1,000 Units total) by mouth daily.        Allergies:  Allergies  Allergen Reactions  . Bee Venom Swelling  . Ceftin [Cefuroxime Axetil]     Felt wierd  . Spider Antivenin [Antivenin Latrodectus Mactans] Other (See Comments)    pain    Past Medical History, Surgical history, Social history, and Family History were reviewed and updated.  Review of Systems: All other 10 point review of systems is negative.   Physical Exam:  vitals were not taken for this visit.  Wt Readings from Last 3 Encounters:  07/09/15 217 lb (98.431 kg)  07/06/15 222 lb (100.699 kg)  06/25/15 223 lb (101.152 kg)    Ocular: Sclerae  unicteric, pupils equal, round and reactive to light Ear-nose-throat: Oropharynx clear, dentition fair Lymphatic: No cervical supraclavicular or axillary adenopathy Lungs no rales or rhonchi, good excursion bilaterally Heart regular rate and rhythm, no murmur appreciated Abd soft, nontender, positive bowel sounds, no liver or spleen palpated on exam, no fluid wave.  MSK no focal spinal tenderness, no joint edema Neuro: non-focal, well-oriented, appropriate affect Breasts: Deferred  Lab Results  Component Value Date   WBC 3.6* 08/06/2015   HGB 10.8* 08/06/2015   HCT 32.9* 08/06/2015   MCV 91 08/06/2015   PLT 224 08/06/2015   No results found for: FERRITIN, IRON, TIBC, UIBC, IRONPCTSAT Lab Results  Component Value Date   RBC 3.61* 08/06/2015   Lab Results  Component Value Date   KPAFRELGTCHN 2.72* 06/01/2015   LAMBDASER 1.83 06/01/2015   KAPLAMBRATIO 1.81* 07/09/2015   Lab Results  Component Value Date   IGGSERUM 2,181* 07/09/2015   IGA 84 06/01/2015   IGMSERUM 44 07/09/2015   Lab Results  Component Value Date   TOTALPROTELP 7.7 06/01/2015   ALBUMINELP 3.7* 06/01/2015   A1GS 0.3 06/01/2015   A2GS 0.9 06/01/2015   BETS 0.4 06/01/2015   BETA2SER 0.3 06/01/2015   GAMS 2.0* 06/01/2015   MSPIKE 1.6* 07/09/2015   SPEI * 06/01/2015     Chemistry      Component Value Date/Time   NA 138 07/09/2015 1139   NA 137 06/18/2015 1215   NA 139 05/07/2015 1628   K 3.6 07/09/2015 1139   K 3.5 06/18/2015 1215   K 3.7 05/07/2015 1628   CL 103 06/18/2015 1215   CL 104 05/07/2015 1628   CO2 25 07/09/2015 1139   CO2 28 06/18/2015 1215   CO2 30 05/07/2015 1628   BUN 14.9 07/09/2015 1139   BUN 9 06/18/2015 1215   BUN 11 05/07/2015 1628   CREATININE 0.8 07/09/2015 1139   CREATININE 0.6 06/18/2015 1215   CREATININE 0.70 05/07/2015 1628      Component Value Date/Time   CALCIUM 8.8 07/09/2015 1139   CALCIUM 8.1 06/18/2015 1215   CALCIUM 9.5 05/07/2015 1628   CALCIUM 9.5  10/02/2011 1601   ALKPHOS 68 07/09/2015 1139   ALKPHOS 47 06/18/2015 1215   ALKPHOS 72 05/07/2015 1628   AST 19 07/09/2015 1139   AST 20 06/18/2015 1215   AST 15 05/07/2015 1628   ALT  19 07/09/2015 1139   ALT 15 06/18/2015 1215   ALT 12 05/07/2015 1628   BILITOT 0.81 07/09/2015 1139   BILITOT 1.00 06/18/2015 1215   BILITOT 0.6 05/07/2015 1628     Impression and Plan: Mindy Taylor is an 82 yo African American female with IgG lambda myeloma - 2 chromosomal abnormalities by FISH. She has done well so far on Revlimid and velcade. She is having mild neuropathy in a few fingers of the right hand that is tolerable at this time. We will continue to monitor this.  She is currently on her week off with Revlimid. She will receive Velcade today as planned.  She is taking her Famvir daily, no rashes.  Her M-spike in February was 1.6 g/dL and IgG level 2,181. Her results today are pending.  Hgb is stable at 10.8, no thrombocytopenia. WBC count is 3.6 and no problem with infection at this time.  She has her current treatment and appointment schedule and we will plan to see her back on April 10th.  She will contact us with any questions or concerns. We can certainly see her sooner if need be.    Eliezer Bottom, NP 3/13/201711:48 AM

## 2015-08-06 NOTE — Patient Instructions (Signed)
[facility] Discharge Instructions for Patients Receiving Chemotherapy  Today you received the following chemotherapy agents Velcade  To help prevent nausea and vomiting after your treatment, we encourage you to take your nausea medication    If you develop nausea and vomiting that is not controlled by your nausea medication, call the clinic.   BELOW ARE SYMPTOMS THAT SHOULD BE REPORTED IMMEDIATELY:  *FEVER GREATER THAN 100.5 F  *CHILLS WITH OR WITHOUT FEVER  NAUSEA AND VOMITING THAT IS NOT CONTROLLED WITH YOUR NAUSEA MEDICATION  *UNUSUAL SHORTNESS OF BREATH  *UNUSUAL BRUISING OR BLEEDING  TENDERNESS IN MOUTH AND THROAT WITH OR WITHOUT PRESENCE OF ULCERS  *URINARY PROBLEMS  *BOWEL PROBLEMS  UNUSUAL RASH Items with * indicate a potential emergency and should be followed up as soon as possible.  Feel free to call the clinic you have any questions or concerns. The clinic phone number is [phone].  Please show the CHEMO ALERT CARD at check-in to the Emergency Department and triage nurse.   

## 2015-08-07 ENCOUNTER — Ambulatory Visit: Payer: Medicare Other

## 2015-08-07 ENCOUNTER — Telehealth: Payer: Self-pay | Admitting: *Deleted

## 2015-08-07 LAB — KAPPA/LAMBDA LIGHT CHAINS
Ig Kappa Free Light Chain: 48.78 mg/L — ABNORMAL HIGH (ref 3.30–19.40)
Ig Lambda Free Light Chain: 27.54 mg/L — ABNORMAL HIGH (ref 5.71–26.30)
Kappa/Lambda FluidC Ratio: 1.77 — ABNORMAL HIGH (ref 0.26–1.65)

## 2015-08-07 LAB — IGG, IGA, IGM
IGM (IMMUNOGLOBIN M), SRM: 49 mg/dL (ref 26–217)
IgA, Qn, Serum: 78 mg/dL (ref 64–422)
IgG, Qn, Serum: 2010 mg/dL — ABNORMAL HIGH (ref 700–1600)

## 2015-08-07 NOTE — Telephone Encounter (Addendum)
Patient is aware of results  ----- Message from [name], MD sent at 08/07/2015  6:46 AM EDT ----- Call - myeloma level is still getting better!!!   Great job!!!  Laurey Arrow

## 2015-08-09 LAB — PROTEIN ELECTROPHORESIS, SERUM, WITH REFLEX
A/G Ratio: 0.9 (ref 0.7–1.7)
ALPHA 1: 0.3 g/dL (ref 0.0–0.4)
Albumin: 3.3 g/dL (ref 2.9–4.4)
Alpha 2: 0.8 g/dL (ref 0.4–1.0)
Beta: 1 g/dL (ref 0.7–1.3)
GAMMA GLOBULIN: 1.7 g/dL (ref 0.4–1.8)
Globulin, Total: 3.7 g/dL (ref 2.2–3.9)
Interpretation(See Below): 0
M-Spike, %: 1.2 g/dL — ABNORMAL HIGH
Total Protein: 7 g/dL (ref 6.0–8.5)

## 2015-08-10 ENCOUNTER — Encounter: Payer: Self-pay | Admitting: Internal Medicine

## 2015-08-10 ENCOUNTER — Ambulatory Visit (INDEPENDENT_AMBULATORY_CARE_PROVIDER_SITE_OTHER): Payer: Medicare Other | Admitting: Internal Medicine

## 2015-08-10 VITALS — BP 130/70 | HR 78 | Temp 98.5°F | Ht 67.0 in | Wt 212.0 lb

## 2015-08-10 DIAGNOSIS — C9000 Multiple myeloma not having achieved remission: Secondary | ICD-10-CM | POA: Diagnosis not present

## 2015-08-10 DIAGNOSIS — I63519 Cerebral infarction due to unspecified occlusion or stenosis of unspecified middle cerebral artery: Secondary | ICD-10-CM | POA: Diagnosis not present

## 2015-08-10 DIAGNOSIS — Z23 Encounter for immunization: Secondary | ICD-10-CM

## 2015-08-10 DIAGNOSIS — R739 Hyperglycemia, unspecified: Secondary | ICD-10-CM | POA: Diagnosis not present

## 2015-08-10 DIAGNOSIS — I10 Essential (primary) hypertension: Secondary | ICD-10-CM | POA: Diagnosis not present

## 2015-08-10 DIAGNOSIS — D649 Anemia, unspecified: Secondary | ICD-10-CM

## 2015-08-10 NOTE — Progress Notes (Signed)
Subjective:  Patient ID: Mindy Taylor, female    DOB: 05/20/1929  Age: 82 y.o. MRN: [id number]  CC: No chief complaint on file.   HPI Myrla L Frady presents for MM, HTN, OA f/u. Pt lost wt  Outpatient Prescriptions Prior to Visit  Medication Sig Dispense Refill  . amLODipine (NORVASC) 5 MG tablet TAKE 1 TABLET DAILY 90 tablet 3  . Cholecalciferol (VITAMIN D) 1000 UNITS capsule Take 1 capsule (1,000 Units total) by mouth daily. 100 capsule 3  . clindamycin (CLEOCIN) 150 MG capsule Take 1 capsule (150 mg total) by mouth 4 (four) times daily. 40 capsule 0  . famciclovir (FAMVIR) 500 MG tablet Please take 1 pill 3 times a day for 7 days and then take 1 pill a day indefinitely. 30 tablet 12  . fish oil-omega-3 fatty acids 1000 MG capsule Take 1 g by mouth daily.      [name] ibuprofen (ADVIL) 200 MG tablet Take 1-2 tablets (200-400 mg total) by mouth every 8 (eight) hours as needed for moderate pain. 30 tablet 0  . lenalidomide (REVLIMID) 20 MG capsule Take daily for 21 days. Off for 7 days. Repeat every 28 days. [id number] 21 capsule 0  . losartan (COZAAR) 100 MG tablet Take 1 tablet (100 mg total) by mouth every evening. 90 tablet 3  . methocarbamol (ROBAXIN) 500 MG tablet Take 1 tablet (500 mg total) by mouth every 8 (eight) hours as needed for muscle spasms. 30 tablet 1  . metoprolol succinate (TOPROL-XL) 50 MG 24 hr tablet TAKE 1 TABLET TWICE A DAY 180 tablet 3  . Multiple Vitamin (MULTIVITAMIN) tablet Take 1 tablet by mouth daily.      . ondansetron (ZOFRAN) 4 MG tablet Take 1 tablet (4 mg total) by mouth every 8 (eight) hours as needed for nausea or vomiting. 30 tablet 3  . ondansetron (ZOFRAN) 8 MG tablet Take 1 tablet (8 mg total) by mouth 2 (two) times daily. Start the day after chemo for 2 days. Then take as needed for nausea or vomiting. 30 tablet 1  . oxyCODONE-acetaminophen (PERCOCET/ROXICET) 5-325 MG tablet Take 1 tablet by mouth every 6 (six) hours as needed for severe pain. 11  tablet 0  . pravastatin (PRAVACHOL) 40 MG tablet TAKE 1 TABLET DAILY 90 tablet 2  . prochlorperazine (COMPAZINE) 10 MG tablet Take 1 tablet (10 mg total) by mouth every 6 (six) hours as needed (Nausea or vomiting). 30 tablet 1  . vitamin C (ASCORBIC ACID) 500 MG tablet Take 500 mg by mouth daily.       No facility-administered medications prior to visit.    ROS Review of Systems  Constitutional: Positive for unexpected weight change. Negative for fever, chills, activity change, appetite change and fatigue.  HENT: Negative for congestion, mouth sores and sinus pressure.   Eyes: Negative for visual disturbance.  Respiratory: Negative for cough and chest tightness.   Gastrointestinal: Negative for nausea and abdominal pain.  Genitourinary: Negative for frequency, difficulty urinating and vaginal pain.  Musculoskeletal: Positive for back pain and arthralgias.  Skin: Negative for pallor and rash.  Neurological: Negative for dizziness, tremors, weakness, numbness and headaches.  Psychiatric/Behavioral: Negative for suicidal ideas, confusion and sleep disturbance. The patient is nervous/anxious.     Objective:  BP 130/70 mmHg  Pulse 78  Temp(Src) 98.5 F (36.9 C)  Ht '5\' 7"'$  (1.702 m)  Wt 212 lb (96.163 kg)  BMI 33.20 kg/m2  SpO2 97%  BP Readings from Last 3 Encounters:  08/10/15 130/70  08/06/15 149/66  07/23/15 156/69    Wt Readings from Last 3 Encounters:  08/10/15 212 lb (96.163 kg)  08/06/15 213 lb (96.616 kg)  07/09/15 217 lb (98.431 kg)    Physical Exam  Constitutional: She appears well-developed. No distress.  HENT:  Head: Normocephalic.  Right Ear: External ear normal.  Left Ear: External ear normal.  Nose: Nose normal.  Mouth/Throat: Oropharynx is clear and moist.  Eyes: Conjunctivae are normal. Pupils are equal, round, and reactive to light. Right eye exhibits no discharge. Left eye exhibits no discharge.  Neck: Normal range of motion. Neck supple. No JVD  present. No tracheal deviation present. No thyromegaly present.  Cardiovascular: Normal rate, regular rhythm and normal heart sounds.   Pulmonary/Chest: No stridor. No respiratory distress. She has no wheezes.  Abdominal: Soft. Bowel sounds are normal. She exhibits no distension and no mass. There is no tenderness. There is no rebound and no guarding.  Musculoskeletal: She exhibits tenderness. She exhibits no edema.  Lymphadenopathy:    She has no cervical adenopathy.  Neurological: She displays normal reflexes. No cranial nerve deficit. She exhibits normal muscle tone. Coordination normal.  Skin: No rash noted. No erythema.  Psychiatric: She has a normal mood and affect. Her behavior is normal. Judgment and thought content normal.    Lab Results  Component Value Date   WBC 3.6* 08/06/2015   HGB 10.8* 08/06/2015   HCT 32.9* 08/06/2015   PLT 224 08/06/2015   GLUCOSE 104 08/06/2015   CHOL 129 09/06/2012   TRIG 63.0 09/06/2012   HDL 33.90* 09/06/2012   LDLCALC 83 09/06/2012   ALT 20 08/06/2015   AST 23 08/06/2015   NA 140 08/06/2015   K 3.9 08/06/2015   CL 107 08/06/2015   CREATININE 1.1 08/06/2015   BUN 11 08/06/2015   CO2 28 08/06/2015   TSH 1.14 09/06/2012   INR 1.04 03/16/2015   HGBA1C 5.7 08/11/2011    Dg Wrist Complete Right  07/06/2015  CLINICAL DATA:  Right hand and wrist pain and swelling for 4 days. EXAM: RIGHT WRIST - COMPLETE 3+ VIEW COMPARISON:  03/14/2015 FINDINGS: Chronically flattened distal head of the first metacarpal. Spurring at the interphalangeal joint of the thumb. Mild generalized bony demineralization. Articular space narrowing between the scaphoid and trapezium. There is a suggestion of soft tissue swelling along the wrist, especially along the volar wrist, but also involving the subcutaneous tissues. IMPRESSION: 1. Soft tissue swelling in the wrist with subcutaneous edema and possible volar soft tissue swelling anterior to the distal radius. Etiology  uncertain. Clinically warranted MRI could be utilized for further workup. 2. Degenerative findings in the lateral carpus and in the thumb. Chronic flattening of the head of the first metacarpal. Electronically Signed   By: Van Clines M.D.   On: 07/06/2015 13:01   Dg Hand Complete Right  07/06/2015  CLINICAL DATA:  Pain and swelling for 4 days EXAM: RIGHT HAND - COMPLETE 3+ VIEW COMPARISON:  March 14, 2015 FINDINGS: Frontal, oblique, and lateral views were obtained. There is a degree of generalized soft tissue swelling. There is no acute fracture or dislocation. There is osteoarthritic change in all PIP and DIP joints as well as in the first MCP and IP joint regions. Osteoarthritic changes also noted in the scaphotrapezial and first carpal -metacarpal joints. Bones are osteoporotic. No erosive change. IMPRESSION: Multifocal osteoarthritic change. Bones appear osteoporotic. There is generalized soft tissue swelling. No acute fracture or dislocation. No erosive change.  Electronically Signed   By: Lowella Grip III M.D.   On: 07/06/2015 13:00    Assessment & Plan:   Diagnoses and all orders for this visit:  Essential hypertension  Cerebrovascular accident (CVA) due to occlusion of middle cerebral artery, unspecified blood vessel laterality ([place])  Multiple myeloma not having achieved remission ([place])  Hyperglycemia  I am having Ms. Bluemel maintain her Vitamin D, multivitamin, vitamin C, fish oil-omega-3 fatty acids, metoprolol succinate, losartan, methocarbamol, amLODipine, pravastatin, ibuprofen, famciclovir, ondansetron, ondansetron, prochlorperazine, oxyCODONE-acetaminophen, clindamycin, and lenalidomide.  No orders of the defined types were placed in this encounter.     Follow-up: Return in about 3 months (around 11/10/2015) for a follow-up visit.  Walker Kehr, MD

## 2015-08-10 NOTE — Assessment & Plan Note (Signed)
Metoprolol, Amlodipine, Losartan 

## 2015-08-10 NOTE — Progress Notes (Signed)
Pre visit review using our clinic review tool, if applicable. No additional management support is needed unless otherwise documented below in the visit note. 

## 2015-08-10 NOTE — Assessment & Plan Note (Signed)
Pt is on Revlimid

## 2015-08-10 NOTE — Assessment & Plan Note (Addendum)
3/17 pt is seeing Dr Marin Olp  Pt is on Revlimid

## 2015-08-10 NOTE — Assessment & Plan Note (Signed)
Losartan, Norvasc, Toprol, ASA No relapse

## 2015-08-10 NOTE — Assessment & Plan Note (Signed)
Labs

## 2015-08-13 ENCOUNTER — Ambulatory Visit (HOSPITAL_BASED_OUTPATIENT_CLINIC_OR_DEPARTMENT_OTHER): Payer: Medicare Other

## 2015-08-13 VITALS — BP 118/60 | HR 67 | Temp 98.2°F | Resp 18

## 2015-08-13 DIAGNOSIS — Z5112 Encounter for antineoplastic immunotherapy: Secondary | ICD-10-CM

## 2015-08-13 DIAGNOSIS — C9000 Multiple myeloma not having achieved remission: Secondary | ICD-10-CM | POA: Diagnosis not present

## 2015-08-13 MED ORDER — ONDANSETRON HCL 8 MG PO TABS
ORAL_TABLET | ORAL | Status: AC
  Filled 2015-08-13: qty 1

## 2015-08-13 MED ORDER — BORTEZOMIB CHEMO SQ INJECTION 3.5 MG (2.5MG/ML)
1.3000 mg/m2 | Freq: Once | INTRAMUSCULAR | Status: AC
  Administered 2015-08-13: 2.75 mg via SUBCUTANEOUS
  Filled 2015-08-13: qty 2.75

## 2015-08-13 MED ORDER — ONDANSETRON HCL 8 MG PO TABS
8.0000 mg | ORAL_TABLET | Freq: Once | ORAL | Status: AC
  Administered 2015-08-13: 8 mg via ORAL

## 2015-08-13 NOTE — Patient Instructions (Signed)
Bortezomib injection What is this medicine? BORTEZOMIB (bor TEZ oh mib) is a medicine that targets proteins in cancer cells and stops the cancer cells from growing. It is used to treat multiple myeloma and mantle-cell lymphoma. This medicine may be used for other purposes; ask your health care provider or pharmacist if you have questions. What should I tell my health care provider before I take this medicine? They need to know if you have any of these conditions: -diabetes -heart disease -irregular heartbeat -liver disease -on hemodialysis -low blood counts, like low white blood cells, platelets, or hemoglobin -peripheral neuropathy -taking medicine for blood pressure -an unusual or allergic reaction to bortezomib, mannitol, boron, other medicines, foods, dyes, or preservatives -pregnant or trying to get pregnant -breast-feeding How should I use this medicine? This medicine is for injection into a vein or for injection under the skin. It is given by a health care professional in a hospital or clinic setting. Talk to your pediatrician regarding the use of this medicine in children. Special care may be needed. Overdosage: If you think you have taken too much of this medicine contact a poison control center or emergency room at once. NOTE: This medicine is only for you. Do not share this medicine with others. What if I miss a dose? It is important not to miss your dose. Call your doctor or health care professional if you are unable to keep an appointment. What may interact with this medicine? This medicine may interact with the following medications: -ketoconazole -rifampin -ritonavir -St. John's Wort This list may not describe all possible interactions. Give your health care provider a list of all the medicines, herbs, non-prescription drugs, or dietary supplements you use. Also tell them if you smoke, drink alcohol, or use illegal drugs. Some items may interact with your medicine. What  should I watch for while using this medicine? Visit your doctor for checks on your progress. This drug may make you feel generally unwell. This is not uncommon, as chemotherapy can affect healthy cells as well as cancer cells. Report any side effects. Continue your course of treatment even though you feel ill unless your doctor tells you to stop. You may get drowsy or dizzy. Do not drive, use machinery, or do anything that needs mental alertness until you know how this medicine affects you. Do not stand or sit up quickly, especially if you are an older patient. This reduces the risk of dizzy or fainting spells. In some cases, you may be given additional medicines to help with side effects. Follow all directions for their use. Call your doctor or health care professional for advice if you get a fever, chills or sore throat, or other symptoms of a cold or flu. Do not treat yourself. This drug decreases your body's ability to fight infections. Try to avoid being around people who are sick. This medicine may increase your risk to bruise or bleed. Call your doctor or health care professional if you notice any unusual bleeding. You may need blood work done while you are taking this medicine. In some patients, this medicine may cause a serious brain infection that may cause death. If you have any problems seeing, thinking, speaking, walking, or standing, tell your doctor right away. If you cannot reach your doctor, urgently seek other source of medical care. Do not become pregnant while taking this medicine. Women should inform their doctor if they wish to become pregnant or think they might be pregnant. There is a potential for serious  side effects to an unborn child. Talk to your health care professional or pharmacist for more information. Do not breast-feed an infant while taking this medicine. Check with your doctor or health care professional if you get an attack of severe diarrhea, nausea and vomiting, or if  you sweat a lot. The loss of too much body fluid can make it dangerous for you to take this medicine. What side effects may I notice from receiving this medicine? Side effects that you should report to your doctor or health care professional as soon as possible: -allergic reactions like skin rash, itching or hives, swelling of the face, lips, or tongue -breathing problems -changes in hearing -changes in vision -fast, irregular heartbeat -feeling faint or lightheaded, falls -pain, tingling, numbness in the hands or feet -right upper belly pain -seizures -swelling of the ankles, feet, hands -unusual bleeding or bruising -unusually weak or tired -vomiting -yellowing of the eyes or skin Side effects that usually do not require medical attention (report to your doctor or health care professional if they continue or are bothersome): -changes in emotions or moods -constipation -diarrhea -loss of appetite -headache -irritation at site where injected -nausea This list may not describe all possible side effects. Call your doctor for medical advice about side effects. You may report side effects to FDA at 1-800-FDA-1088. Where should I keep my medicine? This drug is given in a hospital or clinic and will not be stored at home. NOTE: This sheet is a summary. It may not cover all possible information. If you have questions about this medicine, talk to your doctor, pharmacist, or health care provider.    2016, Elsevier/Gold Standard. (2014-07-11 14:47:04)

## 2015-08-20 ENCOUNTER — Other Ambulatory Visit: Payer: Self-pay

## 2015-08-20 ENCOUNTER — Ambulatory Visit (HOSPITAL_BASED_OUTPATIENT_CLINIC_OR_DEPARTMENT_OTHER): Payer: Medicare Other

## 2015-08-20 ENCOUNTER — Other Ambulatory Visit: Payer: Self-pay | Admitting: *Deleted

## 2015-08-20 VITALS — BP 132/51 | HR 78 | Temp 98.2°F

## 2015-08-20 DIAGNOSIS — Z5112 Encounter for antineoplastic immunotherapy: Secondary | ICD-10-CM | POA: Diagnosis not present

## 2015-08-20 DIAGNOSIS — C9000 Multiple myeloma not having achieved remission: Secondary | ICD-10-CM

## 2015-08-20 LAB — CBC WITH DIFFERENTIAL (CANCER CENTER ONLY)
BASO#: 0 10*3/uL (ref 0.0–0.2)
BASO%: 0.6 % (ref 0.0–2.0)
EOS ABS: 0.4 10*3/uL (ref 0.0–0.5)
EOS%: 10.7 % — AB (ref 0.0–7.0)
HCT: 32.7 % — ABNORMAL LOW (ref 34.8–46.6)
HEMOGLOBIN: 11 g/dL — AB (ref 11.6–15.9)
LYMPH#: 1.8 10*3/uL (ref 0.9–3.3)
LYMPH%: 52.2 % — AB (ref 14.0–48.0)
MCH: 30.3 pg (ref 26.0–34.0)
MCHC: 33.6 g/dL (ref 32.0–36.0)
MCV: 90 fL (ref 81–101)
MONO#: 0.5 10*3/uL (ref 0.1–0.9)
MONO%: 14.8 % — AB (ref 0.0–13.0)
NEUT%: 21.7 % — ABNORMAL LOW (ref 39.6–80.0)
NEUTROS ABS: 0.8 10*3/uL — AB (ref 1.5–6.5)
PLATELETS: 107 10*3/uL — AB (ref 145–400)
RBC: 3.63 10*6/uL — ABNORMAL LOW (ref 3.70–5.32)
RDW: 14.7 % (ref 11.1–15.7)
WBC: 3.5 10*3/uL — ABNORMAL LOW (ref 3.9–10.0)

## 2015-08-20 LAB — CMP (CANCER CENTER ONLY)
ALT(SGPT): 13 U/L (ref 10–47)
AST: 17 U/L (ref 11–38)
Albumin: 3.2 g/dL — ABNORMAL LOW (ref 3.3–5.5)
Alkaline Phosphatase: 50 U/L (ref 26–84)
BILIRUBIN TOTAL: 1.3 mg/dL (ref 0.20–1.60)
BUN: 12 mg/dL (ref 7–22)
CHLORIDE: 104 meq/L (ref 98–108)
CO2: 28 meq/L (ref 18–33)
CREATININE: 1 mg/dL (ref 0.6–1.2)
Calcium: 8.6 mg/dL (ref 8.0–10.3)
GLUCOSE: 83 mg/dL (ref 73–118)
Potassium: 3.9 mEq/L (ref 3.3–4.7)
SODIUM: 138 meq/L (ref 128–145)
Total Protein: 6.7 g/dL (ref 6.4–8.1)

## 2015-08-20 MED ORDER — ONDANSETRON HCL 8 MG PO TABS
8.0000 mg | ORAL_TABLET | Freq: Once | ORAL | Status: AC
  Administered 2015-08-20: 8 mg via ORAL

## 2015-08-20 MED ORDER — LENALIDOMIDE 20 MG PO CAPS: ORAL_CAPSULE | ORAL | Status: DC

## 2015-08-20 MED ORDER — ONDANSETRON HCL 8 MG PO TABS
ORAL_TABLET | ORAL | Status: AC
  Filled 2015-08-20: qty 1

## 2015-08-20 MED ORDER — BORTEZOMIB CHEMO SQ INJECTION 3.5 MG (2.5MG/ML)
1.3000 mg/m2 | Freq: Once | INTRAMUSCULAR | Status: AC
  Administered 2015-08-20: 2.75 mg via SUBCUTANEOUS
  Filled 2015-08-20: qty 2.75

## 2015-08-20 NOTE — Patient Instructions (Signed)
Bortezomib injection What is this medicine? BORTEZOMIB (bor TEZ oh mib) is a medicine that targets proteins in cancer cells and stops the cancer cells from growing. It is used to treat multiple myeloma and mantle-cell lymphoma. This medicine may be used for other purposes; ask your health care provider or pharmacist if you have questions. What should I tell my health care provider before I take this medicine? They need to know if you have any of these conditions: -diabetes -heart disease -irregular heartbeat -liver disease -on hemodialysis -low blood counts, like low white blood cells, platelets, or hemoglobin -peripheral neuropathy -taking medicine for blood pressure -an unusual or allergic reaction to bortezomib, mannitol, boron, other medicines, foods, dyes, or preservatives -pregnant or trying to get pregnant -breast-feeding How should I use this medicine? This medicine is for injection into a vein or for injection under the skin. It is given by a health care professional in a hospital or clinic setting. Talk to your pediatrician regarding the use of this medicine in children. Special care may be needed. Overdosage: If you think you have taken too much of this medicine contact a poison control center or emergency room at once. NOTE: This medicine is only for you. Do not share this medicine with others. What if I miss a dose? It is important not to miss your dose. Call your doctor or health care professional if you are unable to keep an appointment. What may interact with this medicine? This medicine may interact with the following medications: -ketoconazole -rifampin -ritonavir -St. John's Wort This list may not describe all possible interactions. Give your health care provider a list of all the medicines, herbs, non-prescription drugs, or dietary supplements you use. Also tell them if you smoke, drink alcohol, or use illegal drugs. Some items may interact with your medicine. What  should I watch for while using this medicine? Visit your doctor for checks on your progress. This drug may make you feel generally unwell. This is not uncommon, as chemotherapy can affect healthy cells as well as cancer cells. Report any side effects. Continue your course of treatment even though you feel ill unless your doctor tells you to stop. You may get drowsy or dizzy. Do not drive, use machinery, or do anything that needs mental alertness until you know how this medicine affects you. Do not stand or sit up quickly, especially if you are an older patient. This reduces the risk of dizzy or fainting spells. In some cases, you may be given additional medicines to help with side effects. Follow all directions for their use. Call your doctor or health care professional for advice if you get a fever, chills or sore throat, or other symptoms of a cold or flu. Do not treat yourself. This drug decreases your body's ability to fight infections. Try to avoid being around people who are sick. This medicine may increase your risk to bruise or bleed. Call your doctor or health care professional if you notice any unusual bleeding. You may need blood work done while you are taking this medicine. In some patients, this medicine may cause a serious brain infection that may cause death. If you have any problems seeing, thinking, speaking, walking, or standing, tell your doctor right away. If you cannot reach your doctor, urgently seek other source of medical care. Do not become pregnant while taking this medicine. Women should inform their doctor if they wish to become pregnant or think they might be pregnant. There is a potential for serious  side effects to an unborn child. Talk to your health care professional or pharmacist for more information. Do not breast-feed an infant while taking this medicine. Check with your doctor or health care professional if you get an attack of severe diarrhea, nausea and vomiting, or if  you sweat a lot. The loss of too much body fluid can make it dangerous for you to take this medicine. What side effects may I notice from receiving this medicine? Side effects that you should report to your doctor or health care professional as soon as possible: -allergic reactions like skin rash, itching or hives, swelling of the face, lips, or tongue -breathing problems -changes in hearing -changes in vision -fast, irregular heartbeat -feeling faint or lightheaded, falls -pain, tingling, numbness in the hands or feet -right upper belly pain -seizures -swelling of the ankles, feet, hands -unusual bleeding or bruising -unusually weak or tired -vomiting -yellowing of the eyes or skin Side effects that usually do not require medical attention (report to your doctor or health care professional if they continue or are bothersome): -changes in emotions or moods -constipation -diarrhea -loss of appetite -headache -irritation at site where injected -nausea This list may not describe all possible side effects. Call your doctor for medical advice about side effects. You may report side effects to FDA at 1-800-FDA-1088. Where should I keep my medicine? This drug is given in a hospital or clinic and will not be stored at home. NOTE: This sheet is a summary. It may not cover all possible information. If you have questions about this medicine, talk to your doctor, pharmacist, or health care provider.    2016, Elsevier/Gold Standard. (2014-07-11 14:47:04)

## 2015-08-20 NOTE — Progress Notes (Signed)
Per Dr Marin Olp, ok to treat with ANC 0.8. dph

## 2015-08-22 ENCOUNTER — Telehealth: Payer: Self-pay | Admitting: *Deleted

## 2015-08-22 NOTE — Telephone Encounter (Signed)
Patient notifying office that she is no longer willing to take her Revlimid. She has been having gastrointestinal issues including diarrhea which has been unresponsive to multiple treatments. She didn't take her Revlimid for a few days and she states she feels fine off it, so she is now unwilling to restart. She is asking to speak to Dr Marin Olp.  Message given to Dr Marin Olp. He will try to call patient at some point today.

## 2015-08-23 ENCOUNTER — Encounter: Payer: Self-pay | Admitting: Hematology & Oncology

## 2015-09-03 ENCOUNTER — Ambulatory Visit (HOSPITAL_BASED_OUTPATIENT_CLINIC_OR_DEPARTMENT_OTHER): Payer: Medicare Other | Admitting: Hematology & Oncology

## 2015-09-03 ENCOUNTER — Ambulatory Visit (HOSPITAL_BASED_OUTPATIENT_CLINIC_OR_DEPARTMENT_OTHER): Payer: Medicare Other

## 2015-09-03 ENCOUNTER — Other Ambulatory Visit: Payer: Self-pay | Admitting: Nurse Practitioner

## 2015-09-03 ENCOUNTER — Other Ambulatory Visit (HOSPITAL_BASED_OUTPATIENT_CLINIC_OR_DEPARTMENT_OTHER): Payer: Medicare Other

## 2015-09-03 ENCOUNTER — Encounter: Payer: Self-pay | Admitting: Hematology & Oncology

## 2015-09-03 VITALS — BP 165/71 | HR 85 | Temp 98.4°F | Resp 16 | Ht 67.0 in | Wt 213.0 lb

## 2015-09-03 DIAGNOSIS — C9000 Multiple myeloma not having achieved remission: Secondary | ICD-10-CM

## 2015-09-03 DIAGNOSIS — Z5112 Encounter for antineoplastic immunotherapy: Secondary | ICD-10-CM | POA: Diagnosis not present

## 2015-09-03 LAB — CBC WITH DIFFERENTIAL (CANCER CENTER ONLY)
BASO#: 0 10*3/uL (ref 0.0–0.2)
BASO%: 0.7 % (ref 0.0–2.0)
EOS%: 1.6 % (ref 0.0–7.0)
Eosinophils Absolute: 0.1 10*3/uL (ref 0.0–0.5)
HCT: 28.9 % — ABNORMAL LOW (ref 34.8–46.6)
HEMOGLOBIN: 9.7 g/dL — AB (ref 11.6–15.9)
LYMPH#: 1.6 10*3/uL (ref 0.9–3.3)
LYMPH%: 35.4 % (ref 14.0–48.0)
MCH: 30.4 pg (ref 26.0–34.0)
MCHC: 33.6 g/dL (ref 32.0–36.0)
MCV: 91 fL (ref 81–101)
MONO#: 0.6 10*3/uL (ref 0.1–0.9)
MONO%: 14.4 % — AB (ref 0.0–13.0)
NEUT%: 47.9 % (ref 39.6–80.0)
NEUTROS ABS: 2.1 10*3/uL (ref 1.5–6.5)
PLATELETS: 246 10*3/uL (ref 145–400)
RBC: 3.19 10*6/uL — AB (ref 3.70–5.32)
RDW: 15 % (ref 11.1–15.7)
WBC: 4.4 10*3/uL (ref 3.9–10.0)

## 2015-09-03 LAB — CMP (CANCER CENTER ONLY)
ALBUMIN: 3 g/dL — AB (ref 3.3–5.5)
ALK PHOS: 59 U/L (ref 26–84)
ALT: 19 U/L (ref 10–47)
AST: 20 U/L (ref 11–38)
BILIRUBIN TOTAL: 0.8 mg/dL (ref 0.20–1.60)
BUN: 13 mg/dL (ref 7–22)
CHLORIDE: 110 meq/L — AB (ref 98–108)
CO2: 25 mEq/L (ref 18–33)
CREATININE: 0.9 mg/dL (ref 0.6–1.2)
Calcium: 8.8 mg/dL (ref 8.0–10.3)
Glucose, Bld: 96 mg/dL (ref 73–118)
Potassium: 3.7 mEq/L (ref 3.3–4.7)
SODIUM: 141 meq/L (ref 128–145)
TOTAL PROTEIN: 6.4 g/dL (ref 6.4–8.1)

## 2015-09-03 MED ORDER — BORTEZOMIB CHEMO SQ INJECTION 3.5 MG (2.5MG/ML)
1.3000 mg/m2 | Freq: Once | INTRAMUSCULAR | Status: AC
  Administered 2015-09-03: 2.75 mg via SUBCUTANEOUS
  Filled 2015-09-03: qty 2.75

## 2015-09-03 MED ORDER — LENALIDOMIDE 15 MG PO CAPS CTSU E1A11: ORAL_CAPSULE | ORAL | Status: DC

## 2015-09-03 MED ORDER — ONDANSETRON HCL 8 MG PO TABS
ORAL_TABLET | ORAL | Status: AC
  Filled 2015-09-03: qty 1

## 2015-09-03 MED ORDER — ONDANSETRON HCL 8 MG PO TABS
8.0000 mg | ORAL_TABLET | Freq: Once | ORAL | Status: AC
  Administered 2015-09-03: 8 mg via ORAL

## 2015-09-03 NOTE — Progress Notes (Signed)
Hematology and Oncology Follow Up Visit  Mindy Taylor [id number] 03-28-1929 82 y.o. 09/03/2015   Principle Diagnosis:   IgG Lambda myeloma -- 11q- and t(11:14)  JEHOVAH'S WITNESS  Current Therapy:    Patient s/p c#4 of Velcade/Revlimid     Interim History:  Mindy Taylor is back for her follow-up. She seems to be having a tough time with the Revlimid. She says the last week that she takes Revlimid, she just feels horrible.  I think we probably have to decrease her dose of Revlimid. She's on 20 mg a day. I will cut her back to 15 mg a day.  Her monoclonal studies have looked fantastic. Her last monoclonal spike was 1.2 g/dL. This continues to improve. Her IgG level was 2010 mg/dL. Her lambda light chain was 2.8 mg/dL.  She's had no fever. She's had no bleeding. She has had some diarrhea.  She's had no cough. She's had no bony pain.   She is medically clear that she wants quality of life more than anything else. I totally agree with her. At 82 years old, she was spelled to do what she feels like she can do and not have to worry about being sick and being isolated.  Overall, I would say that her performance status is ECOG 1.   Medications:  Current outpatient prescriptions:  .  amLODipine (NORVASC) 5 MG tablet, TAKE 1 TABLET DAILY, Disp: 90 tablet, Rfl: 3 .  Cholecalciferol (VITAMIN D) 1000 UNITS capsule, Take 1 capsule (1,000 Units total) by mouth daily., Disp: 100 capsule, Rfl: 3 .  clindamycin (CLEOCIN) 150 MG capsule, Take 1 capsule (150 mg total) by mouth 4 (four) times daily., Disp: 40 capsule, Rfl: 0 .  famciclovir (FAMVIR) 500 MG tablet, Please take 1 pill 3 times a day for 7 days and then take 1 pill a day indefinitely., Disp: 30 tablet, Rfl: 12 .  fish oil-omega-3 fatty acids 1000 MG capsule, Take 1 g by mouth daily.  , Disp: , Rfl:  .  ibuprofen (ADVIL) 200 MG tablet, Take 1-2 tablets (200-400 mg total) by mouth every 8 (eight) hours as needed for moderate pain., Disp:  30 tablet, Rfl: 0 .  lenalidomide (REVLIMID) 15 MG capsule, Take daily for 21 days. Off for 7 days. Repeat every 28 days. [id number], Disp: 21 capsule, Rfl: 6 .  losartan (COZAAR) 100 MG tablet, Take 1 tablet (100 mg total) by mouth every evening., Disp: 90 tablet, Rfl: 3 .  methocarbamol (ROBAXIN) 500 MG tablet, Take 1 tablet (500 mg total) by mouth every 8 (eight) hours as needed for muscle spasms., Disp: 30 tablet, Rfl: 1 .  metoprolol succinate (TOPROL-XL) 50 MG 24 hr tablet, TAKE 1 TABLET TWICE A DAY, Disp: 180 tablet, Rfl: 3 .  Multiple Vitamin (MULTIVITAMIN) tablet, Take 1 tablet by mouth daily.  , Disp: , Rfl:  .  ondansetron (ZOFRAN) 4 MG tablet, Take 1 tablet (4 mg total) by mouth every 8 (eight) hours as needed for nausea or vomiting., Disp: 30 tablet, Rfl: 3 .  ondansetron (ZOFRAN) 8 MG tablet, Take 1 tablet (8 mg total) by mouth 2 (two) times daily. Start the day after chemo for 2 days. Then take as needed for nausea or vomiting., Disp: 30 tablet, Rfl: 1 .  oxyCODONE-acetaminophen (PERCOCET/ROXICET) 5-325 MG tablet, Take 1 tablet by mouth every 6 (six) hours as needed for severe pain., Disp: 11 tablet, Rfl: 0 .  pravastatin (PRAVACHOL) 40 MG tablet, TAKE 1 TABLET DAILY, Disp: 90  tablet, Rfl: 2 .  prochlorperazine (COMPAZINE) 10 MG tablet, Take 1 tablet (10 mg total) by mouth every 6 (six) hours as needed (Nausea or vomiting)., Disp: 30 tablet, Rfl: 1 .  vitamin C (ASCORBIC ACID) 500 MG tablet, Take 500 mg by mouth daily.  , Disp: , Rfl:   Allergies:  Allergies  Allergen Reactions  . Bee Venom Swelling  . Ceftin [Cefuroxime Axetil]     Felt wierd  . Spider Antivenin [Antivenin Latrodectus Mactans] Other (See Comments)    pain    Past Medical History, Surgical history, Social history, and Family History were reviewed and updated.  Review of Systems: As above  Physical Exam:  height is 5\' 7"  (1.702 m) and weight is 213 lb (96.616 kg). Her oral temperature is 98.4 F (36.9  C). Her blood pressure is 165/71 and her pulse is 85. Her respiration is 16.   Wt Readings from Last 3 Encounters:  09/03/15 213 lb (96.616 kg)  08/10/15 212 lb (96.163 kg)  08/06/15 213 lb (96.616 kg)     Elderly but fairly well-nourished African-American female in no obvious distress. Head and neck exam shows no ocular or oral lesions. She has no palpable cervical or supraclavicular lymph nodes. Lungs are clear bilaterally. Cardiac exam regular rate and rhythm with no murmurs, rubs or bruits. Abdomen is soft. She has good bowel sounds. There is no fluid wave. There is no palpable liver or spleen tip. Back exam shows no tenderness over the spine, ribs or hips. She has no kyphosis. Skin exam shows the vesicular lesions in the left T2 dermatome. Neurological exam shows no focal neurological deficits.  Lab Results  Component Value Date   WBC 4.4 09/03/2015   HGB 9.7* 09/03/2015   HCT 28.9* 09/03/2015   MCV 91 09/03/2015   PLT 246 09/03/2015     Chemistry      Component Value Date/Time   NA 141 09/03/2015 0825   NA 138 07/09/2015 1139   NA 139 05/07/2015 1628   K 3.7 09/03/2015 0825   K 3.6 07/09/2015 1139   K 3.7 05/07/2015 1628   CL 110* 09/03/2015 0825   CL 104 05/07/2015 1628   CO2 25 09/03/2015 0825   CO2 25 07/09/2015 1139   CO2 30 05/07/2015 1628   BUN 13 09/03/2015 0825   BUN 14.9 07/09/2015 1139   BUN 11 05/07/2015 1628   CREATININE 0.9 09/03/2015 0825   CREATININE 0.8 07/09/2015 1139   CREATININE 0.70 05/07/2015 1628      Component Value Date/Time   CALCIUM 8.8 09/03/2015 0825   CALCIUM 8.8 07/09/2015 1139   CALCIUM 9.5 05/07/2015 1628   CALCIUM 9.5 10/02/2011 1601   ALKPHOS 59 09/03/2015 0825   ALKPHOS 68 07/09/2015 1139   ALKPHOS 72 05/07/2015 1628   AST 20 09/03/2015 0825   AST 19 07/09/2015 1139   AST 15 05/07/2015 1628   ALT 19 09/03/2015 0825   ALT 19 07/09/2015 1139   ALT 12 05/07/2015 1628   BILITOT 0.80 09/03/2015 0825   BILITOT 0.81 07/09/2015  1139   BILITOT 0.6 05/07/2015 1628         Impression and Plan: Mindy Taylor is a 82 year old African-American female. She certainly looks younger. She has IgG lambda myeloma. She has 2 chromosomal abnormalities by FISH.  I will continue her on treatment.Again, we will decrease the dose of Revlimid down to 15 mg a day.  We will plan to see her back in another month.  I spent about 30 minutes with she and her sister. It is always nice talking with her.  Again, she is a JEHOVAH'S WITNESS !!   [name], MD 4/10/20179:09 AM

## 2015-09-03 NOTE — Patient Instructions (Signed)
Bortezomib injection What is this medicine? BORTEZOMIB (bor TEZ oh mib) is a medicine that targets proteins in cancer cells and stops the cancer cells from growing. It is used to treat multiple myeloma and mantle-cell lymphoma. This medicine may be used for other purposes; ask your health care provider or pharmacist if you have questions. What should I tell my health care provider before I take this medicine? They need to know if you have any of these conditions: -diabetes -heart disease -irregular heartbeat -liver disease -on hemodialysis -low blood counts, like low white blood cells, platelets, or hemoglobin -peripheral neuropathy -taking medicine for blood pressure -an unusual or allergic reaction to bortezomib, mannitol, boron, other medicines, foods, dyes, or preservatives -pregnant or trying to get pregnant -breast-feeding How should I use this medicine? This medicine is for injection into a vein or for injection under the skin. It is given by a health care professional in a hospital or clinic setting. Talk to your pediatrician regarding the use of this medicine in children. Special care may be needed. Overdosage: If you think you have taken too much of this medicine contact a poison control center or emergency room at once. NOTE: This medicine is only for you. Do not share this medicine with others. What if I miss a dose? It is important not to miss your dose. Call your doctor or health care professional if you are unable to keep an appointment. What may interact with this medicine? This medicine may interact with the following medications: -ketoconazole -rifampin -ritonavir -St. John's Wort This list may not describe all possible interactions. Give your health care provider a list of all the medicines, herbs, non-prescription drugs, or dietary supplements you use. Also tell them if you smoke, drink alcohol, or use illegal drugs. Some items may interact with your medicine. What  should I watch for while using this medicine? Visit your doctor for checks on your progress. This drug may make you feel generally unwell. This is not uncommon, as chemotherapy can affect healthy cells as well as cancer cells. Report any side effects. Continue your course of treatment even though you feel ill unless your doctor tells you to stop. You may get drowsy or dizzy. Do not drive, use machinery, or do anything that needs mental alertness until you know how this medicine affects you. Do not stand or sit up quickly, especially if you are an older patient. This reduces the risk of dizzy or fainting spells. In some cases, you may be given additional medicines to help with side effects. Follow all directions for their use. Call your doctor or health care professional for advice if you get a fever, chills or sore throat, or other symptoms of a cold or flu. Do not treat yourself. This drug decreases your body's ability to fight infections. Try to avoid being around people who are sick. This medicine may increase your risk to bruise or bleed. Call your doctor or health care professional if you notice any unusual bleeding. You may need blood work done while you are taking this medicine. In some patients, this medicine may cause a serious brain infection that may cause death. If you have any problems seeing, thinking, speaking, walking, or standing, tell your doctor right away. If you cannot reach your doctor, urgently seek other source of medical care. Do not become pregnant while taking this medicine. Women should inform their doctor if they wish to become pregnant or think they might be pregnant. There is a potential for serious  side effects to an unborn child. Talk to your health care professional or pharmacist for more information. Do not breast-feed an infant while taking this medicine. Check with your doctor or health care professional if you get an attack of severe diarrhea, nausea and vomiting, or if  you sweat a lot. The loss of too much body fluid can make it dangerous for you to take this medicine. What side effects may I notice from receiving this medicine? Side effects that you should report to your doctor or health care professional as soon as possible: -allergic reactions like skin rash, itching or hives, swelling of the face, lips, or tongue -breathing problems -changes in hearing -changes in vision -fast, irregular heartbeat -feeling faint or lightheaded, falls -pain, tingling, numbness in the hands or feet -right upper belly pain -seizures -swelling of the ankles, feet, hands -unusual bleeding or bruising -unusually weak or tired -vomiting -yellowing of the eyes or skin Side effects that usually do not require medical attention (report to your doctor or health care professional if they continue or are bothersome): -changes in emotions or moods -constipation -diarrhea -loss of appetite -headache -irritation at site where injected -nausea This list may not describe all possible side effects. Call your doctor for medical advice about side effects. You may report side effects to FDA at 1-800-FDA-1088. Where should I keep my medicine? This drug is given in a hospital or clinic and will not be stored at home. NOTE: This sheet is a summary. It may not cover all possible information. If you have questions about this medicine, talk to your doctor, pharmacist, or health care provider.    2016, Elsevier/Gold Standard. (2014-07-11 14:47:04)

## 2015-09-04 LAB — KAPPA/LAMBDA LIGHT CHAINS
IG KAPPA FREE LIGHT CHAIN: 34.96 mg/L — AB (ref 3.30–19.40)
Ig Lambda Free Light Chain: 20.05 mg/L (ref 5.71–26.30)
Kappa/Lambda FluidC Ratio: 1.74 — ABNORMAL HIGH (ref 0.26–1.65)

## 2015-09-04 LAB — IGG, IGA, IGM
IGA/IMMUNOGLOBULIN A, SERUM: 56 mg/dL — AB (ref 64–422)
IgG, Qn, Serum: 1584 mg/dL (ref 700–1600)
IgM, Qn, Serum: 33 mg/dL (ref 26–217)

## 2015-09-05 ENCOUNTER — Other Ambulatory Visit: Payer: Self-pay | Admitting: *Deleted

## 2015-09-05 DIAGNOSIS — C9000 Multiple myeloma not having achieved remission: Secondary | ICD-10-CM

## 2015-09-05 LAB — PROTEIN ELECTROPHORESIS, SERUM
A/G RATIO SPE: 1 (ref 0.7–1.7)
ALBUMIN: 3.1 g/dL (ref 2.9–4.4)
ALPHA 1: 0.2 g/dL (ref 0.0–0.4)
Alpha 2: 0.7 g/dL (ref 0.4–1.0)
Beta: 0.9 g/dL (ref 0.7–1.3)
GAMMA GLOBULIN: 1.3 g/dL (ref 0.4–1.8)
Globulin, Total: 3.1 g/dL (ref 2.2–3.9)
M-Spike, %: 1 g/dL — ABNORMAL HIGH
TOTAL PROTEIN: 6.2 g/dL (ref 6.0–8.5)

## 2015-09-05 MED ORDER — LENALIDOMIDE 15 MG PO CAPS CTSU E1A11: ORAL_CAPSULE | ORAL | Status: DC

## 2015-09-10 ENCOUNTER — Other Ambulatory Visit (HOSPITAL_BASED_OUTPATIENT_CLINIC_OR_DEPARTMENT_OTHER): Payer: Medicare Other

## 2015-09-10 ENCOUNTER — Ambulatory Visit (HOSPITAL_BASED_OUTPATIENT_CLINIC_OR_DEPARTMENT_OTHER): Payer: Medicare Other

## 2015-09-10 VITALS — BP 160/72 | HR 74 | Temp 98.1°F | Resp 16 | Wt 210.0 lb

## 2015-09-10 DIAGNOSIS — Z5112 Encounter for antineoplastic immunotherapy: Secondary | ICD-10-CM

## 2015-09-10 DIAGNOSIS — C9000 Multiple myeloma not having achieved remission: Secondary | ICD-10-CM | POA: Diagnosis not present

## 2015-09-10 LAB — CBC WITH DIFFERENTIAL (CANCER CENTER ONLY)
BASO#: 0 10*3/uL (ref 0.0–0.2)
BASO%: 0 % (ref 0.0–2.0)
EOS%: 2.1 % (ref 0.0–7.0)
Eosinophils Absolute: 0.1 10*3/uL (ref 0.0–0.5)
HEMATOCRIT: 31.8 % — AB (ref 34.8–46.6)
HEMOGLOBIN: 10.6 g/dL — AB (ref 11.6–15.9)
LYMPH#: 1.4 10*3/uL (ref 0.9–3.3)
LYMPH%: 42.1 % (ref 14.0–48.0)
MCH: 30.2 pg (ref 26.0–34.0)
MCHC: 33.3 g/dL (ref 32.0–36.0)
MCV: 91 fL (ref 81–101)
MONO#: 0.3 10*3/uL (ref 0.1–0.9)
MONO%: 9.4 % (ref 0.0–13.0)
NEUT%: 46.4 % (ref 39.6–80.0)
NEUTROS ABS: 1.6 10*3/uL (ref 1.5–6.5)
Platelets: 142 10*3/uL — ABNORMAL LOW (ref 145–400)
RBC: 3.51 10*6/uL — ABNORMAL LOW (ref 3.70–5.32)
RDW: 15.5 % (ref 11.1–15.7)
WBC: 3.4 10*3/uL — ABNORMAL LOW (ref 3.9–10.0)

## 2015-09-10 MED ORDER — ONDANSETRON HCL 8 MG PO TABS
ORAL_TABLET | ORAL | Status: AC
  Filled 2015-09-10: qty 1

## 2015-09-10 MED ORDER — BORTEZOMIB CHEMO SQ INJECTION 3.5 MG (2.5MG/ML)
1.3000 mg/m2 | Freq: Once | INTRAMUSCULAR | Status: AC
  Administered 2015-09-10: 2.75 mg via SUBCUTANEOUS
  Filled 2015-09-10: qty 2.75

## 2015-09-10 MED ORDER — ONDANSETRON HCL 8 MG PO TABS
8.0000 mg | ORAL_TABLET | Freq: Once | ORAL | Status: AC
  Administered 2015-09-10: 8 mg via ORAL

## 2015-09-10 NOTE — Progress Notes (Signed)
Pt states she still has not received Revlimid. Script has been sent by this office. Pt states she has spoken with [facility] and "they said they are still processing it and they will call me back this week." Roselyn Reef, desk RN & Dr Marin Olp aware. Proceed with Velcade today as scheduled per Dr Ginette Pitman. dph

## 2015-09-10 NOTE — Patient Instructions (Signed)
Bortezomib injection What is this medicine? BORTEZOMIB (bor TEZ oh mib) is a medicine that targets proteins in cancer cells and stops the cancer cells from growing. It is used to treat multiple myeloma and mantle-cell lymphoma. This medicine may be used for other purposes; ask your health care provider or pharmacist if you have questions. What should I tell my health care provider before I take this medicine? They need to know if you have any of these conditions: -diabetes -heart disease -irregular heartbeat -liver disease -on hemodialysis -low blood counts, like low white blood cells, platelets, or hemoglobin -peripheral neuropathy -taking medicine for blood pressure -an unusual or allergic reaction to bortezomib, mannitol, boron, other medicines, foods, dyes, or preservatives -pregnant or trying to get pregnant -breast-feeding How should I use this medicine? This medicine is for injection into a vein or for injection under the skin. It is given by a health care professional in a hospital or clinic setting. Talk to your pediatrician regarding the use of this medicine in children. Special care may be needed. Overdosage: If you think you have taken too much of this medicine contact a poison control center or emergency room at once. NOTE: This medicine is only for you. Do not share this medicine with others. What if I miss a dose? It is important not to miss your dose. Call your doctor or health care professional if you are unable to keep an appointment. What may interact with this medicine? This medicine may interact with the following medications: -ketoconazole -rifampin -ritonavir -St. John's Wort This list may not describe all possible interactions. Give your health care provider a list of all the medicines, herbs, non-prescription drugs, or dietary supplements you use. Also tell them if you smoke, drink alcohol, or use illegal drugs. Some items may interact with your medicine. What  should I watch for while using this medicine? Visit your doctor for checks on your progress. This drug may make you feel generally unwell. This is not uncommon, as chemotherapy can affect healthy cells as well as cancer cells. Report any side effects. Continue your course of treatment even though you feel ill unless your doctor tells you to stop. You may get drowsy or dizzy. Do not drive, use machinery, or do anything that needs mental alertness until you know how this medicine affects you. Do not stand or sit up quickly, especially if you are an older patient. This reduces the risk of dizzy or fainting spells. In some cases, you may be given additional medicines to help with side effects. Follow all directions for their use. Call your doctor or health care professional for advice if you get a fever, chills or sore throat, or other symptoms of a cold or flu. Do not treat yourself. This drug decreases your body's ability to fight infections. Try to avoid being around people who are sick. This medicine may increase your risk to bruise or bleed. Call your doctor or health care professional if you notice any unusual bleeding. You may need blood work done while you are taking this medicine. In some patients, this medicine may cause a serious brain infection that may cause death. If you have any problems seeing, thinking, speaking, walking, or standing, tell your doctor right away. If you cannot reach your doctor, urgently seek other source of medical care. Do not become pregnant while taking this medicine. Women should inform their doctor if they wish to become pregnant or think they might be pregnant. There is a potential for serious  side effects to an unborn child. Talk to your health care professional or pharmacist for more information. Do not breast-feed an infant while taking this medicine. Check with your doctor or health care professional if you get an attack of severe diarrhea, nausea and vomiting, or if  you sweat a lot. The loss of too much body fluid can make it dangerous for you to take this medicine. What side effects may I notice from receiving this medicine? Side effects that you should report to your doctor or health care professional as soon as possible: -allergic reactions like skin rash, itching or hives, swelling of the face, lips, or tongue -breathing problems -changes in hearing -changes in vision -fast, irregular heartbeat -feeling faint or lightheaded, falls -pain, tingling, numbness in the hands or feet -right upper belly pain -seizures -swelling of the ankles, feet, hands -unusual bleeding or bruising -unusually weak or tired -vomiting -yellowing of the eyes or skin Side effects that usually do not require medical attention (report to your doctor or health care professional if they continue or are bothersome): -changes in emotions or moods -constipation -diarrhea -loss of appetite -headache -irritation at site where injected -nausea This list may not describe all possible side effects. Call your doctor for medical advice about side effects. You may report side effects to FDA at 1-800-FDA-1088. Where should I keep my medicine? This drug is given in a hospital or clinic and will not be stored at home. NOTE: This sheet is a summary. It may not cover all possible information. If you have questions about this medicine, talk to your doctor, pharmacist, or health care provider.    2016, Elsevier/Gold Standard. (2014-07-11 14:47:04)

## 2015-09-17 ENCOUNTER — Other Ambulatory Visit (HOSPITAL_BASED_OUTPATIENT_CLINIC_OR_DEPARTMENT_OTHER): Payer: Medicare Other

## 2015-09-17 ENCOUNTER — Ambulatory Visit (HOSPITAL_BASED_OUTPATIENT_CLINIC_OR_DEPARTMENT_OTHER): Payer: Medicare Other

## 2015-09-17 ENCOUNTER — Other Ambulatory Visit: Payer: Self-pay | Admitting: *Deleted

## 2015-09-17 VITALS — BP 177/88 | HR 80 | Temp 97.5°F

## 2015-09-17 DIAGNOSIS — C9000 Multiple myeloma not having achieved remission: Secondary | ICD-10-CM

## 2015-09-17 DIAGNOSIS — Z5112 Encounter for antineoplastic immunotherapy: Secondary | ICD-10-CM

## 2015-09-17 LAB — CBC WITH DIFFERENTIAL (CANCER CENTER ONLY)
BASO#: 0 10*3/uL (ref 0.0–0.2)
BASO%: 0.3 % (ref 0.0–2.0)
EOS ABS: 0.2 10*3/uL (ref 0.0–0.5)
EOS%: 6 % (ref 0.0–7.0)
HCT: 33.4 % — ABNORMAL LOW (ref 34.8–46.6)
HEMOGLOBIN: 11.4 g/dL — AB (ref 11.6–15.9)
LYMPH#: 1.5 10*3/uL (ref 0.9–3.3)
LYMPH%: 39.8 % (ref 14.0–48.0)
MCH: 30.8 pg (ref 26.0–34.0)
MCHC: 34.1 g/dL (ref 32.0–36.0)
MCV: 90 fL (ref 81–101)
MONO#: 0.3 10*3/uL (ref 0.1–0.9)
MONO%: 9.2 % (ref 0.0–13.0)
NEUT#: 1.7 10*3/uL (ref 1.5–6.5)
NEUT%: 44.7 % (ref 39.6–80.0)
PLATELETS: 131 10*3/uL — AB (ref 145–400)
RBC: 3.7 10*6/uL (ref 3.70–5.32)
RDW: 16.2 % — AB (ref 11.1–15.7)
WBC: 3.7 10*3/uL — ABNORMAL LOW (ref 3.9–10.0)

## 2015-09-17 MED ORDER — BORTEZOMIB CHEMO SQ INJECTION 3.5 MG (2.5MG/ML)
1.3000 mg/m2 | Freq: Once | INTRAMUSCULAR | Status: AC
  Administered 2015-09-17: 2.75 mg via SUBCUTANEOUS
  Filled 2015-09-17: qty 1.1

## 2015-09-17 MED ORDER — ONDANSETRON HCL 8 MG PO TABS
8.0000 mg | ORAL_TABLET | Freq: Once | ORAL | Status: AC
  Administered 2015-09-17: 8 mg via ORAL

## 2015-09-17 MED ORDER — ONDANSETRON HCL 8 MG PO TABS
ORAL_TABLET | ORAL | Status: AC
  Filled 2015-09-17: qty 1

## 2015-09-17 NOTE — Patient Instructions (Signed)
[facility] Discharge Instructions for Patients Receiving Chemotherapy  Today you received the following chemotherapy agents Velcade  To help prevent nausea and vomiting after your treatment, we encourage you to take your nausea medication    If you develop nausea and vomiting that is not controlled by your nausea medication, call the clinic.   BELOW ARE SYMPTOMS THAT SHOULD BE REPORTED IMMEDIATELY:  *FEVER GREATER THAN 100.5 F  *CHILLS WITH OR WITHOUT FEVER  NAUSEA AND VOMITING THAT IS NOT CONTROLLED WITH YOUR NAUSEA MEDICATION  *UNUSUAL SHORTNESS OF BREATH  *UNUSUAL BRUISING OR BLEEDING  TENDERNESS IN MOUTH AND THROAT WITH OR WITHOUT PRESENCE OF ULCERS  *URINARY PROBLEMS  *BOWEL PROBLEMS  UNUSUAL RASH Items with * indicate a potential emergency and should be followed up as soon as possible.  Feel free to call the clinic you have any questions or concerns. The clinic phone number is [phone].  Please show the CHEMO ALERT CARD at check-in to the Emergency Department and triage nurse.   

## 2015-09-24 ENCOUNTER — Encounter: Payer: Self-pay | Admitting: Nurse Practitioner

## 2015-09-24 NOTE — Progress Notes (Signed)
Spoke with pt who stated she no longer wished to continue with treatment as "she has to many effects and her vision is changing. Much rather die than to live life wondering what will happen next". Explained to patient to consider not cancelling all appointments and to come in to see Dr. Marin Olp. She verbalized understanding and will keep appointment to come in to see him and discuss future plans.

## 2015-10-01 ENCOUNTER — Other Ambulatory Visit: Payer: Self-pay | Admitting: Nurse Practitioner

## 2015-10-01 DIAGNOSIS — C9000 Multiple myeloma not having achieved remission: Secondary | ICD-10-CM

## 2015-10-01 MED ORDER — LENALIDOMIDE 15 MG PO CAPS CTSU E1A11: ORAL_CAPSULE | ORAL | Status: DC

## 2015-10-06 ENCOUNTER — Other Ambulatory Visit: Payer: Self-pay | Admitting: Internal Medicine

## 2015-10-08 ENCOUNTER — Encounter: Payer: Self-pay | Admitting: Hematology & Oncology

## 2015-10-08 ENCOUNTER — Other Ambulatory Visit (HOSPITAL_BASED_OUTPATIENT_CLINIC_OR_DEPARTMENT_OTHER): Payer: Medicare Other

## 2015-10-08 ENCOUNTER — Ambulatory Visit (HOSPITAL_BASED_OUTPATIENT_CLINIC_OR_DEPARTMENT_OTHER): Payer: Medicare Other | Admitting: Hematology & Oncology

## 2015-10-08 ENCOUNTER — Ambulatory Visit: Payer: Medicare Other

## 2015-10-08 VITALS — BP 121/70 | HR 81 | Temp 98.2°F | Resp 16 | Ht 67.0 in | Wt 203.0 lb

## 2015-10-08 DIAGNOSIS — H538 Other visual disturbances: Secondary | ICD-10-CM

## 2015-10-08 DIAGNOSIS — C9000 Multiple myeloma not having achieved remission: Secondary | ICD-10-CM

## 2015-10-08 DIAGNOSIS — R634 Abnormal weight loss: Secondary | ICD-10-CM

## 2015-10-08 LAB — CMP (CANCER CENTER ONLY)
ALT(SGPT): 16 U/L (ref 10–47)
AST: 21 U/L (ref 11–38)
Albumin: 3.2 g/dL — ABNORMAL LOW (ref 3.3–5.5)
Alkaline Phosphatase: 72 U/L (ref 26–84)
BUN, Bld: 13 mg/dL (ref 7–22)
CHLORIDE: 107 meq/L (ref 98–108)
CO2: 25 meq/L (ref 18–33)
CREATININE: 0.8 mg/dL (ref 0.6–1.2)
Calcium: 8.7 mg/dL (ref 8.0–10.3)
Glucose, Bld: 103 mg/dL (ref 73–118)
POTASSIUM: 3.7 meq/L (ref 3.3–4.7)
SODIUM: 139 meq/L (ref 128–145)
TOTAL PROTEIN: 7.2 g/dL (ref 6.4–8.1)
Total Bilirubin: 1.1 mg/dl (ref 0.20–1.60)

## 2015-10-08 LAB — CBC WITH DIFFERENTIAL (CANCER CENTER ONLY)
BASO#: 0 10*3/uL (ref 0.0–0.2)
BASO%: 0.3 % (ref 0.0–2.0)
EOS ABS: 0.1 10*3/uL (ref 0.0–0.5)
EOS%: 1.5 % (ref 0.0–7.0)
HEMATOCRIT: 33.9 % — AB (ref 34.8–46.6)
HGB: 11.3 g/dL — ABNORMAL LOW (ref 11.6–15.9)
LYMPH#: 1.2 10*3/uL (ref 0.9–3.3)
LYMPH%: 36 % (ref 14.0–48.0)
MCH: 30.7 pg (ref 26.0–34.0)
MCHC: 33.3 g/dL (ref 32.0–36.0)
MCV: 92 fL (ref 81–101)
MONO#: 0.3 10*3/uL (ref 0.1–0.9)
MONO%: 9.2 % (ref 0.0–13.0)
NEUT%: 53 % (ref 39.6–80.0)
NEUTROS ABS: 1.8 10*3/uL (ref 1.5–6.5)
Platelets: 200 10*3/uL (ref 145–400)
RBC: 3.68 10*6/uL — AB (ref 3.70–5.32)
RDW: 16.2 % — ABNORMAL HIGH (ref 11.1–15.7)
WBC: 3.4 10*3/uL — AB (ref 3.9–10.0)

## 2015-10-08 LAB — TSH: TSH: 1.074 m(IU)/L (ref 0.308–3.960)

## 2015-10-08 NOTE — Progress Notes (Signed)
No treatment today per Dr Ennever 

## 2015-10-08 NOTE — Progress Notes (Signed)
Hematology and Oncology Follow Up Visit  Merridith Dolloff Ishman [id number] 03/19/1929 82 y.o. 10/08/2015   Principle Diagnosis:   IgG Lambda myeloma -- 11q- and t(11:14)  JEHOVAH'S WITNESS  Current Therapy:    Patient s/p c#5 of Velcade/Revlimid     Interim History:  Ms. Weyl is back for her follow-up. Unfortunately, she does not want anymore treatment. She is still loses some weight. I have told her that I just do not think that the myeloma is a cause for her weight loss. I don't think the medication that we have her on for the myeloma is a cause for the weight loss.  She also complained of some blurred vision. I suppose that the Revlimid might be a contributor as this has been shown to cause cataracts in about 10% of patients.  I told her that the myeloma is responding nicely. Back in April, her M spike was down to 1.0 g/dL. Her IgG level was 1584 mg/dL.[name] Her lambda light chain was down to 2 mg/dL.  She fears that at her age, that she wants quality of life she does not 1 lose her vision.  She is taking aspirin. I told her that she probably needs to go see her ophthalmologist.  As far as weight loss is concerned, I will check her thyroid. Hyper thyroidism is a possibility which I would not think we'll be related to her treatments.  Again, she values quality of life over everything else. I cannot RU with this as she is 82 years old and we want to give her good quality of life and good function. I think that the results that we had with the chemotherapy have been very very good.  She's had no diarrhea. There's not been any bleeding. She's had no leg pain area and she's had no joint problems.  Overall, her performance status is ECOG 2.   Medications:  Current outpatient prescriptions:  .  amLODipine (NORVASC) 5 MG tablet, TAKE 1 TABLET DAILY, Disp: 90 tablet, Rfl: 3 .  Cholecalciferol (VITAMIN D) 1000 UNITS capsule, Take 1 capsule (1,000 Units total) by mouth daily., Disp: 100 capsule,  Rfl: 3 .  clindamycin (CLEOCIN) 150 MG capsule, Take 1 capsule (150 mg total) by mouth 4 (four) times daily., Disp: 40 capsule, Rfl: 0 .  fish oil-omega-3 fatty acids 1000 MG capsule, Take 1 g by mouth daily.  , Disp: , Rfl:  .  ibuprofen (ADVIL) 200 MG tablet, Take 1-2 tablets (200-400 mg total) by mouth every 8 (eight) hours as needed for moderate pain., Disp: 30 tablet, Rfl: 0 .  losartan (COZAAR) 100 MG tablet, Take 1 tablet (100 mg total) by mouth every evening., Disp: 90 tablet, Rfl: 3 .  methocarbamol (ROBAXIN) 500 MG tablet, Take 1 tablet (500 mg total) by mouth every 8 (eight) hours as needed for muscle spasms., Disp: 30 tablet, Rfl: 1 .  metoprolol succinate (TOPROL-XL) 50 MG 24 hr tablet, TAKE 1 TABLET TWICE A DAY, Disp: 180 tablet, Rfl: 3 .  Multiple Vitamin (MULTIVITAMIN) tablet, Take 1 tablet by mouth daily.  , Disp: , Rfl:  .  ondansetron (ZOFRAN) 4 MG tablet, Take 1 tablet (4 mg total) by mouth every 8 (eight) hours as needed for nausea or vomiting., Disp: 30 tablet, Rfl: 3 .  oxyCODONE-acetaminophen (PERCOCET/ROXICET) 5-325 MG tablet, Take 1 tablet by mouth every 6 (six) hours as needed for severe pain., Disp: 11 tablet, Rfl: 0 .  pravastatin (PRAVACHOL) 40 MG tablet, TAKE 1 TABLET DAILY, Disp:  90 tablet, Rfl: 2 .  vitamin C (ASCORBIC ACID) 500 MG tablet, Take 500 mg by mouth daily.  , Disp: , Rfl:   Allergies:  Allergies  Allergen Reactions  . Bee Venom Swelling  . Ceftin [Cefuroxime Axetil]     Felt wierd  . Spider Antivenin [Antivenin Latrodectus Mactans] Other (See Comments)    pain    Past Medical History, Surgical history, Social history, and Family History were reviewed and updated.  Review of Systems: As above  Physical Exam:  height is 5\' 7"  (1.702 m) and weight is 203 lb (92.08 kg). Her oral temperature is 98.2 F (36.8 C). Her blood pressure is 121/70 and her pulse is 81. Her respiration is 16.   Wt Readings from Last 3 Encounters:  10/08/15 203 lb (92.08  kg)  09/10/15 210 lb (95.255 kg)  09/03/15 213 lb (96.616 kg)     Elderly but fairly well-nourished African-American female in no obvious distress. Head and neck exam shows no ocular or oral lesions.I do not see any obvious cataracts. Her pupils react appropriately. She has no palpable cervical or supraclavicular lymph nodes. Lungs are clear bilaterally. Cardiac exam regular rate and rhythm with no murmurs, rubs or bruits. Abdomen is soft. She has good bowel sounds. There is no fluid wave. There is no palpable liver or spleen tip. Back exam shows no tenderness over the spine, ribs or hips. She has no kyphosis. Skin exam shows no rashes, ecchymoses or petechia. Neurological exam shows no focal neurological deficits.  Lab Results  Component Value Date   WBC 3.4* 10/08/2015   HGB 11.3* 10/08/2015   HCT 33.9* 10/08/2015   MCV 92 10/08/2015   PLT 200 10/08/2015     Chemistry      Component Value Date/Time   NA 139 10/08/2015 0956   NA 138 07/09/2015 1139   NA 139 05/07/2015 1628   K 3.7 10/08/2015 0956   K 3.6 07/09/2015 1139   K 3.7 05/07/2015 1628   CL 107 10/08/2015 0956   CL 104 05/07/2015 1628   CO2 25 10/08/2015 0956   CO2 25 07/09/2015 1139   CO2 30 05/07/2015 1628   BUN 13 10/08/2015 0956   BUN 14.9 07/09/2015 1139   BUN 11 05/07/2015 1628   CREATININE 0.8 10/08/2015 0956   CREATININE 0.8 07/09/2015 1139   CREATININE 0.70 05/07/2015 1628      Component Value Date/Time   CALCIUM 8.7 10/08/2015 0956   CALCIUM 8.8 07/09/2015 1139   CALCIUM 9.5 05/07/2015 1628   CALCIUM 9.5 10/02/2011 1601   ALKPHOS 72 10/08/2015 0956   ALKPHOS 68 07/09/2015 1139   ALKPHOS 72 05/07/2015 1628   AST 21 10/08/2015 0956   AST 19 07/09/2015 1139   AST 15 05/07/2015 1628   ALT 16 10/08/2015 0956   ALT 19 07/09/2015 1139   ALT 12 05/07/2015 1628   BILITOT 1.10 10/08/2015 0956   BILITOT 0.81 07/09/2015 1139   BILITOT 0.6 05/07/2015 1628         Impression and Plan: Ms. Brownfield is a  82 year old African-American female. She certainly looks younger. She has IgG lambda myeloma. She has 2 chromosomal abnormalities by FISH.  I spent about 35 minutes with she and her sister. I try to figure out what is going on with her. Again, I would be surprised if these issues are related to the myeloma or to the chemotherapy. We will find out as we are stopping that her treatments.  I will  plan to see her back in 2 months. I will call her about the thyroid studies.  We will have to monitor her fairly closely. Her myeloma, at some point, will become more of an issue. If it went does, we will have to talk with Ms. Ramsey or about what the possible options for intervention might be.  Again, she is a JEHOVAH'S WITNESS !!   [name], MD 5/15/201711:05 AM

## 2015-10-09 ENCOUNTER — Telehealth: Payer: Self-pay | Admitting: *Deleted

## 2015-10-09 LAB — IGG, IGA, IGM
IgA, Qn, Serum: 57 mg/dL — ABNORMAL LOW (ref 64–422)
IgM, Qn, Serum: 31 mg/dL (ref 26–217)

## 2015-10-09 LAB — KAPPA/LAMBDA LIGHT CHAINS
IG KAPPA FREE LIGHT CHAIN: 30.08 mg/L — AB (ref 3.30–19.40)
Ig Lambda Free Light Chain: 18.94 mg/L (ref 5.71–26.30)
Kappa/Lambda FluidC Ratio: 1.59 (ref 0.26–1.65)

## 2015-10-09 LAB — T4: Thyroxine (T4): 4.1 ug/dL — ABNORMAL LOW (ref 4.5–12.0)

## 2015-10-09 NOTE — Telephone Encounter (Addendum)
Patient aware of results  ----- Message from [name], MD sent at 10/09/2015 10:21 AM EDT ----- Call - your thyroid levels are ok!!! No problem with your thyroid gland!!  pete

## 2015-10-11 LAB — PROTEIN ELECTROPHORESIS, SERUM, WITH REFLEX
A/G Ratio: 0.9 (ref 0.7–1.7)
ALBUMIN: 3.3 g/dL (ref 2.9–4.4)
ALPHA 2: 0.8 g/dL (ref 0.4–1.0)
Alpha 1: 0.2 g/dL (ref 0.0–0.4)
Beta: 1 g/dL (ref 0.7–1.3)
GAMMA GLOBULIN: 1.5 g/dL (ref 0.4–1.8)
Globulin, Total: 3.5 g/dL (ref 2.2–3.9)
Interpretation(See Below): 0
M-Spike, %: 1.1 g/dL — ABNORMAL HIGH
Total Protein: 6.8 g/dL (ref 6.0–8.5)

## 2015-10-12 ENCOUNTER — Encounter: Payer: Self-pay | Admitting: Internal Medicine

## 2015-10-12 ENCOUNTER — Encounter: Payer: Self-pay | Admitting: *Deleted

## 2015-10-12 ENCOUNTER — Ambulatory Visit (INDEPENDENT_AMBULATORY_CARE_PROVIDER_SITE_OTHER): Payer: Medicare Other | Admitting: Internal Medicine

## 2015-10-12 ENCOUNTER — Other Ambulatory Visit (INDEPENDENT_AMBULATORY_CARE_PROVIDER_SITE_OTHER): Payer: Medicare Other

## 2015-10-12 VITALS — BP 150/80 | HR 90 | Wt 200.0 lb

## 2015-10-12 DIAGNOSIS — I63519 Cerebral infarction due to unspecified occlusion or stenosis of unspecified middle cerebral artery: Secondary | ICD-10-CM | POA: Diagnosis not present

## 2015-10-12 DIAGNOSIS — M2550 Pain in unspecified joint: Secondary | ICD-10-CM | POA: Diagnosis not present

## 2015-10-12 DIAGNOSIS — C9001 Multiple myeloma in remission: Secondary | ICD-10-CM | POA: Diagnosis not present

## 2015-10-12 DIAGNOSIS — R634 Abnormal weight loss: Secondary | ICD-10-CM

## 2015-10-12 LAB — CBC WITH DIFFERENTIAL/PLATELET
BASOS PCT: 0.5 % (ref 0.0–3.0)
Basophils Absolute: 0 10*3/uL (ref 0.0–0.1)
EOS PCT: 1.3 % (ref 0.0–5.0)
Eosinophils Absolute: 0.1 10*3/uL (ref 0.0–0.7)
HCT: 34.3 % — ABNORMAL LOW (ref 36.0–46.0)
Hemoglobin: 11.6 g/dL — ABNORMAL LOW (ref 12.0–15.0)
LYMPHS ABS: 1.8 10*3/uL (ref 0.7–4.0)
Lymphocytes Relative: 40.9 % (ref 12.0–46.0)
MCHC: 33.6 g/dL (ref 30.0–36.0)
MCV: 90.9 fl (ref 78.0–100.0)
MONO ABS: 0.4 10*3/uL (ref 0.1–1.0)
MONOS PCT: 8.7 % (ref 3.0–12.0)
NEUTROS ABS: 2.2 10*3/uL (ref 1.4–7.7)
NEUTROS PCT: 48.6 % (ref 43.0–77.0)
Platelets: 211 10*3/uL (ref 150.0–400.0)
RBC: 3.78 Mil/uL — AB (ref 3.87–5.11)
RDW: 17.9 % — AB (ref 11.5–15.5)
WBC: 4.4 10*3/uL (ref 4.0–10.5)

## 2015-10-12 LAB — RHEUMATOID FACTOR: Rhuematoid fact SerPl-aCnc: <10 IU/mL (ref <=14)

## 2015-10-12 LAB — SEDIMENTATION RATE: SED RATE: 33 mm/h — AB (ref 0–30)

## 2015-10-12 MED ORDER — PREDNISONE 10 MG PO TABS: ORAL_TABLET | ORAL | Status: DC

## 2015-10-12 MED ORDER — HYDROCODONE-ACETAMINOPHEN 5-325 MG PO TABS: 0.5000 | ORAL_TABLET | Freq: Four times a day (QID) | ORAL | Status: DC | PRN

## 2015-10-12 NOTE — Progress Notes (Signed)
Pre visit review using our clinic review tool, if applicable. No additional management support is needed unless otherwise documented below in the visit note. 

## 2015-10-12 NOTE — Assessment & Plan Note (Signed)
Labs  Poss inflammatory arthritis sx's w/wt loss - ?PMR Empiric prednisone Norco prn  Potential benefits of a long term steroid  use as well as potential risks  and complications were explained to the patient and were aknowledged.

## 2015-10-12 NOTE — Assessment & Plan Note (Signed)
Labs  Poss inflammatory arthritis sx's Empiric prednisone Norco prn  Potential benefits of a long term steroid  use as well as potential risks  and complications were explained to the patient and were aknowledged.

## 2015-10-12 NOTE — Assessment & Plan Note (Signed)
5/17 pt stopped Rx due to ?reasons. I suggested to resume Rx

## 2015-10-12 NOTE — Progress Notes (Signed)
Subjective:  Patient ID: Mindy Taylor, female    DOB: 1928-06-30  Age: 82 y.o. MRN: [id number]  CC: No chief complaint on file.   HPI Mindy Taylor presents for MM, HTN f/u. C/o wt loss (was 238 lbs). C/o arthralgia - bad Comes w/a niece Mindy Taylor. Diarrhea stopped on 4/26.17. Pt stopped Revlimid  Outpatient Prescriptions Prior to Visit  Medication Sig Dispense Refill  . amLODipine (NORVASC) 5 MG tablet TAKE 1 TABLET DAILY 90 tablet 3  . Cholecalciferol (VITAMIN D) 1000 UNITS capsule Take 1 capsule (1,000 Units total) by mouth daily. 100 capsule 3  . clindamycin (CLEOCIN) 150 MG capsule Take 1 capsule (150 mg total) by mouth 4 (four) times daily. 40 capsule 0  . fish oil-omega-3 fatty acids 1000 MG capsule Take 1 g by mouth daily.      [name] ibuprofen (ADVIL) 200 MG tablet Take 1-2 tablets (200-400 mg total) by mouth every 8 (eight) hours as needed for moderate pain. 30 tablet 0  . losartan (COZAAR) 100 MG tablet Take 1 tablet (100 mg total) by mouth every evening. 90 tablet 3  . methocarbamol (ROBAXIN) 500 MG tablet Take 1 tablet (500 mg total) by mouth every 8 (eight) hours as needed for muscle spasms. 30 tablet 1  . metoprolol succinate (TOPROL-XL) 50 MG 24 hr tablet TAKE 1 TABLET TWICE A DAY 180 tablet 2  . Multiple Vitamin (MULTIVITAMIN) tablet Take 1 tablet by mouth daily.      . ondansetron (ZOFRAN) 4 MG tablet Take 1 tablet (4 mg total) by mouth every 8 (eight) hours as needed for nausea or vomiting. 30 tablet 3  . oxyCODONE-acetaminophen (PERCOCET/ROXICET) 5-325 MG tablet Take 1 tablet by mouth every 6 (six) hours as needed for severe pain. 11 tablet 0  . pravastatin (PRAVACHOL) 40 MG tablet TAKE 1 TABLET DAILY 90 tablet 2  . vitamin C (ASCORBIC ACID) 500 MG tablet Take 500 mg by mouth daily.       No facility-administered medications prior to visit.    ROS Review of Systems  Constitutional: Positive for unexpected weight change. Negative for chills, activity change, appetite  change and fatigue.  HENT: Negative for congestion, mouth sores and sinus pressure.   Eyes: Negative for visual disturbance.  Respiratory: Negative for cough and chest tightness.   Gastrointestinal: Negative for nausea and abdominal pain.  Genitourinary: Negative for frequency, difficulty urinating and vaginal pain.  Musculoskeletal: Positive for arthralgias. Negative for back pain and gait problem.  Skin: Negative for pallor and rash.  Neurological: Negative for dizziness, tremors, weakness, numbness and headaches.  Psychiatric/Behavioral: Negative for suicidal ideas, confusion and sleep disturbance.    Objective:  BP 150/80 mmHg  Pulse 90  Wt 200 lb (90.719 kg)  SpO2 98%  BP Readings from Last 3 Encounters:  10/12/15 150/80  10/08/15 121/70  09/17/15 177/88    Wt Readings from Last 3 Encounters:  10/12/15 200 lb (90.719 kg)  10/08/15 203 lb (92.08 kg)  09/10/15 210 lb (95.255 kg)    Physical Exam  Constitutional: She appears well-developed. No distress.  HENT:  Head: Normocephalic.  Right Ear: External ear normal.  Left Ear: External ear normal.  Nose: Nose normal.  Mouth/Throat: Oropharynx is clear and moist.  Eyes: Conjunctivae are normal. Pupils are equal, round, and reactive to light. Right eye exhibits no discharge. Left eye exhibits no discharge.  Neck: Normal range of motion. Neck supple. No JVD present. No tracheal deviation present. No thyromegaly present.  Cardiovascular:  Normal rate, regular rhythm and normal heart sounds.   Pulmonary/Chest: No stridor. No respiratory distress. She has no wheezes.  Abdominal: Soft. Bowel sounds are normal. She exhibits no distension and no mass. There is no tenderness. There is no rebound and no guarding.  Musculoskeletal: She exhibits no edema or tenderness.  Lymphadenopathy:    She has no cervical adenopathy.  Neurological: She displays normal reflexes. No cranial nerve deficit. She exhibits normal muscle tone.  Coordination normal.  Skin: No rash noted. No erythema.  Psychiatric: She has a normal mood and affect. Her behavior is normal. Judgment and thought content normal.  joints are sensitive Temples NT  Lab Results  Component Value Date   WBC 3.4* 10/08/2015   HGB 11.3* 10/08/2015   HCT 33.9* 10/08/2015   PLT 200 10/08/2015   GLUCOSE 103 10/08/2015   CHOL 129 09/06/2012   TRIG 63.0 09/06/2012   HDL 33.90* 09/06/2012   LDLCALC 83 09/06/2012   ALT 16 10/08/2015   AST 21 10/08/2015   NA 139 10/08/2015   K 3.7 10/08/2015   CL 107 10/08/2015   CREATININE 0.8 10/08/2015   BUN 13 10/08/2015   CO2 25 10/08/2015   TSH 1.074 10/08/2015   INR 1.04 03/16/2015   HGBA1C 5.7 08/11/2011    Dg Wrist Complete Right  07/06/2015  CLINICAL DATA:  Right hand and wrist pain and swelling for 4 days. EXAM: RIGHT WRIST - COMPLETE 3+ VIEW COMPARISON:  03/14/2015 FINDINGS: Chronically flattened distal head of the first metacarpal. Spurring at the interphalangeal joint of the thumb. Mild generalized bony demineralization. Articular space narrowing between the scaphoid and trapezium. There is a suggestion of soft tissue swelling along the wrist, especially along the volar wrist, but also involving the subcutaneous tissues. IMPRESSION: 1. Soft tissue swelling in the wrist with subcutaneous edema and possible volar soft tissue swelling anterior to the distal radius. Etiology uncertain. Clinically warranted MRI could be utilized for further workup. 2. Degenerative findings in the lateral carpus and in the thumb. Chronic flattening of the head of the first metacarpal. Electronically Signed   By: Van Clines M.D.   On: 07/06/2015 13:01   Dg Hand Complete Right  07/06/2015  CLINICAL DATA:  Pain and swelling for 4 days EXAM: RIGHT HAND - COMPLETE 3+ VIEW COMPARISON:  March 14, 2015 FINDINGS: Frontal, oblique, and lateral views were obtained. There is a degree of generalized soft tissue swelling. There is no acute  fracture or dislocation. There is osteoarthritic change in all PIP and DIP joints as well as in the first MCP and IP joint regions. Osteoarthritic changes also noted in the scaphotrapezial and first carpal -metacarpal joints. Bones are osteoporotic. No erosive change. IMPRESSION: Multifocal osteoarthritic change. Bones appear osteoporotic. There is generalized soft tissue swelling. No acute fracture or dislocation. No erosive change. Electronically Signed   By: Lowella Grip III M.D.   On: 07/06/2015 13:00    Assessment & Plan:   There are no diagnoses linked to this encounter. I am having Mindy Taylor maintain her Vitamin D, multivitamin, vitamin C, fish oil-omega-3 fatty acids, losartan, methocarbamol, amLODipine, pravastatin, ibuprofen, ondansetron, oxyCODONE-acetaminophen, clindamycin, and metoprolol succinate.  No orders of the defined types were placed in this encounter.     Follow-up: No Follow-up on file.  Walker Kehr, MD

## 2015-10-14 ENCOUNTER — Other Ambulatory Visit: Payer: Self-pay | Admitting: Internal Medicine

## 2015-10-15 ENCOUNTER — Ambulatory Visit: Payer: Medicare Other

## 2015-10-15 ENCOUNTER — Other Ambulatory Visit: Payer: Medicare Other

## 2015-10-15 LAB — CYCLIC CITRUL PEPTIDE ANTIBODY, IGG: Cyclic Citrullin Peptide Ab: <16 Units

## 2015-10-15 LAB — ANA: ANA: NEGATIVE

## 2015-10-23 ENCOUNTER — Ambulatory Visit: Payer: Medicare Other

## 2015-10-23 ENCOUNTER — Other Ambulatory Visit: Payer: Medicare Other

## 2015-10-28 ENCOUNTER — Other Ambulatory Visit: Payer: Self-pay | Admitting: Internal Medicine

## 2015-11-09 ENCOUNTER — Ambulatory Visit: Payer: Medicare Other | Admitting: Internal Medicine

## 2015-11-19 ENCOUNTER — Encounter: Payer: Self-pay | Admitting: Internal Medicine

## 2015-11-19 ENCOUNTER — Ambulatory Visit (INDEPENDENT_AMBULATORY_CARE_PROVIDER_SITE_OTHER): Payer: Medicare Other | Admitting: Internal Medicine

## 2015-11-19 VITALS — BP 158/60 | HR 80 | Ht 67.0 in | Wt 207.0 lb

## 2015-11-19 DIAGNOSIS — M2550 Pain in unspecified joint: Secondary | ICD-10-CM | POA: Diagnosis not present

## 2015-11-19 DIAGNOSIS — Z0000 Encounter for general adult medical examination without abnormal findings: Secondary | ICD-10-CM | POA: Insufficient documentation

## 2015-11-19 DIAGNOSIS — I63519 Cerebral infarction due to unspecified occlusion or stenosis of unspecified middle cerebral artery: Secondary | ICD-10-CM | POA: Diagnosis not present

## 2015-11-19 DIAGNOSIS — C9001 Multiple myeloma in remission: Secondary | ICD-10-CM | POA: Diagnosis not present

## 2015-11-19 NOTE — Assessment & Plan Note (Signed)
F/u w/Dr Ennever 

## 2015-11-19 NOTE — Progress Notes (Signed)
Subjective:   Mindy Taylor is a 82 y.o. female who presents for Medicare Annual (Subsequent) preventive examination.  Review of Systems:   HRA assessment completed during this visit with Mindy Taylor.   The Patient was informed that the wellness visit is to identify their future health risk and educate and initiate measures that can reduce risk for increased disease through the lifespan.    NO ROS; Medicare Wellness Visit Last OV today with Dr. Alain Marion  Labs completed and discussed with the patient  Health is good;  Post chemo and capsules for multiple myeloma; but eyes started to have issues. Started seeing black floaters; states there were changes in her pupil. All is better now that she is off chemo from April 26th;  Went to eye doctor but does not know date but has not seen since she came off chemo as eye issues have resolved. Was educated there is help for vision loss but states she could not accept vision loss.  States the thought of vision loss is especially debilitating and now refuses further treatment. Basically affirms her positive nature and states she feels good and does not feel there is any cancer; has no symptoms including in bone pain. To see oncologist in July for fup and states she does plan to go to this visit.    Lifestyle review and risk: HTN; obesity;   Psychosocial: lives alone with dog; Lives apt; on 1st floor; very good neighbors; [place] children;  Very close to her nieces'   Tobacco: no/ states very limited smoking and quit a very long time ago.  How many drinks do you have per week? Has not drank since chemo; drinks rarely very rarely   Medications: no issues getting medications; [employer]; mailed q 3 months  BMI: 32.4 (lost 30lbs)   Diet;  was 238 and now 207 today; 75f 7in/ loves with weight loss as she is much more mobile Cream of wheat in the am; oatmeal Lunch; sandwiches; cornbread;  Cooks most day Supper; had vegetables every  day   Teeth or Denture issues? Upper and lower plate not fitting like they were.  Not going to change as she is adjusting and nutrition is adequate  Exercise;   Walks with dog; [place] dog park [place];  Fall hx; fell last week; tripped over cane; bruised on right inner arm;  Fear of falling? no Given education on "Fall Prevention in the Home" for more safety tips the patient can apply as appropriate.  Long term goal is to "age in place" or undecided/ moved in apt until Dec and does not plan to move; left home of 52 years;   Safety features reviewed for safe community; firearms if in the home (no); smoke alarms; sun protection when outside; wears a hat ; driving difficulties or accidents no   Stressors (1-5) very positive person;   Mental Health:  Any emotional problems? Anxious, depressed, irritable, sad or blue? no Denies feeling depressed or hopeless; voices pleasure in daily life no How many social activities have you been engaged in within the last 2 weeks?  no She is in a strong community of believers;   Pain: never had pain; took chemo from [place]; very sick but stopped due to fear of eye issues;   Cognitive; states memory is better than younger Takes people to the doctor's office  Manages checkbook, medications; no failures of task Ad8 score reviewed for issues;  Issues making decisions; no  Less interest in hobbies / activities" no  Repeats questions, stories; family complaining: NO  Trouble using ordinary gadgets; microwave; computer: no  Forgets the month or year: no  Mismanaging finances: no  Missing apt: no but does write them down  apDaily problems with thinking of memory NO Ad8 score is 0   Any dizziness when standing up? No   Mobilization and Functional losses from last year to this year? NO mobility has improved since she lost the weight;   Sleep pattern changes; never sleeps all night long; sleeps 6 hours   Urinary  or fecal incontinence reviewed: no    Counseling Health Maintenance Gaps:  Colonoscopy; had one and aged out now; declined last exam  EKG: 02/2014 Mammogram: 05/2006;  Dexa/ deferred due to all CTs and fup; will defer x 1 year and gave her information on bone density if she would like to pursue.   Hearing: '4000hz'$  both ears   Ophthalmology exam; going once a year; Dr. Rachael Fee  Hasn't even changed her glasses; floaters stopped;  Thinks Pupil is normal in appearance Apt within the last 6 months per the patient Call to the office and her last apt Sept 3, 2014;  Seen eye doctor closer to her home more recently.   Immunizations Due: Tetanus; for 6 months post chemo; Deferred to (October) Stated her immune system is still low; will defer zostavax / shingles episode in November will defer until Nov 2018 Had shingles around Nov; prior to starting chemo   Advanced Directive addressed; Completed; niece is AD  Individual Goal: to stay healthy and help others   Health Recommendations and Referrals To fup with Dr. Alain Marion as directed  Barriers to Success Feels she is "back to normal"  Defers eye exam at this time, stated her eyes are normal; Not sure when she is due to go again;    Current Care Team reviewed and updated Dr. Marin Olp, [place]    Education provided and lifestyle risk discussed   All Health Maintenance Gaps Reviewed for closure         Objective:     Vitals: BP 158/60 mmHg  Pulse 80  Ht '5\' 7"'$  (1.702 m)  Wt 207 lb (93.895 kg)  BMI 32.41 kg/m2  SpO2 97%  Body mass index is 32.41 kg/(m^2).   Tobacco History  Smoking status  . Former Smoker  Smokeless tobacco  . Not on file    Comment: Not sure when; many, many years      Counseling given: Not Answered   Past Medical History  Diagnosis Date  . ANXIETY 03/05/2007  . ATAXIA 12/26/2009  . BEE STING 03/05/2007  . BRONCHITIS, ACUTE 06/28/2010  . CANDIDIASIS, VAGINAL 09/08/2008  . CEREBROVASCULAR ACCIDENT, HX  OF 01/04/2010  . CERUMEN IMPACTION 06/05/2008  . DIZZINESS 12/26/2009  . Epistaxis 06/05/2008  . Headache(784.0) 06/05/2008  . HYPERLIPIDEMIA 12/08/2006  . HYPERTENSION 12/08/2006  . 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 NECK PAIN 06/05/2008  . OBESITY 09/07/2009  . ONYCHOMYCOSIS 03/09/2009  . OSTEOARTHRITIS 12/08/2006  . PARESTHESIA 12/26/2009  . TOBACCO USE, QUIT 03/09/2009  . Multiple myeloma ([place]) 04/03/2015   Past Surgical History  Procedure Laterality Date  . Breast surgery    . Abdominal hysterectomy     Family History  Problem Relation Age of Onset  . Hypertension Other   . Hypertension Mother   . Hypertension Father    History  Sexual Activity  . Sexual Activity: No  Outpatient Encounter Prescriptions as of 11/19/2015  Medication Sig  . amLODipine (NORVASC) 5 MG tablet TAKE 1 TABLET DAILY  . Cholecalciferol (VITAMIN D) 1000 UNITS capsule Take 1 capsule (1,000 Units total) by mouth daily.  . fish oil-omega-3 fatty acids 1000 MG capsule Take 1 g by mouth daily.    [name] HYDROcodone-acetaminophen (NORCO/VICODIN) 5-325 MG tablet Take 0.5-1 tablets by mouth every 6 (six) hours as needed for severe pain.  [name] ibuprofen (ADVIL) 200 MG tablet Take 1-2 tablets (200-400 mg total) by mouth every 8 (eight) hours as needed for moderate pain.  [name] losartan (COZAAR) 100 MG tablet TAKE 1 TABLET EVERY EVENING  . methocarbamol (ROBAXIN) 500 MG tablet Take 1 tablet (500 mg total) by mouth every 8 (eight) hours as needed for muscle spasms.  . metoprolol succinate (TOPROL-XL) 50 MG 24 hr tablet TAKE 1 TABLET TWICE A DAY  . Multiple Vitamin (MULTIVITAMIN) tablet Take 1 tablet by mouth daily.    . ondansetron (ZOFRAN) 4 MG tablet Take 1 tablet (4 mg total) by mouth every 8 (eight) hours as needed for nausea or vomiting.  . pravastatin (PRAVACHOL) 40 MG tablet TAKE 1 TABLET DAILY  . predniSONE (DELTASONE) 10 MG tablet 1 po qd pc  . vitamin C (ASCORBIC ACID) 500 MG tablet Take 500 mg by mouth daily.     No  facility-administered encounter medications on file as of 11/19/2015.    Activities of Daily Living In your present state of health, do you have any difficulty performing the following activities: 03/16/2015  Hearing? N  Vision? N  Difficulty concentrating or making decisions? N  Walking or climbing stairs? N  Dressing or bathing? N    Patient Care Team: Cassandria Anger, MD as PCP - General Berle Mull, MD as Attending Physician (Family Medicine)    Assessment:     Exercise Activities and Dietary recommendations    Goals    . patient     States she wants to continue to feel good; Loves helping people and will continue to do this      Fall Risk Fall Risk  10/08/2015 09/03/2015 08/13/2015 08/06/2015 07/09/2015  Falls in the past year? Yes Yes Yes Yes Yes  Number falls in past yr: '1 1 1 1 1  '$ Injury with Fall? - No No No No  Follow up - - - - -   Depression Screen PHQ 2/9 Scores 11/22/2014  PHQ - 2 Score 0     Cognitive Testing No flowsheet data found.  Ad8 score is 0   Immunization History  Administered Date(s) Administered  . Influenza Split 02/18/2011, 03/19/2012  . Influenza Whole 03/09/2009, 02/18/2010  . Influenza,inj,Quad PF,36+ Mos 03/04/2013, 02/01/2014, 02/01/2015  . Pneumococcal Conjugate-13 08/10/2015  . Pneumococcal Polysaccharide-23 12/31/2004   Screening Tests Health Maintenance  Topic Date Due  . TETANUS/TDAP  05/29/1947  . ZOSTAVAX  05/29/1990  . DEXA SCAN  05/30/1995  . INFLUENZA VACCINE  12/25/2015  . PNA vac Low Risk Adult  Completed      Plan:   Will defer Tdap until 6 months post chemo and re-evaluate Deferred Zostavax x 1 year from Shingles episode in Nov 2016 Will defer dexa at present  During the course of the visit the patient was educated and counseled about the following appropriate screening and preventive services:   Vaccines to include Pneumoccal, Influenza, Hepatitis B, Td, Zostavax, HCV  Electrocardiogram   03/11/2014  Cardiovascular Disease / HTN  Colorectal cancer screening/ aged out  Bone  density screening/ deferred no record available  Diabetes screening/ neg  Glaucoma screening/ ? No eye report; encouraged eye visit post chemo but declined stated her eyes are fine.   Mammography/PAP; no  Nutrition counseling weight loss; feels much better and hopes to keep weight off   States she loves to cook; eats well / BMI 32  Patient Instructions (the written plan) was given to the patient.   Wynetta Fines, RN  11/19/2015

## 2015-11-19 NOTE — Patient Instructions (Addendum)
Here for medicare wellness/physical  Diet: heart healthy  Physical activity: not sedentary  Depression/mood screen: negative  Hearing: intact to whispered voice  Visual acuity: grossly normal, performs annual eye exam  ADLs: capable  Fall risk: low to none  Home safety: good  Cognitive evaluation: intact to orientation, naming, recall and repetition  EOL planning: adv directives, full code/ I agree  I have personally reviewed and have noted  1. The patient's medical, surgical and social history  2. Their use of alcohol, tobacco or illicit drugs  3. Their current medications and supplements  4. The patient's functional ability including ADL's, fall risks, home safety risks and hearing or visual impairment.  5. Diet and physical activities  6. Evidence for depression or mood disorders 7. The roster of all physicians providing medical care to patient - is listed in the Snapshot section of the chart and reviewed today.    Mindy Taylor , Thank you for taking time to come for your Medicare Wellness Visit. I appreciate your ongoing commitment to your health goals. Please review the following plan we discussed and let me know if I can assist you in the future.   These are the goals we discussed: Goals    . patient     States she wants to continue to feel good; Loves helping people and will continue to do this       This is a list of the screening recommended for you and due dates:  Health Maintenance  Topic Date Due  . Tetanus Vaccine  05/29/1947  . Shingles Vaccine  05/29/1990  . DEXA scan (bone density measurement)  05/30/1995  . Flu Shot  12/25/2015  . Pneumonia vaccines  Completed      Today patient counseled on age appropriate routine health concerns for screening and prevention, each reviewed and up to date or declined. Immunizations reviewed and up to date or declined. Labs ordered and reviewed. Risk factors for depression reviewed and negative. Hearing function and visual  acuity are intact. ADLs screened and addressed as needed. Functional ability and level of safety reviewed and appropriate. Education, counseling and referrals performed based on assessed risks today. Patient provided with a copy of personalized plan for preventive services.      Bone Densitometry Bone densitometry is an imaging test that uses a special X-ray to measure the amount of calcium and other minerals in your bones (bone density). This test is also known as a bone mineral density test or dual-energy X-ray absorptiometry (DXA). The test can measure bone density at your hip and your spine. It is similar to having a regular X-ray. You may have this test to:  Diagnose a condition that causes weak or thin bones (osteoporosis).  Predict your risk of a broken bone (fracture).  Determine how well osteoporosis treatment is working. LET [facility] CARE PROVIDER KNOW ABOUT:  Any allergies you have.  All medicines you are taking, including vitamins, herbs, eye drops, creams, and over-the-counter medicines.  Previous problems you or members of your family have had with the use of anesthetics.  Any blood disorders you have.  Previous surgeries you have had.  Medical conditions you have.  Possibility of pregnancy.  Any other medical test you had within the previous 14 days that used contrast material. RISKS AND COMPLICATIONS Generally, this is a safe procedure. However, problems can occur and may include the following:  This test exposes you to a very small amount of radiation.  The risks of radiation  exposure may be greater to unborn children. BEFORE THE PROCEDURE  Do not take any calcium supplements for 24 hours before having the test. You can otherwise eat and drink what you usually do.  Take off all metal jewelry, eyeglasses, dental appliances, and any other metal objects. PROCEDURE  You may lie on an exam table. There will be an X-ray generator below you and an imaging  device above you.  Other devices, such as boxes or braces, may be used to position your body properly for the scan.  You will need to lie still while the machine slowly scans your body.  The images will show up on a computer monitor. AFTER THE PROCEDURE You may need more testing at a later time.   This information is not intended to replace advice given to you by your health care provider. Make sure you discuss any questions you have with your health care provider.   Document Released: 06/03/2004 Document Revised: 06/02/2014 Document Reviewed: 10/20/2013 Elsevier Interactive Patient Education 2016 [employer]  Fat and Cholesterol Restricted Diet Getting too much fat and cholesterol in your diet may cause health problems. Following this diet helps keep your fat and cholesterol at normal levels. This can keep you from getting sick. WHAT TYPES OF FAT SHOULD I CHOOSE?  Choose monosaturated and polyunsaturated fats. These are found in foods such as olive oil, canola oil, flaxseeds, walnuts, almonds, and seeds.  Eat more omega-3 fats. Good choices include salmon, mackerel, sardines, tuna, flaxseed oil, and ground flaxseeds.  Limit saturated fats. These are in animal products such as meats, butter, and cream. They can also be in plant products such as palm oil, palm kernel oil, and coconut oil.   Avoid foods with partially hydrogenated oils in them. These contain trans fats. Examples of foods that have trans fats are stick margarine, some tub margarines, cookies, crackers, and other baked goods. WHAT GENERAL GUIDELINES DO I NEED TO FOLLOW?   Check food labels. Look for the words "trans fat" and "saturated fat."  When preparing a meal:  Fill half of your plate with vegetables and green salads.  Fill one fourth of your plate with whole grains. Look for the word "whole" as the first word in the ingredient list.  Fill one fourth of your plate with lean protein foods.  Limit fruit to two  servings a day. Choose fruit instead of juice.  Eat more foods with soluble fiber. Examples of foods with this type of fiber are apples, broccoli, carrots, beans, peas, and barley. Try to get 20-30 g (grams) of fiber per day.  Eat more home-cooked foods. Eat less at restaurants and buffets.  Limit or avoid alcohol.  Limit foods high in starch and sugar.  Limit fried foods.  Cook foods without frying them. Baking, boiling, grilling, and broiling are all great options.  Lose weight if you are overweight. Losing even a small amount of weight can help your overall health. It can also help prevent diseases such as diabetes and heart disease. WHAT FOODS CAN I EAT? Grains Whole grains, such as whole wheat or whole grain breads, crackers, cereals, and pasta. Unsweetened oatmeal, bulgur, barley, quinoa, or brown rice. Corn or whole wheat flour tortillas. Vegetables Fresh or frozen vegetables (raw, steamed, roasted, or grilled). Green salads. Fruits All fresh, canned (in natural juice), or frozen fruits. Meat and Other Protein Products Ground beef (85% or leaner), grass-fed beef, or beef trimmed of fat. Skinless chicken or [place]. Ground chicken or [place]. Pork  trimmed of fat. All fish and seafood. Eggs. Dried beans, peas, or lentils. Unsalted nuts or seeds. Unsalted canned or dry beans. Dairy Low-fat dairy products, such as skim or 1% milk, 2% or reduced-fat cheeses, low-fat ricotta or cottage cheese, or plain low-fat yogurt. Fats and Oils Tub margarines without trans fats. Light or reduced-fat mayonnaise and salad dressings. Avocado. Olive, canola, sesame, or safflower oils. Natural peanut or almond butter (choose ones without added sugar and oil). The items listed above may not be a complete list of recommended foods or beverages. Contact your dietitian for more options. WHAT FOODS ARE NOT RECOMMENDED? Grains White bread. White pasta. White rice. Cornbread. Bagels, pastries, and croissants.  Crackers that contain trans fat. Vegetables White potatoes. Corn. Creamed or fried vegetables. Vegetables in a cheese sauce. Fruits Dried fruits. Canned fruit in light or heavy syrup. Fruit juice. Meat and Other Protein Products Fatty cuts of meat. Ribs, chicken wings, bacon, sausage, bologna, salami, chitterlings, fatback, hot dogs, bratwurst, and packaged luncheon meats. Liver and organ meats. Dairy Whole or 2% milk, cream, half-and-half, and cream cheese. Whole milk cheeses. Whole-fat or sweetened yogurt. Full-fat cheeses. Nondairy creamers and whipped toppings. Processed cheese, cheese spreads, or cheese curds. Sweets and Desserts Corn syrup, sugars, honey, and molasses. Candy. Jam and jelly. Syrup. Sweetened cereals. Cookies, pies, cakes, donuts, muffins, and ice cream. Fats and Oils Butter, stick margarine, lard, shortening, ghee, or bacon fat. Coconut, palm kernel, or palm oils. Beverages Alcohol. Sweetened drinks (such as sodas, lemonade, and fruit drinks or punches). The items listed above may not be a complete list of foods and beverages to avoid. Contact your dietitian for more information.   This information is not intended to replace advice given to you by your health care provider. Make sure you discuss any questions you have with your health care provider.   Document Released: 11/11/2011 Document Revised: 06/02/2014 Document Reviewed: 08/11/2013 Elsevier Interactive Patient Education 2016 [place] in the Home  Falls can cause injuries. They can happen to people of all ages. There are many things you can do to make your home safe and to help prevent falls.  WHAT CAN I DO ON THE OUTSIDE OF MY HOME?  Regularly fix the edges of walkways and driveways and fix any cracks.  Remove anything that might make you trip as you walk through a door, such as a raised step or threshold.  Trim any bushes or trees on the path to your home.  Use bright outdoor  lighting.  Clear any walking paths of anything that might make someone trip, such as rocks or tools.  Regularly check to see if handrails are loose or broken. Make sure that both sides of any steps have handrails.  Any raised decks and porches should have guardrails on the edges.  Have any leaves, snow, or ice cleared regularly.  Use sand or salt on walking paths during winter.  Clean up any spills in your garage right away. This includes oil or grease spills. WHAT CAN I DO IN THE BATHROOM?   Use night lights.  Install grab bars by the toilet and in the tub and shower. Do not use towel bars as grab bars.  Use non-skid mats or decals in the tub or shower.  If you need to sit down in the shower, use a plastic, non-slip stool.  Keep the floor dry. Clean up any water that spills on the floor as soon as it happens.  Remove soap buildup  in the tub or shower regularly.  Attach bath mats securely with double-sided non-slip rug tape.  Do not have throw rugs and other things on the floor that can make you trip. WHAT CAN I DO IN THE BEDROOM?  Use night lights.  Make sure that you have a light by your bed that is easy to reach.  Do not use any sheets or blankets that are too big for your bed. They should not hang down onto the floor.  Have a firm chair that has side arms. You can use this for support while you get dressed.  Do not have throw rugs and other things on the floor that can make you trip. WHAT CAN I DO IN THE KITCHEN?  Clean up any spills right away.  Avoid walking on wet floors.  Keep items that you use a lot in easy-to-reach places.  If you need to reach something above you, use a strong step stool that has a grab bar.  Keep electrical cords out of the way.  Do not use floor polish or wax that makes floors slippery. If you must use wax, use non-skid floor wax.  Do not have throw rugs and other things on the floor that can make you trip. WHAT CAN I DO WITH MY  STAIRS?  Do not leave any items on the stairs.  Make sure that there are handrails on both sides of the stairs and use them. Fix handrails that are broken or loose. Make sure that handrails are as long as the stairways.  Check any carpeting to make sure that it is firmly attached to the stairs. Fix any carpet that is loose or worn.  Avoid having throw rugs at the top or bottom of the stairs. If you do have throw rugs, attach them to the floor with carpet tape.  Make sure that you have a light switch at the top of the stairs and the bottom of the stairs. If you do not have them, ask someone to add them for you. WHAT ELSE CAN I DO TO HELP PREVENT FALLS?  Wear shoes that:  Do not have high heels.  Have rubber bottoms.  Are comfortable and fit you well.  Are closed at the toe. Do not wear sandals.  If you use a stepladder:  Make sure that it is fully opened. Do not climb a closed stepladder.  Make sure that both sides of the stepladder are locked into place.  Ask someone to hold it for you, if possible.  Clearly mark and make sure that you can see:  Any grab bars or handrails.  First and last steps.  Where the edge of each step is.  Use tools that help you move around (mobility aids) if they are needed. These include:  Canes.  Walkers.  Scooters.  Crutches.  Turn on the lights when you go into a dark area. Replace any light bulbs as soon as they burn out.  Set up your furniture so you have a clear path. Avoid moving your furniture around.  If any of your floors are uneven, fix them.  If there are any pets around you, be aware of where they are.  Review your medicines with your doctor. Some medicines can make you feel dizzy. This can increase your chance of falling. Ask your doctor what other things that you can do to help prevent falls.   This information is not intended to replace advice given to you by your health care provider.  Make sure you discuss any  questions you have with your health care provider.   Document Released: 03/08/2009 Document Revised: 09/26/2014 Document Reviewed: 06/16/2014 Elsevier Interactive Patient Education 2016 [place] Maintenance, Female Adopting a healthy lifestyle and getting preventive care can go a long way to promote health and wellness. Talk with your health care provider about what schedule of regular examinations is right for you. This is a good chance for you to check in with your provider about disease prevention and staying healthy. In between checkups, there are plenty of things you can do on your own. Experts have done a lot of research about which lifestyle changes and preventive measures are most likely to keep you healthy. Ask your health care provider for more information. WEIGHT AND DIET  Eat a healthy diet  Be sure to include plenty of vegetables, fruits, low-fat dairy products, and lean protein.  Do not eat a lot of foods high in solid fats, added sugars, or salt.  Get regular exercise. This is one of the most important things you can do for your health.  Most adults should exercise for at least 150 minutes each week. The exercise should increase your heart rate and make you sweat (moderate-intensity exercise).  Most adults should also do strengthening exercises at least twice a week. This is in addition to the moderate-intensity exercise.  Maintain a healthy weight  Body mass index (BMI) is a measurement that can be used to identify possible weight problems. It estimates body fat based on height and weight. Your health care provider can help determine your BMI and help you achieve or maintain a healthy weight.  For females 63 years of age and older:   A BMI below 18.5 is considered underweight.  A BMI of 18.5 to 24.9 is normal.  A BMI of 25 to 29.9 is considered overweight.  A BMI of 30 and above is considered obese.  Watch levels of cholesterol and blood lipids  You  should start having your blood tested for lipids and cholesterol at 80 years of age, then have this test every 5 years.  You may need to have your cholesterol levels checked more often if:  Your lipid or cholesterol levels are high.  You are older than 80 years of age.  You are at high risk for heart disease.  CANCER SCREENING   Lung Cancer  Lung cancer screening is recommended for adults 42-73 years old who are at high risk for lung cancer because of a history of smoking.  A yearly low-dose CT scan of the lungs is recommended for people who:  Currently smoke.  Have quit within the past 15 years.  Have at least a 30-pack-year history of smoking. A pack year is smoking an average of one pack of cigarettes a day for 1 year.  Yearly screening should continue until it has been 15 years since you quit.  Yearly screening should stop if you develop a health problem that would prevent you from having lung cancer treatment.  Breast Cancer  Practice breast self-awareness. This means understanding how your breasts normally appear and feel.  It also means doing regular breast self-exams. Let your health care provider know about any changes, no matter how small.  If you are in your 20s or 30s, you should have a clinical breast exam (CBE) by a health care provider every 1-3 years as part of a regular health exam.  If you are 58 or older, have a CBE  every year. Also consider having a breast X-ray (mammogram) every year.  If you have a family history of breast cancer, talk to your health care provider about genetic screening.  If you are at high risk for breast cancer, talk to your health care provider about having an MRI and a mammogram every year.  Breast cancer gene (BRCA) assessment is recommended for women who have family members with BRCA-related cancers. BRCA-related cancers include:  Breast.  Ovarian.  Tubal.  Peritoneal cancers.  Results of the assessment will determine  the need for genetic counseling and BRCA1 and BRCA2 testing. Cervical Cancer Your health care provider may recommend that you be screened regularly for cancer of the pelvic organs (ovaries, uterus, and vagina). This screening involves a pelvic examination, including checking for microscopic changes to the surface of your cervix (Pap test). You may be encouraged to have this screening done every 3 years, beginning at age 89.  For women ages 12-65, health care providers may recommend pelvic exams and Pap testing every 3 years, or they may recommend the Pap and pelvic exam, combined with testing for human papilloma virus (HPV), every 5 years. Some types of HPV increase your risk of cervical cancer. Testing for HPV may also be done on women of any age with unclear Pap test results.  Other health care providers may not recommend any screening for nonpregnant women who are considered low risk for pelvic cancer and who do not have symptoms. Ask your health care provider if a screening pelvic exam is right for you.  If you have had past treatment for cervical cancer or a condition that could lead to cancer, you need Pap tests and screening for cancer for at least 20 years after your treatment. If Pap tests have been discontinued, your risk factors (such as having a new sexual partner) need to be reassessed to determine if screening should resume. Some women have medical problems that increase the chance of getting cervical cancer. In these cases, your health care provider may recommend more frequent screening and Pap tests. Colorectal Cancer  This type of cancer can be detected and often prevented.  Routine colorectal cancer screening usually begins at 80 years of age and continues through 80 years of age.  Your health care provider may recommend screening at an earlier age if you have risk factors for colon cancer.  Your health care provider may also recommend using home test kits to check for hidden blood  in the stool.  A small camera at the end of a tube can be used to examine your colon directly (sigmoidoscopy or colonoscopy). This is done to check for the earliest forms of colorectal cancer.  Routine screening usually begins at age 63.  Direct examination of the colon should be repeated every 5-10 years through 80 years of age. However, you may need to be screened more often if early forms of precancerous polyps or small growths are found. Skin Cancer  Check your skin from head to toe regularly.  Tell your health care provider about any new moles or changes in moles, especially if there is a change in a mole's shape or color.  Also tell your health care provider if you have a mole that is larger than the size of a pencil eraser.  Always use sunscreen. Apply sunscreen liberally and repeatedly throughout the day.  Protect yourself by wearing long sleeves, pants, a wide-brimmed hat, and sunglasses whenever you are outside. HEART DISEASE, DIABETES, AND HIGH BLOOD PRESSURE  High blood pressure causes heart disease and increases the risk of stroke. High blood pressure is more likely to develop in:  People who have blood pressure in the high end of the normal range (130-139/85-89 mm Hg).  People who are overweight or obese.  People who are African American.  If you are 48-2 years of age, have your blood pressure checked every 3-5 years. If you are 12 years of age or older, have your blood pressure checked every year. You should have your blood pressure measured twice--once when you are at a hospital or clinic, and once when you are not at a hospital or clinic. Record the average of the two measurements. To check your blood pressure when you are not at a hospital or clinic, you can use:  An automated blood pressure machine at a pharmacy.  A home blood pressure monitor.  If you are between 94 years and 73 years old, ask your health care provider if you should take aspirin to prevent  strokes.  Have regular diabetes screenings. This involves taking a blood sample to check your fasting blood sugar level.  If you are at a normal weight and have a low risk for diabetes, have this test once every three years after 80 years of age.  If you are overweight and have a high risk for diabetes, consider being tested at a younger age or more often. PREVENTING INFECTION  Hepatitis B  If you have a higher risk for hepatitis B, you should be screened for this virus. You are considered at high risk for hepatitis B if:  You were born in a country where hepatitis B is common. Ask your health care provider which countries are considered high risk.  Your parents were born in a high-risk country, and you have not been immunized against hepatitis B (hepatitis B vaccine).  You have HIV or AIDS.  You use needles to inject street drugs.  You live with someone who has hepatitis B.  You have had sex with someone who has hepatitis B.  You get hemodialysis treatment.  You take certain medicines for conditions, including cancer, organ transplantation, and autoimmune conditions. Hepatitis C  Blood testing is recommended for:  Everyone born from [birth year] through 1963.  Anyone with known risk factors for hepatitis C. Sexually transmitted infections (STIs)  You should be screened for sexually transmitted infections (STIs) including gonorrhea and chlamydia if:  You are sexually active and are younger than 80 years of age.  You are older than 80 years of age and your health care provider tells you that you are at risk for this type of infection.  Your sexual activity has changed since you were last screened and you are at an increased risk for chlamydia or gonorrhea. Ask your health care provider if you are at risk.  If you do not have HIV, but are at risk, it may be recommended that you take a prescription medicine daily to prevent HIV infection. This is called pre-exposure prophylaxis  (PrEP). You are considered at risk if:  You are sexually active and do not regularly use condoms or know the HIV status of your partner(s).  You take drugs by injection.  You are sexually active with a partner who has HIV. Talk with your health care provider about whether you are at high risk of being infected with HIV. If you choose to begin PrEP, you should first be tested for HIV. You should then be tested every 3 months for as  long as you are taking PrEP.  PREGNANCY   If you are premenopausal and you may become pregnant, ask your health care provider about preconception counseling.  If you may become pregnant, take 400 to 800 micrograms (mcg) of folic acid every day.  If you want to prevent pregnancy, talk to your health care provider about birth control (contraception). OSTEOPOROSIS AND MENOPAUSE   Osteoporosis is a disease in which the bones lose minerals and strength with aging. This can result in serious bone fractures. Your risk for osteoporosis can be identified using a bone density scan.  If you are 81 years of age or older, or if you are at risk for osteoporosis and fractures, ask your health care provider if you should be screened.  Ask your health care provider whether you should take a calcium or vitamin D supplement to lower your risk for osteoporosis.  Menopause may have certain physical symptoms and risks.  Hormone replacement therapy may reduce some of these symptoms and risks. Talk to your health care provider about whether hormone replacement therapy is right for you.  HOME CARE INSTRUCTIONS   Schedule regular health, dental, and eye exams.  Stay current with your immunizations.   Do not use any tobacco products including cigarettes, chewing tobacco, or electronic cigarettes.  If you are pregnant, do not drink alcohol.  If you are breastfeeding, limit how much and how often you drink alcohol.  Limit alcohol intake to no more than 1 drink per day for  nonpregnant women. One drink equals 12 ounces of beer, 5 ounces of wine, or 1 ounces of hard liquor.  Do not use street drugs.  Do not share needles.  Ask your health care provider for help if you need support or information about quitting drugs.  Tell your health care provider if you often feel depressed.  Tell your health care provider if you have ever been abused or do not feel safe at home.   This information is not intended to replace advice given to you by your health care provider. Make sure you discuss any questions you have with your health care provider.   Document Released: 11/25/2010 Document Revised: 06/02/2014 Document Reviewed: 04/13/2013 Elsevier Interactive Patient Education [employer].

## 2015-11-19 NOTE — Assessment & Plan Note (Signed)

## 2015-11-19 NOTE — Progress Notes (Signed)
Subjective:  Patient ID: Mindy Taylor, female    DOB: 03-27-1929  Age: 82 y.o. MRN: [id number]  CC: No chief complaint on file.   HPI Mindy Taylor presents for arthralgias, MM, HTN f/u  Outpatient Prescriptions Prior to Visit  Medication Sig Dispense Refill  . amLODipine (NORVASC) 5 MG tablet TAKE 1 TABLET DAILY 90 tablet 3  . Cholecalciferol (VITAMIN D) 1000 UNITS capsule Take 1 capsule (1,000 Units total) by mouth daily. 100 capsule 3  . fish oil-omega-3 fatty acids 1000 MG capsule Take 1 g by mouth daily.      [name] HYDROcodone-acetaminophen (NORCO/VICODIN) 5-325 MG tablet Take 0.5-1 tablets by mouth every 6 (six) hours as needed for severe pain. 90 tablet 0  . ibuprofen (ADVIL) 200 MG tablet Take 1-2 tablets (200-400 mg total) by mouth every 8 (eight) hours as needed for moderate pain. 30 tablet 0  . losartan (COZAAR) 100 MG tablet TAKE 1 TABLET EVERY EVENING 90 tablet 2  . methocarbamol (ROBAXIN) 500 MG tablet Take 1 tablet (500 mg total) by mouth every 8 (eight) hours as needed for muscle spasms. 30 tablet 1  . metoprolol succinate (TOPROL-XL) 50 MG 24 hr tablet TAKE 1 TABLET TWICE A DAY 180 tablet 2  . Multiple Vitamin (MULTIVITAMIN) tablet Take 1 tablet by mouth daily.      . ondansetron (ZOFRAN) 4 MG tablet Take 1 tablet (4 mg total) by mouth every 8 (eight) hours as needed for nausea or vomiting. 30 tablet 3  . pravastatin (PRAVACHOL) 40 MG tablet TAKE 1 TABLET DAILY 90 tablet 2  . predniSONE (DELTASONE) 10 MG tablet 1 po qd pc 30 tablet 3  . vitamin C (ASCORBIC ACID) 500 MG tablet Take 500 mg by mouth daily.       No facility-administered medications prior to visit.    ROS Review of Systems  Constitutional: Positive for fatigue. Negative for chills, activity change, appetite change and unexpected weight change.  HENT: Negative for congestion, mouth sores and sinus pressure.   Eyes: Negative for visual disturbance.  Respiratory: Negative for cough and chest tightness.     Gastrointestinal: Negative for nausea and abdominal pain.  Genitourinary: Negative for frequency, difficulty urinating and vaginal pain.  Musculoskeletal: Positive for arthralgias. Negative for back pain and gait problem.  Skin: Negative for pallor and rash.  Neurological: Negative for dizziness, tremors, weakness, numbness and headaches.  Psychiatric/Behavioral: Negative for confusion and sleep disturbance.    Objective:  BP 158/60 mmHg  Pulse 80  Wt 207 lb (93.895 kg)  SpO2 97%  BP Readings from Last 3 Encounters:  11/19/15 158/60  10/12/15 150/80  10/08/15 121/70    Wt Readings from Last 3 Encounters:  11/19/15 207 lb (93.895 kg)  10/12/15 200 lb (90.719 kg)  10/08/15 203 lb (92.08 kg)    Physical Exam  Constitutional: She appears well-developed. No distress.  HENT:  Head: Normocephalic.  Right Ear: External ear normal.  Left Ear: External ear normal.  Nose: Nose normal.  Mouth/Throat: Oropharynx is clear and moist.  Eyes: Conjunctivae are normal. Pupils are equal, round, and reactive to light. Right eye exhibits no discharge. Left eye exhibits no discharge.  Neck: Normal range of motion. Neck supple. No JVD present. No tracheal deviation present. No thyromegaly present.  Cardiovascular: Normal rate, regular rhythm and normal heart sounds.   Pulmonary/Chest: No stridor. No respiratory distress. She has no wheezes.  Abdominal: Soft. Bowel sounds are normal. She exhibits no distension and no mass.  There is no tenderness. There is no rebound and no guarding.  Musculoskeletal: She exhibits tenderness. She exhibits no edema.  Lymphadenopathy:    She has no cervical adenopathy.  Neurological: She displays normal reflexes. No cranial nerve deficit. She exhibits normal muscle tone. Coordination normal.  Skin: No rash noted. No erythema.  Psychiatric: She has a normal mood and affect. Her behavior is normal. Judgment and thought content normal.  Obese  Lab Results   Component Value Date   WBC 4.4 10/12/2015   HGB 11.6* 10/12/2015   HCT 34.3* 10/12/2015   PLT 211.0 10/12/2015   GLUCOSE 103 10/08/2015   CHOL 129 09/06/2012   TRIG 63.0 09/06/2012   HDL 33.90* 09/06/2012   LDLCALC 83 09/06/2012   ALT 16 10/08/2015   AST 21 10/08/2015   NA 139 10/08/2015   K 3.7 10/08/2015   CL 107 10/08/2015   CREATININE 0.8 10/08/2015   BUN 13 10/08/2015   CO2 25 10/08/2015   TSH 1.074 10/08/2015   INR 1.04 03/16/2015   HGBA1C 5.7 08/11/2011    Dg Wrist Complete Right  07/06/2015  CLINICAL DATA:  Right hand and wrist pain and swelling for 4 days. EXAM: RIGHT WRIST - COMPLETE 3+ VIEW COMPARISON:  03/14/2015 FINDINGS: Chronically flattened distal head of the first metacarpal. Spurring at the interphalangeal joint of the thumb. Mild generalized bony demineralization. Articular space narrowing between the scaphoid and trapezium. There is a suggestion of soft tissue swelling along the wrist, especially along the volar wrist, but also involving the subcutaneous tissues. IMPRESSION: 1. Soft tissue swelling in the wrist with subcutaneous edema and possible volar soft tissue swelling anterior to the distal radius. Etiology uncertain. Clinically warranted MRI could be utilized for further workup. 2. Degenerative findings in the lateral carpus and in the thumb. Chronic flattening of the head of the first metacarpal. Electronically Signed   By: Van Clines M.D.   On: 07/06/2015 13:01   Dg Hand Complete Right  07/06/2015  CLINICAL DATA:  Pain and swelling for 4 days EXAM: RIGHT HAND - COMPLETE 3+ VIEW COMPARISON:  March 14, 2015 FINDINGS: Frontal, oblique, and lateral views were obtained. There is a degree of generalized soft tissue swelling. There is no acute fracture or dislocation. There is osteoarthritic change in all PIP and DIP joints as well as in the first MCP and IP joint regions. Osteoarthritic changes also noted in the scaphotrapezial and first carpal  -metacarpal joints. Bones are osteoporotic. No erosive change. IMPRESSION: Multifocal osteoarthritic change. Bones appear osteoporotic. There is generalized soft tissue swelling. No acute fracture or dislocation. No erosive change. Electronically Signed   By: Lowella Grip III M.D.   On: 07/06/2015 13:00    Assessment & Plan:   There are no diagnoses linked to this encounter. I am having Ms. Umana maintain her Vitamin D, multivitamin, vitamin C, fish oil-omega-3 fatty acids, methocarbamol, amLODipine, ibuprofen, ondansetron, metoprolol succinate, predniSONE, HYDROcodone-acetaminophen, pravastatin, and losartan.  No orders of the defined types were placed in this encounter.     Follow-up: No Follow-up on file.  Walker Kehr, MD

## 2015-11-19 NOTE — Progress Notes (Signed)
Pre visit review using our clinic review tool, if applicable. No additional management support is needed unless otherwise documented below in the visit note. 

## 2015-11-19 NOTE — Assessment & Plan Note (Signed)
Rheumatoid labs have normalized Norco PRN  Potential benefits of a long term opioids use as well as potential risks (i.e. addiction risk, apnea etc) and complications (i.e. Somnolence, constipation and others) were explained to the patient and were aknowledged.

## 2015-12-11 ENCOUNTER — Ambulatory Visit (HOSPITAL_BASED_OUTPATIENT_CLINIC_OR_DEPARTMENT_OTHER): Payer: Medicare Other | Admitting: Hematology & Oncology

## 2015-12-11 ENCOUNTER — Encounter: Payer: Self-pay | Admitting: Hematology & Oncology

## 2015-12-11 ENCOUNTER — Other Ambulatory Visit (HOSPITAL_BASED_OUTPATIENT_CLINIC_OR_DEPARTMENT_OTHER): Payer: Medicare Other

## 2015-12-11 VITALS — BP 134/75 | HR 74 | Temp 98.3°F | Resp 16 | Ht 67.0 in | Wt 210.0 lb

## 2015-12-11 DIAGNOSIS — G8929 Other chronic pain: Secondary | ICD-10-CM

## 2015-12-11 DIAGNOSIS — C9000 Multiple myeloma not having achieved remission: Secondary | ICD-10-CM

## 2015-12-11 DIAGNOSIS — M25569 Pain in unspecified knee: Secondary | ICD-10-CM | POA: Diagnosis not present

## 2015-12-11 DIAGNOSIS — R634 Abnormal weight loss: Secondary | ICD-10-CM | POA: Diagnosis not present

## 2015-12-11 LAB — COMPREHENSIVE METABOLIC PANEL
ALT: 11 U/L (ref 0–55)
ANION GAP: 7 meq/L (ref 3–11)
AST: 17 U/L (ref 5–34)
Albumin: 3.5 g/dL (ref 3.5–5.0)
Alkaline Phosphatase: 85 U/L (ref 40–150)
BUN: 12.6 mg/dL (ref 7.0–26.0)
CO2: 28 meq/L (ref 22–29)
CREATININE: 0.8 mg/dL (ref 0.6–1.1)
Calcium: 9.2 mg/dL (ref 8.4–10.4)
Chloride: 106 mEq/L (ref 98–109)
EGFR: 73 mL/min/{1.73_m2} — AB (ref >90)
GLUCOSE: 78 mg/dL (ref 70–140)
Potassium: 3.8 mEq/L (ref 3.5–5.1)
Sodium: 140 mEq/L (ref 136–145)
TOTAL PROTEIN: 7.7 g/dL (ref 6.4–8.3)
Total Bilirubin: 1.3 mg/dL — ABNORMAL HIGH (ref 0.20–1.20)

## 2015-12-11 LAB — CBC WITH DIFFERENTIAL (CANCER CENTER ONLY)
BASO#: 0 10*3/uL (ref 0.0–0.2)
BASO%: 0.2 % (ref 0.0–2.0)
EOS ABS: 0.1 10*3/uL (ref 0.0–0.5)
EOS%: 1.7 % (ref 0.0–7.0)
HCT: 35.2 % (ref 34.8–46.6)
HGB: 11.8 g/dL (ref 11.6–15.9)
LYMPH#: 1.5 10*3/uL (ref 0.9–3.3)
LYMPH%: 28.3 % (ref 14.0–48.0)
MCH: 31.4 pg (ref 26.0–34.0)
MCHC: 33.5 g/dL (ref 32.0–36.0)
MCV: 94 fL (ref 81–101)
MONO#: 0.5 10*3/uL (ref 0.1–0.9)
MONO%: 8.5 % (ref 0.0–13.0)
NEUT#: 3.2 10*3/uL (ref 1.5–6.5)
NEUT%: 61.3 % (ref 39.6–80.0)
PLATELETS: 213 10*3/uL (ref 145–400)
RBC: 3.76 10*6/uL (ref 3.70–5.32)
RDW: 14.2 % (ref 11.1–15.7)
WBC: 5.3 10*3/uL (ref 3.9–10.0)

## 2015-12-11 NOTE — Progress Notes (Signed)
Hematology and Oncology Follow Up Visit  Mindy Taylor [id number] 11-29-1928 82 y.o. 12/11/2015   Principle Diagnosis:   IgG Lambda myeloma -- 11q- and t(11:14)  JEHOVAH'S WITNESS  Current Therapy:    Patient s/p c#5 of Velcade/Revlimid - patient declined any further therapy     Interim History:  Mindy Taylor is back for her follow-up. She looks much better. She's been off therapy now probably for 2 months or so. She does had a hard time with therapy and wanted her quality of life to be better.   Her last myeloma studies done back in May showed an M spike of 1.1 g/L. This is holding pretty steady. Her IgG level was 1725 mg/dL. Her Kappa Lightchain was 3 mg/dL.   She is having some knee problems. This is been chronic for her.   Her appetite is doing well. She's had no nausea or vomiting. Has been no cough or shortness of breath.   She still has an incredible amount of faith. She looks social rhythm and her age. She really is an inspiration to a lot of people.   Overall, her performance status is ECOG 1.   Medications:  Current outpatient prescriptions:  .  amLODipine (NORVASC) 5 MG tablet, TAKE 1 TABLET DAILY, Disp: 90 tablet, Rfl: 3 .  Cholecalciferol (VITAMIN D) 1000 UNITS capsule, Take 1 capsule (1,000 Units total) by mouth daily., Disp: 100 capsule, Rfl: 3 .  fish oil-omega-3 fatty acids 1000 MG capsule, Take 1 g by mouth daily.  , Disp: , Rfl:  .  HYDROcodone-acetaminophen (NORCO/VICODIN) 5-325 MG tablet, Take 0.5-1 tablets by mouth every 6 (six) hours as needed for severe pain., Disp: 90 tablet, Rfl: 0 .  ibuprofen (ADVIL) 200 MG tablet, Take 1-2 tablets (200-400 mg total) by mouth every 8 (eight) hours as needed for moderate pain., Disp: 30 tablet, Rfl: 0 .  losartan (COZAAR) 100 MG tablet, TAKE 1 TABLET EVERY EVENING, Disp: 90 tablet, Rfl: 2 .  metoprolol succinate (TOPROL-XL) 50 MG 24 hr tablet, TAKE 1 TABLET TWICE A DAY, Disp: 180 tablet, Rfl: 2 .  Multiple Vitamin  (MULTIVITAMIN) tablet, Take 1 tablet by mouth daily.  , Disp: , Rfl:  .  pravastatin (PRAVACHOL) 40 MG tablet, TAKE 1 TABLET DAILY, Disp: 90 tablet, Rfl: 2 .  vitamin C (ASCORBIC ACID) 500 MG tablet, Take 500 mg by mouth daily.  , Disp: , Rfl:   Allergies:  Allergies  Allergen Reactions  . Bee Venom Swelling  . Ceftin [Cefuroxime Axetil]     Felt wierd  . Spider Antivenin [Antivenin Latrodectus Mactans] Other (See Comments)    pain    Past Medical History, Surgical history, Social history, and Family History were reviewed and updated.  Review of Systems: As above  Physical Exam:  height is 5\' 7"  (1.702 m) and weight is 210 lb (95.255 kg). Her oral temperature is 98.3 F (36.8 C). Her blood pressure is 134/75 and her pulse is 74. Her respiration is 16.   Wt Readings from Last 3 Encounters:  12/11/15 210 lb (95.255 kg)  11/19/15 207 lb (93.895 kg)  10/12/15 200 lb (90.719 kg)     Elderly but fairly well-nourished African-American female in no obvious distress. Head and neck exam shows no ocular or oral lesions.I do not see any obvious cataracts. Her pupils react appropriately. She has no palpable cervical or supraclavicular lymph nodes. Lungs are clear bilaterally. Cardiac exam regular rate and rhythm with no murmurs, rubs or bruits. Abdomen  is soft. She has good bowel sounds. There is no fluid wave. There is no palpable liver or spleen tip. Back exam shows no tenderness over the spine, ribs or hips. She has no kyphosis. Skin exam shows no rashes, ecchymoses or petechia. Neurological exam shows no focal neurological deficits.  Lab Results  Component Value Date   WBC 5.3 12/11/2015   HGB 11.8 12/11/2015   HCT 35.2 12/11/2015   MCV 94 12/11/2015   PLT 213 12/11/2015     Chemistry      Component Value Date/Time   NA 139 10/08/2015 0956   NA 138 07/09/2015 1139   NA 139 05/07/2015 1628   K 3.7 10/08/2015 0956   K 3.6 07/09/2015 1139   K 3.7 05/07/2015 1628   CL 107  10/08/2015 0956   CL 104 05/07/2015 1628   CO2 25 10/08/2015 0956   CO2 25 07/09/2015 1139   CO2 30 05/07/2015 1628   BUN 13 10/08/2015 0956   BUN 14.9 07/09/2015 1139   BUN 11 05/07/2015 1628   CREATININE 0.8 10/08/2015 0956   CREATININE 0.8 07/09/2015 1139   CREATININE 0.70 05/07/2015 1628      Component Value Date/Time   CALCIUM 8.7 10/08/2015 0956   CALCIUM 8.8 07/09/2015 1139   CALCIUM 9.5 05/07/2015 1628   CALCIUM 9.5 10/02/2011 1601   ALKPHOS 72 10/08/2015 0956   ALKPHOS 68 07/09/2015 1139   ALKPHOS 72 05/07/2015 1628   AST 21 10/08/2015 0956   AST 19 07/09/2015 1139   AST 15 05/07/2015 1628   ALT 16 10/08/2015 0956   ALT 19 07/09/2015 1139   ALT 12 05/07/2015 1628   BILITOT 1.10 10/08/2015 0956   BILITOT 0.81 07/09/2015 1139   BILITOT 0.6 05/07/2015 1628         Impression and Plan: Mindy Taylor is a 82 year old African-American female. She certainly looks younger. She has IgG lambda myeloma. She has 2 chromosomal abnormalities by FISH.  I And very thankful that she is doing so well. We will have to see what her myeloma studies look like. Even if they are a little bit elevated, I don't think this will be a problem as she is totally asymptomatic.  Her knees or her biggest problem right now. I am not sure she will get this taken care of. She does see an orthopedist.  I think we get her back now in 3 months. Adding this is reasonable.  I spent about 25 minutes with she and her sister.   Again, she is a JEHOVAH'S WITNESS !!   [name], MD 7/18/201710:46 AM

## 2015-12-12 LAB — KAPPA/LAMBDA LIGHT CHAINS
Ig Kappa Free Light Chain: 24 mg/L — ABNORMAL HIGH (ref 3.3–19.4)
Ig Lambda Free Light Chain: 14.4 mg/L (ref 5.7–26.3)
Kappa/Lambda FluidC Ratio: 1.67 — ABNORMAL HIGH (ref 0.26–1.65)

## 2015-12-12 LAB — IGG, IGA, IGM
IGA/IMMUNOGLOBULIN A, SERUM: 37 mg/dL — AB (ref 64–422)
IgM, Qn, Serum: 35 mg/dL (ref 26–217)

## 2015-12-14 LAB — PROTEIN ELECTROPHORESIS, SERUM, WITH REFLEX
A/G Ratio: 0.9 (ref 0.7–1.7)
Albumin: 3.3 g/dL (ref 2.9–4.4)
Alpha 1: 0.2 g/dL (ref 0.0–0.4)
Alpha 2: 0.8 g/dL (ref 0.4–1.0)
Beta: 1 g/dL (ref 0.7–1.3)
Gamma Globulin: 1.7 g/dL (ref 0.4–1.8)
Globulin, Total: 3.7 g/dL (ref 2.2–3.9)
Interpretation(See Below): 0
M-Spike, %: 1.3 g/dL — ABNORMAL HIGH
Total Protein: 7 g/dL (ref 6.0–8.5)

## 2015-12-17 ENCOUNTER — Encounter: Payer: Self-pay | Admitting: *Deleted

## 2015-12-21 ENCOUNTER — Emergency Department (HOSPITAL_BASED_OUTPATIENT_CLINIC_OR_DEPARTMENT_OTHER): Payer: Medicare Other

## 2015-12-21 ENCOUNTER — Encounter (HOSPITAL_BASED_OUTPATIENT_CLINIC_OR_DEPARTMENT_OTHER): Payer: Self-pay | Admitting: *Deleted

## 2015-12-21 ENCOUNTER — Emergency Department (HOSPITAL_BASED_OUTPATIENT_CLINIC_OR_DEPARTMENT_OTHER)
Admission: EM | Admit: 2015-12-21 | Discharge: 2015-12-21 | Disposition: A | Discharge status: Home / Self Care | Payer: Medicare Other | Source: Home / Self Care | Attending: Emergency Medicine | Admitting: Emergency Medicine

## 2015-12-21 DIAGNOSIS — Y929 Unspecified place or not applicable: Secondary | ICD-10-CM

## 2015-12-21 DIAGNOSIS — Y939 Activity, unspecified: Secondary | ICD-10-CM | POA: Insufficient documentation

## 2015-12-21 DIAGNOSIS — Z87891 Personal history of nicotine dependence: Secondary | ICD-10-CM

## 2015-12-21 DIAGNOSIS — M65822 Other synovitis and tenosynovitis, left upper arm: Secondary | ICD-10-CM | POA: Diagnosis not present

## 2015-12-21 DIAGNOSIS — E785 Hyperlipidemia, unspecified: Secondary | ICD-10-CM | POA: Diagnosis not present

## 2015-12-21 DIAGNOSIS — X500XXA Overexertion from strenuous movement or load, initial encounter: Secondary | ICD-10-CM

## 2015-12-21 DIAGNOSIS — C9000 Multiple myeloma not having achieved remission: Secondary | ICD-10-CM | POA: Diagnosis not present

## 2015-12-21 DIAGNOSIS — S39012A Strain of muscle, fascia and tendon of lower back, initial encounter: Secondary | ICD-10-CM | POA: Insufficient documentation

## 2015-12-21 DIAGNOSIS — I10 Essential (primary) hypertension: Secondary | ICD-10-CM

## 2015-12-21 DIAGNOSIS — Y999 Unspecified external cause status: Secondary | ICD-10-CM

## 2015-12-21 DIAGNOSIS — N390 Urinary tract infection, site not specified: Secondary | ICD-10-CM

## 2015-12-21 DIAGNOSIS — Z79899 Other long term (current) drug therapy: Secondary | ICD-10-CM

## 2015-12-21 DIAGNOSIS — R0602 Shortness of breath: Secondary | ICD-10-CM | POA: Insufficient documentation

## 2015-12-21 DIAGNOSIS — L03114 Cellulitis of left upper limb: Secondary | ICD-10-CM | POA: Diagnosis not present

## 2015-12-21 DIAGNOSIS — R079 Chest pain, unspecified: Secondary | ICD-10-CM | POA: Diagnosis not present

## 2015-12-21 DIAGNOSIS — S233XXA Sprain of ligaments of thoracic spine, initial encounter: Secondary | ICD-10-CM | POA: Diagnosis not present

## 2015-12-21 DIAGNOSIS — M19022 Primary osteoarthritis, left elbow: Secondary | ICD-10-CM | POA: Diagnosis not present

## 2015-12-21 DIAGNOSIS — M7989 Other specified soft tissue disorders: Secondary | ICD-10-CM | POA: Diagnosis not present

## 2015-12-21 DIAGNOSIS — S63602A Unspecified sprain of left thumb, initial encounter: Secondary | ICD-10-CM | POA: Insufficient documentation

## 2015-12-21 DIAGNOSIS — D638 Anemia in other chronic diseases classified elsewhere: Secondary | ICD-10-CM | POA: Diagnosis not present

## 2015-12-21 LAB — URINE MICROSCOPIC-ADD ON

## 2015-12-21 LAB — COMPREHENSIVE METABOLIC PANEL
ALT: 10 U/L — ABNORMAL LOW (ref 14–54)
ANION GAP: 8 (ref 5–15)
AST: 17 U/L (ref 15–41)
Albumin: 3.7 g/dL (ref 3.5–5.0)
Alkaline Phosphatase: 83 U/L (ref 38–126)
BUN: 9 mg/dL (ref 6–20)
CALCIUM: 8.9 mg/dL (ref 8.9–10.3)
CHLORIDE: 100 mmol/L — AB (ref 101–111)
CO2: 28 mmol/L (ref 22–32)
CREATININE: 0.65 mg/dL (ref 0.44–1.00)
Glucose, Bld: 106 mg/dL — ABNORMAL HIGH (ref 65–99)
Potassium: 3.4 mmol/L — ABNORMAL LOW (ref 3.5–5.1)
SODIUM: 136 mmol/L (ref 135–145)
Total Bilirubin: 1.4 mg/dL — ABNORMAL HIGH (ref 0.3–1.2)
Total Protein: 8.7 g/dL — ABNORMAL HIGH (ref 6.5–8.1)

## 2015-12-21 LAB — URINALYSIS, ROUTINE W REFLEX MICROSCOPIC
Bilirubin Urine: NEGATIVE
GLUCOSE, UA: NEGATIVE mg/dL
KETONES UR: NEGATIVE mg/dL
Nitrite: NEGATIVE
PH: 6 (ref 5.0–8.0)
Protein, ur: NEGATIVE mg/dL
Specific Gravity, Urine: 1.02 (ref 1.005–1.030)

## 2015-12-21 LAB — CBC
HCT: 35.8 % — ABNORMAL LOW (ref 36.0–46.0)
Hemoglobin: 11.8 g/dL — ABNORMAL LOW (ref 12.0–15.0)
MCH: 30.6 pg (ref 26.0–34.0)
MCHC: 33 g/dL (ref 30.0–36.0)
MCV: 93 fL (ref 78.0–100.0)
PLATELETS: 215 10*3/uL (ref 150–400)
RBC: 3.85 MIL/uL — ABNORMAL LOW (ref 3.87–5.11)
RDW: 13.7 % (ref 11.5–15.5)
WBC: 7.5 10*3/uL (ref 4.0–10.5)

## 2015-12-21 LAB — TROPONIN I

## 2015-12-21 LAB — BRAIN NATRIURETIC PEPTIDE: B NATRIURETIC PEPTIDE 5: 31.3 pg/mL (ref 0.0–100.0)

## 2015-12-21 MED ORDER — OXYCODONE-ACETAMINOPHEN 5-325 MG PO TABS: 1.0000 | ORAL_TABLET | Freq: Four times a day (QID) | ORAL | 0 refills | Status: DC | PRN

## 2015-12-21 MED ORDER — SULFAMETHOXAZOLE-TRIMETHOPRIM 800-160 MG PO TABS
1.0000 | ORAL_TABLET | Freq: Once | ORAL | Status: AC
  Administered 2015-12-21: 1 via ORAL
  Filled 2015-12-21: qty 1

## 2015-12-21 MED ORDER — ONDANSETRON HCL 4 MG/2ML IJ SOLN
4.0000 mg | Freq: Once | INTRAMUSCULAR | Status: AC
  Administered 2015-12-21: 4 mg via INTRAVENOUS
  Filled 2015-12-21: qty 2

## 2015-12-21 MED ORDER — MORPHINE SULFATE (PF) 4 MG/ML IV SOLN
4.0000 mg | Freq: Once | INTRAVENOUS | Status: AC
  Administered 2015-12-21: 4 mg via INTRAVENOUS
  Filled 2015-12-21: qty 1

## 2015-12-21 MED ORDER — SULFAMETHOXAZOLE-TRIMETHOPRIM 800-160 MG PO TABS: 1.0000 | ORAL_TABLET | Freq: Two times a day (BID) | ORAL | 0 refills | Status: DC

## 2015-12-21 NOTE — ED Triage Notes (Signed)
Chest pain x 2 days. States it is muscle pain that moves across her back. SOB. Left hand is swollen.

## 2015-12-21 NOTE — ED Provider Notes (Signed)
[facility] Provider Note   CSN: [id number] Arrival date & time: 12/21/15  1950  First Provider Contact:  None    By signing my name below, I, Julien Nordmann, attest that this documentation has been prepared under the direction and in the presence of Isla Pence, MD.  Electronically Signed: Julien Nordmann, ED Scribe. 12/21/15. 8:14 PM.    History   Chief Complaint Chief Complaint  Patient presents with  . Chest Pain    The history is provided by the patient. No language interpreter was used.   HPI Comments: Mindy Taylor is a 82 y.o. female who has PMhx of ataxia, HLD, HTN, lacunar infarction, neck apin, onychomycosis and paresthesia presents to the Emergency Department complaining of sudden onset, moderate, central thoracic back pain onset 3 days ago.  This occurred after picking up something heavy.   She notes associated swelling to her left hand and left thumb. Pt states that she lifted a heavy object that injured her back and "jammed" her left hand. She expresses increased pain with movement and breathing. Pt notes she took advil to alleviate her pain with no relief. She denies fever oir chills.  Past Medical History:  Diagnosis Date  . ANXIETY 03/05/2007  . ATAXIA 12/26/2009  . BEE STING 03/05/2007  . BRONCHITIS, ACUTE 06/28/2010  . CANDIDIASIS, VAGINAL 09/08/2008  . CEREBROVASCULAR ACCIDENT, HX OF 01/04/2010  . CERUMEN IMPACTION 06/05/2008  . DIZZINESS 12/26/2009  . Epistaxis 06/05/2008  . Headache(784.0) 06/05/2008  . HYPERLIPIDEMIA 12/08/2006  . HYPERTENSION 12/08/2006  . 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 Multiple myeloma ([place]) 04/03/2015  . NECK PAIN 06/05/2008  . OBESITY 09/07/2009  . ONYCHOMYCOSIS 03/09/2009  . OSTEOARTHRITIS 12/08/2006  . PARESTHESIA 12/26/2009  . TOBACCO USE, QUIT 03/09/2009    Patient Active Problem List   Diagnosis Date Noted  . Well adult exam 11/19/2015  . Loss of weight 10/12/2015  . Normocytic anemia 05/07/2015  . Multiple myeloma  ([place]) 04/03/2015  . Arthralgia 02/01/2015  . Right wrist pain 12/01/2014  . Arm pain, left 11/22/2014  . No blood products 03/11/2014  . Right knee pain 07/26/2013  . Chest pain 04/26/2013  . Left ankle swelling 03/04/2013  . Clostridium difficile diarrhea 10/22/2012  . Right hand pain 09/08/2012  . Elbow pain, right 03/15/2012  . Knee pain, left 10/02/2011  . Hypercalcemia 10/02/2011  . Hyperglycemia 08/08/2011  . Neck pain, chronic 05/05/2011  . Knee pain, bilateral 02/18/2011  . Fatigue 01/03/2011  . Bronchitis, acute, with bronchospasm 12/11/2010  . Wheezing 11/28/2010  . Preventative health care 11/28/2010  . Cerebral artery occlusion with cerebral infarction ([place]) 01/04/2010  . CVA (cerebral vascular accident) ([place]) 01/04/2010  . DIZZINESS 12/26/2009  . ATAXIA 12/26/2009  . PARESTHESIA 12/26/2009  . Obesity 09/07/2009  . ONYCHOMYCOSIS 03/09/2009  . TOBACCO USE, QUIT 03/09/2009  . CANDIDIASIS, VAGINAL 09/08/2008  . CERUMEN IMPACTION 06/05/2008  . Headache(784.0) 06/05/2008  . Epistaxis 06/05/2008  . Anxiety state 03/05/2007  . BEE STING 03/05/2007  . Dyslipidemia 12/08/2006  . Essential hypertension 12/08/2006  . Osteoarthritis 12/08/2006    Past Surgical History:  Procedure Laterality Date  . ABDOMINAL HYSTERECTOMY    . BREAST SURGERY      OB History    No data available       Home Medications    Prior to Admission medications   Medication Sig Start Date End Date Taking? Authorizing Provider  amLODipine (NORVASC) 5 MG tablet TAKE 1 TABLET DAILY 01/11/15   Evie Lacks  Plotnikov, MD  Cholecalciferol (VITAMIN D) 1000 UNITS capsule Take 1 capsule (1,000 Units total) by mouth daily. 01/03/11   Evie Lacks Plotnikov, MD  fish oil-omega-3 fatty acids 1000 MG capsule Take 1 g by mouth daily.      Historical Provider, MD  HYDROcodone-acetaminophen (NORCO/VICODIN) 5-325 MG tablet Take 0.5-1 tablets by mouth every 6 (six) hours as needed for severe pain. 10/12/15    Evie Lacks Plotnikov, MD  ibuprofen (ADVIL) 200 MG tablet Take 1-2 tablets (200-400 mg total) by mouth every 8 (eight) hours as needed for moderate pain. 02/01/15   Cassandria Anger, MD  losartan (COZAAR) 100 MG tablet TAKE 1 TABLET EVERY EVENING 10/29/15   Biagio Borg, MD  metoprolol succinate (TOPROL-XL) 50 MG 24 hr tablet TAKE 1 TABLET TWICE A DAY 10/08/15   Cassandria Anger, MD  Multiple Vitamin (MULTIVITAMIN) tablet Take 1 tablet by mouth daily.      Historical Provider, MD  oxyCODONE-acetaminophen (PERCOCET/ROXICET) 5-325 MG tablet Take 1 tablet by mouth every 6 (six) hours as needed for severe pain. 12/21/15   Isla Pence, MD  pravastatin (PRAVACHOL) 40 MG tablet TAKE 1 TABLET DAILY 10/15/15   Cassandria Anger, MD  sulfamethoxazole-trimethoprim (BACTRIM DS,SEPTRA DS) 800-160 MG tablet Take 1 tablet by mouth 2 (two) times daily. 12/21/15 12/28/15  Isla Pence, MD  vitamin C (ASCORBIC ACID) 500 MG tablet Take 500 mg by mouth daily.      Historical Provider, MD    Family History Family History  Problem Relation Age of Onset  . Hypertension Mother   . Hypertension Father   . Hypertension Other     Social History Social History  Substance Use Topics  . Smoking status: Former Research scientist (life sciences)  . Smokeless tobacco: Never Used     Comment: Not sure when; many, many years   . Alcohol use No     Allergies   Bee venom; Ceftin [cefuroxime axetil]; and Spider antivenin [antivenin latrodectus mactans]   Review of Systems Review of Systems  Constitutional: Negative for chills and fever.  Musculoskeletal: Positive for back pain.  All other systems reviewed and are negative.    Physical Exam Updated Vital Signs BP 150/73   Pulse 87   Temp 98.1 F (36.7 C) (Oral)   Resp 17   Ht '5\' 7"'$  (1.702 m)   Wt 210 lb (95.3 kg)   SpO2 98%   BMI 32.89 kg/m   Physical Exam  Constitutional: She is oriented to person, place, and time. She appears well-developed and well-nourished.  HENT:    Head: Normocephalic.  Eyes: EOM are normal.  Neck: Normal range of motion.  Pulmonary/Chest: Effort normal.  Abdominal: She exhibits no distension.  Musculoskeletal: Normal range of motion. She exhibits tenderness.  Swollen left thumb with tenderness Tenderness to the central thoracic region  Neurological: She is alert and oriented to person, place, and time.  Psychiatric: She has a normal mood and affect.  Nursing note and vitals reviewed.    ED Treatments / Results  DIAGNOSTIC STUDIES: Oxygen Saturation is 99% on RA, normal by my interpretation.  COORDINATION OF CARE:  8:13 PM Discussed treatment plan with pt at bedside and pt agreed to plan.  Labs (all labs ordered are listed, but only abnormal results are displayed) Labs Reviewed  CBC - Abnormal; Notable for the following:       Result Value   RBC 3.85 (*)    Hemoglobin 11.8 (*)    HCT 35.8 (*)  All other components within normal limits  COMPREHENSIVE METABOLIC PANEL - Abnormal; Notable for the following:    Potassium 3.4 (*)    Chloride 100 (*)    Glucose, Bld 106 (*)    Total Protein 8.7 (*)    ALT 10 (*)    Total Bilirubin 1.4 (*)    All other components within normal limits  URINALYSIS, ROUTINE W REFLEX MICROSCOPIC (NOT AT [facility]) - Abnormal; Notable for the following:    Hgb urine dipstick MODERATE (*)    Leukocytes, UA SMALL (*)    All other components within normal limits  URINE MICROSCOPIC-ADD ON - Abnormal; Notable for the following:    Squamous Epithelial / LPF 0-5 (*)    Bacteria, UA FEW (*)    All other components within normal limits  BRAIN NATRIURETIC PEPTIDE  TROPONIN I    EKG  EKG Interpretation None       Radiology Dg Chest 2 View  Result Date: 12/21/2015 CLINICAL DATA:  Chest pain.  Pain with breathing. EXAM: CHEST  2 VIEW COMPARISON:  Chest CT 03/12/2014.  Two-view chest x-ray 03/10/2014. FINDINGS: Although the heart size is normal. Mild interstitial coarsening is chronic. No focal  airspace disease is present. There is no edema or effusion to suggest failure. Multilevel degenerative changes of the cervical spine are similar to the prior study. There is some exaggeration of the thoracic kyphosis. IMPRESSION: 1. No acute cardiopulmonary disease or significant interval change. Electronically Signed   By: San Morelle M.D.   On: 12/21/2015 20:40  Dg Hand Complete Left  Result Date: 12/21/2015 CLINICAL DATA:  Pt states that she jammed her thumb lifting water x 3 days ago, pt co pain and swelling in thumb, increased pain with movement EXAM: LEFT HAND - COMPLETE 3+ VIEW COMPARISON:  None. FINDINGS: No fracture or dislocation. There are arthropathic changes. At the trapezium first metacarpal articulation, there is joint space narrowing and marginal osteophytes. There is more significant joint space narrowing at the first metacarpal phalangeal joint. There is mild narrowing, asymmetric, multiple interphalangeal joints, most evident involving the IP joint of the thumb. There is more significant narrowing of the third metacarpal phalangeal joint with mild anterior subluxation. The bones are diffusely demineralized. Soft tissues are unremarkable. IMPRESSION: 1. No convincing fracture.  No dislocation. 2. Arthropathic/degenerative changes as detailed. Electronically Signed   By: Lajean Manes M.D.   On: 12/21/2015 20:42   Procedures Procedures (including critical care time)  Medications Ordered in ED Medications  sulfamethoxazole-trimethoprim (BACTRIM DS,SEPTRA DS) 800-160 MG per tablet 1 tablet (not administered)  morphine 4 MG/ML injection 4 mg (4 mg Intravenous Given 12/21/15 2039)  ondansetron (ZOFRAN) injection 4 mg (4 mg Intravenous Given 12/21/15 2040)     Initial Impression / Assessment and Plan / ED Course  I have reviewed the triage vital signs and the nursing notes.  Pertinent labs & imaging results that were available during my care of the patient were reviewed by me  and considered in my medical decision making (see chart for details).  Clinical Course  Pt will be placed in a thumb spica splint and encouraged to f/u with ortho.  Pt knows to return if worse.  Final Clinical Impressions(s) / ED Diagnoses   Final diagnoses:  Back strain, initial encounter  Thumb sprain, left, initial encounter  UTI (lower urinary tract infection)   I personally performed the services described in this documentation, which was scribed in my presence. The recorded information has been reviewed  and is accurate.  New Prescriptions New Prescriptions   OXYCODONE-ACETAMINOPHEN (PERCOCET/ROXICET) 5-325 MG TABLET    Take 1 tablet by mouth every 6 (six) hours as needed for severe pain.   SULFAMETHOXAZOLE-TRIMETHOPRIM (BACTRIM DS,SEPTRA DS) 800-160 MG TABLET    Take 1 tablet by mouth 2 (two) times daily.     Isla Pence, MD 12/21/15 2218

## 2015-12-23 ENCOUNTER — Inpatient Hospital Stay (HOSPITAL_BASED_OUTPATIENT_CLINIC_OR_DEPARTMENT_OTHER)
Admission: EM | Admit: 2015-12-23 | Discharge: 2015-12-25 | DRG: 603 | Disposition: A | Discharge status: Home / Self Care | Payer: Medicare Other | Attending: Internal Medicine | Admitting: Internal Medicine

## 2015-12-23 ENCOUNTER — Emergency Department (HOSPITAL_BASED_OUTPATIENT_CLINIC_OR_DEPARTMENT_OTHER): Payer: Medicare Other

## 2015-12-23 ENCOUNTER — Encounter (HOSPITAL_BASED_OUTPATIENT_CLINIC_OR_DEPARTMENT_OTHER): Payer: Self-pay | Admitting: *Deleted

## 2015-12-23 DIAGNOSIS — I10 Essential (primary) hypertension: Secondary | ICD-10-CM | POA: Diagnosis present

## 2015-12-23 DIAGNOSIS — L03114 Cellulitis of left upper limb: Principal | ICD-10-CM | POA: Diagnosis present

## 2015-12-23 DIAGNOSIS — L0390 Cellulitis, unspecified: Secondary | ICD-10-CM | POA: Diagnosis present

## 2015-12-23 DIAGNOSIS — M19022 Primary osteoarthritis, left elbow: Secondary | ICD-10-CM | POA: Diagnosis not present

## 2015-12-23 DIAGNOSIS — Z87891 Personal history of nicotine dependence: Secondary | ICD-10-CM

## 2015-12-23 DIAGNOSIS — C9000 Multiple myeloma not having achieved remission: Secondary | ICD-10-CM | POA: Diagnosis present

## 2015-12-23 DIAGNOSIS — E669 Obesity, unspecified: Secondary | ICD-10-CM | POA: Diagnosis present

## 2015-12-23 DIAGNOSIS — D638 Anemia in other chronic diseases classified elsewhere: Secondary | ICD-10-CM | POA: Diagnosis present

## 2015-12-23 DIAGNOSIS — M65822 Other synovitis and tenosynovitis, left upper arm: Secondary | ICD-10-CM | POA: Diagnosis present

## 2015-12-23 DIAGNOSIS — Z6832 Body mass index (BMI) 32.0-32.9, adult: Secondary | ICD-10-CM

## 2015-12-23 DIAGNOSIS — E785 Hyperlipidemia, unspecified: Secondary | ICD-10-CM

## 2015-12-23 DIAGNOSIS — M1990 Unspecified osteoarthritis, unspecified site: Secondary | ICD-10-CM | POA: Diagnosis present

## 2015-12-23 DIAGNOSIS — Z8673 Personal history of transient ischemic attack (TIA), and cerebral infarction without residual deficits: Secondary | ICD-10-CM

## 2015-12-23 LAB — BASIC METABOLIC PANEL
ANION GAP: 14 (ref 5–15)
BUN: 18 mg/dL (ref 6–20)
CHLORIDE: 99 mmol/L — AB (ref 101–111)
CO2: 23 mmol/L (ref 22–32)
Calcium: 9.3 mg/dL (ref 8.9–10.3)
Creatinine, Ser: 0.94 mg/dL (ref 0.44–1.00)
GFR calc non Af Amer: 54 mL/min — ABNORMAL LOW (ref >60)
Glucose, Bld: 102 mg/dL — ABNORMAL HIGH (ref 65–99)
Potassium: 4.5 mmol/L (ref 3.5–5.1)
SODIUM: 136 mmol/L (ref 135–145)

## 2015-12-23 LAB — CBC WITH DIFFERENTIAL/PLATELET
BASOS ABS: 0 10*3/uL (ref 0.0–0.1)
BASOS PCT: 0 %
EOS ABS: 0 10*3/uL (ref 0.0–0.7)
Eosinophils Relative: 0 %
HEMATOCRIT: 34.4 % — AB (ref 36.0–46.0)
HEMOGLOBIN: 11.5 g/dL — AB (ref 12.0–15.0)
Lymphocytes Relative: 10 %
Lymphs Abs: 1 10*3/uL (ref 0.7–4.0)
MCH: 31.1 pg (ref 26.0–34.0)
MCHC: 33.4 g/dL (ref 30.0–36.0)
MCV: 93 fL (ref 78.0–100.0)
Monocytes Absolute: 0.8 10*3/uL (ref 0.1–1.0)
Monocytes Relative: 8 %
NEUTROS ABS: 7.8 10*3/uL — AB (ref 1.7–7.7)
NEUTROS PCT: 82 %
Platelets: 240 10*3/uL (ref 150–400)
RBC: 3.7 MIL/uL — AB (ref 3.87–5.11)
RDW: 13.7 % (ref 11.5–15.5)
WBC: 9.6 10*3/uL (ref 4.0–10.5)

## 2015-12-23 LAB — I-STAT CG4 LACTIC ACID, ED: Lactic Acid, Venous: 0.54 mmol/L (ref 0.5–1.9)

## 2015-12-23 MED ORDER — DEXTROSE 5 % IV SOLN
1.0000 g | Freq: Once | INTRAVENOUS | Status: AC
  Administered 2015-12-23: 1 g via INTRAVENOUS
  Filled 2015-12-23: qty 10

## 2015-12-23 MED ORDER — ONDANSETRON HCL 4 MG/2ML IJ SOLN
4.0000 mg | Freq: Once | INTRAMUSCULAR | Status: AC
  Administered 2015-12-23: 4 mg via INTRAVENOUS
  Filled 2015-12-23: qty 2

## 2015-12-23 MED ORDER — MORPHINE SULFATE (PF) 4 MG/ML IV SOLN
4.0000 mg | Freq: Once | INTRAVENOUS | Status: AC
  Administered 2015-12-23: 4 mg via INTRAVENOUS
  Filled 2015-12-23: qty 1

## 2015-12-23 MED ORDER — SODIUM CHLORIDE 0.9 % IV SOLN
INTRAVENOUS | Status: DC
  Administered 2015-12-24: 01:00:00 via INTRAVENOUS

## 2015-12-23 MED ORDER — MORPHINE SULFATE (PF) 2 MG/ML IV SOLN
2.0000 mg | INTRAVENOUS | Status: AC | PRN
  Administered 2015-12-23 – 2015-12-24 (×3): 2 mg via INTRAVENOUS
  Filled 2015-12-23 (×3): qty 1

## 2015-12-23 NOTE — ED Provider Notes (Signed)
[facility] Provider Note   CSN: [id number] Arrival date & time: 12/23/15  1521  First Provider Contact:  First MD Initiated Contact with Patient 12/23/15 1810     By signing my name below, I, Randa Evens, attest that this documentation has been prepared under the direction and in the presence of Carlisle Cater, PA-C. Electronically Signed: Randa Evens, ED Scribe. 12/23/15. 6:25 PM.     History   Chief Complaint Chief Complaint  Patient presents with  . Hand Injury    HPI Mindy Taylor is a 82 y.o. female.  The history is provided by the patient. No language interpreter was used.  Hand Injury   Pertinent negatives include no fever.   HPI Comments: Mindy Taylor is a 82 y.o. female who presents to the Emergency Department complaining of worsening left hand pain and swelling onset 5 days prior. Pt also reports that the pain and swelling is radiating up her arm to her left elbow. Pt states that she initially injured the hand by "jamming" her thumb 6 days ago. She states that the pain is worse with any movement. She reports that she was evaluated in the ED 2 days for the same hand pain and was given a brace. She states that the brace is not helping. Pt states that she has been taking percocet's with only temporary relief. She was also taking Bactrim for UTI identified at last visit. Pt denies fever, numbness or tinging. No h/o DM.    Past Medical History:  Diagnosis Date  . ANXIETY 03/05/2007  . ATAXIA 12/26/2009  . BEE STING 03/05/2007  . BRONCHITIS, ACUTE 06/28/2010  . CANDIDIASIS, VAGINAL 09/08/2008  . CEREBROVASCULAR ACCIDENT, HX OF 01/04/2010  . CERUMEN IMPACTION 06/05/2008  . DIZZINESS 12/26/2009  . Epistaxis 06/05/2008  . Headache(784.0) 06/05/2008  . HYPERLIPIDEMIA 12/08/2006  . HYPERTENSION 12/08/2006  . 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 Multiple myeloma ([place]) 04/03/2015  . NECK PAIN 06/05/2008  . OBESITY 09/07/2009  . ONYCHOMYCOSIS 03/09/2009  .  OSTEOARTHRITIS 12/08/2006  . PARESTHESIA 12/26/2009  . TOBACCO USE, QUIT 03/09/2009    Patient Active Problem List   Diagnosis Date Noted  . Well adult exam 11/19/2015  . Loss of weight 10/12/2015  . Normocytic anemia 05/07/2015  . Multiple myeloma ([place]) 04/03/2015  . Arthralgia 02/01/2015  . Right wrist pain 12/01/2014  . Arm pain, left 11/22/2014  . No blood products 03/11/2014  . Right knee pain 07/26/2013  . Chest pain 04/26/2013  . Left ankle swelling 03/04/2013  . Clostridium difficile diarrhea 10/22/2012  . Right hand pain 09/08/2012  . Elbow pain, right 03/15/2012  . Knee pain, left 10/02/2011  . Hypercalcemia 10/02/2011  . Hyperglycemia 08/08/2011  . Neck pain, chronic 05/05/2011  . Knee pain, bilateral 02/18/2011  . Fatigue 01/03/2011  . Bronchitis, acute, with bronchospasm 12/11/2010  . Wheezing 11/28/2010  . Preventative health care 11/28/2010  . Cerebral artery occlusion with cerebral infarction ([place]) 01/04/2010  . CVA (cerebral vascular accident) ([place]) 01/04/2010  . DIZZINESS 12/26/2009  . ATAXIA 12/26/2009  . PARESTHESIA 12/26/2009  . Obesity 09/07/2009  . ONYCHOMYCOSIS 03/09/2009  . TOBACCO USE, QUIT 03/09/2009  . CANDIDIASIS, VAGINAL 09/08/2008  . CERUMEN IMPACTION 06/05/2008  . Headache(784.0) 06/05/2008  . Epistaxis 06/05/2008  . Anxiety state 03/05/2007  . BEE STING 03/05/2007  . Dyslipidemia 12/08/2006  . Essential hypertension 12/08/2006  . Osteoarthritis 12/08/2006    Past Surgical History:  Procedure Laterality Date  . ABDOMINAL HYSTERECTOMY    .  BREAST SURGERY      OB History    No data available       Home Medications    Prior to Admission medications   Medication Sig Start Date End Date Taking? Authorizing Provider  amLODipine (NORVASC) 5 MG tablet TAKE 1 TABLET DAILY 01/11/15   Cassandria Anger, MD  Cholecalciferol (VITAMIN D) 1000 UNITS capsule Take 1 capsule (1,000 Units total) by mouth daily. 01/03/11   Evie Lacks  Plotnikov, MD  fish oil-omega-3 fatty acids 1000 MG capsule Take 1 g by mouth daily.      Historical Provider, MD  HYDROcodone-acetaminophen (NORCO/VICODIN) 5-325 MG tablet Take 0.5-1 tablets by mouth every 6 (six) hours as needed for severe pain. 10/12/15   Evie Lacks Plotnikov, MD  ibuprofen (ADVIL) 200 MG tablet Take 1-2 tablets (200-400 mg total) by mouth every 8 (eight) hours as needed for moderate pain. 02/01/15   Cassandria Anger, MD  losartan (COZAAR) 100 MG tablet TAKE 1 TABLET EVERY EVENING 10/29/15   Biagio Borg, MD  metoprolol succinate (TOPROL-XL) 50 MG 24 hr tablet TAKE 1 TABLET TWICE A DAY 10/08/15   Cassandria Anger, MD  Multiple Vitamin (MULTIVITAMIN) tablet Take 1 tablet by mouth daily.      Historical Provider, MD  oxyCODONE-acetaminophen (PERCOCET/ROXICET) 5-325 MG tablet Take 1 tablet by mouth every 6 (six) hours as needed for severe pain. 12/21/15   Isla Pence, MD  pravastatin (PRAVACHOL) 40 MG tablet TAKE 1 TABLET DAILY 10/15/15   Cassandria Anger, MD  sulfamethoxazole-trimethoprim (BACTRIM DS,SEPTRA DS) 800-160 MG tablet Take 1 tablet by mouth 2 (two) times daily. 12/21/15 12/28/15  Isla Pence, MD  vitamin C (ASCORBIC ACID) 500 MG tablet Take 500 mg by mouth daily.      Historical Provider, MD    Family History Family History  Problem Relation Age of Onset  . Hypertension Mother   . Hypertension Father   . Hypertension Other     Social History Social History  Substance Use Topics  . Smoking status: Former Research scientist (life sciences)  . Smokeless tobacco: Never Used     Comment: Not sure when; many, many years   . Alcohol use No     Allergies   Bee venom; Ceftin [cefuroxime axetil]; and Spider antivenin [antivenin latrodectus mactans]   Review of Systems Review of Systems  Constitutional: Negative for fever.  HENT: Negative for rhinorrhea and sore throat.   Eyes: Negative for redness.  Respiratory: Negative for cough.   Cardiovascular: Negative for chest pain.    Gastrointestinal: Negative for abdominal pain, diarrhea, nausea and vomiting.  Genitourinary: Negative for dysuria.  Musculoskeletal: Positive for arthralgias, joint swelling and myalgias.  Skin: Positive for color change. Negative for rash.  Neurological: Negative for numbness and headaches.     Physical Exam Updated Vital Signs BP 167/74 (BP Location: Right Arm)   Pulse 111   Temp 98.3 F (36.8 C) (Oral)   Resp 20   Ht '5\' 7"'$  (1.702 m)   Wt 210 lb (95.3 kg)   SpO2 99%   BMI 32.89 kg/m   Physical Exam  Constitutional: She is oriented to person, place, and time. She appears well-developed and well-nourished. No distress.  HENT:  Head: Normocephalic and atraumatic.  Eyes: Conjunctivae and EOM are normal.  Neck: Neck supple. No tracheal deviation present.  Cardiovascular: Tachycardia present.   Pulmonary/Chest: Effort normal. No respiratory distress.  Musculoskeletal: Normal range of motion.  Neurological: She is alert and oriented to person, place,  and time.  Skin: Skin is warm and dry. There is erythema.  Patient with warmth and tenderness of dorsum of left hand with extension to forearm. Decreased ROM and pain with flexion/extension of left wrist and elbow. No abscess palpated.  Psychiatric: She has a normal mood and affect. Her behavior is normal.  Nursing note and vitals reviewed.    ED Treatments / Results   DIAGNOSTIC STUDIES: Oxygen Saturation is 99% on RA, normal by my interpretation.    COORDINATION OF CARE: 6:24 PM-Discussed treatment plan which includes left elbow x-ray with pt at bedside and pt agreed to plan.     Labs (all labs ordered are listed, but only abnormal results are displayed) Labs Reviewed - No data to display  EKG  EKG Interpretation None       Radiology Dg Elbow Complete Left  Result Date: 12/23/2015 CLINICAL DATA:  Left elbow pain for 1 week, no known injury, initial encounter EXAM: LEFT ELBOW - COMPLETE 3+ VIEW COMPARISON:   None. FINDINGS: Degenerative changes are noted in the elbow joint. Elevation of the anterior fat pad is noted which may be related to a joint effusion. No definitive fracture is seen. Curvilinear well corticated density is noted adjacent to the radial head which may be related to prior trauma. IMPRESSION: Degenerative changes and likely chronic posttraumatic changes as described. No acute abnormality is noted aside from small joint effusion anteriorly. Electronically Signed   By: Inez Catalina M.D.   On: 12/23/2015 20:03   Procedures Procedures (including critical care time)  Medications Ordered in ED Medications  morphine 2 MG/ML injection 2 mg (2 mg Intravenous Given 12/23/15 1857)  cefTRIAXone (ROCEPHIN) 1 g in dextrose 5 % 50 mL IVPB (0 g Intravenous Stopped 12/23/15 2034)  ondansetron (ZOFRAN) injection 4 mg (4 mg Intravenous Given 12/23/15 1857)  morphine 4 MG/ML injection 4 mg (4 mg Intravenous Given 12/23/15 2034)     Initial Impression / Assessment and Plan / ED Course  I have reviewed the triage vital signs and the nursing notes.  Pertinent labs & imaging results that were available during my care of the patient were reviewed by me and considered in my medical decision making (see chart for details).  Clinical Course   Patient discussed with and seen by Dr. Gilford Raid. Labs/abx ordered. Will need admission for cellulitis, failure of outpatient treatment with progression of symptoms.   Final Clinical Impressions(s) / ED Diagnoses   Final diagnoses:  Cellulitis of left hand  Cellulitis of left arm   Admit for IV abx, further evaluation.   9:30 PM Spoke with Dr. Hal Hope who [facility] patient to [facility].   New Prescriptions New Prescriptions   No medications on file    I personally performed the services described in this documentation, which was scribed in my presence. The recorded information has been reviewed and is accurate.     Carlisle Cater, PA-C 12/23/15  2124    Carlisle Cater, PA-C 12/23/15 2131    Isla Pence, MD 12/23/15 2136

## 2015-12-23 NOTE — Plan of Care (Signed)
82 year old female presenting with persistent swelling of the right hand, now moving proximally to the elbow. As per the ER physician patient has cellulitis but does not have any signs of abscess to drain. X-ray of the elbow does show some effusion. Patient has been placed on ceftriaxone and admitted for further management of cellulitis. As per the ER physician patient does not look septic.  Mindy Taylor

## 2015-12-23 NOTE — ED Triage Notes (Signed)
Left hand pain that continues up from her thumb to her elbow since last Friday.  Swelling noted.

## 2015-12-24 ENCOUNTER — Observation Stay (HOSPITAL_COMMUNITY): Payer: Medicare Other

## 2015-12-24 ENCOUNTER — Encounter (HOSPITAL_COMMUNITY): Payer: Self-pay | Admitting: Internal Medicine

## 2015-12-24 DIAGNOSIS — I10 Essential (primary) hypertension: Secondary | ICD-10-CM | POA: Diagnosis not present

## 2015-12-24 DIAGNOSIS — L03114 Cellulitis of left upper limb: Secondary | ICD-10-CM | POA: Diagnosis not present

## 2015-12-24 DIAGNOSIS — M1990 Unspecified osteoarthritis, unspecified site: Secondary | ICD-10-CM | POA: Diagnosis present

## 2015-12-24 DIAGNOSIS — E785 Hyperlipidemia, unspecified: Secondary | ICD-10-CM | POA: Diagnosis not present

## 2015-12-24 DIAGNOSIS — Z6832 Body mass index (BMI) 32.0-32.9, adult: Secondary | ICD-10-CM | POA: Diagnosis not present

## 2015-12-24 DIAGNOSIS — Z8673 Personal history of transient ischemic attack (TIA), and cerebral infarction without residual deficits: Secondary | ICD-10-CM | POA: Diagnosis not present

## 2015-12-24 DIAGNOSIS — Z87891 Personal history of nicotine dependence: Secondary | ICD-10-CM | POA: Diagnosis not present

## 2015-12-24 DIAGNOSIS — C9000 Multiple myeloma not having achieved remission: Secondary | ICD-10-CM | POA: Diagnosis present

## 2015-12-24 DIAGNOSIS — D638 Anemia in other chronic diseases classified elsewhere: Secondary | ICD-10-CM | POA: Diagnosis present

## 2015-12-24 DIAGNOSIS — M65822 Other synovitis and tenosynovitis, left upper arm: Secondary | ICD-10-CM | POA: Diagnosis present

## 2015-12-24 DIAGNOSIS — L0390 Cellulitis, unspecified: Secondary | ICD-10-CM | POA: Diagnosis present

## 2015-12-24 DIAGNOSIS — M7989 Other specified soft tissue disorders: Secondary | ICD-10-CM | POA: Diagnosis not present

## 2015-12-24 DIAGNOSIS — E669 Obesity, unspecified: Secondary | ICD-10-CM | POA: Diagnosis present

## 2015-12-24 LAB — CBC WITH DIFFERENTIAL/PLATELET
BASOS ABS: 0 10*3/uL (ref 0.0–0.1)
Basophils Relative: 0 %
EOS PCT: 0 %
Eosinophils Absolute: 0 10*3/uL (ref 0.0–0.7)
HEMATOCRIT: 32.3 % — AB (ref 36.0–46.0)
HEMOGLOBIN: 10.6 g/dL — AB (ref 12.0–15.0)
LYMPHS ABS: 1.2 10*3/uL (ref 0.7–4.0)
LYMPHS PCT: 17 %
MCH: 30.3 pg (ref 26.0–34.0)
MCHC: 32.8 g/dL (ref 30.0–36.0)
MCV: 92.3 fL (ref 78.0–100.0)
Monocytes Absolute: 0.7 10*3/uL (ref 0.1–1.0)
Monocytes Relative: 10 %
NEUTROS ABS: 4.9 10*3/uL (ref 1.7–7.7)
NEUTROS PCT: 73 %
PLATELETS: 190 10*3/uL (ref 150–400)
RBC: 3.5 MIL/uL — AB (ref 3.87–5.11)
RDW: 13.9 % (ref 11.5–15.5)
WBC: 6.8 10*3/uL (ref 4.0–10.5)

## 2015-12-24 LAB — SEDIMENTATION RATE: SED RATE: 115 mm/h — AB (ref 0–22)

## 2015-12-24 LAB — COMPREHENSIVE METABOLIC PANEL
ALT: 9 U/L — ABNORMAL LOW (ref 14–54)
ANION GAP: 8 (ref 5–15)
AST: 14 U/L — ABNORMAL LOW (ref 15–41)
Albumin: 3.2 g/dL — ABNORMAL LOW (ref 3.5–5.0)
Alkaline Phosphatase: 65 U/L (ref 38–126)
BILIRUBIN TOTAL: 1.4 mg/dL — AB (ref 0.3–1.2)
BUN: 16 mg/dL (ref 6–20)
CHLORIDE: 106 mmol/L (ref 101–111)
CO2: 23 mmol/L (ref 22–32)
Calcium: 8.4 mg/dL — ABNORMAL LOW (ref 8.9–10.3)
Creatinine, Ser: 0.85 mg/dL (ref 0.44–1.00)
Glucose, Bld: 91 mg/dL (ref 65–99)
POTASSIUM: 3.8 mmol/L (ref 3.5–5.1)
Sodium: 137 mmol/L (ref 135–145)
TOTAL PROTEIN: 7.4 g/dL (ref 6.5–8.1)

## 2015-12-24 MED ORDER — ENSURE ENLIVE PO LIQD: 237.0000 mL | Freq: Two times a day (BID) | ORAL | Status: DC

## 2015-12-24 MED ORDER — ONDANSETRON HCL 4 MG/2ML IJ SOLN: 4.0000 mg | Freq: Three times a day (TID) | INTRAMUSCULAR | Status: DC | PRN

## 2015-12-24 MED ORDER — ACETAMINOPHEN 650 MG RE SUPP: 650.0000 mg | Freq: Four times a day (QID) | RECTAL | Status: DC | PRN

## 2015-12-24 MED ORDER — ONDANSETRON HCL 4 MG PO TABS: 4.0000 mg | ORAL_TABLET | Freq: Four times a day (QID) | ORAL | Status: DC | PRN

## 2015-12-24 MED ORDER — ONDANSETRON HCL 4 MG/2ML IJ SOLN: 4.0000 mg | Freq: Four times a day (QID) | INTRAMUSCULAR | Status: DC | PRN

## 2015-12-24 MED ORDER — SODIUM CHLORIDE 0.9 % IV SOLN: INTRAVENOUS | Status: DC

## 2015-12-24 MED ORDER — SODIUM CHLORIDE 0.9 % IV SOLN
INTRAVENOUS | Status: AC
  Administered 2015-12-24: 19:00:00 via INTRAVENOUS

## 2015-12-24 MED ORDER — PRAVASTATIN SODIUM 40 MG PO TABS
40.0000 mg | ORAL_TABLET | Freq: Every day | ORAL | Status: DC
  Administered 2015-12-24 – 2015-12-25 (×2): 40 mg via ORAL
  Filled 2015-12-24 (×2): qty 1

## 2015-12-24 MED ORDER — ADULT MULTIVITAMIN W/MINERALS CH
1.0000 | ORAL_TABLET | Freq: Every day | ORAL | Status: DC
  Administered 2015-12-25: 1 via ORAL
  Filled 2015-12-24 (×3): qty 1

## 2015-12-24 MED ORDER — VITAMINS A & D EX OINT
TOPICAL_OINTMENT | CUTANEOUS | Status: AC
  Administered 2015-12-24: 5
  Filled 2015-12-24: qty 5

## 2015-12-24 MED ORDER — VITAMIN C 500 MG PO TABS
500.0000 mg | ORAL_TABLET | Freq: Every day | ORAL | Status: DC
  Administered 2015-12-25: 500 mg via ORAL
  Filled 2015-12-24: qty 1

## 2015-12-24 MED ORDER — ACETAMINOPHEN 325 MG PO TABS
650.0000 mg | ORAL_TABLET | Freq: Four times a day (QID) | ORAL | Status: DC | PRN
  Administered 2015-12-24: 650 mg via ORAL
  Filled 2015-12-24: qty 2

## 2015-12-24 MED ORDER — METOPROLOL SUCCINATE ER 50 MG PO TB24
50.0000 mg | ORAL_TABLET | Freq: Two times a day (BID) | ORAL | Status: DC
  Administered 2015-12-24 – 2015-12-25 (×4): 50 mg via ORAL
  Filled 2015-12-24 (×4): qty 1

## 2015-12-24 MED ORDER — CEFAZOLIN SODIUM-DEXTROSE 2-4 GM/100ML-% IV SOLN
2.0000 g | Freq: Three times a day (TID) | INTRAVENOUS | Status: DC
  Administered 2015-12-24: 2 g via INTRAVENOUS
  Filled 2015-12-24 (×2): qty 100

## 2015-12-24 MED ORDER — VITAMIN D 1000 UNITS PO TABS
1000.0000 [IU] | ORAL_TABLET | Freq: Every day | ORAL | Status: DC
  Administered 2015-12-25: 1000 [IU] via ORAL
  Filled 2015-12-24 (×2): qty 1

## 2015-12-24 MED ORDER — OMEGA-3-ACID ETHYL ESTERS 1 G PO CAPS
1.0000 g | ORAL_CAPSULE | Freq: Every day | ORAL | Status: DC
  Administered 2015-12-25: 1 g via ORAL
  Filled 2015-12-24 (×2): qty 1

## 2015-12-24 MED ORDER — PIPERACILLIN-TAZOBACTAM 3.375 G IVPB
3.3750 g | Freq: Three times a day (TID) | INTRAVENOUS | Status: DC
  Administered 2015-12-24 – 2015-12-25 (×4): 3.375 g via INTRAVENOUS
  Filled 2015-12-24 (×7): qty 50

## 2015-12-24 MED ORDER — LOSARTAN POTASSIUM 50 MG PO TABS
100.0000 mg | ORAL_TABLET | Freq: Every evening | ORAL | Status: DC
  Administered 2015-12-24: 100 mg via ORAL
  Filled 2015-12-24: qty 2

## 2015-12-24 MED ORDER — SODIUM CHLORIDE 0.9 % IV SOLN
1500.0000 mg | INTRAVENOUS | Status: DC
  Administered 2015-12-24 – 2015-12-25 (×2): 1500 mg via INTRAVENOUS
  Filled 2015-12-24 (×2): qty 1500

## 2015-12-24 MED ORDER — HYDROCODONE-ACETAMINOPHEN 5-325 MG PO TABS
0.5000 | ORAL_TABLET | Freq: Four times a day (QID) | ORAL | Status: DC | PRN
  Administered 2015-12-24 – 2015-12-25 (×3): 1 via ORAL
  Filled 2015-12-24 (×3): qty 1

## 2015-12-24 MED ORDER — OMEGA-3 FATTY ACIDS 1000 MG PO CAPS: 1.0000 g | ORAL_CAPSULE | Freq: Every day | ORAL | Status: DC

## 2015-12-24 MED ORDER — AMLODIPINE BESYLATE 5 MG PO TABS
5.0000 mg | ORAL_TABLET | Freq: Every day | ORAL | Status: DC
  Administered 2015-12-24 – 2015-12-25 (×2): 5 mg via ORAL
  Filled 2015-12-24 (×3): qty 1

## 2015-12-24 NOTE — Care Management Note (Signed)
Case Management Note  Patient Details  Name: Mindy Taylor MRN: [id number] Date of Birth: 13-Dec-1928  Subjective/Objective:  82 y/o f admitted w/L arm cellulitis. From home. Transfer to [facility]-surgery.                  Action/Plan:d/c plan home.   Expected Discharge Date:                  Expected Discharge Plan:  Home/Self Care  In-House Referral:     Discharge planning Services  CM Consult  Post Acute Care Choice:    Choice offered to:     DME Arranged:    DME Agency:     HH Arranged:    HH Agency:     Status of Service:  In process, will continue to follow  If discussed at Long Length of Stay Meetings, dates discussed:    Additional Comments:  Dessa Phi, RN 12/24/2015, 12:56 PM

## 2015-12-24 NOTE — Progress Notes (Signed)
Initial Nutrition Assessment  DOCUMENTATION CODES:   Obesity unspecified  INTERVENTION:  -RD to continue to monitor  NUTRITION DIAGNOSIS:   Inadequate oral intake related to inability to eat, poor appetite as evidenced by NPO status, per patient/family report.  GOAL:   Patient will meet greater than or equal to 90% of their needs  MONITOR:   PO intake, Labs, I & O's, Weight trends  REASON FOR ASSESSMENT:   Malnutrition Screening Tool    ASSESSMENT:   Mindy Taylor is a 82 y.o. female with multiple myeloma presently not on treatment on patient's own will, hypertension, hyperlipidemia started experiencing pain in her low back pleuritic upper back pain and left hand pain a week ago after she was carrying some weights. The pain gradually worsened and she came to the ER 3 days ago and chest x-ray and left hand x-ray was unremarkable  Spoke with Mindy Taylor, family at bedside. She endorses poor appetite PTA for approximately 3 days. States that she continued to eat regardless of poor appetite cause she "knows she needs it." She states that she has lost some weight as a result of her poor appetite, but was unsure of how much. Per chart review she exhibits a 6#/2.8% severe wt loss over 3 days. Continues to be NPO. Transferring to [facility] for MRI of LUE. States before onset of illness she was eating great "I love my cornbread, lots of [place] food." She does not want any ONS "I haven't gotten to that point yet."  Labs and Medications reviewed: Total Bilirubin 1.4 Vitamin D, Omega-3s, Multivitamin with Minerals, Vitamin C, NS @ 68m/hr  Diet Order:  Diet NPO time specified Except for: Sips with Meds  Skin:  Reviewed, no issues  Last BM:  PTA  Height:   Ht Readings from Last 1 Encounters:  12/24/15 '5\' 7"'$  (1.702 m)    Weight:   Wt Readings from Last 1 Encounters:  12/24/15 204 lb 1.6 oz (92.6 kg)    Ideal Body Weight:  61.36 kg  BMI:  Body mass index is 31.97  kg/m.  Estimated Nutritional Needs:   Kcal:  1300-1500 calories  Protein:  60-75 grams  Fluid:  >/= 1.3L  EDUCATION NEEDS:   No education needs identified at this time  Mindy Taylor Madisin Hasan, MS, RD LDN Inpatient Clinical Dietitian Pager [phone]

## 2015-12-24 NOTE — Progress Notes (Signed)
[facility]  Patient admitted by my colleague this AM for left hand swelling, presumed cellulitis. MRI shows Tenosynovitis of flexor compartment. I have reviewed the chart and examined her. She is stable for transfer to [facility] to be evaluated by hand surgery. No change in medications ordered on admission has been made.  Debbe Odea, MD

## 2015-12-24 NOTE — Consult Note (Signed)
PT CHART REVIEWED PT SEEN/EXAMINED TIME WAS SPENT-45 MIN EXAMINING PATIENT, REVIEWING MRI AND DISCUSSIONS WITH REQUESTING PHYSICIANS THIS AM COORDINATING HER CARE ON EXAM OF HER LUE HER HAND IS MODERATELY SWOLLEN, NO EVIDENCE OF ABSCESS COLLECTION IN HAND OR FOREARM WIGGLES FINGERS, FINGERS WARM WELL PERFUSED NO FLUCTUANCE OVER FOREARM OR ELBOW HER MRI OF HAND AND FOREARM REVIEWED  A: LUE CELLULITIS AND FLEXOR TENOSYNOVITIS, DOES NOT APPEAR INFECTIOUS  PLAN: PT REPORTS HAND AND FOREARM GETTING BETTER NO EVIDENCE OF INFECTIOUS TENOSYNOVITIS WOULD CONTINUE TO TREAT FOR CELLULITIS CAN CONVERT TO PO ABX SPLINT FOR LEFT WRIST CAN F/U WITH ME AS AN OUTPATIENT PT ASKING ME TO GO HOME CAN SEE IN OFFICE LATER THIS WEEK UNCLEAR ETIOLOGY OF TENOSYNOVITIS PLEASE CONTACT IF ANY QUESTIONS [phone]

## 2015-12-24 NOTE — Progress Notes (Signed)
Report given to Yevonne Aline, RN. Pt currently in MRI. Once MRI compete writer will call care link to transport pt to [facility] 2W room [facility]

## 2015-12-24 NOTE — Care Management Obs Status (Signed)
[place] NOTIFICATION   Patient Details  Name: Mindy Taylor MRN: [id number] Date of Birth: 07-01-1928   Medicare Observation Status Notification Given:  Yes    Dessa Phi, RN 12/24/2015, 12:55 PM

## 2015-12-24 NOTE — Progress Notes (Signed)
Pharmacy Antibiotic Note  Mindy Taylor is a 82 y.o. female admitted on 12/23/2015 with cellulitis.  Patient seen two days ago for cellulitis of left hand with swelling up to elbow.  Patient placed on oral antibiotics.  Presents today with worsening symptoms.  Received Ceftriaxone 1gm IV x 1 at [facility] ED.  Patient transferred to [facility] for admission.  Patient with Pharmacy has been consulted for Vancomycin & Zosyn dosing.  Plan:  Zosyn 3.375gm IV q8h (each dose infused over 4 hrs)  Vancomycin 1500mg  IV q24h  Vancomycin trough goal: 10-15 mcg/ml  Follow renal function  F/U blood cultures  Height: 5\' 7"  (170.2 cm) Weight: 204 lb 1.6 oz (92.6 kg) IBW/kg (Calculated) : 61.6  Temp (24hrs), Avg:99.7 F (37.6 C), Min:98.3 F (36.8 C), Max:100.7 F (38.2 C)   Recent Labs Lab 12/21/15 2040 12/23/15 1855 12/23/15 2000 12/23/15 2354 12/24/15 0336  WBC 7.5 9.6  --   --  6.8  CREATININE 0.65  --  0.94  --  0.85  LATICACIDVEN  --   --   --  0.54  --     Estimated Creatinine Clearance: 56.5 mL/min (by C-G formula based on SCr of 0.85 mg/dL).    Allergies  Allergen Reactions  . Bee Venom Swelling  . Ceftin [Cefuroxime Axetil]     Felt wierd  . Spider Antivenin [Antivenin Latrodectus Mactans] Other (See Comments)    pain    Antimicrobials this admission: 7/30 Ceftriaxone x 1 7/31 Zosyn >>   7/31 Vanc >>  Dose adjustments this admission:    Microbiology results: 7/31 BCx:  sent  Thank you for allowing pharmacy to be a part of this patient's care.  Everette Rank, PharmD 12/24/2015 5:45 AM

## 2015-12-24 NOTE — H&P (Addendum)
History and Physical    Devon Kingdon Pintor ONG:[id number] DOB: 1942/06/13 DOA: 12/23/2015  PCP: Walker Kehr, MD  Patient coming from: Home.  Chief Complaint: Left hand and forearm pain and swelling.  HPI: Mindy Taylor is a 82 y.o. female with multiple myeloma presently not on treatment on patient's own will, hypertension, hyperlipidemia started experiencing pain in her low back pleuritic upper back pain and left hand pain a week ago after she was carrying some weights. The pain gradually worsened and she came to the ER 3 days ago and chest x-ray and left hand x-ray was unremarkable. Patient was discharged home on oral antibiotic despite taking which patients pain and swelling started progressing proximally to the left elbow. Patient on exam has severe tenderness of the left hand and elbow with mild swelling of the elbow area. All movements makes severe pain for the left hand. Patient is being admitted for cellulitis of the left upper extremity. Patient states last 2 days she has not taken her medication secondary to patient not feeling well. Patient at this time denies any chest pain and states her low back pain has resolved. Appears nonfocal.  ED Course: Patient was started on antibiotics.  Review of Systems: As per HPI, rest all negative.   Past Medical History:  Diagnosis Date  . ANXIETY 03/05/2007  . ATAXIA 12/26/2009  . BEE STING 03/05/2007  . BRONCHITIS, ACUTE 06/28/2010  . CANDIDIASIS, VAGINAL 09/08/2008  . CEREBROVASCULAR ACCIDENT, HX OF 01/04/2010  . CERUMEN IMPACTION 06/05/2008  . DIZZINESS 12/26/2009  . Epistaxis 06/05/2008  . Headache(784.0) 06/05/2008  . HYPERLIPIDEMIA 12/08/2006  . HYPERTENSION 12/08/2006  . 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 Multiple myeloma ([place]) 04/03/2015  . NECK PAIN 06/05/2008  . OBESITY 09/07/2009  . ONYCHOMYCOSIS 03/09/2009  . OSTEOARTHRITIS 12/08/2006  . PARESTHESIA 12/26/2009  . TOBACCO USE, QUIT 03/09/2009    Past Surgical History:  Procedure Laterality  Date  . ABDOMINAL HYSTERECTOMY    . BREAST SURGERY       reports that she has quit smoking. She has never used smokeless tobacco. She reports that she does not drink alcohol or use drugs.  Allergies  Allergen Reactions  . Bee Venom Swelling  . Ceftin [Cefuroxime Axetil]     Felt wierd  . Spider Antivenin [Antivenin Latrodectus Mactans] Other (See Comments)    pain    Family History  Problem Relation Age of Onset  . Hypertension Mother   . Hypertension Father   . Hypertension Other     Prior to Admission medications   Medication Sig Start Date End Date Taking? Authorizing Provider  amLODipine (NORVASC) 5 MG tablet TAKE 1 TABLET DAILY 01/11/15  Yes Cassandria Anger, MD  Cholecalciferol (VITAMIN D) 1000 UNITS capsule Take 1 capsule (1,000 Units total) by mouth daily. 01/03/11  Yes Evie Lacks Plotnikov, MD  fish oil-omega-3 fatty acids 1000 MG capsule Take 1 g by mouth daily.     Yes Historical Provider, MD  HYDROcodone-acetaminophen (NORCO/VICODIN) 5-325 MG tablet Take 0.5-1 tablets by mouth every 6 (six) hours as needed for severe pain. 10/12/15  Yes Evie Lacks Plotnikov, MD  ibuprofen (ADVIL) 200 MG tablet Take 1-2 tablets (200-400 mg total) by mouth every 8 (eight) hours as needed for moderate pain. 02/01/15  Yes Evie Lacks Plotnikov, MD  losartan (COZAAR) 100 MG tablet TAKE 1 TABLET EVERY EVENING 10/29/15  Yes Biagio Borg, MD  metoprolol succinate (TOPROL-XL) 50 MG 24 hr tablet TAKE 1 TABLET TWICE A DAY 10/08/15  Yes Cassandria Anger, MD  Multiple Vitamin (MULTIVITAMIN) tablet Take 1 tablet by mouth daily.     Yes Historical Provider, MD  pravastatin (PRAVACHOL) 40 MG tablet TAKE 1 TABLET DAILY 10/15/15  Yes Cassandria Anger, MD  vitamin C (ASCORBIC ACID) 500 MG tablet Take 500 mg by mouth daily.     Yes Historical Provider, MD  oxyCODONE-acetaminophen (PERCOCET/ROXICET) 5-325 MG tablet Take 1 tablet by mouth every 6 (six) hours as needed for severe pain. Patient not taking:  Reported on 12/24/2015 12/21/15   Isla Pence, MD  sulfamethoxazole-trimethoprim (BACTRIM DS,SEPTRA DS) 800-160 MG tablet Take 1 tablet by mouth 2 (two) times daily. Patient not taking: Reported on 12/24/2015 12/21/15 12/28/15  Isla Pence, MD    Physical Exam: Vitals:   12/23/15 2305 12/23/15 2306 12/23/15 2346 12/24/15 0034  BP: 124/62  138/64 (!) 142/71  Pulse:   106 (!) 106  Resp:  '20 20 20  '$ Temp:  100.6 F (38.1 C) 99.4 F (37.4 C) 99.8 F (37.7 C)  TempSrc:  Oral Oral Oral  SpO2:  96% 95% 100%  Weight:    204 lb 1.6 oz (92.6 kg)  Height:    '5\' 7"'$  (1.702 m)      Constitutional: Not in distress. Vitals:   12/23/15 2305 12/23/15 2306 12/23/15 2346 12/24/15 0034  BP: 124/62  138/64 (!) 142/71  Pulse:   106 (!) 106  Resp:  '20 20 20  '$ Temp:  100.6 F (38.1 C) 99.4 F (37.4 C) 99.8 F (37.7 C)  TempSrc:  Oral Oral Oral  SpO2:  96% 95% 100%  Weight:    204 lb 1.6 oz (92.6 kg)  Height:    '5\' 7"'$  (1.702 m)   Eyes: Anicteric no pallor. ENMT: No discharge from the ears eyes nose or mouth. Neck: No mass felt. No JVD appreciated. Respiratory: No rhonchi or crepitations. Cardiovascular: S1-S2 heard. Abdomen: Soft nontender bowel sounds present. Musculoskeletal: Swelling and tenderness of the left hand extending up to the left elbow with severe pain on making any movement. Skin: Mild erythema of the left upper extremity. Neurologic: Alert awake oriented to time place and person. Moves all extremities. Psychiatric: Appears normal.   Labs on Admission: I have personally reviewed following labs and imaging studies  CBC:  Recent Labs Lab 12/21/15 2040 12/23/15 1855  WBC 7.5 9.6  NEUTROABS  --  7.8*  HGB 11.8* 11.5*  HCT 35.8* 34.4*  MCV 93.0 93.0  PLT 215 026   Basic Metabolic Panel:  Recent Labs Lab 12/21/15 2040 12/23/15 2000  NA 136 136  K 3.4* 4.5  CL 100* 99*  CO2 28 23  GLUCOSE 106* 102*  BUN 9 18  CREATININE 0.65 0.94  CALCIUM 8.9 9.3    GFR: Estimated Creatinine Clearance: 51.1 mL/min (by C-G formula based on SCr of 0.94 mg/dL). Liver Function Tests:  Recent Labs Lab 12/21/15 2040  AST 17  ALT 10*  ALKPHOS 83  BILITOT 1.4*  PROT 8.7*  ALBUMIN 3.7   No results for input(s): LIPASE, AMYLASE in the last 168 hours. No results for input(s): AMMONIA in the last 168 hours. Coagulation Profile: No results for input(s): INR, PROTIME in the last 168 hours. Cardiac Enzymes:  Recent Labs Lab 12/21/15 2040  TROPONINI <0.03   BNP (last 3 results) No results for input(s): PROBNP in the last 8760 hours. HbA1C: No results for input(s): HGBA1C in the last 72 hours. CBG: No results for input(s): GLUCAP in the last 168  hours. Lipid Profile: No results for input(s): CHOL, HDL, LDLCALC, TRIG, CHOLHDL, LDLDIRECT in the last 72 hours. Thyroid Function Tests: No results for input(s): TSH, T4TOTAL, FREET4, T3FREE, THYROIDAB in the last 72 hours. Anemia Panel: No results for input(s): VITAMINB12, FOLATE, FERRITIN, TIBC, IRON, RETICCTPCT in the last 72 hours. Urine analysis:    Component Value Date/Time   COLORURINE YELLOW 12/21/2015 2135   APPEARANCEUR CLEAR 12/21/2015 2135   LABSPEC 1.020 12/21/2015 2135   PHURINE 6.0 12/21/2015 2135   GLUCOSEU NEGATIVE 12/21/2015 2135   GLUCOSEU NEGATIVE 09/06/2012 0900   HGBUR MODERATE (A) 12/21/2015 2135   BILIRUBINUR NEGATIVE 12/21/2015 2135   KETONESUR NEGATIVE 12/21/2015 2135   PROTEINUR NEGATIVE 12/21/2015 2135   UROBILINOGEN 0.2 03/14/2015 1300   NITRITE NEGATIVE 12/21/2015 2135   LEUKOCYTESUR SMALL (A) 12/21/2015 2135   Sepsis Labs: '@LABRCNTIP'$ (procalcitonin:4,lacticidven:4) )No results found for this or any previous visit (from the past 240 hour(s)).   Radiological Exams on Admission: Dg Elbow Complete Left  Result Date: 12/23/2015 CLINICAL DATA:  Left elbow pain for 1 week, no known injury, initial encounter EXAM: LEFT ELBOW - COMPLETE 3+ VIEW COMPARISON:  None.  FINDINGS: Degenerative changes are noted in the elbow joint. Elevation of the anterior fat pad is noted which may be related to a joint effusion. No definitive fracture is seen. Curvilinear well corticated density is noted adjacent to the radial head which may be related to prior trauma. IMPRESSION: Degenerative changes and likely chronic posttraumatic changes as described. No acute abnormality is noted aside from small joint effusion anteriorly. Electronically Signed   By: Inez Catalina M.D.   On: 12/23/2015 20:03    Assessment/Plan Principal Problem:   Cellulitis of left upper extremity Active Problems:   Essential hypertension   Cellulitis    1. Cellulitis of the left upper extremity extending from the left hand up to the left elbow - at this time I have placed patient on empiric antibiotics and ordered blood cultures sedimentation rate and MRI of the left hand MRI of the left forearm. We'll get hand surgery consult. 2. Hypertension - continue Cozaar and amlodipine and metoprolol. 3. Hyperlipidemia on statins. 4. Multiple myeloma - patient to see not taking medications at her own will. 5. Anemia - follow CBC.  Addendum - discussed with on-call hand surgeon Dr. Apolonio Schneiders. Dr. Apolonio Schneiders will be seeing patient in consult. Dr. Apolonio Schneiders has requested transfer to [facility] and patient's nurse made aware of the transfer plan. Accepting physician will be Dr. Marily Memos.   DVT prophylaxis: SCDs in anticipation of possible procedure. Code Status: Full code.  Family Communication: No family at the bedside.  Disposition Plan: Home.  Consults called: Hand surgery Dr. Apolonio Schneiders.  Admission status: Admitted for observation. Telemetry.    Rise Patience MD [facility] Pager [phone].  If 7PM-7AM, please contact night-coverage [email or web address] Password [email or web address]  12/24/2015, 2:56 AM

## 2015-12-25 DIAGNOSIS — E785 Hyperlipidemia, unspecified: Secondary | ICD-10-CM

## 2015-12-25 MED ORDER — ACETAMINOPHEN 325 MG PO TABS: 650.0000 mg | ORAL_TABLET | Freq: Four times a day (QID) | ORAL | 0 refills | Status: DC | PRN

## 2015-12-25 MED ORDER — DOXYCYCLINE HYCLATE 100 MG PO TABS: 100.0000 mg | ORAL_TABLET | Freq: Two times a day (BID) | ORAL | 0 refills | Status: DC

## 2015-12-25 NOTE — Discharge Summary (Signed)
Physician Discharge Summary  Mindy Taylor Lady JIR:[id number] DOB: Oct 18, 1928 DOA: 12/23/2015  PCP: Walker Kehr, MD  Admit date: 12/23/2015 Discharge date: 12/25/2015  Time spent: 35 minutes  Recommendations for Outpatient Follow-up:  1. Repeat CBC intermittently to follow Hgb trend 2. Please follow resolution of left hand/forearm swelling and pain   Discharge Diagnoses:  Cellulitis of left upper extremity Essential hypertension HLD (hyperlipidemia) Obesity Multiple Myeloma Anemia of chronic disease   Discharge Condition: stable and improved. Discharge home with instructions to follow up with hand surgery service in 1 week.  Diet recommendation: heart healthy diet   Filed Weights   12/24/15 0034 12/24/15 1726 12/25/15 0420  Weight: 92.6 kg (204 lb 1.6 oz) 93.4 kg (205 lb 14.6 oz) 92.1 kg (203 lb)    History of present illness:  As per Dr. Hal Hope on 12/23/15 82 y.o. female with multiple myeloma presently not on treatment on patient's own will, hypertension, hyperlipidemia started experiencing pain in her low back pleuritic upper back pain and left hand pain a week ago after she was carrying some weights. The pain gradually worsened and she came to the ER 3 days ago and chest x-ray and left hand x-ray was unremarkable. Patient was discharged home on oral antibiotic despite taking which patients pain and swelling started progressing proximally to the left elbow. Patient on exam has severe tenderness of the left hand and elbow with mild swelling of the elbow area. All movements makes severe pain for the left hand. Patient is being admitted for cellulitis of the left upper extremity.   Hospital Course:  1-cellulitis of left hand/forearm: -improving -w/o signs of tenosynovitis -per hand surgery recommendations will transition to PO antibiotics and use short splint -patient will follow up with Dr. Apolonio Schneiders in 1 week -advise to use PRN advil and tylenol for pain  -non-toxic appearance at  discharge   2-HTN: stable and well controlled  -will continue amlodipine, cozaar and metoprolol -advise to follow heart healthy diet  3-obesity: -Body mass index is 31.79 kg/m. -low calorie diet recommended  4-HLD: -continue statins   5-hx of MM: patient has decided not to take any medications or pursuit further treatment   6-anemia of chronic disease: -no signs of acute bleeding -recommend intermittent outpatient monitoring of Hgb trend   Procedures:  See below for x-ray reports   Consultations:  Hand surgery Gavin Pound)  Discharge Exam: Vitals:   12/25/15 0420 12/25/15 1034  BP: (!) 156/63 116/61  Pulse: 83 94  Resp: 18   Temp: 98.3 F (36.8 C)     General: afebrile, no CP, no SOB. Patient with some improvement in swelling and pain of her left hand; even still bothering her. Cardiovascular: S1 and S2, no rubs, no gallops  Respiratory: CTA bilaterally Extremities: swelling and pain with movement on her left wrist/forearm area; no marked erythema appreciated. Rest of joints were normal and w/o deformities Abd: obese, soft, NT, ND, positive BS  Discharge Instructions   Discharge Instructions    Diet - low sodium heart healthy    Complete by:  As directed   Discharge instructions    Complete by:  As directed   Keep left upper extremity elevated as much as possible Take medications as prescribed Maintain adequate hydration Follow up with hand surgeon in 1 week (contact office for appointment details)   Increase activity slowly    Complete by:  As directed     Current Discharge Medication List    START taking these medications  Details  acetaminophen (TYLENOL) 325 MG tablet Take 2 tablets (650 mg total) by mouth every 6 (six) hours as needed for mild pain (or Fever >/= 101). Qty: 40 tablet, Refills: 0    doxycycline (VIBRA-TABS) 100 MG tablet Take 1 tablet (100 mg total) by mouth 2 (two) times daily. Qty: 20 tablet, Refills: 0      CONTINUE these  medications which have NOT CHANGED   Details  amLODipine (NORVASC) 5 MG tablet TAKE 1 TABLET DAILY Qty: 90 tablet, Refills: 3    Cholecalciferol (VITAMIN D) 1000 UNITS capsule Take 1 capsule (1,000 Units total) by mouth daily. Qty: 100 capsule, Refills: 3    fish oil-omega-3 fatty acids 1000 MG capsule Take 1 g by mouth daily.      HYDROcodone-acetaminophen (NORCO/VICODIN) 5-325 MG tablet Take 0.5-1 tablets by mouth every 6 (six) hours as needed for severe pain. Qty: 90 tablet, Refills: 0    ibuprofen (ADVIL) 200 MG tablet Take 1-2 tablets (200-400 mg total) by mouth every 8 (eight) hours as needed for moderate pain. Qty: 30 tablet, Refills: 0    losartan (COZAAR) 100 MG tablet TAKE 1 TABLET EVERY EVENING Qty: 90 tablet, Refills: 2    metoprolol succinate (TOPROL-XL) 50 MG 24 hr tablet TAKE 1 TABLET TWICE A DAY Qty: 180 tablet, Refills: 2    Multiple Vitamin (MULTIVITAMIN) tablet Take 1 tablet by mouth daily.      pravastatin (PRAVACHOL) 40 MG tablet TAKE 1 TABLET DAILY Qty: 90 tablet, Refills: 2    vitamin C (ASCORBIC ACID) 500 MG tablet Take 500 mg by mouth daily.        STOP taking these medications     oxyCODONE-acetaminophen (PERCOCET/ROXICET) 5-325 MG tablet      sulfamethoxazole-trimethoprim (BACTRIM DS,SEPTRA DS) 800-160 MG tablet        Allergies  Allergen Reactions  . Bee Venom Swelling  . Ceftin [Cefuroxime Axetil]     Felt wierd  . Spider Antivenin [Antivenin Latrodectus Mactans] Other (See Comments)    pain   Follow-up Information    Linna Hoff, MD. Schedule an appointment as soon as possible for a visit in 1 week(s).   Specialty:  Orthopedic Surgery Why:  call office for appoinmtnet  Contact information: [address] [place] [address] [place] [address] [phone]           The results of significant diagnostics from this hospitalization (including imaging, microbiology, ancillary and laboratory) are listed below for reference.     Significant Diagnostic Studies: Dg Chest 2 View  Result Date: 12/21/2015 CLINICAL DATA:  Chest pain.  Pain with breathing. EXAM: CHEST  2 VIEW COMPARISON:  Chest CT 03/12/2014.  Two-view chest x-ray 03/10/2014. FINDINGS: Although the heart size is normal. Mild interstitial coarsening is chronic. No focal airspace disease is present. There is no edema or effusion to suggest failure. Multilevel degenerative changes of the cervical spine are similar to the prior study. There is some exaggeration of the thoracic kyphosis. IMPRESSION: 1. No acute cardiopulmonary disease or significant interval change. Electronically Signed   By: San Morelle M.D.   On: 12/21/2015 20:40  Dg Elbow Complete Left  Result Date: 12/23/2015 CLINICAL DATA:  Left elbow pain for 1 week, no known injury, initial encounter EXAM: LEFT ELBOW - COMPLETE 3+ VIEW COMPARISON:  None. FINDINGS: Degenerative changes are noted in the elbow joint. Elevation of the anterior fat pad is noted which may be related to a joint effusion. No definitive fracture is seen. Curvilinear well  corticated density is noted adjacent to the radial head which may be related to prior trauma. IMPRESSION: Degenerative changes and likely chronic posttraumatic changes as described. No acute abnormality is noted aside from small joint effusion anteriorly. Electronically Signed   By: Inez Catalina M.D.   On: 12/23/2015 20:03  Mr Hand Left Wo Contrast  Result Date: 12/24/2015 CLINICAL DATA:  Pain and swelling in the left hand. Injured hand on Sunday. EXAM: MRI OF THE LEFT HAND WITHOUT CONTRAST TECHNIQUE: Multiplanar, multisequence MR imaging was performed. No intravenous contrast was administered. COMPARISON:  Radiographs 12/21/2015 FINDINGS: Diffuse subcutaneous soft tissue swelling/edema/fluid which could suggest cellulitis. Mild diffuse myositis. Fairly extensive tenosynovitis involving the flexor compartment. Advanced degenerative changes involving the  carpometacarpal joint of the thumb. Mild degenerative changes involving the other joints of the hand. No obvious erosive findings. IMPRESSION: Diffuse subcutaneous soft tissue swelling/ edema/fluid. This could represent cellulitis. Mild patchy myositis and flexor compartment tenosynovitis. No findings for septic arthritis, osteomyelitis or acute fracture. Electronically Signed   By: Marijo Sanes M.D.   On: 12/24/2015 15:38   Mr Forearm Left W/o Cm  Result Date: 12/24/2015 CLINICAL DATA:  Left elbow and forearm swelling since Sunday. Injured arm. EXAM: MRI OF THE LEFT FOREARM WITHOUT CONTRAST TECHNIQUE: Multiplanar, multisequence MR imaging was performed. No intravenous contrast was administered. COMPARISON:  Radiographs 12/23/2015 FINDINGS: Diffuse subcutaneous soft tissue swelling/edema involving the forearm without discrete fluid collection/abscess. This is most significant distally and into the hand. Flexor compartment tenosynovitis is noted at the wrist. There is a large elbow joint effusion, moderate degenerative changes and what appear to be loose bodies. There appears to be remote trauma involving the radial head likely from a prior fracture. Moderate edema like signal abnormality and fluid and an around the flexor pollicis longus muscle and tendon. Suspect a partial muscle tear. IMPRESSION: 1. Large elbow joint effusion and loose bodies. 2. Elbow joint degenerative changes and probable posttraumatic changes from a radial head fracture. 3. Nonspecific subcutaneous soft tissue swelling/edema/fluid mainly in the distal forearm and into the wrist. 4. Suspect date muscle injury involving the flexor pollicis longus muscle with edema and fluid in the muscle and tracking down along the tendon. 5. Flexor compartment tenosynovitis at the wrist. Electronically Signed   By: Marijo Sanes M.D.   On: 12/24/2015 16:15   Dg Hand Complete Left  Result Date: 12/21/2015 CLINICAL DATA:  Pt states that she jammed her  thumb lifting water x 3 days ago, pt co pain and swelling in thumb, increased pain with movement EXAM: LEFT HAND - COMPLETE 3+ VIEW COMPARISON:  None. FINDINGS: No fracture or dislocation. There are arthropathic changes. At the trapezium first metacarpal articulation, there is joint space narrowing and marginal osteophytes. There is more significant joint space narrowing at the first metacarpal phalangeal joint. There is mild narrowing, asymmetric, multiple interphalangeal joints, most evident involving the IP joint of the thumb. There is more significant narrowing of the third metacarpal phalangeal joint with mild anterior subluxation. The bones are diffusely demineralized. Soft tissues are unremarkable. IMPRESSION: 1. No convincing fracture.  No dislocation. 2. Arthropathic/degenerative changes as detailed. Electronically Signed   By: Lajean Manes M.D.   On: 12/21/2015 20:42  Labs: Basic Metabolic Panel:  Recent Labs Lab 12/21/15 2040 12/23/15 2000 12/24/15 0336  NA 136 136 137  K 3.4* 4.5 3.8  CL 100* 99* 106  CO2 _0 GLUCOSE 106* 102* 91  BUN _1 CREATININE 0.65 0.94  0.85  CALCIUM 8.9 9.3 8.4*   Liver Function Tests:  Recent Labs Lab 12/21/15 2040 12/24/15 0336  AST 17 14*  ALT 10* 9*  ALKPHOS 83 65  BILITOT 1.4* 1.4*  PROT 8.7* 7.4  ALBUMIN 3.7 3.2*   CBC:  Recent Labs Lab 12/21/15 2040 12/23/15 1855 12/24/15 0336  WBC 7.5 9.6 6.8  NEUTROABS  --  7.8* 4.9  HGB 11.8* 11.5* 10.6*  HCT 35.8* 34.4* 32.3*  MCV 93.0 93.0 92.3  PLT 215 240 190   Cardiac Enzymes:  Recent Labs Lab 12/21/15 2040  TROPONINI <0.03   BNP: BNP (last 3 results)  Recent Labs  12/21/15 2040  BNP 31.3    Signed:  Barton Dubois MD.  [facility] 12/25/2015, 12:25 PM

## 2015-12-25 NOTE — Progress Notes (Signed)
Orthopedic Tech Progress Note Patient Details:  Mindy Taylor [birth year] [id number]  Ortho Devices Type of Ortho Device: Velcro wrist splint Ortho Device/Splint Location: lue Ortho Device/Splint Interventions: Application   Mindy Taylor 12/25/2015, 1:30 PM

## 2015-12-26 ENCOUNTER — Telehealth: Payer: Self-pay | Admitting: *Deleted

## 2015-12-26 NOTE — Telephone Encounter (Signed)
Pt was on TCM list admited for cellulitis of left hand/forearm. Pt was d/c 8/1, and will f/u w/Dr. Caralyn Guile in 1 week...Johny Chess

## 2015-12-29 LAB — CULTURE, BLOOD (ROUTINE X 2)
CULTURE: NO GROWTH
Culture: NO GROWTH

## 2016-01-01 ENCOUNTER — Telehealth: Payer: Self-pay

## 2016-01-01 NOTE — Telephone Encounter (Signed)
Received call from pt requesting her appts be cancelled since she is no longer receiving treatment. Questioned if pt wanted to wait until closer to time for appt in the event that sx management be required. Pt states her PCP is "handling my bone pain." Aware to contact our office prn. Dr Marin Olp notified. dph

## 2016-01-06 ENCOUNTER — Other Ambulatory Visit: Payer: Self-pay | Admitting: Internal Medicine

## 2016-02-13 DIAGNOSIS — M170 Bilateral primary osteoarthritis of knee: Secondary | ICD-10-CM | POA: Diagnosis not present

## 2016-03-10 ENCOUNTER — Ambulatory Visit: Payer: Medicare Other | Admitting: Hematology & Oncology

## 2016-03-10 ENCOUNTER — Other Ambulatory Visit: Payer: Medicare Other

## 2016-03-21 ENCOUNTER — Encounter: Payer: Self-pay | Admitting: Internal Medicine

## 2016-03-21 ENCOUNTER — Ambulatory Visit (INDEPENDENT_AMBULATORY_CARE_PROVIDER_SITE_OTHER): Payer: Medicare Other | Admitting: Internal Medicine

## 2016-03-21 ENCOUNTER — Other Ambulatory Visit (INDEPENDENT_AMBULATORY_CARE_PROVIDER_SITE_OTHER): Payer: Medicare Other

## 2016-03-21 VITALS — BP 160/90 | HR 82 | Temp 98.2°F | Wt 214.0 lb

## 2016-03-21 DIAGNOSIS — R279 Unspecified lack of coordination: Secondary | ICD-10-CM | POA: Diagnosis not present

## 2016-03-21 DIAGNOSIS — C9000 Multiple myeloma not having achieved remission: Secondary | ICD-10-CM | POA: Diagnosis not present

## 2016-03-21 DIAGNOSIS — I63519 Cerebral infarction due to unspecified occlusion or stenosis of unspecified middle cerebral artery: Secondary | ICD-10-CM

## 2016-03-21 DIAGNOSIS — D649 Anemia, unspecified: Secondary | ICD-10-CM

## 2016-03-21 DIAGNOSIS — Z23 Encounter for immunization: Secondary | ICD-10-CM

## 2016-03-21 DIAGNOSIS — I10 Essential (primary) hypertension: Secondary | ICD-10-CM

## 2016-03-21 LAB — HEPATIC FUNCTION PANEL
ALBUMIN: 4.1 g/dL (ref 3.5–5.2)
ALK PHOS: 76 U/L (ref 39–117)
ALT: 15 U/L (ref 0–35)
AST: 18 U/L (ref 0–37)
BILIRUBIN DIRECT: 0.2 mg/dL (ref 0.0–0.3)
TOTAL PROTEIN: 8.2 g/dL (ref 6.0–8.3)
Total Bilirubin: 0.9 mg/dL (ref 0.2–1.2)

## 2016-03-21 LAB — CBC WITH DIFFERENTIAL/PLATELET
BASOS ABS: 0 10*3/uL (ref 0.0–0.1)
BASOS PCT: 0.3 % (ref 0.0–3.0)
Eosinophils Absolute: 0.1 10*3/uL (ref 0.0–0.7)
Eosinophils Relative: 1.1 % (ref 0.0–5.0)
HEMATOCRIT: 34.9 % — AB (ref 36.0–46.0)
HEMOGLOBIN: 11.6 g/dL — AB (ref 12.0–15.0)
LYMPHS PCT: 26.4 % (ref 12.0–46.0)
Lymphs Abs: 1.7 10*3/uL (ref 0.7–4.0)
MCHC: 33.3 g/dL (ref 30.0–36.0)
MCV: 92.4 fl (ref 78.0–100.0)
MONOS PCT: 6.5 % (ref 3.0–12.0)
Monocytes Absolute: 0.4 10*3/uL (ref 0.1–1.0)
NEUTROS ABS: 4.2 10*3/uL (ref 1.4–7.7)
Neutrophils Relative %: 65.7 % (ref 43.0–77.0)
PLATELETS: 246 10*3/uL (ref 150.0–400.0)
RBC: 3.77 Mil/uL — ABNORMAL LOW (ref 3.87–5.11)
RDW: 16.7 % — ABNORMAL HIGH (ref 11.5–15.5)
WBC: 6.4 10*3/uL (ref 4.0–10.5)

## 2016-03-21 LAB — BASIC METABOLIC PANEL
BUN: 14 mg/dL (ref 6–23)
CHLORIDE: 104 meq/L (ref 96–112)
CO2: 31 meq/L (ref 19–32)
Calcium: 9.6 mg/dL (ref 8.4–10.5)
Creatinine, Ser: 0.77 mg/dL (ref 0.40–1.20)
GFR: 91.45 mL/min (ref >60.00)
Glucose, Bld: 94 mg/dL (ref 70–99)
Potassium: 4.1 mEq/L (ref 3.5–5.1)
SODIUM: 139 meq/L (ref 135–145)

## 2016-03-21 LAB — SEDIMENTATION RATE: SED RATE: 78 mm/h — AB (ref 0–30)

## 2016-03-21 NOTE — Assessment & Plan Note (Signed)
On Losartan, Norvasc, Toprol, ASA No relapse

## 2016-03-21 NOTE — Patient Instructions (Signed)
Wt Readings from Last 3 Encounters:  03/21/16 214 lb (97.1 kg)  12/25/15 203 lb (92.1 kg)  12/21/15 210 lb (95.3 kg)

## 2016-03-21 NOTE — Assessment & Plan Note (Signed)
CBC

## 2016-03-21 NOTE — Assessment & Plan Note (Signed)
Better  

## 2016-03-21 NOTE — Progress Notes (Signed)
Subjective:  Patient ID: Mindy Taylor, female    DOB: 04/03/1929  Age: 82 y.o. MRN: [id number]  CC: No chief complaint on file.   HPI Jaclyn L Russey presents for HTN, dyslipidemia, OA f/u. C/o blurred vision - seeing Dr Bing Plume. F/u MM - the pt stopped Rx and stopped seeing dr Marin Olp  Outpatient Medications Prior to Visit  Medication Sig Dispense Refill  . acetaminophen (TYLENOL) 325 MG tablet Take 2 tablets (650 mg total) by mouth every 6 (six) hours as needed for mild pain (or Fever >/= 101). 40 tablet 0  . amLODipine (NORVASC) 5 MG tablet TAKE 1 TABLET DAILY 90 tablet 2  . Cholecalciferol (VITAMIN D) 1000 UNITS capsule Take 1 capsule (1,000 Units total) by mouth daily. 100 capsule 3  . doxycycline (VIBRA-TABS) 100 MG tablet Take 1 tablet (100 mg total) by mouth 2 (two) times daily. 20 tablet 0  . fish oil-omega-3 fatty acids 1000 MG capsule Take 1 g by mouth daily.      [name] HYDROcodone-acetaminophen (NORCO/VICODIN) 5-325 MG tablet Take 0.5-1 tablets by mouth every 6 (six) hours as needed for severe pain. 90 tablet 0  . ibuprofen (ADVIL) 200 MG tablet Take 1-2 tablets (200-400 mg total) by mouth every 8 (eight) hours as needed for moderate pain. 30 tablet 0  . losartan (COZAAR) 100 MG tablet TAKE 1 TABLET EVERY EVENING 90 tablet 2  . metoprolol succinate (TOPROL-XL) 50 MG 24 hr tablet TAKE 1 TABLET TWICE A DAY 180 tablet 2  . Multiple Vitamin (MULTIVITAMIN) tablet Take 1 tablet by mouth daily.      . pravastatin (PRAVACHOL) 40 MG tablet TAKE 1 TABLET DAILY 90 tablet 2  . vitamin C (ASCORBIC ACID) 500 MG tablet Take 500 mg by mouth daily.       No facility-administered medications prior to visit.     ROS Review of Systems  Constitutional: Negative for activity change, appetite change, chills, fatigue and unexpected weight change.  HENT: Negative for congestion, mouth sores and sinus pressure.   Eyes: Positive for visual disturbance.  Respiratory: Negative for cough and chest  tightness.   Gastrointestinal: Negative for abdominal pain and nausea.  Genitourinary: Negative for difficulty urinating, frequency and vaginal pain.  Musculoskeletal: Positive for arthralgias and back pain. Negative for gait problem.  Skin: Negative for pallor and rash.  Neurological: Negative for dizziness, tremors, weakness, numbness and headaches.  Psychiatric/Behavioral: Negative for confusion and sleep disturbance.    Objective:  BP (!) 160/90   Pulse 82   Temp 98.2 F (36.8 C) (Oral)   Wt 214 lb (97.1 kg)   SpO2 95%   BMI 33.52 kg/m   BP Readings from Last 3 Encounters:  03/21/16 (!) 160/90  12/25/15 129/68  12/21/15 150/73    Wt Readings from Last 3 Encounters:  03/21/16 214 lb (97.1 kg)  12/25/15 203 lb (92.1 kg)  12/21/15 210 lb (95.3 kg)    Physical Exam  Constitutional: She appears well-developed. No distress.  HENT:  Head: Normocephalic.  Right Ear: External ear normal.  Left Ear: External ear normal.  Nose: Nose normal.  Mouth/Throat: Oropharynx is clear and moist.  Eyes: Conjunctivae are normal. Pupils are equal, round, and reactive to light. Right eye exhibits no discharge. Left eye exhibits no discharge.  Neck: Normal range of motion. Neck supple. No JVD present. No tracheal deviation present. No thyromegaly present.  Cardiovascular: Normal rate, regular rhythm and normal heart sounds.   Pulmonary/Chest: No stridor. No respiratory  distress. She has no wheezes.  Abdominal: Soft. Bowel sounds are normal. She exhibits no distension and no mass. There is no tenderness. There is no rebound and no guarding.  Musculoskeletal: She exhibits tenderness. She exhibits no edema.  Lymphadenopathy:    She has no cervical adenopathy.  Neurological: She displays normal reflexes. No cranial nerve deficit. She exhibits normal muscle tone. Coordination normal.  Skin: No rash noted. No erythema.  Psychiatric: She has a normal mood and affect. Her behavior is normal.  Judgment and thought content normal.    Lab Results  Component Value Date   WBC 6.8 12/24/2015   HGB 10.6 (L) 12/24/2015   HCT 32.3 (L) 12/24/2015   PLT 190 12/24/2015   GLUCOSE 91 12/24/2015   CHOL 129 09/06/2012   TRIG 63.0 09/06/2012   HDL 33.90 (L) 09/06/2012   LDLCALC 83 09/06/2012   ALT 9 (L) 12/24/2015   AST 14 (L) 12/24/2015   NA 137 12/24/2015   K 3.8 12/24/2015   CL 106 12/24/2015   CREATININE 0.85 12/24/2015   BUN 16 12/24/2015   CO2 23 12/24/2015   TSH 1.074 10/08/2015   INR 1.04 03/16/2015   HGBA1C 5.7 08/11/2011    Dg Elbow Complete Left  Result Date: 12/23/2015 CLINICAL DATA:  Left elbow pain for 1 week, no known injury, initial encounter EXAM: LEFT ELBOW - COMPLETE 3+ VIEW COMPARISON:  None. FINDINGS: Degenerative changes are noted in the elbow joint. Elevation of the anterior fat pad is noted which may be related to a joint effusion. No definitive fracture is seen. Curvilinear well corticated density is noted adjacent to the radial head which may be related to prior trauma. IMPRESSION: Degenerative changes and likely chronic posttraumatic changes as described. No acute abnormality is noted aside from small joint effusion anteriorly. Electronically Signed   By: Inez Catalina M.D.   On: 12/23/2015 20:03  Mr Hand Left Wo Contrast  Result Date: 12/24/2015 CLINICAL DATA:  Pain and swelling in the left hand. Injured hand on Sunday. EXAM: MRI OF THE LEFT HAND WITHOUT CONTRAST TECHNIQUE: Multiplanar, multisequence MR imaging was performed. No intravenous contrast was administered. COMPARISON:  Radiographs 12/21/2015 FINDINGS: Diffuse subcutaneous soft tissue swelling/edema/fluid which could suggest cellulitis. Mild diffuse myositis. Fairly extensive tenosynovitis involving the flexor compartment. Advanced degenerative changes involving the carpometacarpal joint of the thumb. Mild degenerative changes involving the other joints of the hand. No obvious erosive findings.  IMPRESSION: Diffuse subcutaneous soft tissue swelling/ edema/fluid. This could represent cellulitis. Mild patchy myositis and flexor compartment tenosynovitis. No findings for septic arthritis, osteomyelitis or acute fracture. Electronically Signed   By: Marijo Sanes M.D.   On: 12/24/2015 15:38   Mr Forearm Left W/o Cm  Result Date: 12/24/2015 CLINICAL DATA:  Left elbow and forearm swelling since Sunday. Injured arm. EXAM: MRI OF THE LEFT FOREARM WITHOUT CONTRAST TECHNIQUE: Multiplanar, multisequence MR imaging was performed. No intravenous contrast was administered. COMPARISON:  Radiographs 12/23/2015 FINDINGS: Diffuse subcutaneous soft tissue swelling/edema involving the forearm without discrete fluid collection/abscess. This is most significant distally and into the hand. Flexor compartment tenosynovitis is noted at the wrist. There is a large elbow joint effusion, moderate degenerative changes and what appear to be loose bodies. There appears to be remote trauma involving the radial head likely from a prior fracture. Moderate edema like signal abnormality and fluid and an around the flexor pollicis longus muscle and tendon. Suspect a partial muscle tear. IMPRESSION: 1. Large elbow joint effusion and loose bodies. 2. Elbow  joint degenerative changes and probable posttraumatic changes from a radial head fracture. 3. Nonspecific subcutaneous soft tissue swelling/edema/fluid mainly in the distal forearm and into the wrist. 4. Suspect date muscle injury involving the flexor pollicis longus muscle with edema and fluid in the muscle and tracking down along the tendon. 5. Flexor compartment tenosynovitis at the wrist. Electronically Signed   By: Marijo Sanes M.D.   On: 12/24/2015 16:15    Assessment & Plan:   There are no diagnoses linked to this encounter. I am having Ms. Navarrette maintain her Vitamin D, multivitamin, vitamin C, fish oil-omega-3 fatty acids, ibuprofen, metoprolol succinate,  HYDROcodone-acetaminophen, pravastatin, losartan, acetaminophen, doxycycline, and amLODipine.  No orders of the defined types were placed in this encounter.    Follow-up: No Follow-up on file.  Walker Kehr, MD

## 2016-03-21 NOTE — Assessment & Plan Note (Signed)
On Metoprolol, Amlodipine, Losartan Did not take her BP meds this am yet - take meds

## 2016-03-21 NOTE — Progress Notes (Signed)
Pre visit review using our clinic review tool, if applicable. No additional management support is needed unless otherwise documented below in the visit note. 

## 2016-03-21 NOTE — Assessment & Plan Note (Addendum)
the pt stopped Rx and stopped seeing dr Marin Olp. She understands risks

## 2016-03-24 LAB — TSH: TSH: 0.58 u[IU]/mL (ref 0.35–4.50)

## 2016-03-25 LAB — PROTEIN ELECTROPHORESIS, SERUM
ALPHA-1-GLOBULIN: 0.3 g/dL (ref 0.2–0.3)
Abnormal Protein Band1: 1.4 g/dL
Albumin ELP: 3.9 g/dL (ref 3.8–4.8)
Alpha-2-Globulin: 1 g/dL — ABNORMAL HIGH (ref 0.5–0.9)
BETA 2: 0.3 g/dL (ref 0.2–0.5)
Beta Globulin: 0.5 g/dL (ref 0.4–0.6)
GAMMA GLOBULIN: 1.8 g/dL — AB (ref 0.8–1.7)
TOTAL PROTEIN, SERUM ELECTROPHOR: 7.8 g/dL (ref 6.1–8.1)

## 2016-04-03 DIAGNOSIS — H40013 Open angle with borderline findings, low risk, bilateral: Secondary | ICD-10-CM | POA: Diagnosis not present

## 2016-04-03 DIAGNOSIS — H43392 Other vitreous opacities, left eye: Secondary | ICD-10-CM | POA: Diagnosis not present

## 2016-04-03 DIAGNOSIS — H524 Presbyopia: Secondary | ICD-10-CM | POA: Diagnosis not present

## 2016-04-03 DIAGNOSIS — H2513 Age-related nuclear cataract, bilateral: Secondary | ICD-10-CM | POA: Diagnosis not present

## 2016-04-03 DIAGNOSIS — H5203 Hypermetropia, bilateral: Secondary | ICD-10-CM | POA: Diagnosis not present

## 2016-04-03 DIAGNOSIS — H52223 Regular astigmatism, bilateral: Secondary | ICD-10-CM | POA: Diagnosis not present

## 2016-04-03 DIAGNOSIS — H04123 Dry eye syndrome of bilateral lacrimal glands: Secondary | ICD-10-CM | POA: Diagnosis not present

## 2016-05-22 ENCOUNTER — Ambulatory Visit (INDEPENDENT_AMBULATORY_CARE_PROVIDER_SITE_OTHER): Payer: Medicare Other | Admitting: Internal Medicine

## 2016-05-22 ENCOUNTER — Encounter: Payer: Self-pay | Admitting: Internal Medicine

## 2016-05-22 ENCOUNTER — Ambulatory Visit (INDEPENDENT_AMBULATORY_CARE_PROVIDER_SITE_OTHER)
Admission: RE | Admit: 2016-05-22 | Discharge: 2016-05-22 | Disposition: A | Discharge status: Home / Self Care | Payer: Medicare Other | Source: Ambulatory Visit | Attending: Internal Medicine | Admitting: Internal Medicine

## 2016-05-22 DIAGNOSIS — R059 Cough, unspecified: Secondary | ICD-10-CM | POA: Insufficient documentation

## 2016-05-22 DIAGNOSIS — I63519 Cerebral infarction due to unspecified occlusion or stenosis of unspecified middle cerebral artery: Secondary | ICD-10-CM | POA: Diagnosis not present

## 2016-05-22 DIAGNOSIS — F411 Generalized anxiety disorder: Secondary | ICD-10-CM | POA: Diagnosis not present

## 2016-05-22 DIAGNOSIS — R05 Cough: Secondary | ICD-10-CM | POA: Diagnosis not present

## 2016-05-22 DIAGNOSIS — I10 Essential (primary) hypertension: Secondary | ICD-10-CM

## 2016-05-22 MED ORDER — LEVOFLOXACIN 500 MG PO TABS: 500.0000 mg | ORAL_TABLET | Freq: Every day | ORAL | 0 refills | Status: AC

## 2016-05-22 MED ORDER — HYDROCODONE-HOMATROPINE 5-1.5 MG/5ML PO SYRP: 5.0000 mL | ORAL_SOLUTION | Freq: Four times a day (QID) | ORAL | 0 refills | Status: AC | PRN

## 2016-05-22 NOTE — Assessment & Plan Note (Addendum)
/  Mild to mod, c/w bronchtiis vs pna, for antibx course, cough medicine, and cxr,  to f/u any worsening symptoms or concerns, hold on steroid tx, no evidence for wheezing at this time

## 2016-05-22 NOTE — Patient Instructions (Addendum)
Please take all new medication as prescribed - the antibiotic, and cough medicine if needed  Please continue all other medications as before, and refills have been done if requested.  Please have the pharmacy call with any other refills you may need.  Please continue your efforts at being more active, low cholesterol diet, and weight control..  Please keep your appointments with your specialists as you may have planned  Please go to the XRAY Department in the Basement (go straight as you get off the elevator) for the x-ray testing  You will be contacted by phone if any changes need to be made immediately.  Otherwise, you will receive a letter about your results with an explanation, but please check with MyChart first.  Please remember to sign up for MyChart if you have not done so, as this will be important to you in the future with finding out test results, communicating by private email, and scheduling acute appointments online when needed.

## 2016-05-22 NOTE — Assessment & Plan Note (Signed)
Mild situational worsening, ok to cont same tx, reassured

## 2016-05-22 NOTE — Progress Notes (Signed)
Subjective:    Patient ID: Mindy Taylor, female    DOB: 1929-04-15, 82 y.o.   MRN: [id number]  HPI  Here with acute onset mild to mod 2-3 days ST, HA, general weakness and malaise, with prod cough greenish sputum tinged with blood this am, but Pt denies chest pain, increased sob or doe, wheezing, orthopnea, PND, increased LE swelling, palpitations, dizziness or syncope.  Has hx of Mult myeloma.  Pt denies new neurological symptoms such as new headache, or facial or extremity weakness or numbness   Pt denies polydipsia, polyuria  Denies worsening depressive symptoms, suicidal ideation, or panic; has ongoing anxiety Past Medical History:  Diagnosis Date  . ANXIETY 03/05/2007  . ATAXIA 12/26/2009  . BEE STING 03/05/2007  . BRONCHITIS, ACUTE 06/28/2010  . CANDIDIASIS, VAGINAL 09/08/2008  . CEREBROVASCULAR ACCIDENT, HX OF 01/04/2010  . CERUMEN IMPACTION 06/05/2008  . DIZZINESS 12/26/2009  . Epistaxis 06/05/2008  . Headache(784.0) 06/05/2008  . HYPERLIPIDEMIA 12/08/2006  . HYPERTENSION 12/08/2006  . 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 Multiple myeloma ([place]) 04/03/2015  . NECK PAIN 06/05/2008  . OBESITY 09/07/2009  . ONYCHOMYCOSIS 03/09/2009  . OSTEOARTHRITIS 12/08/2006  . PARESTHESIA 12/26/2009  . TOBACCO USE, QUIT 03/09/2009   Past Surgical History:  Procedure Laterality Date  . ABDOMINAL HYSTERECTOMY    . BREAST SURGERY      reports that she has quit smoking. She has never used smokeless tobacco. She reports that she does not drink alcohol or use drugs. family history includes Hypertension in her father, mother, and other. Allergies  Allergen Reactions  . Bee Venom Swelling  . Ceftin [Cefuroxime Axetil]     Felt wierd  . Spider Antivenin [Antivenin Latrodectus Mactans] Other (See Comments)    pain   Current Outpatient Prescriptions on File Prior to Visit  Medication Sig Dispense Refill  . amLODipine (NORVASC) 5 MG tablet TAKE 1 TABLET DAILY 90 tablet 2  . Cholecalciferol (VITAMIN D) 1000 UNITS  capsule Take 1 capsule (1,000 Units total) by mouth daily. 100 capsule 3  . fish oil-omega-3 fatty acids 1000 MG capsule Take 1 g by mouth daily.      [name] HYDROcodone-acetaminophen (NORCO/VICODIN) 5-325 MG tablet Take 0.5-1 tablets by mouth every 6 (six) hours as needed for severe pain. 90 tablet 0  . ibuprofen (ADVIL) 200 MG tablet Take 1-2 tablets (200-400 mg total) by mouth every 8 (eight) hours as needed for moderate pain. 30 tablet 0  . losartan (COZAAR) 100 MG tablet TAKE 1 TABLET EVERY EVENING 90 tablet 2  . metoprolol succinate (TOPROL-XL) 50 MG 24 hr tablet TAKE 1 TABLET TWICE A DAY 180 tablet 2  . Multiple Vitamin (MULTIVITAMIN) tablet Take 1 tablet by mouth daily.      . pravastatin (PRAVACHOL) 40 MG tablet TAKE 1 TABLET DAILY 90 tablet 2  . vitamin C (ASCORBIC ACID) 500 MG tablet Take 500 mg by mouth daily.      [name] acetaminophen (TYLENOL) 325 MG tablet Take 2 tablets (650 mg total) by mouth every 6 (six) hours as needed for mild pain (or Fever >/= 101). (Patient not taking: Reported on 05/22/2016) 40 tablet 0  . doxycycline (VIBRA-TABS) 100 MG tablet Take 1 tablet (100 mg total) by mouth 2 (two) times daily. (Patient not taking: Reported on 05/22/2016) 20 tablet 0   No current facility-administered medications on file prior to visit.    Review of Systems  Constitutional: Negative for unusual diaphoresis or night sweats HENT: Negative for ear swelling or  discharge Eyes: Negative for worsening visual haziness  Respiratory: Negative for choking and stridor.   Gastrointestinal: Negative for distension or worsening eructation Genitourinary: Negative for retention or change in urine volume.  Musculoskeletal: Negative for other MSK pain or swelling Skin: Negative for color change and worsening wound Neurological: Negative for tremors and numbness other than noted  Psychiatric/Behavioral: Negative for decreased concentration or agitation other than above   All other system neg per pt      Objective:   Physical Exam BP (!) 160/78   Pulse 84   Temp 98.4 F (36.9 C) (Oral)   Resp 18   Ht '5\' 7"'$  (1.702 m)   Wt 212 lb (96.2 kg)   SpO2 98%   BMI 33.20 kg/m  VS noted, mild ill  Constitutional: Pt appears in no apparent distress HENT: Head: NCAT.  Right Ear: External ear normal.  Left Ear: External ear normal.  Eyes: . Pupils are equal, round, and reactive to light. Conjunctivae and EOM are normal Bilat tm's with mild erythema.  Max sinus areas non tender.  Pharynx with mild erythema, no exudate Neck: Normal range of motion. Neck supple.  Cardiovascular: Normal rate and regular rhythm.   Pulmonary/Chest: Effort normal and breath sounds decreased bilat without rales or wheezing. noted Neurological: Pt is alert. Not confused , motor grossly intact Skin: Skin is warm. No rash, no LE edema Psychiatric: Pt behavior is normal. No agitation. mild nervous    Assessment & Plan:

## 2016-05-22 NOTE — Assessment & Plan Note (Signed)
Uncontrolled, likely reactive, o/w stable overall by history and exam, recent data reviewed with pt, and pt to continue medical treatment as before,  to f/u any worsening symptoms or concerns BP Readings from Last 3 Encounters:  05/22/16 (!) 160/78  03/21/16 (!) 160/90  12/25/15 129/68

## 2016-05-22 NOTE — Progress Notes (Signed)
Pre visit review using our clinic review tool, if applicable. No additional management support is needed unless otherwise documented below in the visit note. 

## 2016-06-03 ENCOUNTER — Ambulatory Visit (INDEPENDENT_AMBULATORY_CARE_PROVIDER_SITE_OTHER): Payer: Medicare Other | Admitting: Internal Medicine

## 2016-06-03 ENCOUNTER — Other Ambulatory Visit (INDEPENDENT_AMBULATORY_CARE_PROVIDER_SITE_OTHER): Payer: Medicare Other

## 2016-06-03 ENCOUNTER — Encounter: Payer: Self-pay | Admitting: Internal Medicine

## 2016-06-03 VITALS — BP 130/70 | HR 89 | Temp 98.8°F | Wt 214.0 lb

## 2016-06-03 DIAGNOSIS — R634 Abnormal weight loss: Secondary | ICD-10-CM | POA: Diagnosis not present

## 2016-06-03 DIAGNOSIS — R5383 Other fatigue: Secondary | ICD-10-CM

## 2016-06-03 DIAGNOSIS — R202 Paresthesia of skin: Secondary | ICD-10-CM | POA: Diagnosis not present

## 2016-06-03 DIAGNOSIS — C9000 Multiple myeloma not having achieved remission: Secondary | ICD-10-CM

## 2016-06-03 DIAGNOSIS — J3489 Other specified disorders of nose and nasal sinuses: Secondary | ICD-10-CM

## 2016-06-03 DIAGNOSIS — A0472 Enterocolitis due to Clostridium difficile, not specified as recurrent: Secondary | ICD-10-CM

## 2016-06-03 LAB — CBC WITH DIFFERENTIAL/PLATELET
BASOS ABS: 0 10*3/uL (ref 0.0–0.1)
BASOS PCT: 0.2 % (ref 0.0–3.0)
Eosinophils Absolute: 0.2 10*3/uL (ref 0.0–0.7)
Eosinophils Relative: 1.8 % (ref 0.0–5.0)
HEMATOCRIT: 30.4 % — AB (ref 36.0–46.0)
HEMOGLOBIN: 10.4 g/dL — AB (ref 12.0–15.0)
LYMPHS PCT: 23 % (ref 12.0–46.0)
Lymphs Abs: 2 10*3/uL (ref 0.7–4.0)
MCHC: 34.3 g/dL (ref 30.0–36.0)
MCV: 91.8 fl (ref 78.0–100.0)
MONOS PCT: 8.8 % (ref 3.0–12.0)
Monocytes Absolute: 0.8 10*3/uL (ref 0.1–1.0)
NEUTROS ABS: 5.9 10*3/uL (ref 1.4–7.7)
Neutrophils Relative %: 66.2 % (ref 43.0–77.0)
PLATELETS: 325 10*3/uL (ref 150.0–400.0)
RBC: 3.31 Mil/uL — ABNORMAL LOW (ref 3.87–5.11)
RDW: 13.6 % (ref 11.5–15.5)
WBC: 8.9 10*3/uL (ref 4.0–10.5)

## 2016-06-03 LAB — BASIC METABOLIC PANEL
BUN: 11 mg/dL (ref 6–23)
CHLORIDE: 104 meq/L (ref 96–112)
CO2: 29 meq/L (ref 19–32)
Calcium: 9.4 mg/dL (ref 8.4–10.5)
Creatinine, Ser: 0.73 mg/dL (ref 0.40–1.20)
GFR: 97.21 mL/min (ref >60.00)
Glucose, Bld: 99 mg/dL (ref 70–99)
Potassium: 4.2 mEq/L (ref 3.5–5.1)
SODIUM: 138 meq/L (ref 135–145)

## 2016-06-03 LAB — SEDIMENTATION RATE: Sed Rate: 59 mm/hr — ABNORMAL HIGH (ref 0–30)

## 2016-06-03 LAB — HEPATIC FUNCTION PANEL
ALT: 10 U/L (ref 0–35)
AST: 12 U/L (ref 0–37)
Albumin: 3.4 g/dL — ABNORMAL LOW (ref 3.5–5.2)
Alkaline Phosphatase: 83 U/L (ref 39–117)
BILIRUBIN DIRECT: 0.5 mg/dL — AB (ref 0.0–0.3)
TOTAL PROTEIN: 8.8 g/dL — AB (ref 6.0–8.3)
Total Bilirubin: 0.4 mg/dL (ref 0.2–1.2)

## 2016-06-03 LAB — VITAMIN B12: Vitamin B-12: 615 pg/mL (ref 211–911)

## 2016-06-03 MED ORDER — MUPIROCIN 2 % EX OINT: TOPICAL_OINTMENT | CUTANEOUS | 1 refills | Status: DC

## 2016-06-03 NOTE — Progress Notes (Signed)
Pre visit review using our clinic review tool, if applicable. No additional management support is needed unless otherwise documented below in the visit note. 

## 2016-06-03 NOTE — Assessment & Plan Note (Signed)
Wt Readings from Last 3 Encounters:  06/03/16 214 lb (97.1 kg)  05/22/16 212 lb (96.2 kg)  03/21/16 214 lb (97.1 kg)

## 2016-06-03 NOTE — Progress Notes (Signed)
Subjective:  Patient ID: Mindy Taylor, female    DOB: 1929-02-26  Age: 83 y.o. MRN: [id number]  CC: Fatigue   HPI Mindy Taylor presents for fatigue - worse, cough - treated w/Levaquin - per Dr Mindy Taylor. CXR was ok  Outpatient Medications Prior to Visit  Medication Sig Dispense Refill  . acetaminophen (TYLENOL) 325 MG tablet Take 2 tablets (650 mg total) by mouth every 6 (six) hours as needed for mild pain (or Fever >/= 101). 40 tablet 0  . amLODipine (NORVASC) 5 MG tablet TAKE 1 TABLET DAILY 90 tablet 2  . Cholecalciferol (VITAMIN D) 1000 UNITS capsule Take 1 capsule (1,000 Units total) by mouth daily. 100 capsule 3  . fish oil-omega-3 fatty acids 1000 MG capsule Take 1 g by mouth daily.      [name] HYDROcodone-acetaminophen (NORCO/VICODIN) 5-325 MG tablet Take 0.5-1 tablets by mouth every 6 (six) hours as needed for severe pain. 90 tablet 0  . ibuprofen (ADVIL) 200 MG tablet Take 1-2 tablets (200-400 mg total) by mouth every 8 (eight) hours as needed for moderate pain. 30 tablet 0  . losartan (COZAAR) 100 MG tablet TAKE 1 TABLET EVERY EVENING 90 tablet 2  . metoprolol succinate (TOPROL-XL) 50 MG 24 hr tablet TAKE 1 TABLET TWICE A DAY 180 tablet 2  . Multiple Vitamin (MULTIVITAMIN) tablet Take 1 tablet by mouth daily.      . pravastatin (PRAVACHOL) 40 MG tablet TAKE 1 TABLET DAILY 90 tablet 2  . vitamin C (ASCORBIC ACID) 500 MG tablet Take 500 mg by mouth daily.      [name] doxycycline (VIBRA-TABS) 100 MG tablet Take 1 tablet (100 mg total) by mouth 2 (two) times daily. (Patient not taking: Reported on 06/03/2016) 20 tablet 0   No facility-administered medications prior to visit.     ROS Review of Systems  Constitutional: Positive for fatigue. Negative for activity change, appetite change, chills and unexpected weight change.  HENT: Negative for congestion, mouth sores and sinus pressure.   Eyes: Negative for visual disturbance.  Respiratory: Negative for cough and chest tightness.     Gastrointestinal: Negative for abdominal pain and nausea.  Genitourinary: Negative for difficulty urinating, frequency and vaginal pain.  Musculoskeletal: Negative for back pain and gait problem.  Skin: Negative for pallor and rash.  Neurological: Negative for dizziness, tremors, weakness, numbness and headaches.  Psychiatric/Behavioral: Negative for confusion, sleep disturbance and suicidal ideas.    Objective:  BP 130/70   Pulse 89   Temp 98.8 F (37.1 C) (Oral)   Wt 214 lb (97.1 kg)   SpO2 95%   BMI 33.52 kg/m   BP Readings from Last 3 Encounters:  06/03/16 130/70  05/22/16 (!) 160/78  03/21/16 (!) 160/90    Wt Readings from Last 3 Encounters:  06/03/16 214 lb (97.1 kg)  05/22/16 212 lb (96.2 kg)  03/21/16 214 lb (97.1 kg)    Physical Exam  Constitutional: She appears well-developed. No distress.  HENT:  Head: Normocephalic.  Right Ear: External ear normal.  Left Ear: External ear normal.  Nose: Nose normal.  Mouth/Throat: Oropharynx is clear and moist.  Eyes: Conjunctivae are normal. Pupils are equal, round, and reactive to light. Right eye exhibits no discharge. Left eye exhibits no discharge.  Neck: Normal range of motion. Neck supple. No JVD present. No tracheal deviation present. No thyromegaly present.  Cardiovascular: Normal rate, regular rhythm and normal heart sounds.   Pulmonary/Chest: No stridor. No respiratory distress. She has no wheezes.  Abdominal: Soft. Bowel sounds are normal. She exhibits no distension and no mass. There is no tenderness. There is no rebound and no guarding.  Musculoskeletal: She exhibits no edema or tenderness.  Lymphadenopathy:    She has no cervical adenopathy.  Neurological: She displays normal reflexes. No cranial nerve deficit. She exhibits normal muscle tone. Coordination normal.  Skin: No rash noted. No erythema.  Psychiatric: She has a normal mood and affect. Her behavior is normal. Judgment and thought content normal.   R nostril w/crusts and blood  Lab Results  Component Value Date   WBC 6.4 03/21/2016   HGB 11.6 (L) 03/21/2016   HCT 34.9 (L) 03/21/2016   PLT 246.0 03/21/2016   GLUCOSE 94 03/21/2016   CHOL 129 09/06/2012   TRIG 63.0 09/06/2012   HDL 33.90 (L) 09/06/2012   LDLCALC 83 09/06/2012   ALT 15 03/21/2016   AST 18 03/21/2016   NA 139 03/21/2016   K 4.1 03/21/2016   CL 104 03/21/2016   CREATININE 0.77 03/21/2016   BUN 14 03/21/2016   CO2 31 03/21/2016   TSH 0.58 03/21/2016   INR 1.04 03/16/2015   HGBA1C 5.7 08/11/2011    Dg Chest 2 View  Result Date: 05/22/2016 CLINICAL DATA:  Cough and chest soreness. Hypertension. History of multiple myeloma. EXAM: CHEST  2 VIEW COMPARISON:  December 21, 2015 FINDINGS: There is no edema or consolidation. The heart size and pulmonary vascularity are normal. No adenopathy. There is degenerative change in the thoracic spine. There are no evident blastic or lytic bone lesions. There are surgical clips in the left breast region. IMPRESSION: No edema or consolidation. Electronically Signed   By: Lowella Grip III M.D.   On: 05/22/2016 13:03    Assessment & Plan:   There are no diagnoses linked to this encounter. I have discontinued Mindy Taylor's doxycycline. I am also having her maintain her Vitamin D, multivitamin, vitamin C, fish oil-omega-3 fatty acids, ibuprofen, metoprolol succinate, HYDROcodone-acetaminophen, pravastatin, losartan, acetaminophen, and amLODipine.  No orders of the defined types were placed in this encounter.    Follow-up: No Follow-up on file.  Walker Kehr, MD

## 2016-06-03 NOTE — Assessment & Plan Note (Signed)
Pt has declined all Rx for MM Labs

## 2016-06-03 NOTE — Assessment & Plan Note (Signed)
Worse Labs 

## 2016-06-03 NOTE — Assessment & Plan Note (Signed)
Mupirocin

## 2016-06-03 NOTE — Assessment & Plan Note (Signed)
No relapse 

## 2016-06-17 ENCOUNTER — Ambulatory Visit (INDEPENDENT_AMBULATORY_CARE_PROVIDER_SITE_OTHER): Payer: Medicare Other | Admitting: Internal Medicine

## 2016-06-17 ENCOUNTER — Encounter: Payer: Self-pay | Admitting: Internal Medicine

## 2016-06-17 DIAGNOSIS — J209 Acute bronchitis, unspecified: Secondary | ICD-10-CM

## 2016-06-17 DIAGNOSIS — C9000 Multiple myeloma not having achieved remission: Secondary | ICD-10-CM

## 2016-06-17 DIAGNOSIS — I63519 Cerebral infarction due to unspecified occlusion or stenosis of unspecified middle cerebral artery: Secondary | ICD-10-CM | POA: Diagnosis not present

## 2016-06-17 DIAGNOSIS — R040 Epistaxis: Secondary | ICD-10-CM | POA: Diagnosis not present

## 2016-06-17 DIAGNOSIS — F411 Generalized anxiety disorder: Secondary | ICD-10-CM

## 2016-06-17 DIAGNOSIS — R062 Wheezing: Secondary | ICD-10-CM

## 2016-06-17 NOTE — Assessment & Plan Note (Signed)
Resolved

## 2016-06-17 NOTE — Progress Notes (Signed)
Pre visit review using our clinic review tool, if applicable. No additional management support is needed unless otherwise documented below in the visit note. 

## 2016-06-17 NOTE — Progress Notes (Signed)
Subjective:  Patient ID: Mindy Taylor, female    DOB: 01/19/1929  Age: 83 y.o. MRN: [id number]  CC: No chief complaint on file.   HPI Ziyon L Eroh presents for a MM, HTN, dyslipidemia f/u  Outpatient Medications Prior to Visit  Medication Sig Dispense Refill  . acetaminophen (TYLENOL) 325 MG tablet Take 2 tablets (650 mg total) by mouth every 6 (six) hours as needed for mild pain (or Fever >/= 101). 40 tablet 0  . amLODipine (NORVASC) 5 MG tablet TAKE 1 TABLET DAILY 90 tablet 2  . Cholecalciferol (VITAMIN D) 1000 UNITS capsule Take 1 capsule (1,000 Units total) by mouth daily. 100 capsule 3  . fish oil-omega-3 fatty acids 1000 MG capsule Take 1 g by mouth daily.      [name] HYDROcodone-acetaminophen (NORCO/VICODIN) 5-325 MG tablet Take 0.5-1 tablets by mouth every 6 (six) hours as needed for severe pain. 90 tablet 0  . ibuprofen (ADVIL) 200 MG tablet Take 1-2 tablets (200-400 mg total) by mouth every 8 (eight) hours as needed for moderate pain. 30 tablet 0  . losartan (COZAAR) 100 MG tablet TAKE 1 TABLET EVERY EVENING 90 tablet 2  . metoprolol succinate (TOPROL-XL) 50 MG 24 hr tablet TAKE 1 TABLET TWICE A DAY 180 tablet 2  . Multiple Vitamin (MULTIVITAMIN) tablet Take 1 tablet by mouth daily.      . mupirocin ointment (BACTROBAN) 2 % In R nostril tid on a Q tip 22 g 1  . pravastatin (PRAVACHOL) 40 MG tablet TAKE 1 TABLET DAILY 90 tablet 2  . vitamin C (ASCORBIC ACID) 500 MG tablet Take 500 mg by mouth daily.       No facility-administered medications prior to visit.     ROS Review of Systems  Constitutional: Negative for activity change, appetite change, chills, fatigue and unexpected weight change.  HENT: Negative for congestion, mouth sores and sinus pressure.   Eyes: Negative for visual disturbance.  Respiratory: Negative for cough and chest tightness.   Gastrointestinal: Negative for abdominal pain and nausea.  Genitourinary: Negative for difficulty urinating, frequency and  vaginal pain.  Musculoskeletal: Positive for gait problem. Negative for back pain.  Skin: Negative for pallor and rash.  Neurological: Negative for dizziness, tremors, weakness, numbness and headaches.  Psychiatric/Behavioral: Negative for confusion, sleep disturbance and suicidal ideas.    Objective:  BP 120/70   Pulse 84   Wt 215 lb (97.5 kg)   SpO2 97%   BMI 33.67 kg/m   BP Readings from Last 3 Encounters:  06/17/16 120/70  06/03/16 130/70  05/22/16 (!) 160/78    Wt Readings from Last 3 Encounters:  06/17/16 215 lb (97.5 kg)  06/03/16 214 lb (97.1 kg)  05/22/16 212 lb (96.2 kg)    Physical Exam  Constitutional: She appears well-developed. No distress.  HENT:  Head: Normocephalic.  Right Ear: External ear normal.  Left Ear: External ear normal.  Nose: Nose normal.  Mouth/Throat: Oropharynx is clear and moist.  Eyes: Conjunctivae are normal. Pupils are equal, round, and reactive to light. Right eye exhibits no discharge. Left eye exhibits no discharge.  Neck: Normal range of motion. Neck supple. No JVD present. No tracheal deviation present. No thyromegaly present.  Cardiovascular: Normal rate, regular rhythm and normal heart sounds.   Pulmonary/Chest: No stridor. No respiratory distress. She has no wheezes.  Abdominal: Soft. Bowel sounds are normal. She exhibits no distension and no mass. There is no tenderness. There is no rebound and no guarding.  Musculoskeletal: She exhibits no edema or tenderness.  Lymphadenopathy:    She has no cervical adenopathy.  Neurological: She displays normal reflexes. No cranial nerve deficit. She exhibits normal muscle tone. Coordination normal.  Skin: No rash noted. No erythema.  Psychiatric: She has a normal mood and affect. Her behavior is normal. Judgment and thought content normal.  Obese  Lab Results  Component Value Date   WBC 8.9 06/03/2016   HGB 10.4 (L) 06/03/2016   HCT 30.4 (L) 06/03/2016   PLT 325.0 06/03/2016    GLUCOSE 99 06/03/2016   CHOL 129 09/06/2012   TRIG 63.0 09/06/2012   HDL 33.90 (L) 09/06/2012   LDLCALC 83 09/06/2012   ALT 10 06/03/2016   AST 12 06/03/2016   NA 138 06/03/2016   K 4.2 06/03/2016   CL 104 06/03/2016   CREATININE 0.73 06/03/2016   BUN 11 06/03/2016   CO2 29 06/03/2016   TSH 0.58 03/21/2016   INR 1.04 03/16/2015   HGBA1C 5.7 08/11/2011    Dg Chest 2 View  Result Date: 05/22/2016 CLINICAL DATA:  Cough and chest soreness. Hypertension. History of multiple myeloma. EXAM: CHEST  2 VIEW COMPARISON:  December 21, 2015 FINDINGS: There is no edema or consolidation. The heart size and pulmonary vascularity are normal. No adenopathy. There is degenerative change in the thoracic spine. There are no evident blastic or lytic bone lesions. There are surgical clips in the left breast region. IMPRESSION: No edema or consolidation. Electronically Signed   By: Lowella Grip III M.D.   On: 05/22/2016 13:03    Assessment & Plan:   There are no diagnoses linked to this encounter. I am having Ms. Bachtell maintain her Vitamin D, multivitamin, vitamin C, fish oil-omega-3 fatty acids, ibuprofen, metoprolol succinate, HYDROcodone-acetaminophen, pravastatin, losartan, acetaminophen, amLODipine, and mupirocin ointment.  No orders of the defined types were placed in this encounter.    Follow-up: No Follow-up on file.  Walker Kehr, MD

## 2016-06-17 NOTE — Assessment & Plan Note (Signed)
10/17 the pt stopped Rx and stopped seeing Dr Marin Olp - discussed

## 2016-06-17 NOTE — Assessment & Plan Note (Signed)
Wt Readings from Last 3 Encounters:  06/17/16 215 lb (97.5 kg)  06/03/16 214 lb (97.1 kg)  05/22/16 212 lb (96.2 kg)

## 2016-06-17 NOTE — Patient Instructions (Addendum)
Try echinacea tea

## 2016-06-17 NOTE — Assessment & Plan Note (Signed)
Doing well 

## 2016-06-17 NOTE — Assessment & Plan Note (Signed)
No relapse 

## 2016-07-04 ENCOUNTER — Other Ambulatory Visit: Payer: Self-pay | Admitting: Internal Medicine

## 2016-07-11 ENCOUNTER — Other Ambulatory Visit: Payer: Self-pay | Admitting: Internal Medicine

## 2016-07-18 ENCOUNTER — Ambulatory Visit: Payer: Medicare Other | Admitting: Internal Medicine

## 2016-07-27 ENCOUNTER — Other Ambulatory Visit: Payer: Self-pay | Admitting: Internal Medicine

## 2016-09-17 ENCOUNTER — Encounter: Payer: Self-pay | Admitting: Internal Medicine

## 2016-09-17 ENCOUNTER — Other Ambulatory Visit (INDEPENDENT_AMBULATORY_CARE_PROVIDER_SITE_OTHER): Payer: Medicare Other

## 2016-09-17 ENCOUNTER — Ambulatory Visit (INDEPENDENT_AMBULATORY_CARE_PROVIDER_SITE_OTHER): Payer: Medicare Other | Admitting: Internal Medicine

## 2016-09-17 VITALS — BP 162/80 | HR 77 | Temp 99.4°F | Ht 67.0 in | Wt 213.1 lb

## 2016-09-17 DIAGNOSIS — I6350 Cerebral infarction due to unspecified occlusion or stenosis of unspecified cerebral artery: Secondary | ICD-10-CM

## 2016-09-17 DIAGNOSIS — R5383 Other fatigue: Secondary | ICD-10-CM

## 2016-09-17 DIAGNOSIS — D649 Anemia, unspecified: Secondary | ICD-10-CM

## 2016-09-17 DIAGNOSIS — I10 Essential (primary) hypertension: Secondary | ICD-10-CM

## 2016-09-17 DIAGNOSIS — C9000 Multiple myeloma not having achieved remission: Secondary | ICD-10-CM | POA: Diagnosis not present

## 2016-09-17 DIAGNOSIS — Z23 Encounter for immunization: Secondary | ICD-10-CM

## 2016-09-17 LAB — BASIC METABOLIC PANEL
BUN: 8 mg/dL (ref 6–23)
CHLORIDE: 105 meq/L (ref 96–112)
CO2: 30 meq/L (ref 19–32)
CREATININE: 0.71 mg/dL (ref 0.40–1.20)
Calcium: 9.7 mg/dL (ref 8.4–10.5)
GFR: 100.31 mL/min (ref >60.00)
Glucose, Bld: 97 mg/dL (ref 70–99)
Potassium: 3.6 mEq/L (ref 3.5–5.1)
Sodium: 138 mEq/L (ref 135–145)

## 2016-09-17 LAB — CBC WITH DIFFERENTIAL/PLATELET
BASOS ABS: 0 10*3/uL (ref 0.0–0.1)
Basophils Relative: 0.4 % (ref 0.0–3.0)
EOS PCT: 1.6 % (ref 0.0–5.0)
Eosinophils Absolute: 0.1 10*3/uL (ref 0.0–0.7)
HEMATOCRIT: 34.2 % — AB (ref 36.0–46.0)
Hemoglobin: 11.4 g/dL — ABNORMAL LOW (ref 12.0–15.0)
Lymphocytes Relative: 33.3 % (ref 12.0–46.0)
Lymphs Abs: 2 10*3/uL (ref 0.7–4.0)
MCHC: 33.5 g/dL (ref 30.0–36.0)
MCV: 94.3 fl (ref 78.0–100.0)
MONOS PCT: 9.4 % (ref 3.0–12.0)
Monocytes Absolute: 0.6 10*3/uL (ref 0.1–1.0)
NEUTROS ABS: 3.3 10*3/uL (ref 1.4–7.7)
Neutrophils Relative %: 55.3 % (ref 43.0–77.0)
PLATELETS: 213 10*3/uL (ref 150.0–400.0)
RBC: 3.62 Mil/uL — ABNORMAL LOW (ref 3.87–5.11)
RDW: 14.8 % (ref 11.5–15.5)
WBC: 6 10*3/uL (ref 4.0–10.5)

## 2016-09-17 LAB — SEDIMENTATION RATE: SED RATE: 124 mm/h — AB (ref 0–30)

## 2016-09-17 LAB — HEPATIC FUNCTION PANEL
ALT: 11 U/L (ref 0–35)
AST: 17 U/L (ref 0–37)
Albumin: 3.8 g/dL (ref 3.5–5.2)
Alkaline Phosphatase: 72 U/L (ref 39–117)
Bilirubin, Direct: 0 mg/dL (ref 0.0–0.3)
Total Bilirubin: 0.9 mg/dL (ref 0.2–1.2)
Total Protein: 9.1 g/dL — ABNORMAL HIGH (ref 6.0–8.3)

## 2016-09-17 NOTE — Assessment & Plan Note (Signed)
CBC

## 2016-09-17 NOTE — Assessment & Plan Note (Signed)
  Cont w/Metoprolol, Amlodipine, Losartan, ASA, Pravachol

## 2016-09-17 NOTE — Progress Notes (Signed)
Pre visit review using our clinic review tool, if applicable. No additional management support is needed unless otherwise documented below in the visit note. 

## 2016-09-17 NOTE — Assessment & Plan Note (Signed)
the pt stopped Rx and stopped seeing Dr Marin Olp - she is aware of risks Labs

## 2016-09-17 NOTE — Addendum Note (Signed)
Addended by: Karren Cobble on: 09/17/2016 11:48 AM   Modules accepted: Orders

## 2016-09-17 NOTE — Patient Instructions (Addendum)
Echinacea 400 mg twice a day for "common colds"

## 2016-09-17 NOTE — Assessment & Plan Note (Signed)
Labs

## 2016-09-17 NOTE — Assessment & Plan Note (Signed)
Cont with Metoprolol, Amlodipine, Losartan

## 2016-09-17 NOTE — Progress Notes (Signed)
Subjective:  Patient ID: Mindy Taylor, female    DOB: [birth year]  Age: 83 y.o. MRN: [id number]  CC: No chief complaint on file.   HPI Mindy Taylor presents for MM, dyslipidemia, OA f/u  Outpatient Medications Prior to Visit  Medication Sig Dispense Refill  . amLODipine (NORVASC) 5 MG tablet TAKE 1 TABLET DAILY 90 tablet 2  . Cholecalciferol (VITAMIN D) 1000 UNITS capsule Take 1 capsule (1,000 Units total) by mouth daily. 100 capsule 3  . fish oil-omega-3 fatty acids 1000 MG capsule Take 1 g by mouth daily.      [name] HYDROcodone-acetaminophen (NORCO/VICODIN) 5-325 MG tablet Take 0.5-1 tablets by mouth every 6 (six) hours as needed for severe pain. 90 tablet 0  . losartan (COZAAR) 100 MG tablet TAKE 1 TABLET EVERY EVENING 90 tablet 2  . Multiple Vitamin (MULTIVITAMIN) tablet Take 1 tablet by mouth daily.      . pravastatin (PRAVACHOL) 40 MG tablet TAKE 1 TABLET DAILY 90 tablet 2  . TOPROL XL 50 MG 24 hr tablet TAKE 1 TABLET TWICE A DAY 180 tablet 1  . vitamin C (ASCORBIC ACID) 500 MG tablet Take 500 mg by mouth daily.      [name] acetaminophen (TYLENOL) 325 MG tablet Take 2 tablets (650 mg total) by mouth every 6 (six) hours as needed for mild pain (or Fever >/= 101). 40 tablet 0  . ibuprofen (ADVIL) 200 MG tablet Take 1-2 tablets (200-400 mg total) by mouth every 8 (eight) hours as needed for moderate pain. 30 tablet 0  . mupirocin ointment (BACTROBAN) 2 % In R nostril tid on a Q tip 22 g 1   No facility-administered medications prior to visit.     ROS Review of Systems  Constitutional: Positive for fatigue. Negative for activity change, appetite change, chills and unexpected weight change.  HENT: Negative for congestion, mouth sores and sinus pressure.   Eyes: Negative for visual disturbance.  Respiratory: Negative for cough and chest tightness.   Gastrointestinal: Negative for abdominal pain and nausea.  Genitourinary: Negative for difficulty urinating, frequency and vaginal pain.    Musculoskeletal: Positive for arthralgias. Negative for back pain and gait problem.  Skin: Negative for pallor and rash.  Neurological: Negative for dizziness, tremors, weakness, numbness and headaches.  Psychiatric/Behavioral: Negative for confusion, sleep disturbance and suicidal ideas. The patient is nervous/anxious.     Objective:  BP (!) 162/80 (BP Location: Left Arm, Patient Position: Sitting, Cuff Size: Large)   Pulse 77   Temp 99.4 F (37.4 C) (Oral)   Ht '5\' 7"'$  (1.702 m)   Wt 213 lb 1.9 oz (96.7 kg)   SpO2 99%   BMI 33.38 kg/m   BP Readings from Last 3 Encounters:  09/17/16 (!) 162/80  06/17/16 120/70  06/03/16 130/70    Wt Readings from Last 3 Encounters:  09/17/16 213 lb 1.9 oz (96.7 kg)  06/17/16 215 lb (97.5 kg)  06/03/16 214 lb (97.1 kg)    Physical Exam  Constitutional: She appears well-developed. No distress.  HENT:  Head: Normocephalic.  Right Ear: External ear normal.  Left Ear: External ear normal.  Nose: Nose normal.  Mouth/Throat: Oropharynx is clear and moist.  Eyes: Conjunctivae are normal. Pupils are equal, round, and reactive to light. Right eye exhibits no discharge. Left eye exhibits no discharge.  Neck: Normal range of motion. Neck supple. No JVD present. No tracheal deviation present. No thyromegaly present.  Cardiovascular: Normal rate, regular rhythm and normal heart sounds.  Pulmonary/Chest: No stridor. No respiratory distress. She has no wheezes.  Abdominal: Soft. Bowel sounds are normal. She exhibits no distension and no mass. There is no tenderness. There is no rebound and no guarding.  Musculoskeletal: She exhibits tenderness. She exhibits no edema.  Lymphadenopathy:    She has no cervical adenopathy.  Neurological: She displays normal reflexes. No cranial nerve deficit. She exhibits normal muscle tone. Coordination normal.  Skin: No rash noted. No erythema.  Psychiatric: She has a normal mood and affect. Her behavior is normal.  Judgment and thought content normal.  obese A/o/c  Lab Results  Component Value Date   WBC 8.9 06/03/2016   HGB 10.4 (L) 06/03/2016   HCT 30.4 (L) 06/03/2016   PLT 325.0 06/03/2016   GLUCOSE 99 06/03/2016   CHOL 129 09/06/2012   TRIG 63.0 09/06/2012   HDL 33.90 (L) 09/06/2012   LDLCALC 83 09/06/2012   ALT 10 06/03/2016   AST 12 06/03/2016   NA 138 06/03/2016   K 4.2 06/03/2016   CL 104 06/03/2016   CREATININE 0.73 06/03/2016   BUN 11 06/03/2016   CO2 29 06/03/2016   TSH 0.58 03/21/2016   INR 1.04 03/16/2015   HGBA1C 5.7 08/11/2011    Dg Chest 2 View  Result Date: 05/22/2016 CLINICAL DATA:  Cough and chest soreness. Hypertension. History of multiple myeloma. EXAM: CHEST  2 VIEW COMPARISON:  December 21, 2015 FINDINGS: There is no edema or consolidation. The heart size and pulmonary vascularity are normal. No adenopathy. There is degenerative change in the thoracic spine. There are no evident blastic or lytic bone lesions. There are surgical clips in the left breast region. IMPRESSION: No edema or consolidation. Electronically Signed   By: Lowella Grip III M.D.   On: 05/22/2016 13:03    Assessment & Plan:   There are no diagnoses linked to this encounter. I have discontinued Ms. Clodfelter's ibuprofen, acetaminophen, and mupirocin ointment. I am also having her maintain her Vitamin D, multivitamin, vitamin C, fish oil-omega-3 fatty acids, HYDROcodone-acetaminophen, amLODipine, TOPROL XL, pravastatin, and losartan.  No orders of the defined types were placed in this encounter.    Follow-up: No Follow-up on file.  Walker Kehr, MD

## 2016-10-03 ENCOUNTER — Other Ambulatory Visit: Payer: Self-pay | Admitting: Internal Medicine

## 2016-11-22 ENCOUNTER — Encounter (HOSPITAL_BASED_OUTPATIENT_CLINIC_OR_DEPARTMENT_OTHER): Payer: Self-pay | Admitting: *Deleted

## 2016-11-22 ENCOUNTER — Emergency Department (HOSPITAL_BASED_OUTPATIENT_CLINIC_OR_DEPARTMENT_OTHER)
Admission: EM | Admit: 2016-11-22 | Discharge: 2016-11-22 | Disposition: A | Discharge status: Home / Self Care | Payer: Medicare Other | Attending: Emergency Medicine | Admitting: Emergency Medicine

## 2016-11-22 DIAGNOSIS — W57XXXA Bitten or stung by nonvenomous insect and other nonvenomous arthropods, initial encounter: Secondary | ICD-10-CM | POA: Diagnosis not present

## 2016-11-22 DIAGNOSIS — Z79899 Other long term (current) drug therapy: Secondary | ICD-10-CM | POA: Insufficient documentation

## 2016-11-22 DIAGNOSIS — Z87891 Personal history of nicotine dependence: Secondary | ICD-10-CM | POA: Insufficient documentation

## 2016-11-22 DIAGNOSIS — I10 Essential (primary) hypertension: Secondary | ICD-10-CM | POA: Insufficient documentation

## 2016-11-22 DIAGNOSIS — M25522 Pain in left elbow: Secondary | ICD-10-CM | POA: Insufficient documentation

## 2016-11-22 DIAGNOSIS — S50362D Insect bite (nonvenomous) of left elbow, subsequent encounter: Secondary | ICD-10-CM | POA: Diagnosis not present

## 2016-11-22 MED ORDER — DEXAMETHASONE 6 MG PO TABS
10.0000 mg | ORAL_TABLET | Freq: Once | ORAL | Status: AC
  Administered 2016-11-22: 22:00:00 10 mg via ORAL
  Filled 2016-11-22: qty 1

## 2016-11-22 NOTE — ED Provider Notes (Signed)
[facility] DEPT [facility] Provider Note   CSN: [id number] Arrival date & time: 11/22/16  2032   By signing my name below, I, Soijett Blue, attest that this documentation has been prepared under the direction and in the presence of Martinique Russo, PA-C Electronically Signed: Soijett Blue, ED Scribe. 11/22/16. 9:56 PM.  History   Chief Complaint Chief Complaint  Patient presents with  . Insect Bite    HPI Mindy Taylor is a 83 y.o. female with a PMHx of HTN, who presents to the Emergency Department complaining of an insect bite to her left elbow onset yesterday. Pt reports associated pruritic and burning sensation to her left elbow. Pt has tried rubbing alcohol without any medications for the relief of her symptoms. She notes that she is allergic to "stings and things." Pt reports that when he has similar symptoms, she is given an injection that alleviated her symptoms. Denies seeing a bug or feeling a bite to her left elbow. She denies SOB, trouble swallowing, lip swelling, tongue swelling, nausea, drainage, F, and any other symptoms.    The history is provided by the patient. No language interpreter was used.    Past Medical History:  Diagnosis Date  . ANXIETY 03/05/2007  . ATAXIA 12/26/2009  . BEE STING 03/05/2007  . BRONCHITIS, ACUTE 06/28/2010  . CANDIDIASIS, VAGINAL 09/08/2008  . CEREBROVASCULAR ACCIDENT, HX OF 01/04/2010  . CERUMEN IMPACTION 06/05/2008  . DIZZINESS 12/26/2009  . Epistaxis 06/05/2008  . Headache(784.0) 06/05/2008  . HYPERLIPIDEMIA 12/08/2006  . HYPERTENSION 12/08/2006  . 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 Multiple myeloma ([place]) 04/03/2015  . NECK PAIN 06/05/2008  . OBESITY 09/07/2009  . ONYCHOMYCOSIS 03/09/2009  . OSTEOARTHRITIS 12/08/2006  . PARESTHESIA 12/26/2009  . TOBACCO USE, QUIT 03/09/2009    Patient Active Problem List   Diagnosis Date Noted  . Sore in nose 06/03/2016  . Cough 05/22/2016  . HLD (hyperlipidemia)   . Cellulitis 12/24/2015  . Cellulitis of left  upper extremity 12/23/2015  . Well adult exam 11/19/2015  . Loss of weight 10/12/2015  . Normocytic anemia 05/07/2015  . Multiple myeloma ([place]) 04/03/2015  . Arthralgia 02/01/2015  . Right wrist pain 12/01/2014  . Arm pain, left 11/22/2014  . No blood products 03/11/2014  . Right knee pain 07/26/2013  . Chest pain 04/26/2013  . Left ankle swelling 03/04/2013  . Clostridium difficile diarrhea 10/22/2012  . Right hand pain 09/08/2012  . Elbow pain, right 03/15/2012  . Knee pain, left 10/02/2011  . Hypercalcemia 10/02/2011  . Hyperglycemia 08/08/2011  . Neck pain, chronic 05/05/2011  . Knee pain, bilateral 02/18/2011  . Fatigue 01/03/2011  . Bronchitis, acute, with bronchospasm 12/11/2010  . Wheezing 11/28/2010  . Preventative health care 11/28/2010  . Cerebral artery occlusion with cerebral infarction ([place]) 01/04/2010  . CVA (cerebral vascular accident) ([place]) 01/04/2010  . DIZZINESS 12/26/2009  . ATAXIA 12/26/2009  . PARESTHESIA 12/26/2009  . Morbid obesity due to excess calories ([place]) 09/07/2009  . ONYCHOMYCOSIS 03/09/2009  . TOBACCO USE, QUIT 03/09/2009  . CANDIDIASIS, VAGINAL 09/08/2008  . CERUMEN IMPACTION 06/05/2008  . Headache(784.0) 06/05/2008  . Epistaxis 06/05/2008  . Anxiety state 03/05/2007  . BEE STING 03/05/2007  . Dyslipidemia 12/08/2006  . Essential hypertension 12/08/2006  . Osteoarthritis 12/08/2006    Past Surgical History:  Procedure Laterality Date  . ABDOMINAL HYSTERECTOMY    . BREAST SURGERY      OB History    No data available       Home Medications  Prior to Admission medications   Medication Sig Start Date End Date Taking? Authorizing Provider  amLODipine (NORVASC) 5 MG tablet TAKE 1 TABLET DAILY 10/03/16   Plotnikov, Evie Lacks, MD  Cholecalciferol (VITAMIN D) 1000 UNITS capsule Take 1 capsule (1,000 Units total) by mouth daily. 01/03/11   Plotnikov, Evie Lacks, MD  fish oil-omega-3 fatty acids 1000 MG capsule Take 1 g by mouth daily.       [provider]  HYDROcodone-acetaminophen (NORCO/VICODIN) 5-325 MG tablet Take 0.5-1 tablets by mouth every 6 (six) hours as needed for severe pain. 10/12/15   Plotnikov, Evie Lacks, MD  losartan (COZAAR) 100 MG tablet TAKE 1 TABLET EVERY EVENING 07/28/16   Biagio Borg, MD  Multiple Vitamin (MULTIVITAMIN) tablet Take 1 tablet by mouth daily.      [provider]  pravastatin (PRAVACHOL) 40 MG tablet TAKE 1 TABLET DAILY 07/11/16   Plotnikov, Evie Lacks, MD  TOPROL XL 50 MG 24 hr tablet TAKE 1 TABLET TWICE A DAY 07/04/16   Plotnikov, Evie Lacks, MD  vitamin C (ASCORBIC ACID) 500 MG tablet Take 500 mg by mouth daily.      [provider]    Family History Family History  Problem Relation Age of Onset  . Hypertension Mother   . Hypertension Father   . Hypertension Other     Social History Social History  Substance Use Topics  . Smoking status: Former Research scientist (life sciences)  . Smokeless tobacco: Never Used     Comment: Not sure when; many, many years   . Alcohol use No     Allergies   Bee venom; Ceftin [cefuroxime axetil]; and Spider antivenin [black widow spider antivenin (l.mactans)]   Review of Systems Review of Systems  Constitutional: Negative for fever.  HENT: Negative for facial swelling and trouble swallowing.        No lip or tongue swelling  Respiratory: Negative for shortness of breath.   Gastrointestinal: Negative for nausea and vomiting.  Musculoskeletal: Negative for arthralgias.  Skin: Positive for rash.       No drainage     Physical Exam Updated Vital Signs BP 135/73 (BP Location: Right Arm)   Pulse 81   Temp 99.1 F (37.3 C) (Oral)   Resp 18   SpO2 98%     Physical Exam  Constitutional: She appears well-developed and well-nourished. No distress.  Tolerating secretions.  HENT:  Head: Normocephalic and atraumatic.  Mouth/Throat: Oropharynx is clear and moist.  No facial edema  Eyes: Conjunctivae are normal.  Neck: Normal range of  motion.  Cardiovascular: Normal rate and intact distal pulses.   Pulmonary/Chest: Effort normal. No respiratory distress.  Musculoskeletal: Normal range of motion. She exhibits no edema, tenderness or deformity.  Nl ROM of left elbow pain, no warmth or edematous.   Skin: Rash noted. Rash is papular. There is erythema.  1 cm erythematous punctate lesion with mild surrounding edema and erythema. No fluctuance. No purulent drainage. Mild TTP.   Psychiatric: She has a normal mood and affect. Her behavior is normal.  Nursing note and vitals reviewed.   ED Treatments / Results  DIAGNOSTIC STUDIES: Oxygen Saturation is 100% on RA, nl by my interpretation.    COORDINATION OF CARE: 9:53 PM Discussed treatment plan with pt at bedside and pt agreed to plan.   Labs (all labs ordered are listed, but only abnormal results are displayed) Labs Reviewed - No data to display  EKG  EKG Interpretation None  Radiology No results found.  Procedures Procedures (including critical care time)  Medications Ordered in ED Medications  dexamethasone (DECADRON) tablet 10 mg (10 mg Oral Given 11/22/16 2215)     Initial Impression / Assessment and Plan / ED Course  I have reviewed the triage vital signs and the nursing notes.  Pertinent labs & imaging results that were available during my care of the patient were reviewed by me and considered in my medical decision making (see chart for details).     Patient presenting with complaints of insect bite to left elbow. Very minimal edema surrounding bites. Elbow with normal range of motion. Patient re-evaluated prior to dc, is hemodynamically stable, in no respiratory distress, and denies the feeling of throat closing. Pt has been advised to take OTC benadryl & return to the ED if they have a mod-severe allergic rxn (s/s including throat closing, difficulty breathing, swelling of lips face or tongue). Pt is to follow up with their PCP. Pt is  agreeable with plan & verbalizes understanding.  Patient discussed with and seen by Dr. Ralene Bathe.  Discussed results, findings, treatment and follow up. Patient advised of return precautions. Patient verbalized understanding and agreed with plan.  Final Clinical Impressions(s) / ED Diagnoses   Final diagnoses:  Insect bite, initial encounter    New Prescriptions Discharge Medication List as of 11/22/2016 10:27 PM     I personally performed the services described in this documentation, which was scribed in my presence. The recorded information has been reviewed and is accurate.     Russo, Martin[place] N, PA-C 11/23/16 Rod Mae, MD 12/02/16 [phone]

## 2016-11-22 NOTE — Discharge Instructions (Signed)
Please read instructions below. You can take Benadryl as needed every 6 hours for itching.  Avoid scratching as much as possible. Follow up with your primary care provider if rash doesn't improve in the next few days. Return to the ER for fever, difficulty breathing or swallowing, or new or worsening symptoms.

## 2016-11-22 NOTE — ED Triage Notes (Signed)
Pt states that she thinks she was bit yesterday by a spider, no insect was visible to her. She noticed an itchy and burning sensation on her left elbow yesterday and now it is painful and swollen. She did not take any medications PTA and reports she is allergic to any insect bite.

## 2016-12-24 ENCOUNTER — Encounter: Payer: Self-pay | Admitting: Internal Medicine

## 2016-12-24 ENCOUNTER — Ambulatory Visit (INDEPENDENT_AMBULATORY_CARE_PROVIDER_SITE_OTHER): Payer: Medicare Other | Admitting: Internal Medicine

## 2016-12-24 DIAGNOSIS — C9000 Multiple myeloma not having achieved remission: Secondary | ICD-10-CM

## 2016-12-24 DIAGNOSIS — Z8673 Personal history of transient ischemic attack (TIA), and cerebral infarction without residual deficits: Secondary | ICD-10-CM

## 2016-12-24 DIAGNOSIS — M2550 Pain in unspecified joint: Secondary | ICD-10-CM | POA: Diagnosis not present

## 2016-12-24 DIAGNOSIS — M25562 Pain in left knee: Secondary | ICD-10-CM

## 2016-12-24 DIAGNOSIS — M25561 Pain in right knee: Secondary | ICD-10-CM | POA: Diagnosis not present

## 2016-12-24 DIAGNOSIS — I63519 Cerebral infarction due to unspecified occlusion or stenosis of unspecified middle cerebral artery: Secondary | ICD-10-CM

## 2016-12-24 MED ORDER — METOPROLOL SUCCINATE ER 50 MG PO TB24: 50.0000 mg | ORAL_TABLET | Freq: Two times a day (BID) | ORAL | 1 refills | Status: DC

## 2016-12-24 MED ORDER — METHYLPREDNISOLONE ACETATE 40 MG/ML IJ SUSP
40.0000 mg | Freq: Once | INTRAMUSCULAR | Status: AC
  Administered 2016-12-24: 40 mg via INTRA_ARTICULAR

## 2016-12-24 MED ORDER — PRAVASTATIN SODIUM 40 MG PO TABS: 40.0000 mg | ORAL_TABLET | Freq: Every day | ORAL | 2 refills | Status: DC

## 2016-12-24 NOTE — Assessment & Plan Note (Addendum)
The pt would like to see another hematologist at [facility] for a second opinion ( In 10/17 the pt stopped Rx and stopped seeing Dr Marin Olp)

## 2016-12-24 NOTE — Patient Instructions (Signed)
Postprocedure instructions :    A Band-Aid should be left on for 12 hours. Injection therapy is not a cure itself. It is used in conjunction with other modalities. You can use nonsteroidal anti-inflammatories like ibuprofen , hot and cold compresses. Rest is recommended in the next 24 hours. You need to report immediately  if fever, chills or any signs of infection develop. 

## 2016-12-24 NOTE — Assessment & Plan Note (Signed)
Will inject B

## 2016-12-24 NOTE — Progress Notes (Signed)
Subjective:  Patient ID: Mindy Taylor, female    DOB: 1928/11/11  Age: 83 y.o. MRN: [id number]  CC: Follow-up (3 mo fu--)   HPI Mindy Taylor presents for MM, HTN, dyslipidemia. The pt would like to see another hematologist at [facility].  Outpatient Medications Prior to Visit  Medication Sig Dispense Refill  . amLODipine (NORVASC) 5 MG tablet TAKE 1 TABLET DAILY 90 tablet 2  . Cholecalciferol (VITAMIN D) 1000 UNITS capsule Take 1 capsule (1,000 Units total) by mouth daily. 100 capsule 3  . fish oil-omega-3 fatty acids 1000 MG capsule Take 1 g by mouth daily.      [name] HYDROcodone-acetaminophen (NORCO/VICODIN) 5-325 MG tablet Take 0.5-1 tablets by mouth every 6 (six) hours as needed for severe pain. 90 tablet 0  . losartan (COZAAR) 100 MG tablet TAKE 1 TABLET EVERY EVENING 90 tablet 2  . Multiple Vitamin (MULTIVITAMIN) tablet Take 1 tablet by mouth daily.      . pravastatin (PRAVACHOL) 40 MG tablet TAKE 1 TABLET DAILY 90 tablet 2  . TOPROL XL 50 MG 24 hr tablet TAKE 1 TABLET TWICE A DAY 180 tablet 1  . vitamin C (ASCORBIC ACID) 500 MG tablet Take 500 mg by mouth daily.       No facility-administered medications prior to visit.     ROS Review of Systems  Constitutional: Negative for activity change, appetite change, chills, fatigue and unexpected weight change.  HENT: Negative for congestion, mouth sores and sinus pressure.   Eyes: Negative for visual disturbance.  Respiratory: Negative for cough and chest tightness.   Gastrointestinal: Negative for abdominal pain and nausea.  Genitourinary: Negative for difficulty urinating, frequency and vaginal pain.  Musculoskeletal: Positive for arthralgias. Negative for back pain and gait problem.  Skin: Negative for pallor and rash.  Neurological: Negative for dizziness, tremors, weakness, numbness and headaches.  Psychiatric/Behavioral: Negative for confusion and sleep disturbance.    Objective:  BP 128/78   Pulse 80   Temp 99.1 F (37.3  C)   Ht 5\' 7"  (1.702 m)   Wt 215 lb (97.5 kg)   SpO2 98%   BMI 33.67 kg/m   BP Readings from Last 3 Encounters:  12/24/16 128/78  11/22/16 135/73  09/17/16 (!) 162/80    Wt Readings from Last 3 Encounters:  12/24/16 215 lb (97.5 kg)  09/17/16 213 lb 1.9 oz (96.7 kg)  06/17/16 215 lb (97.5 kg)    Physical Exam  Constitutional: She appears well-developed. No distress.  HENT:  Head: Normocephalic.  Right Ear: External ear normal.  Left Ear: External ear normal.  Nose: Nose normal.  Mouth/Throat: Oropharynx is clear and moist.  Eyes: Pupils are equal, round, and reactive to light. Conjunctivae are normal. Right eye exhibits no discharge. Left eye exhibits no discharge.  Neck: Normal range of motion. Neck supple. No JVD present. No tracheal deviation present. No thyromegaly present.  Cardiovascular: Normal rate, regular rhythm and normal heart sounds.   Pulmonary/Chest: No stridor. No respiratory distress. She has no wheezes.  Abdominal: Soft. Bowel sounds are normal. She exhibits no distension and no mass. There is no tenderness. There is no rebound and no guarding.  Musculoskeletal: She exhibits tenderness. She exhibits no edema.  Lymphadenopathy:    She has no cervical adenopathy.  Neurological: She displays normal reflexes. No cranial nerve deficit. She exhibits normal muscle tone. Coordination abnormal.  Skin: No rash noted. No erythema.  Psychiatric: She has a normal mood and affect. Her behavior is  normal. Judgment and thought content normal.    Procedure Note :     Procedure : Joint Injection,  R and L knee   Indication:  Joint osteoarthritis with refractory  chronic pain.   Risks including unsuccessful procedure , bleeding, infection, bruising, skin atrophy, "steroid flare-up" and others were explained to the patient in detail as well as the benefits. Informed consent was obtained and signed.   Tthe patient was placed in a comfortable position. Lateral approach was  used. Skin was prepped with Betadine and alcohol  and anesthetized a cooling spray. Then, a 5 cc syringe with a 1.5 inch long 25-gauge needle was used for a joint injection.. The needle was advanced  Into the knee joint cavity. I aspirated a small amount of intra-articular fluid to confirm correct placement of the needle and injected the joint with 5 mL of 2% lidocaine and 40 mg of Depo-Medrol each.  Band-Aids were applied.   Tolerated well. Complications: None. Good pain relief following the procedure.   Postprocedure instructions :    A Band-Aid should be left on for 12 hours. Injection therapy is not a cure itself. It is used in conjunction with other modalities. You can use nonsteroidal anti-inflammatories like ibuprofen , hot and cold compresses. Rest is recommended in the next 24 hours. You need to report immediately  if fever, chills or any signs of infection develop.   Lab Results  Component Value Date   WBC 6.0 09/17/2016   HGB 11.4 (L) 09/17/2016   HCT 34.2 (L) 09/17/2016   PLT 213.0 09/17/2016   GLUCOSE 97 09/17/2016   CHOL 129 09/06/2012   TRIG 63.0 09/06/2012   HDL 33.90 (L) 09/06/2012   LDLCALC 83 09/06/2012   ALT 11 09/17/2016   AST 17 09/17/2016   NA 138 09/17/2016   K 3.6 09/17/2016   CL 105 09/17/2016   CREATININE 0.71 09/17/2016   BUN 8 09/17/2016   CO2 30 09/17/2016   TSH 0.58 03/21/2016   INR 1.04 03/16/2015   HGBA1C 5.7 08/11/2011    No results found.  Assessment & Plan:   There are no diagnoses linked to this encounter. I am having Ms. Hair maintain her Vitamin D, multivitamin, vitamin C, fish oil-omega-3 fatty acids, HYDROcodone-acetaminophen, TOPROL XL, pravastatin, losartan, and amLODipine.  No orders of the defined types were placed in this encounter.    Follow-up: No Follow-up on file.  Walker Kehr, MD

## 2016-12-28 NOTE — Assessment & Plan Note (Signed)
Norco prn 

## 2016-12-28 NOTE — Assessment & Plan Note (Signed)
Losartan, Norvasc, Toprol, ASA 

## 2017-01-13 DIAGNOSIS — E8809 Other disorders of plasma-protein metabolism, not elsewhere classified: Secondary | ICD-10-CM | POA: Diagnosis not present

## 2017-01-13 DIAGNOSIS — M19011 Primary osteoarthritis, right shoulder: Secondary | ICD-10-CM | POA: Diagnosis not present

## 2017-01-13 DIAGNOSIS — M170 Bilateral primary osteoarthritis of knee: Secondary | ICD-10-CM | POA: Diagnosis not present

## 2017-01-13 DIAGNOSIS — R718 Other abnormality of red blood cells: Secondary | ICD-10-CM | POA: Diagnosis not present

## 2017-01-13 DIAGNOSIS — M47816 Spondylosis without myelopathy or radiculopathy, lumbar region: Secondary | ICD-10-CM | POA: Diagnosis not present

## 2017-01-13 DIAGNOSIS — Z87891 Personal history of nicotine dependence: Secondary | ICD-10-CM | POA: Diagnosis not present

## 2017-01-13 DIAGNOSIS — D649 Anemia, unspecified: Secondary | ICD-10-CM | POA: Diagnosis not present

## 2017-01-13 DIAGNOSIS — M160 Bilateral primary osteoarthritis of hip: Secondary | ICD-10-CM | POA: Diagnosis not present

## 2017-01-13 DIAGNOSIS — M47814 Spondylosis without myelopathy or radiculopathy, thoracic region: Secondary | ICD-10-CM | POA: Diagnosis not present

## 2017-01-13 DIAGNOSIS — M19012 Primary osteoarthritis, left shoulder: Secondary | ICD-10-CM | POA: Diagnosis not present

## 2017-01-14 DIAGNOSIS — R718 Other abnormality of red blood cells: Secondary | ICD-10-CM | POA: Diagnosis not present

## 2017-01-14 DIAGNOSIS — E8809 Other disorders of plasma-protein metabolism, not elsewhere classified: Secondary | ICD-10-CM | POA: Diagnosis not present

## 2017-01-22 DIAGNOSIS — Z87891 Personal history of nicotine dependence: Secondary | ICD-10-CM | POA: Diagnosis not present

## 2017-01-22 DIAGNOSIS — D649 Anemia, unspecified: Secondary | ICD-10-CM | POA: Diagnosis not present

## 2017-01-22 DIAGNOSIS — E8809 Other disorders of plasma-protein metabolism, not elsewhere classified: Secondary | ICD-10-CM | POA: Diagnosis not present

## 2017-02-21 IMAGING — CR DG BONE SURVEY MET
8 of 10 series · 8 of 10 positions shown · non-contrast
Comparison: None.

CLINICAL DATA: MGUS of unknown significance

EXAM:
METASTATIC BONE SURVEY

[w skull lat]
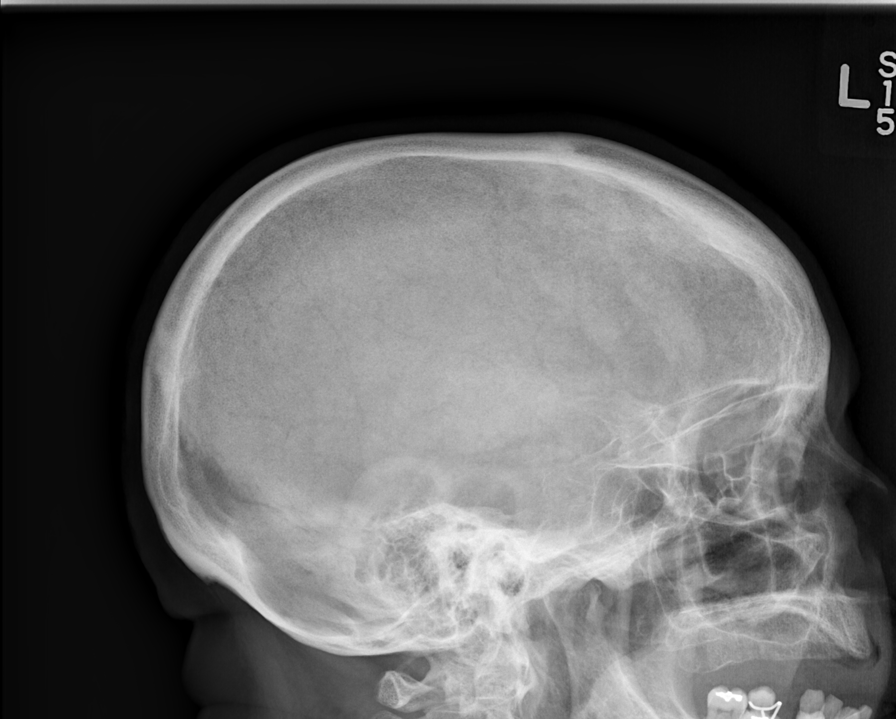

[w chest pa]
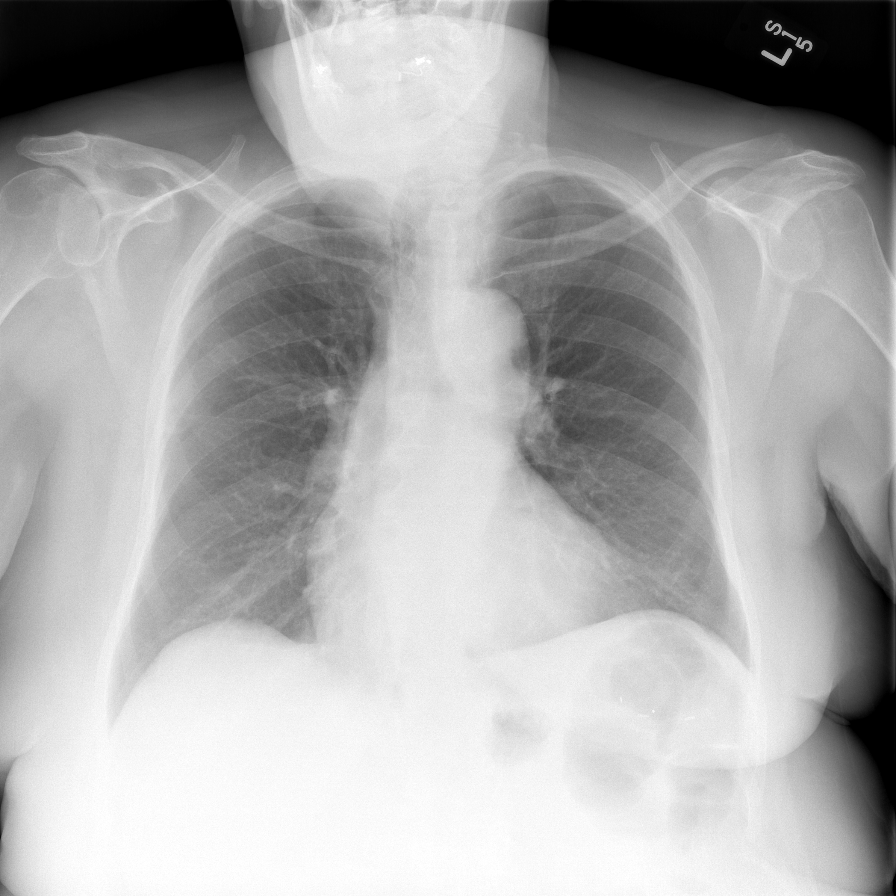

[w c-spine a.p.]
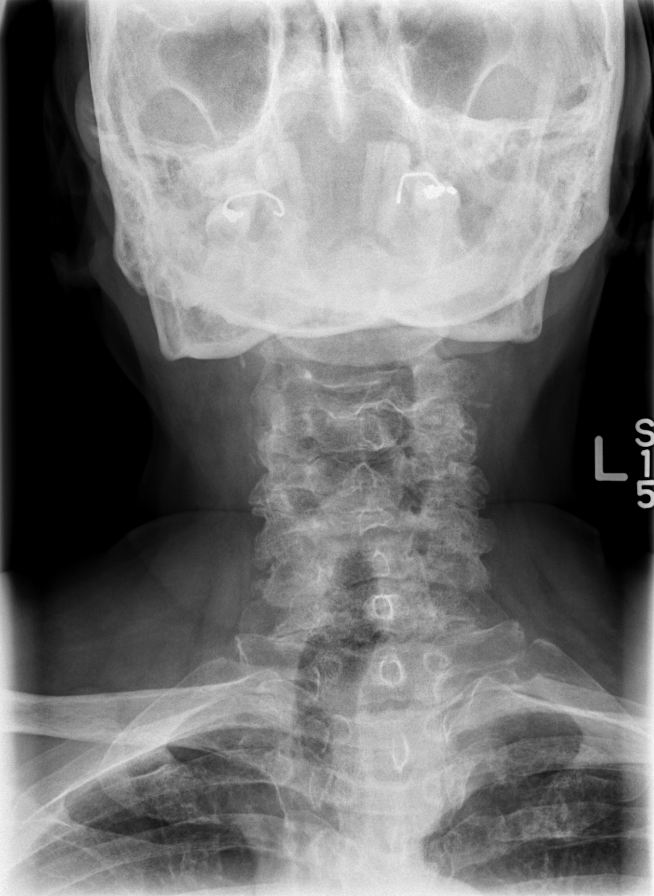

[w c-spine lat]
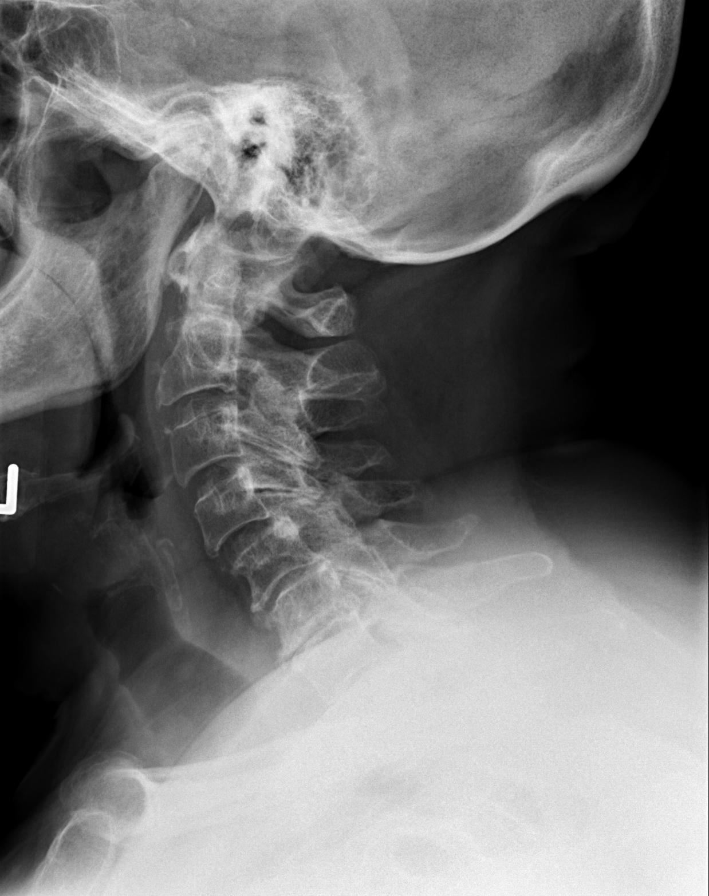

[w shoulder ap external left]
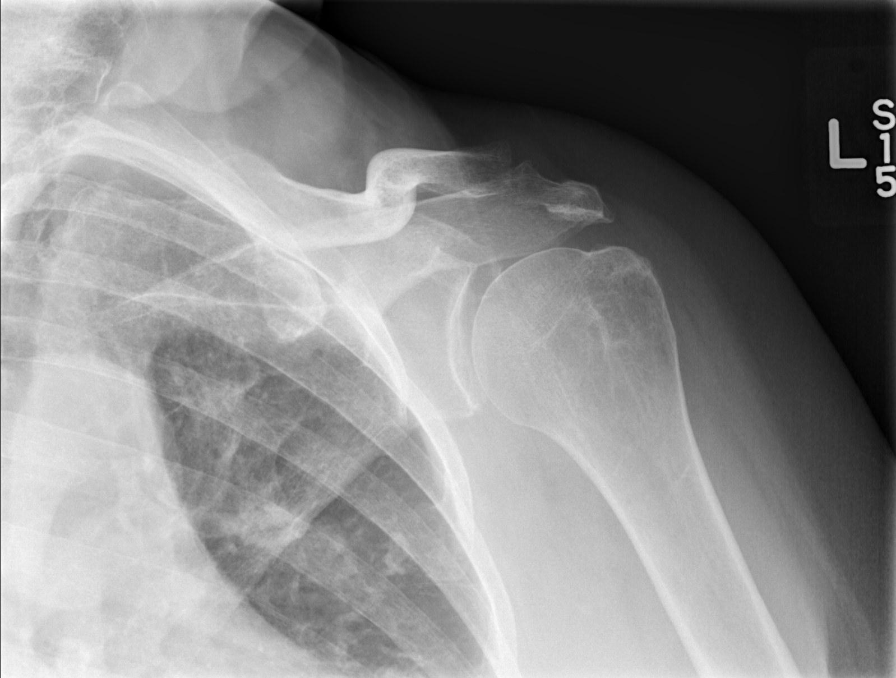

[w shoulder ap external righ]
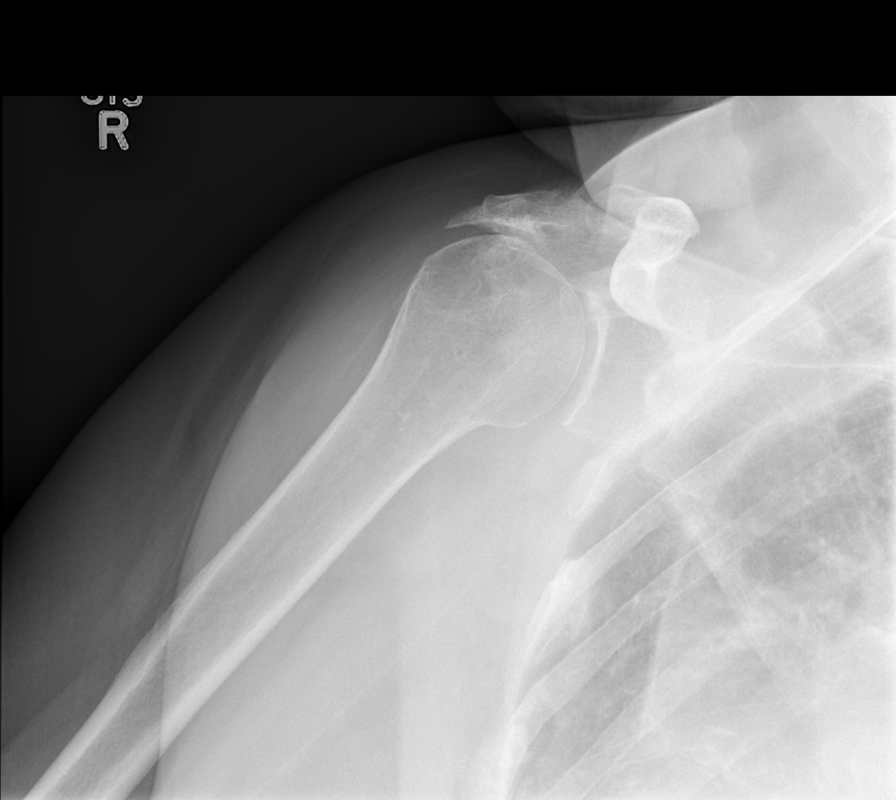

[w humerus ap right *]
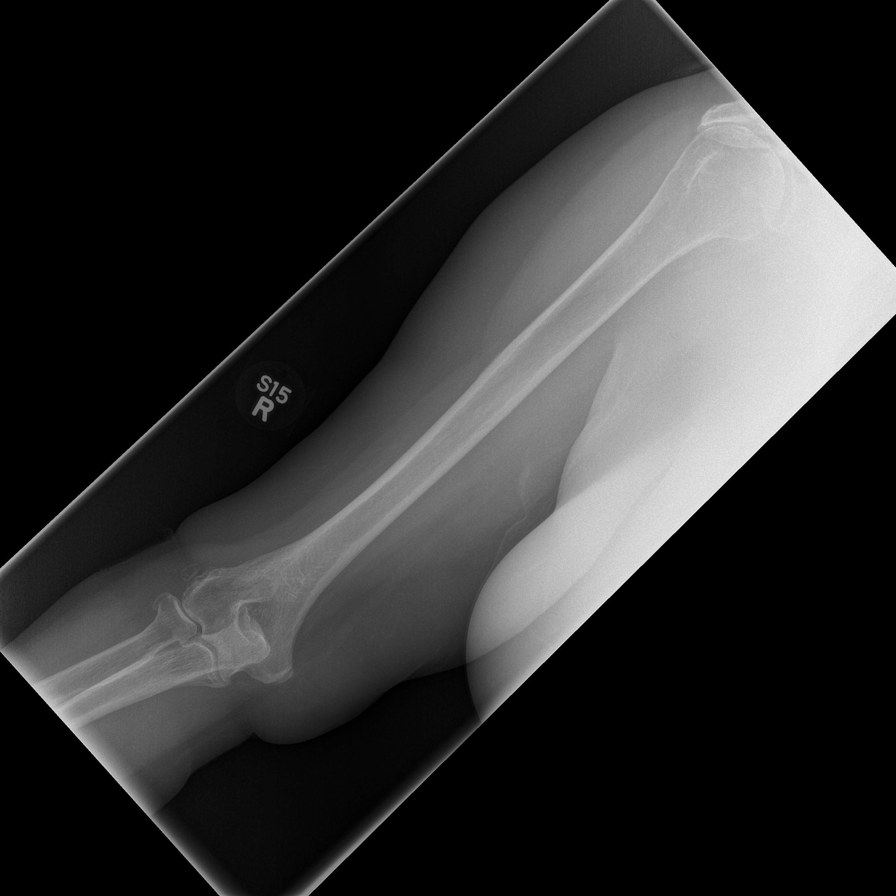

[w humerus ap left *]
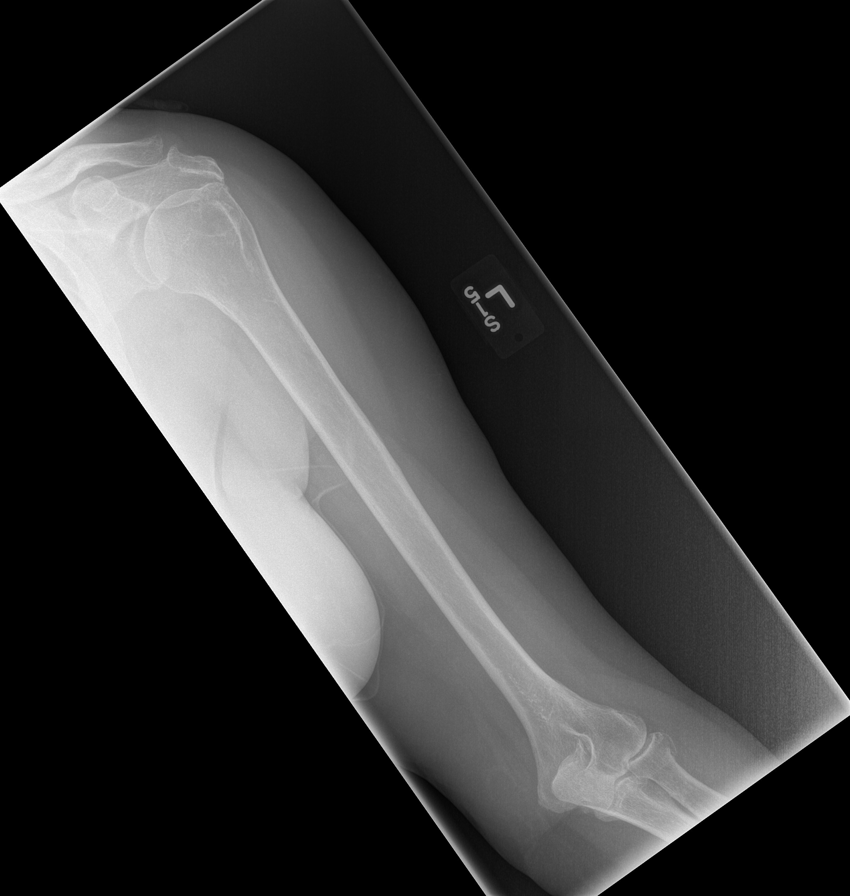

[8 of 10 positions shown; findings below may reference images not displayed]

FINDINGS: No focal lytic lesion or acute bony abnormality within the bony
structures. Diffuse spondylosis throughout the cervical, thoracic
and lumbar spine. No fracture. Slight anterolisthesis of C4 on C5
related to facet disease. Degenerative changes within the hips,
shoulders, knees.

Heart is normal size. Lungs are clear. No effusions. No focal rib
lesion.
IMPRESSION: No acute bony abnormality or focal bone lesion.

## 2017-03-01 ENCOUNTER — Other Ambulatory Visit: Payer: Self-pay | Admitting: Internal Medicine

## 2017-03-05 DIAGNOSIS — M170 Bilateral primary osteoarthritis of knee: Secondary | ICD-10-CM | POA: Diagnosis not present

## 2017-03-17 DIAGNOSIS — E8809 Other disorders of plasma-protein metabolism, not elsewhere classified: Secondary | ICD-10-CM | POA: Diagnosis not present

## 2017-03-24 DIAGNOSIS — Z87891 Personal history of nicotine dependence: Secondary | ICD-10-CM | POA: Diagnosis not present

## 2017-03-24 DIAGNOSIS — D509 Iron deficiency anemia, unspecified: Secondary | ICD-10-CM | POA: Diagnosis not present

## 2017-03-24 DIAGNOSIS — C9000 Multiple myeloma not having achieved remission: Secondary | ICD-10-CM | POA: Diagnosis not present

## 2017-03-24 DIAGNOSIS — E8809 Other disorders of plasma-protein metabolism, not elsewhere classified: Secondary | ICD-10-CM | POA: Diagnosis not present

## 2017-03-27 ENCOUNTER — Ambulatory Visit (INDEPENDENT_AMBULATORY_CARE_PROVIDER_SITE_OTHER): Payer: Medicare Other | Admitting: Internal Medicine

## 2017-03-27 ENCOUNTER — Encounter: Payer: Self-pay | Admitting: Internal Medicine

## 2017-03-27 VITALS — BP 130/78 | HR 71 | Temp 98.8°F | Ht 67.0 in | Wt 212.0 lb

## 2017-03-27 DIAGNOSIS — I63519 Cerebral infarction due to unspecified occlusion or stenosis of unspecified middle cerebral artery: Secondary | ICD-10-CM | POA: Diagnosis not present

## 2017-03-27 DIAGNOSIS — C9000 Multiple myeloma not having achieved remission: Secondary | ICD-10-CM | POA: Diagnosis not present

## 2017-03-27 DIAGNOSIS — R5383 Other fatigue: Secondary | ICD-10-CM

## 2017-03-27 DIAGNOSIS — I10 Essential (primary) hypertension: Secondary | ICD-10-CM | POA: Diagnosis not present

## 2017-03-27 DIAGNOSIS — D649 Anemia, unspecified: Secondary | ICD-10-CM

## 2017-03-27 DIAGNOSIS — Z23 Encounter for immunization: Secondary | ICD-10-CM

## 2017-03-27 DIAGNOSIS — I6350 Cerebral infarction due to unspecified occlusion or stenosis of unspecified cerebral artery: Secondary | ICD-10-CM

## 2017-03-27 NOTE — Assessment & Plan Note (Signed)
No change Could be MM related

## 2017-03-27 NOTE — Progress Notes (Signed)
Subjective:  Patient ID: Mindy Taylor, female    DOB: Oct 19, 1928  Age: 83 y.o. MRN: [id number]  CC: No chief complaint on file.   HPI Mindy Taylor presents for MM, HTN, OA f/u  Outpatient Medications Prior to Visit  Medication Sig Dispense Refill  . amLODipine (NORVASC) 5 MG tablet TAKE 1 TABLET DAILY 90 tablet 2  . Cholecalciferol (VITAMIN D) 1000 UNITS capsule Take 1 capsule (1,000 Units total) by mouth daily. 100 capsule 3  . fish oil-omega-3 fatty acids 1000 MG capsule Take 1 g by mouth daily.      [name] HYDROcodone-acetaminophen (NORCO/VICODIN) 5-325 MG tablet Take 0.5-1 tablets by mouth every 6 (six) hours as needed for severe pain. 90 tablet 0  . losartan (COZAAR) 100 MG tablet TAKE 1 TABLET EVERY EVENING 90 tablet 2  . metoprolol succinate (TOPROL XL) 50 MG 24 hr tablet Take 1 tablet (50 mg total) by mouth 2 (two) times daily. Take with or immediately following a meal. 180 tablet 1  . metoprolol succinate (TOPROL-XL) 50 MG 24 hr tablet Take 1 tablet (50 mg total) by mouth 2 (two) times daily. 180 tablet 1  . Multiple Vitamin (MULTIVITAMIN) tablet Take 1 tablet by mouth daily.      . pravastatin (PRAVACHOL) 40 MG tablet Take 1 tablet (40 mg total) by mouth daily. 90 tablet 2  . vitamin C (ASCORBIC ACID) 500 MG tablet Take 500 mg by mouth daily.       No facility-administered medications prior to visit.     ROS Review of Systems  Constitutional: Positive for fatigue. Negative for activity change, appetite change, chills and unexpected weight change.  HENT: Negative for congestion, mouth sores and sinus pressure.   Eyes: Negative for visual disturbance.  Respiratory: Negative for cough and chest tightness.   Gastrointestinal: Negative for abdominal pain and nausea.  Genitourinary: Negative for difficulty urinating, frequency and vaginal pain.  Musculoskeletal: Positive for arthralgias and back pain. Negative for gait problem.  Skin: Negative for pallor and rash.    Neurological: Negative for dizziness, tremors, weakness, numbness and headaches.  Psychiatric/Behavioral: Negative for confusion and sleep disturbance.    Objective:  BP 130/78 (BP Location: Left Arm, Patient Position: Sitting, Cuff Size: Large)   Pulse 71   Temp 98.8 F (37.1 C) (Oral)   Ht 5\' 7"  (1.702 m)   Wt 212 lb (96.2 kg)   SpO2 98%   BMI 33.20 kg/m   BP Readings from Last 3 Encounters:  03/27/17 130/78  12/24/16 128/78  11/22/16 135/73    Wt Readings from Last 3 Encounters:  03/27/17 212 lb (96.2 kg)  12/24/16 215 lb (97.5 kg)  09/17/16 213 lb 1.9 oz (96.7 kg)    Physical Exam  Constitutional: She appears well-developed. No distress.  HENT:  Head: Normocephalic.  Right Ear: External ear normal.  Left Ear: External ear normal.  Nose: Nose normal.  Mouth/Throat: Oropharynx is clear and moist.  Eyes: Pupils are equal, round, and reactive to light. Conjunctivae are normal. Right eye exhibits no discharge. Left eye exhibits no discharge.  Neck: Normal range of motion. Neck supple. No JVD present. No tracheal deviation present. No thyromegaly present.  Cardiovascular: Normal rate, regular rhythm and normal heart sounds.   Pulmonary/Chest: No stridor. No respiratory distress. She has no wheezes.  Abdominal: Soft. Bowel sounds are normal. She exhibits no distension and no mass. There is no tenderness. There is no rebound and no guarding.  Musculoskeletal: She exhibits tenderness.  She exhibits no edema.  Lymphadenopathy:    She has no cervical adenopathy.  Neurological: She displays normal reflexes. No cranial nerve deficit. She exhibits normal muscle tone. Coordination abnormal.  Skin: No rash noted. No erythema.  Psychiatric: She has a normal mood and affect. Her behavior is normal. Judgment and thought content normal.  LS tender Cane  Lab Results  Component Value Date   WBC 6.0 09/17/2016   HGB 11.4 (L) 09/17/2016   HCT 34.2 (L) 09/17/2016   PLT 213.0  09/17/2016   GLUCOSE 97 09/17/2016   CHOL 129 09/06/2012   TRIG 63.0 09/06/2012   HDL 33.90 (L) 09/06/2012   LDLCALC 83 09/06/2012   ALT 11 09/17/2016   AST 17 09/17/2016   NA 138 09/17/2016   K 3.6 09/17/2016   CL 105 09/17/2016   CREATININE 0.71 09/17/2016   BUN 8 09/17/2016   CO2 30 09/17/2016   TSH 0.58 03/21/2016   INR 1.04 03/16/2015   HGBA1C 5.7 08/11/2011    No results found.  Assessment & Plan:   There are no diagnoses linked to this encounter. I am having Ms. Margerum maintain her Vitamin D, multivitamin, vitamin C, fish oil-omega-3 fatty acids, HYDROcodone-acetaminophen, losartan, amLODipine, pravastatin, metoprolol succinate, and metoprolol succinate.  No orders of the defined types were placed in this encounter.    Follow-up: No Follow-up on file.  Walker Kehr, MD

## 2017-03-27 NOTE — Assessment & Plan Note (Signed)
Losartan, Norvasc, Toprol, ASA

## 2017-03-27 NOTE — Assessment & Plan Note (Signed)
Metoprolol, Amlodipine, Losartan

## 2017-03-27 NOTE — Assessment & Plan Note (Signed)
The pt saw Dr Rudean Hitt at [facility]:  Impression Recommendations 10/18: 1. IgG lambda plasma cell dyscrasia, possibly, smoldering myeloma Once again I had a very lengthy discussion with Mindy Taylor regarding her condition.  It seems that there has been continued slow increase in monoclonal protein. We will recheck monoclonal protein in 2 months and if the trend persists I may consider low-dose chemotherapy in order to prevent further progression of the disease. Either a combination of Revlimid with low-dose dexamethasone or a single agent Revlimid could be utilized in this case. Our goal will be to prevent devastating complications of the progressing plasma cell dyscrasia without debilitating side effects of the treatment (especially since the patient is unwilling to accept blood products)  2. Anemia certainly, anemia could be a consequence of advanced plasma cell dyscrasia, however, overall stability of anemia since 2016 with out continues anti-myeloma treatment argues against this assumption. Prior to discovery of plasma cell dyscrasia in 2016 patient had normal hemoglobin concentration. There are no signs of iron deficiency, vitamin B12 and folate deficiency, hemolysis at this point.  3. Follow-up MD visit-2 months Lab visit-1 week prior  Skeletal survey to be repeated in February 2019

## 2017-03-27 NOTE — Patient Instructions (Signed)
MC well w/Jill 

## 2017-03-27 NOTE — Assessment & Plan Note (Signed)
Per Dr Leighton Ruff plan

## 2017-04-01 NOTE — Addendum Note (Signed)
Addended by: Karren Cobble on: 04/01/2017 01:23 PM   Modules accepted: Orders

## 2017-04-20 ENCOUNTER — Encounter: Payer: Self-pay | Admitting: Internal Medicine

## 2017-04-20 ENCOUNTER — Ambulatory Visit (INDEPENDENT_AMBULATORY_CARE_PROVIDER_SITE_OTHER): Payer: Medicare Other | Admitting: Internal Medicine

## 2017-04-20 VITALS — BP 122/74 | HR 85 | Temp 98.7°F | Ht 67.0 in | Wt 209.0 lb

## 2017-04-20 DIAGNOSIS — I10 Essential (primary) hypertension: Secondary | ICD-10-CM

## 2017-04-20 DIAGNOSIS — R059 Cough, unspecified: Secondary | ICD-10-CM

## 2017-04-20 DIAGNOSIS — I6350 Cerebral infarction due to unspecified occlusion or stenosis of unspecified cerebral artery: Secondary | ICD-10-CM

## 2017-04-20 DIAGNOSIS — R05 Cough: Secondary | ICD-10-CM | POA: Diagnosis not present

## 2017-04-20 DIAGNOSIS — Z23 Encounter for immunization: Secondary | ICD-10-CM

## 2017-04-20 DIAGNOSIS — S8012XA Contusion of left lower leg, initial encounter: Secondary | ICD-10-CM

## 2017-04-20 MED ORDER — PROMETHAZINE-CODEINE 6.25-10 MG/5ML PO SYRP: 5.0000 mL | ORAL_SOLUTION | ORAL | 0 refills | Status: DC | PRN

## 2017-04-20 MED ORDER — AZITHROMYCIN 250 MG PO TABS: ORAL_TABLET | ORAL | 0 refills | Status: DC

## 2017-04-20 NOTE — Progress Notes (Signed)
Subjective:  Patient ID: Mindy Taylor, female    DOB: 02/26/1929  Age: 83 y.o. MRN: [id number]  CC: No chief complaint on file.   HPI Mindy Taylor presents for a fall 2 wks ago: c/o L lat shin hematoma C/o cough - mucus is clear, thick; ST for 2 weeks C/o wt loss x 1 mo  Outpatient Medications Prior to Visit  Medication Sig Dispense Refill  . amLODipine (NORVASC) 5 MG tablet TAKE 1 TABLET DAILY 90 tablet 2  . Cholecalciferol (VITAMIN D) 1000 UNITS capsule Take 1 capsule (1,000 Units total) by mouth daily. 100 capsule 3  . fish oil-omega-3 fatty acids 1000 MG capsule Take 1 g by mouth daily.      [name] HYDROcodone-acetaminophen (NORCO/VICODIN) 5-325 MG tablet Take 0.5-1 tablets by mouth every 6 (six) hours as needed for severe pain. 90 tablet 0  . losartan (COZAAR) 100 MG tablet TAKE 1 TABLET EVERY EVENING 90 tablet 2  . metoprolol succinate (TOPROL-XL) 50 MG 24 hr tablet Take 1 tablet (50 mg total) by mouth 2 (two) times daily. 180 tablet 1  . Multiple Vitamin (MULTIVITAMIN) tablet Take 1 tablet by mouth daily.      . pravastatin (PRAVACHOL) 40 MG tablet Take 1 tablet (40 mg total) by mouth daily. 90 tablet 2  . vitamin C (ASCORBIC ACID) 500 MG tablet Take 500 mg by mouth daily.       No facility-administered medications prior to visit.     ROS Review of Systems  Constitutional: Positive for unexpected weight change. Negative for activity change, appetite change, chills and fatigue.  HENT: Negative for congestion, mouth sores and sinus pressure.   Eyes: Negative for visual disturbance.  Respiratory: Positive for cough. Negative for chest tightness.   Gastrointestinal: Negative for abdominal pain and nausea.  Genitourinary: Negative for difficulty urinating, frequency and vaginal pain.  Musculoskeletal: Positive for arthralgias. Negative for back pain and gait problem.  Skin: Positive for color change. Negative for pallor, rash and wound.  Neurological: Negative for dizziness,  tremors, weakness, numbness and headaches.  Hematological: Bruises/bleeds easily.  Psychiatric/Behavioral: Negative for confusion and sleep disturbance.    Objective:  BP 122/74 (BP Location: Left Arm, Patient Position: Sitting, Cuff Size: Large)   Pulse 85   Temp 98.7 F (37.1 C) (Oral)   Ht 5\' 7"  (1.702 m)   Wt 209 lb (94.8 kg)   SpO2 98%   BMI 32.73 kg/m   BP Readings from Last 3 Encounters:  04/20/17 122/74  03/27/17 130/78  12/24/16 128/78    Wt Readings from Last 3 Encounters:  04/20/17 209 lb (94.8 kg)  03/27/17 212 lb (96.2 kg)  12/24/16 215 lb (97.5 kg)    Physical Exam  Constitutional: She appears well-developed. No distress.  HENT:  Head: Normocephalic.  Right Ear: External ear normal.  Left Ear: External ear normal.  Nose: Nose normal.  Mouth/Throat: Oropharynx is clear and moist.  Eyes: Conjunctivae are normal. Pupils are equal, round, and reactive to light. Right eye exhibits no discharge. Left eye exhibits no discharge.  Neck: Normal range of motion. Neck supple. No JVD present. No tracheal deviation present. No thyromegaly present.  Cardiovascular: Normal rate, regular rhythm and normal heart sounds.  Pulmonary/Chest: No stridor. No respiratory distress. She has no wheezes.  Abdominal: Soft. Bowel sounds are normal. She exhibits no distension and no mass. There is no tenderness. There is no rebound and no guarding.  Musculoskeletal: She exhibits tenderness and deformity. She exhibits  no edema.  Lymphadenopathy:    She has no cervical adenopathy.  Neurological: She displays normal reflexes. No cranial nerve deficit. She exhibits normal muscle tone. Coordination normal.  Skin: No rash noted. No erythema.  Psychiatric: She has a normal mood and affect. Her behavior is normal. Judgment and thought content normal.  L distal lat shin w/a large hematoma and bruising around it L knee w/abrasion - healing  Lab Results  Component Value Date   WBC 6.0  09/17/2016   HGB 11.4 (L) 09/17/2016   HCT 34.2 (L) 09/17/2016   PLT 213.0 09/17/2016   GLUCOSE 97 09/17/2016   CHOL 129 09/06/2012   TRIG 63.0 09/06/2012   HDL 33.90 (L) 09/06/2012   LDLCALC 83 09/06/2012   ALT 11 09/17/2016   AST 17 09/17/2016   NA 138 09/17/2016   K 3.6 09/17/2016   CL 105 09/17/2016   CREATININE 0.71 09/17/2016   BUN 8 09/17/2016   CO2 30 09/17/2016   TSH 0.58 03/21/2016   INR 1.04 03/16/2015   HGBA1C 5.7 08/11/2011    No results found.  Assessment & Plan:   There are no diagnoses linked to this encounter. I am having Mindy Taylor maintain her Vitamin D, multivitamin, vitamin C, fish oil-omega-3 fatty acids, HYDROcodone-acetaminophen, losartan, amLODipine, pravastatin, and metoprolol succinate.  No orders of the defined types were placed in this encounter.    Follow-up: No Follow-up on file.  Walker Kehr, MD

## 2017-04-20 NOTE — Assessment & Plan Note (Signed)
URI Prom-cod syr Z pac

## 2017-04-20 NOTE — Assessment & Plan Note (Signed)
BP Readings from Last 3 Encounters:  04/20/17 122/74  03/27/17 130/78  12/24/16 128/78

## 2017-04-20 NOTE — Assessment & Plan Note (Signed)
Wt loss noted 

## 2017-04-23 ENCOUNTER — Other Ambulatory Visit: Payer: Self-pay | Admitting: Internal Medicine

## 2017-05-07 ENCOUNTER — Encounter: Payer: Self-pay | Admitting: Family

## 2017-05-07 ENCOUNTER — Ambulatory Visit (INDEPENDENT_AMBULATORY_CARE_PROVIDER_SITE_OTHER): Payer: Medicare Other | Admitting: Family

## 2017-05-07 ENCOUNTER — Emergency Department (HOSPITAL_COMMUNITY): Payer: Medicare Other

## 2017-05-07 ENCOUNTER — Emergency Department (HOSPITAL_COMMUNITY)
Admission: EM | Admit: 2017-05-07 | Discharge: 2017-05-07 | Disposition: A | Discharge status: Home / Self Care | Payer: Medicare Other | Attending: Emergency Medicine | Admitting: Emergency Medicine

## 2017-05-07 ENCOUNTER — Encounter (HOSPITAL_COMMUNITY): Payer: Self-pay

## 2017-05-07 ENCOUNTER — Other Ambulatory Visit: Payer: Self-pay

## 2017-05-07 VITALS — BP 134/82 | HR 81 | Temp 98.5°F | Ht 67.0 in | Wt 206.0 lb

## 2017-05-07 DIAGNOSIS — S8012XA Contusion of left lower leg, initial encounter: Secondary | ICD-10-CM | POA: Diagnosis not present

## 2017-05-07 DIAGNOSIS — L03116 Cellulitis of left lower limb: Secondary | ICD-10-CM | POA: Diagnosis not present

## 2017-05-07 DIAGNOSIS — R2242 Localized swelling, mass and lump, left lower limb: Secondary | ICD-10-CM | POA: Diagnosis present

## 2017-05-07 DIAGNOSIS — I10 Essential (primary) hypertension: Secondary | ICD-10-CM | POA: Diagnosis not present

## 2017-05-07 DIAGNOSIS — Z87891 Personal history of nicotine dependence: Secondary | ICD-10-CM | POA: Insufficient documentation

## 2017-05-07 DIAGNOSIS — L02416 Cutaneous abscess of left lower limb: Secondary | ICD-10-CM | POA: Diagnosis not present

## 2017-05-07 DIAGNOSIS — R05 Cough: Secondary | ICD-10-CM | POA: Diagnosis not present

## 2017-05-07 DIAGNOSIS — Z79899 Other long term (current) drug therapy: Secondary | ICD-10-CM | POA: Insufficient documentation

## 2017-05-07 DIAGNOSIS — T148XXA Other injury of unspecified body region, initial encounter: Secondary | ICD-10-CM

## 2017-05-07 LAB — CBC WITH DIFFERENTIAL/PLATELET
BASOS ABS: 0 10*3/uL (ref 0.0–0.1)
Basophils Relative: 0 %
EOS ABS: 0.1 10*3/uL (ref 0.0–0.7)
EOS PCT: 1 %
HCT: 25.6 % — ABNORMAL LOW (ref 36.0–46.0)
HEMOGLOBIN: 8.4 g/dL — AB (ref 12.0–15.0)
LYMPHS ABS: 2 10*3/uL (ref 0.7–4.0)
LYMPHS PCT: 31 %
MCH: 32.1 pg (ref 26.0–34.0)
MCHC: 32.8 g/dL (ref 30.0–36.0)
MCV: 97.7 fL (ref 78.0–100.0)
Monocytes Absolute: 0.5 10*3/uL (ref 0.1–1.0)
Monocytes Relative: 7 %
NEUTROS PCT: 61 %
Neutro Abs: 4 10*3/uL (ref 1.7–7.7)
PLATELETS: 252 10*3/uL (ref 150–400)
RBC: 2.62 MIL/uL — AB (ref 3.87–5.11)
RDW: 15.1 % (ref 11.5–15.5)
WBC: 6.5 10*3/uL (ref 4.0–10.5)

## 2017-05-07 LAB — COMPREHENSIVE METABOLIC PANEL
ALT: 14 U/L (ref 14–54)
ANION GAP: 3 — AB (ref 5–15)
AST: 20 U/L (ref 15–41)
Albumin: 2.8 g/dL — ABNORMAL LOW (ref 3.5–5.0)
Alkaline Phosphatase: 71 U/L (ref 38–126)
BILIRUBIN TOTAL: 1.1 mg/dL (ref 0.3–1.2)
BUN: 10 mg/dL (ref 6–20)
CO2: 28 mmol/L (ref 22–32)
Calcium: 9.4 mg/dL (ref 8.9–10.3)
Chloride: 104 mmol/L (ref 101–111)
Creatinine, Ser: 0.89 mg/dL (ref 0.44–1.00)
GFR, EST NON AFRICAN AMERICAN: 57 mL/min — AB (ref >60)
Glucose, Bld: 108 mg/dL — ABNORMAL HIGH (ref 65–99)
POTASSIUM: 3.3 mmol/L — AB (ref 3.5–5.1)
Sodium: 135 mmol/L (ref 135–145)
TOTAL PROTEIN: 9.4 g/dL — AB (ref 6.5–8.1)

## 2017-05-07 LAB — I-STAT CG4 LACTIC ACID, ED: LACTIC ACID, VENOUS: 0.98 mmol/L (ref 0.5–1.9)

## 2017-05-07 MED ORDER — CEPHALEXIN 500 MG PO CAPS: 1000.0000 mg | ORAL_CAPSULE | Freq: Two times a day (BID) | ORAL | 0 refills | Status: DC

## 2017-05-07 MED ORDER — CEPHALEXIN 250 MG PO CAPS
1000.0000 mg | ORAL_CAPSULE | Freq: Once | ORAL | Status: AC
  Administered 2017-05-07: 1000 mg via ORAL
  Filled 2017-05-07: qty 4

## 2017-05-07 NOTE — Discharge Instructions (Signed)
1.  Elevate your leg as much as possible. 2.  Call wound care clinic to set up an appointment for dressing changes. 3.  Take your antibiotics as prescribed. 4.  Return to the emergency department if you get worsening pain, swelling, fever or generally feeling ill. 5.  Make a follow-up appointment with your family doctor soon as possible.

## 2017-05-07 NOTE — ED Triage Notes (Addendum)
Pt arrives From MD office with c/o cellulitis. Pt fell 3 weeks ago with resulting injury to left lower leg.Swelling noted over time with open wound and weeping of fluid. Now warm to touch and swollen. Painfull. Pt also c/ocold symtoms and congestion over last 2 weeks.

## 2017-05-07 NOTE — ED Provider Notes (Signed)
[facility] EMERGENCY DEPARTMENT Provider Note   CSN: [id number] Arrival date & time: 05/07/17  1635     History   Chief Complaint Chief Complaint  Patient presents with  . Leg Swelling    HPI Mindy Taylor is a 83 y.o. female.  HPI Patient hit her leg 2 weeks ago.  She reports that she was stepping up a curb in the parking lot and did not quite make it causing her to fall and hit her lower left leg.  She reports she got an area of very firm swelling on the lower outer leg.  She was seen at that time and it was diagnosed as a hematoma.  She has been keeping it elevated and wrapped as instructed.  She reports she has been bearing weight on it without difficulty.  She reports however she removed the dressing yesterday and the top layer of skin peeled off.  She reports that now she has increased redness and tenderness in that area.  She went to her PCP office and there was concern for cellulitis and she was referred to the emergency department.  Patient denies she had fever, chills, nausea or general malaise.  She reports she feels well otherwise.  Denies she really needs anything for pain.  Ports she is getting most worried because it was getting more red and swollen and it was suggested that it could be infected that she needed antibiotics.  Patient reports her listed allergy to Ceftin is not really an allergy.  Reports at one point she took 1 pill that was prescribed to her and felt kind of funny and then did not take anymore.  He has not had any problems with any other antibiotics prescribed to her.  Review of EMR shows this includes Rocephin and Keflex. Past Medical History:  Diagnosis Date  . ANXIETY 03/05/2007  . ATAXIA 12/26/2009  . BEE STING 03/05/2007  . BRONCHITIS, ACUTE 06/28/2010  . CANDIDIASIS, VAGINAL 09/08/2008  . CEREBROVASCULAR ACCIDENT, HX OF 01/04/2010  . CERUMEN IMPACTION 06/05/2008  . DIZZINESS 12/26/2009  . Epistaxis 06/05/2008  . Headache(784.0)  06/05/2008  . HYPERLIPIDEMIA 12/08/2006  . HYPERTENSION 12/08/2006  . 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 Multiple myeloma ([place]) 04/03/2015  . NECK PAIN 06/05/2008  . OBESITY 09/07/2009  . ONYCHOMYCOSIS 03/09/2009  . OSTEOARTHRITIS 12/08/2006  . PARESTHESIA 12/26/2009  . TOBACCO USE, QUIT 03/09/2009    Patient Active Problem List   Diagnosis Date Noted  . Contusion of left leg 04/20/2017  . Sore in nose 06/03/2016  . Cough 05/22/2016  . HLD (hyperlipidemia)   . Cellulitis 12/24/2015  . Cellulitis of left upper extremity 12/23/2015  . Well adult exam 11/19/2015  . Loss of weight 10/12/2015  . Normocytic anemia 05/07/2015  . Multiple myeloma ([place]) 04/03/2015  . Arthralgia 02/01/2015  . Right wrist pain 12/01/2014  . Arm pain, left 11/22/2014  . No blood products 03/11/2014  . Right knee pain 07/26/2013  . Chest pain 04/26/2013  . Left ankle swelling 03/04/2013  . Clostridium difficile diarrhea 10/22/2012  . Right hand pain 09/08/2012  . Elbow pain, right 03/15/2012  . Knee pain, left 10/02/2011  . Hypercalcemia 10/02/2011  . Hyperglycemia 08/08/2011  . Neck pain, chronic 05/05/2011  . Knee pain, bilateral 02/18/2011  . Fatigue 01/03/2011  . Bronchitis, acute, with bronchospasm 12/11/2010  . Wheezing 11/28/2010  . Preventative health care 11/28/2010  . Cerebral artery occlusion with cerebral infarction ([place]) 01/04/2010  . CVA (cerebral vascular accident) ([place])  01/04/2010  . DIZZINESS 12/26/2009  . ATAXIA 12/26/2009  . PARESTHESIA 12/26/2009  . Morbid obesity due to excess calories ([place]) 09/07/2009  . ONYCHOMYCOSIS 03/09/2009  . TOBACCO USE, QUIT 03/09/2009  . CANDIDIASIS, VAGINAL 09/08/2008  . CERUMEN IMPACTION 06/05/2008  . Headache(784.0) 06/05/2008  . Epistaxis 06/05/2008  . Anxiety state 03/05/2007  . BEE STING 03/05/2007  . Dyslipidemia 12/08/2006  . Essential hypertension 12/08/2006  . Osteoarthritis 12/08/2006    Past Surgical History:  Procedure Laterality Date   . ABDOMINAL HYSTERECTOMY    . BREAST SURGERY      OB History    No data available       Home Medications    Prior to Admission medications   Medication Sig Start Date End Date Taking? Authorizing Provider  amLODipine (NORVASC) 5 MG tablet TAKE 1 TABLET DAILY 10/03/16   Plotnikov, Evie Lacks, MD  azithromycin (ZITHROMAX Z-PAK) 250 MG tablet As directed 04/20/17   Plotnikov, Evie Lacks, MD  cephALEXin (KEFLEX) 500 MG capsule Take 2 capsules (1,000 mg total) by mouth 2 (two) times daily. 05/07/17   Charlesetta Shanks, MD  Cholecalciferol (VITAMIN D) 1000 UNITS capsule Take 1 capsule (1,000 Units total) by mouth daily. 01/03/11   Plotnikov, Evie Lacks, MD  fish oil-omega-3 fatty acids 1000 MG capsule Take 1 g by mouth daily.      [provider]  HYDROcodone-acetaminophen (NORCO/VICODIN) 5-325 MG tablet Take 0.5-1 tablets by mouth every 6 (six) hours as needed for severe pain. 10/12/15   Plotnikov, Evie Lacks, MD  losartan (COZAAR) 100 MG tablet TAKE 1 TABLET EVERY EVENING 04/23/17   Hoyt Koch, MD  metoprolol succinate (TOPROL-XL) 50 MG 24 hr tablet Take 1 tablet (50 mg total) by mouth 2 (two) times daily. 03/03/17   Plotnikov, Evie Lacks, MD  Multiple Vitamin (MULTIVITAMIN) tablet Take 1 tablet by mouth daily.      [provider]  pravastatin (PRAVACHOL) 40 MG tablet Take 1 tablet (40 mg total) by mouth daily. 12/24/16   Plotnikov, Evie Lacks, MD  promethazine-codeine (PHENERGAN WITH CODEINE) 6.25-10 MG/5ML syrup Take 5 mLs by mouth every 4 (four) hours as needed. 04/20/17   Plotnikov, Evie Lacks, MD  vitamin C (ASCORBIC ACID) 500 MG tablet Take 500 mg by mouth daily.      [provider]    Family History Family History  Problem Relation Age of Onset  . Hypertension Mother   . Hypertension Father   . Hypertension Other     Social History Social History   Tobacco Use  . Smoking status: Former Research scientist (life sciences)  . Smokeless tobacco: Never Used  . Tobacco comment:  Not sure when; many, many years   Substance Use Topics  . Alcohol use: No    Alcohol/week: 0.0 oz  . Drug use: No     Allergies   Bee venom; Ceftin [cefuroxime axetil]; and Spider antivenin [black widow spider antivenin (l.mactans)]   Review of Systems Review of Systems 10 Systems reviewed and are negative for acute change except as noted in the HPI.   Physical Exam Updated Vital Signs BP 136/74   Pulse 81   Temp 98.3 F (36.8 C) (Oral)   Resp 17   Ht '5\' 6"'$  (1.676 m)   Wt 93.4 kg (206 lb)   SpO2 100%   BMI 33.25 kg/m   Physical Exam  Constitutional: She is oriented to person, place, and time. She appears well-developed and well-nourished. No distress.  Patient is clinically well in appearance.  Were alert and interactive.  HENT:  Head: Normocephalic and atraumatic.  Eyes: EOM are normal.  Cardiovascular: Normal rate, regular rhythm, normal heart sounds and intact distal pulses.  Pulmonary/Chest: Effort normal and breath sounds normal.  Abdominal: Soft. She exhibits no distension. There is no tenderness.  Musculoskeletal:  Patient has edema of the left lower leg that extends from area of her injury to the foot.  She has superficial ulceration over area of most swelling on the lateral leg.  This is consistent with hematoma that has sloughed superficial skin layer.  There is warmth to the touch.  The image.  Neurological: She is alert and oriented to person, place, and time. No cranial nerve deficit. She exhibits normal muscle tone. Coordination normal.  Skin: Skin is warm and dry.             ED Treatments / Results  Labs (all labs ordered are listed, but only abnormal results are displayed) Labs Reviewed  COMPREHENSIVE METABOLIC PANEL - Abnormal; Notable for the following components:      Result Value   Potassium 3.3 (*)    Glucose, Bld 108 (*)    Total Protein 9.4 (*)    Albumin 2.8 (*)    GFR calc non Af Amer 57 (*)    Anion gap 3 (*)    All other  components within normal limits  CBC WITH DIFFERENTIAL/PLATELET - Abnormal; Notable for the following components:   RBC 2.62 (*)    Hemoglobin 8.4 (*)    HCT 25.6 (*)    All other components within normal limits  URINALYSIS, ROUTINE W REFLEX MICROSCOPIC  I-STAT CG4 LACTIC ACID, ED    EKG  EKG Interpretation None       Radiology Dg Chest 2 View  Result Date: 05/07/2017 CLINICAL DATA:  83 year old female presents with leg swelling and cough after fall yesterday. Former smoker. EXAM: CHEST  2 VIEW COMPARISON:  05/22/2016 FINDINGS: Heart is top normal. There is mild uncoiling of the thoracic aorta without aneurysm. Lungs are clear. No acute osseous abnormality. Degenerative changes are seen along the thoracic spine and both shoulders. IMPRESSION: No active cardiopulmonary disease. Electronically Signed   By: Ashley Royalty M.D.   On: 05/07/2017 17:29    Procedures Procedures (including critical care time)  Medications Ordered in ED Medications  cephALEXin (KEFLEX) capsule 1,000 mg (1,000 mg Oral Given 05/07/17 2033)     Initial Impression / Assessment and Plan / ED Course  I have reviewed the triage vital signs and the nursing notes.  Pertinent labs & imaging results that were available during my care of the patient were reviewed by me and considered in my medical decision making (see chart for details).     Final Clinical Impressions(s) / ED Diagnoses   Final diagnoses:  Cellulitis of left lower extremity  Hematoma   Patient had hematoma that was traumatic 2 weeks ago.  At this point, she has developed poor healing with sloughing of the superficial skin.  There is warmth and edema.  This may represent secondary cellulitis.  Patient will be started on Keflex.  Patient is counseled on follow-up with wound care clinic for dressing changes and monitoring of the wound.  She is also counseled to follow-up with her PCP.  Patient is clinically well.  She denies any difficulty  ambulating on the leg.  No signs of constitutional illness.  Will initiate outpatient treatment. ED Discharge Orders        Ordered  cephALEXin (KEFLEX) 500 MG capsule  2 times daily     05/07/17 2038       Charlesetta Shanks, MD 05/07/17 2049

## 2017-05-07 NOTE — Patient Instructions (Signed)
Please go to [facility] ER as we discussed.

## 2017-05-07 NOTE — Progress Notes (Signed)
Mindy Taylor is a 83 y.o. female with the following history as recorded in EpicCare:  Patient Active Problem List   Diagnosis Date Noted  . Contusion of left leg 04/20/2017  . Sore in nose 06/03/2016  . Cough 05/22/2016  . HLD (hyperlipidemia)   . Cellulitis 12/24/2015  . Cellulitis of left upper extremity 12/23/2015  . Well adult exam 11/19/2015  . Loss of weight 10/12/2015  . Normocytic anemia 05/07/2015  . Multiple myeloma ([place]) 04/03/2015  . Arthralgia 02/01/2015  . Right wrist pain 12/01/2014  . Arm pain, left 11/22/2014  . No blood products 03/11/2014  . Right knee pain 07/26/2013  . Chest pain 04/26/2013  . Left ankle swelling 03/04/2013  . Clostridium difficile diarrhea 10/22/2012  . Right hand pain 09/08/2012  . Elbow pain, right 03/15/2012  . Knee pain, left 10/02/2011  . Hypercalcemia 10/02/2011  . Hyperglycemia 08/08/2011  . Neck pain, chronic 05/05/2011  . Knee pain, bilateral 02/18/2011  . Fatigue 01/03/2011  . Bronchitis, acute, with bronchospasm 12/11/2010  . Wheezing 11/28/2010  . Preventative health care 11/28/2010  . Cerebral artery occlusion with cerebral infarction ([place]) 01/04/2010  . CVA (cerebral vascular accident) ([place]) 01/04/2010  . DIZZINESS 12/26/2009  . ATAXIA 12/26/2009  . PARESTHESIA 12/26/2009  . Morbid obesity due to excess calories ([place]) 09/07/2009  . ONYCHOMYCOSIS 03/09/2009  . TOBACCO USE, QUIT 03/09/2009  . CANDIDIASIS, VAGINAL 09/08/2008  . CERUMEN IMPACTION 06/05/2008  . Headache(784.0) 06/05/2008  . Epistaxis 06/05/2008  . Anxiety state 03/05/2007  . BEE STING 03/05/2007  . Dyslipidemia 12/08/2006  . Essential hypertension 12/08/2006  . Osteoarthritis 12/08/2006    Current Outpatient Medications  Medication Sig Dispense Refill  . amLODipine (NORVASC) 5 MG tablet TAKE 1 TABLET DAILY 90 tablet 2  . azithromycin (ZITHROMAX Z-PAK) 250 MG tablet As directed 6 each 0  . Cholecalciferol (VITAMIN D) 1000 UNITS capsule Take 1  capsule (1,000 Units total) by mouth daily. 100 capsule 3  . fish oil-omega-3 fatty acids 1000 MG capsule Take 1 g by mouth daily.      [name] HYDROcodone-acetaminophen (NORCO/VICODIN) 5-325 MG tablet Take 0.5-1 tablets by mouth every 6 (six) hours as needed for severe pain. 90 tablet 0  . losartan (COZAAR) 100 MG tablet TAKE 1 TABLET EVERY EVENING 90 tablet 2  . metoprolol succinate (TOPROL-XL) 50 MG 24 hr tablet Take 1 tablet (50 mg total) by mouth 2 (two) times daily. 180 tablet 1  . Multiple Vitamin (MULTIVITAMIN) tablet Take 1 tablet by mouth daily.      . pravastatin (PRAVACHOL) 40 MG tablet Take 1 tablet (40 mg total) by mouth daily. 90 tablet 2  . promethazine-codeine (PHENERGAN WITH CODEINE) 6.25-10 MG/5ML syrup Take 5 mLs by mouth every 4 (four) hours as needed. 300 mL 0  . vitamin C (ASCORBIC ACID) 500 MG tablet Take 500 mg by mouth daily.       No current facility-administered medications for this visit.     Allergies: Bee venom; Ceftin [cefuroxime axetil]; and Spider antivenin [black widow spider antivenin (l.mactans)]  Past Medical History:  Diagnosis Date  . ANXIETY 03/05/2007  . ATAXIA 12/26/2009  . BEE STING 03/05/2007  . BRONCHITIS, ACUTE 06/28/2010  . CANDIDIASIS, VAGINAL 09/08/2008  . CEREBROVASCULAR ACCIDENT, HX OF 01/04/2010  . CERUMEN IMPACTION 06/05/2008  . DIZZINESS 12/26/2009  . Epistaxis 06/05/2008  . Headache(784.0) 06/05/2008  . HYPERLIPIDEMIA 12/08/2006  . HYPERTENSION 12/08/2006  . 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 Multiple myeloma ([place]) 04/03/2015  . NECK  PAIN 06/05/2008  . OBESITY 09/07/2009  . ONYCHOMYCOSIS 03/09/2009  . OSTEOARTHRITIS 12/08/2006  . PARESTHESIA 12/26/2009  . TOBACCO USE, QUIT 03/09/2009    Past Surgical History:  Procedure Laterality Date  . ABDOMINAL HYSTERECTOMY    . BREAST SURGERY      Family History  Problem Relation Age of Onset  . Hypertension Mother   . Hypertension Father   . Hypertension Other     Social History   Tobacco Use  .  Smoking status: Former Research scientist (life sciences)  . Smokeless tobacco: Never Used  . Tobacco comment: Not sure when; many, many years   Substance Use Topics  . Alcohol use: No    Alcohol/week: 0.0 oz    Subjective:  Patient presents with concerns for worsening pain/ swelling at site of "hematoma" that was initially treated 2 weeks ago. She had been trying to keep the area of concerned covered and wrapped with initial improvement. However, in the past 24 hours, she has developed increased swelling and pain at the site of the wound. She does feel that the skin around the wound is warm to the touch. She denies any fever.    Objective:  Vitals:   05/07/17 1259  BP: 134/82  Pulse: 81  Temp: 98.5 F (36.9 C)  TempSrc: Oral  SpO2: 98%  Weight: 206 lb (93.4 kg)  Height: '5\' 7"'$  (1.702 m)    General: Well developed, well nourished, in no acute distress  Skin : Warm and dry. Open wound noted on left lower calf with surrounding warmth and swelling Head: Normocephalic and atraumatic  Eyes: Sclera and conjunctiva clear; pupils round and reactive to light; extraocular movements intact  Ears: External normal; canals clear; tympanic membranes normal  Oropharynx: Pink, supple. No suspicious lesions  Neck: Supple without thyromegaly, adenopathy  Lungs: Respirations unlabored; clear to auscultation bilaterally without wheeze, rales, rhonchi  Extremities: + pedal edema, cyanosis, clubbing  Neurologic: Alert and oriented; speech intact; face symmetrical; moves all extremities well; CNII-XII intact without focal deficit   Assessment:  1. Cellulitis and abscess of left leg     Plan:  Based on appearance of wound and surrounding erythema/ warmth and swelling, feel that IV antibiotics beneficial; she is referred to ER for further evaluation and probable admission. ER nurse at [facility] is notified about patient.   No Follow-up on file.  No orders of the defined types were placed in this encounter.   Requested Prescriptions     No prescriptions requested or ordered in this encounter

## 2017-05-20 DIAGNOSIS — S81802A Unspecified open wound, left lower leg, initial encounter: Secondary | ICD-10-CM | POA: Diagnosis not present

## 2017-05-20 DIAGNOSIS — I83022 Varicose veins of left lower extremity with ulcer of calf: Secondary | ICD-10-CM | POA: Diagnosis not present

## 2017-05-20 DIAGNOSIS — L97222 Non-pressure chronic ulcer of left calf with fat layer exposed: Secondary | ICD-10-CM | POA: Diagnosis not present

## 2017-05-22 DIAGNOSIS — I83022 Varicose veins of left lower extremity with ulcer of calf: Secondary | ICD-10-CM | POA: Diagnosis not present

## 2017-05-22 DIAGNOSIS — L97225 Non-pressure chronic ulcer of left calf with muscle involvement without evidence of necrosis: Secondary | ICD-10-CM | POA: Diagnosis not present

## 2017-05-22 DIAGNOSIS — S8012XS Contusion of left lower leg, sequela: Secondary | ICD-10-CM | POA: Diagnosis not present

## 2017-05-27 DIAGNOSIS — C9000 Multiple myeloma not having achieved remission: Secondary | ICD-10-CM | POA: Diagnosis not present

## 2017-05-27 DIAGNOSIS — L97225 Non-pressure chronic ulcer of left calf with muscle involvement without evidence of necrosis: Secondary | ICD-10-CM | POA: Diagnosis not present

## 2017-05-28 DIAGNOSIS — M170 Bilateral primary osteoarthritis of knee: Secondary | ICD-10-CM | POA: Diagnosis not present

## 2017-05-30 ENCOUNTER — Other Ambulatory Visit: Payer: Self-pay | Admitting: Internal Medicine

## 2017-06-01 ENCOUNTER — Encounter: Payer: Self-pay | Admitting: Internal Medicine

## 2017-06-01 ENCOUNTER — Ambulatory Visit (INDEPENDENT_AMBULATORY_CARE_PROVIDER_SITE_OTHER): Payer: Medicare Other | Admitting: Internal Medicine

## 2017-06-01 DIAGNOSIS — M25562 Pain in left knee: Secondary | ICD-10-CM

## 2017-06-01 DIAGNOSIS — G4700 Insomnia, unspecified: Secondary | ICD-10-CM | POA: Diagnosis not present

## 2017-06-01 DIAGNOSIS — C9000 Multiple myeloma not having achieved remission: Secondary | ICD-10-CM | POA: Diagnosis not present

## 2017-06-01 DIAGNOSIS — M25561 Pain in right knee: Secondary | ICD-10-CM | POA: Diagnosis not present

## 2017-06-01 MED ORDER — CELECOXIB 200 MG PO CAPS: 200.0000 mg | ORAL_CAPSULE | Freq: Every day | ORAL | 2 refills | Status: DC | PRN

## 2017-06-01 NOTE — Assessment & Plan Note (Signed)
Pt refused f/u and treatment for MM

## 2017-06-01 NOTE — Patient Instructions (Signed)
Valerian root to help you sleep

## 2017-06-01 NOTE — Assessment & Plan Note (Signed)
Valerian root to help you sleep

## 2017-06-01 NOTE — Progress Notes (Signed)
Subjective:  Patient ID: Terrance Mass Ose, female    DOB: 1928-08-12  Age: 84 y.o. MRN: [id number]  CC: No chief complaint on file.   HPI Dystany L Hillis presents for MM, chronic leg wound, HTN f/u. Wound clinic suggested an NSAID for pain...  Outpatient Medications Prior to Visit  Medication Sig Dispense Refill  . amLODipine (NORVASC) 5 MG tablet TAKE 1 TABLET DAILY (Patient taking differently: Take 5 mg by mouth once a day) 90 tablet 2  . cephALEXin (KEFLEX) 500 MG capsule Take 2 capsules (1,000 mg total) by mouth 2 (two) times daily. 28 capsule 0  . Cholecalciferol (VITAMIN D) 1000 UNITS capsule Take 1 capsule (1,000 Units total) by mouth daily. 100 capsule 3  . fish oil-omega-3 fatty acids 1000 MG capsule Take 1 g by mouth daily.      [name] HYDROcodone-acetaminophen (NORCO/VICODIN) 5-325 MG tablet Take 0.5-1 tablets by mouth every 6 (six) hours as needed for severe pain. 90 tablet 0  . losartan (COZAAR) 100 MG tablet TAKE 1 TABLET EVERY EVENING (Patient taking differently: Take 100 mg by mouth in the evening) 90 tablet 2  . metoprolol succinate (TOPROL-XL) 50 MG 24 hr tablet Take 1 tablet (50 mg total) by mouth 2 (two) times daily. 180 tablet 1  . Multiple Vitamin (MULTIVITAMIN) tablet Take 1 tablet by mouth daily.      . pravastatin (PRAVACHOL) 40 MG tablet TAKE 1 TABLET DAILY 90 tablet 2  . vitamin C (ASCORBIC ACID) 500 MG tablet Take 500 mg by mouth daily.      [name] azithromycin (ZITHROMAX Z-PAK) 250 MG tablet As directed (Patient not taking: Reported on 05/07/2017) 6 each 0  . pravastatin (PRAVACHOL) 40 MG tablet Take 1 tablet (40 mg total) by mouth daily. (Patient taking differently: Take 40 mg by mouth every evening. ) 90 tablet 2  . promethazine-codeine (PHENERGAN WITH CODEINE) 6.25-10 MG/5ML syrup Take 5 mLs by mouth every 4 (four) hours as needed. (Patient taking differently: Take 5 mLs by mouth every 4 (four) hours as needed for cough. ) 300 mL 0   No facility-administered medications  prior to visit.     ROS Review of Systems  Constitutional: Positive for fatigue. Negative for activity change, appetite change, chills and unexpected weight change.  HENT: Negative for congestion, mouth sores and sinus pressure.   Eyes: Negative for visual disturbance.  Respiratory: Negative for cough and chest tightness.   Gastrointestinal: Negative for abdominal pain and nausea.  Genitourinary: Negative for difficulty urinating, frequency and vaginal pain.  Musculoskeletal: Positive for arthralgias and gait problem. Negative for back pain.  Skin: Positive for wound. Negative for pallor and rash.  Neurological: Negative for dizziness, tremors, weakness, numbness and headaches.  Psychiatric/Behavioral: Negative for confusion and sleep disturbance.    Objective:  BP 128/76 (BP Location: Right Arm, Patient Position: Sitting, Cuff Size: Large)   Pulse 86   Temp 98.3 F (36.8 C) (Oral)   Ht 5\' 6"  (1.676 m)   Wt 207 lb (93.9 kg)   SpO2 98%   BMI 33.41 kg/m   BP Readings from Last 3 Encounters:  06/01/17 128/76  05/07/17 (!) 148/73  05/07/17 134/82    Wt Readings from Last 3 Encounters:  06/01/17 207 lb (93.9 kg)  05/07/17 206 lb (93.4 kg)  05/07/17 206 lb (93.4 kg)    Physical Exam  Constitutional: She appears well-developed. No distress.  HENT:  Head: Normocephalic.  Right Ear: External ear normal.  Left Ear: External ear  normal.  Nose: Nose normal.  Mouth/Throat: Oropharynx is clear and moist.  Eyes: Conjunctivae are normal. Pupils are equal, round, and reactive to light. Right eye exhibits no discharge. Left eye exhibits no discharge.  Neck: Normal range of motion. Neck supple. No JVD present. No tracheal deviation present. No thyromegaly present.  Cardiovascular: Normal rate, regular rhythm and normal heart sounds.  Pulmonary/Chest: No stridor. No respiratory distress. She has no wheezes.  Abdominal: Soft. Bowel sounds are normal. She exhibits no distension and no  mass. There is no tenderness. There is no rebound and no guarding.  Musculoskeletal: She exhibits tenderness. She exhibits no edema.  Lymphadenopathy:    She has no cervical adenopathy.  Neurological: She displays normal reflexes. No cranial nerve deficit. She exhibits normal muscle tone. Coordination abnormal.  Skin: No rash noted. No erythema.  Psychiatric: She has a normal mood and affect. Her behavior is normal. Judgment and thought content normal.  L knee is tender Cane LLE is wrapped  Lab Results  Component Value Date   WBC 6.5 05/07/2017   HGB 8.4 (L) 05/07/2017   HCT 25.6 (L) 05/07/2017   PLT 252 05/07/2017   GLUCOSE 108 (H) 05/07/2017   CHOL 129 09/06/2012   TRIG 63.0 09/06/2012   HDL 33.90 (L) 09/06/2012   LDLCALC 83 09/06/2012   ALT 14 05/07/2017   AST 20 05/07/2017   NA 135 05/07/2017   K 3.3 (L) 05/07/2017   CL 104 05/07/2017   CREATININE 0.89 05/07/2017   BUN 10 05/07/2017   CO2 28 05/07/2017   TSH 0.58 03/21/2016   INR 1.04 03/16/2015   HGBA1C 5.7 08/11/2011    Dg Chest 2 View  Result Date: 05/07/2017 CLINICAL DATA:  84 year old female presents with leg swelling and cough after fall yesterday. Former smoker. EXAM: CHEST  2 VIEW COMPARISON:  05/22/2016 FINDINGS: Heart is top normal. There is mild uncoiling of the thoracic aorta without aneurysm. Lungs are clear. No acute osseous abnormality. Degenerative changes are seen along the thoracic spine and both shoulders. IMPRESSION: No active cardiopulmonary disease. Electronically Signed   By: Ashley Royalty M.D.   On: 05/07/2017 17:29    Assessment & Plan:   There are no diagnoses linked to this encounter. I have discontinued Sahira L. Bodley's promethazine-codeine and azithromycin. I am also having her maintain her Vitamin D, multivitamin, vitamin C, fish oil-omega-3 fatty acids, HYDROcodone-acetaminophen, amLODipine, metoprolol succinate, losartan, cephALEXin, and pravastatin.  No orders of the defined types were  placed in this encounter.    Follow-up: No Follow-up on file.  Walker Kehr, MD

## 2017-06-01 NOTE — Assessment & Plan Note (Signed)
L>R Celebrex prn pc  Potential benefits of NSAID use as well as potential risks  and complications were explained to the patient and were aknowledged.

## 2017-06-03 DIAGNOSIS — Z8589 Personal history of malignant neoplasm of other organs and systems: Secondary | ICD-10-CM | POA: Diagnosis not present

## 2017-06-03 DIAGNOSIS — L97225 Non-pressure chronic ulcer of left calf with muscle involvement without evidence of necrosis: Secondary | ICD-10-CM | POA: Diagnosis not present

## 2017-06-10 DIAGNOSIS — L97225 Non-pressure chronic ulcer of left calf with muscle involvement without evidence of necrosis: Secondary | ICD-10-CM | POA: Diagnosis not present

## 2017-06-10 DIAGNOSIS — C9000 Multiple myeloma not having achieved remission: Secondary | ICD-10-CM | POA: Diagnosis not present

## 2017-06-10 DIAGNOSIS — I83022 Varicose veins of left lower extremity with ulcer of calf: Secondary | ICD-10-CM | POA: Diagnosis not present

## 2017-06-16 DIAGNOSIS — I83022 Varicose veins of left lower extremity with ulcer of calf: Secondary | ICD-10-CM | POA: Diagnosis not present

## 2017-06-16 DIAGNOSIS — L97225 Non-pressure chronic ulcer of left calf with muscle involvement without evidence of necrosis: Secondary | ICD-10-CM | POA: Diagnosis not present

## 2017-06-22 ENCOUNTER — Encounter (HOSPITAL_BASED_OUTPATIENT_CLINIC_OR_DEPARTMENT_OTHER): Payer: Medicare Other | Attending: Internal Medicine

## 2017-06-24 DIAGNOSIS — L97225 Non-pressure chronic ulcer of left calf with muscle involvement without evidence of necrosis: Secondary | ICD-10-CM | POA: Diagnosis not present

## 2017-06-24 DIAGNOSIS — L97222 Non-pressure chronic ulcer of left calf with fat layer exposed: Secondary | ICD-10-CM | POA: Diagnosis not present

## 2017-06-24 DIAGNOSIS — I83022 Varicose veins of left lower extremity with ulcer of calf: Secondary | ICD-10-CM | POA: Diagnosis not present

## 2017-06-25 DIAGNOSIS — L97225 Non-pressure chronic ulcer of left calf with muscle involvement without evidence of necrosis: Secondary | ICD-10-CM | POA: Diagnosis not present

## 2017-06-29 DIAGNOSIS — L97222 Non-pressure chronic ulcer of left calf with fat layer exposed: Secondary | ICD-10-CM | POA: Diagnosis not present

## 2017-06-29 DIAGNOSIS — I83022 Varicose veins of left lower extremity with ulcer of calf: Secondary | ICD-10-CM | POA: Diagnosis not present

## 2017-06-30 ENCOUNTER — Other Ambulatory Visit: Payer: Self-pay | Admitting: Internal Medicine

## 2017-07-01 DIAGNOSIS — L97225 Non-pressure chronic ulcer of left calf with muscle involvement without evidence of necrosis: Secondary | ICD-10-CM | POA: Diagnosis not present

## 2017-07-01 DIAGNOSIS — I83022 Varicose veins of left lower extremity with ulcer of calf: Secondary | ICD-10-CM | POA: Diagnosis not present

## 2017-07-08 DIAGNOSIS — S81802D Unspecified open wound, left lower leg, subsequent encounter: Secondary | ICD-10-CM | POA: Diagnosis not present

## 2017-07-08 DIAGNOSIS — I83022 Varicose veins of left lower extremity with ulcer of calf: Secondary | ICD-10-CM | POA: Diagnosis not present

## 2017-07-08 DIAGNOSIS — L97222 Non-pressure chronic ulcer of left calf with fat layer exposed: Secondary | ICD-10-CM | POA: Diagnosis not present

## 2017-07-10 ENCOUNTER — Ambulatory Visit: Payer: Medicare Other | Admitting: Internal Medicine

## 2017-07-15 DIAGNOSIS — L97222 Non-pressure chronic ulcer of left calf with fat layer exposed: Secondary | ICD-10-CM | POA: Diagnosis not present

## 2017-07-15 DIAGNOSIS — I83022 Varicose veins of left lower extremity with ulcer of calf: Secondary | ICD-10-CM | POA: Diagnosis not present

## 2017-07-15 DIAGNOSIS — L97225 Non-pressure chronic ulcer of left calf with muscle involvement without evidence of necrosis: Secondary | ICD-10-CM | POA: Diagnosis not present

## 2017-07-22 DIAGNOSIS — I83022 Varicose veins of left lower extremity with ulcer of calf: Secondary | ICD-10-CM | POA: Diagnosis not present

## 2017-07-22 DIAGNOSIS — L97225 Non-pressure chronic ulcer of left calf with muscle involvement without evidence of necrosis: Secondary | ICD-10-CM | POA: Diagnosis not present

## 2017-07-22 DIAGNOSIS — I87312 Chronic venous hypertension (idiopathic) with ulcer of left lower extremity: Secondary | ICD-10-CM | POA: Diagnosis not present

## 2017-07-29 DIAGNOSIS — L97225 Non-pressure chronic ulcer of left calf with muscle involvement without evidence of necrosis: Secondary | ICD-10-CM | POA: Diagnosis not present

## 2017-07-29 DIAGNOSIS — L97222 Non-pressure chronic ulcer of left calf with fat layer exposed: Secondary | ICD-10-CM | POA: Diagnosis not present

## 2017-07-29 DIAGNOSIS — I83022 Varicose veins of left lower extremity with ulcer of calf: Secondary | ICD-10-CM | POA: Diagnosis not present

## 2017-08-05 DIAGNOSIS — I83022 Varicose veins of left lower extremity with ulcer of calf: Secondary | ICD-10-CM | POA: Diagnosis not present

## 2017-08-05 DIAGNOSIS — L97222 Non-pressure chronic ulcer of left calf with fat layer exposed: Secondary | ICD-10-CM | POA: Diagnosis not present

## 2017-08-05 DIAGNOSIS — L97225 Non-pressure chronic ulcer of left calf with muscle involvement without evidence of necrosis: Secondary | ICD-10-CM | POA: Diagnosis not present

## 2017-08-06 ENCOUNTER — Telehealth: Payer: Self-pay | Admitting: Internal Medicine

## 2017-08-06 MED ORDER — OLMESARTAN MEDOXOMIL 40 MG PO TABS: 40.0000 mg | ORAL_TABLET | Freq: Every day | ORAL | 0 refills | Status: DC

## 2017-08-06 NOTE — Telephone Encounter (Signed)
Rx for olmesartan sent in. Take 1 pill daily.

## 2017-08-06 NOTE — Telephone Encounter (Signed)
MD out of office pls advise.../lmb 

## 2017-08-06 NOTE — Telephone Encounter (Signed)
Copied from [place] [id number]. Topic: Quick Communication - Rx Refill/Question >> Aug 06, 2017  1:47 PM Margot Ables wrote: Medication: losartan (COZAAR) 100 MG tablet - requesting med change due to recall Has the patient contacted their pharmacy? Yes.  Recd letter that her lot was affected Preferred Pharmacy (with phone number or street name): [place], [place] [phone] (Phone) [phone] (Fax)

## 2017-08-06 NOTE — Telephone Encounter (Signed)
Notified pt of med change new rx sent to mail order.Marland KitchenJohny Taylor

## 2017-08-12 DIAGNOSIS — L97222 Non-pressure chronic ulcer of left calf with fat layer exposed: Secondary | ICD-10-CM | POA: Diagnosis not present

## 2017-08-12 DIAGNOSIS — I83022 Varicose veins of left lower extremity with ulcer of calf: Secondary | ICD-10-CM | POA: Diagnosis not present

## 2017-08-12 DIAGNOSIS — L97225 Non-pressure chronic ulcer of left calf with muscle involvement without evidence of necrosis: Secondary | ICD-10-CM | POA: Diagnosis not present

## 2017-08-14 DIAGNOSIS — M170 Bilateral primary osteoarthritis of knee: Secondary | ICD-10-CM | POA: Diagnosis not present

## 2017-08-19 DIAGNOSIS — I872 Venous insufficiency (chronic) (peripheral): Secondary | ICD-10-CM | POA: Diagnosis not present

## 2017-08-19 DIAGNOSIS — I8392 Asymptomatic varicose veins of left lower extremity: Secondary | ICD-10-CM | POA: Diagnosis not present

## 2017-08-30 ENCOUNTER — Other Ambulatory Visit: Payer: Self-pay | Admitting: Internal Medicine

## 2017-09-04 ENCOUNTER — Ambulatory Visit (INDEPENDENT_AMBULATORY_CARE_PROVIDER_SITE_OTHER): Payer: Medicare Other | Admitting: Internal Medicine

## 2017-09-04 ENCOUNTER — Other Ambulatory Visit (INDEPENDENT_AMBULATORY_CARE_PROVIDER_SITE_OTHER): Payer: Medicare Other

## 2017-09-04 ENCOUNTER — Encounter: Payer: Self-pay | Admitting: Internal Medicine

## 2017-09-04 DIAGNOSIS — I10 Essential (primary) hypertension: Secondary | ICD-10-CM | POA: Diagnosis not present

## 2017-09-04 DIAGNOSIS — D649 Anemia, unspecified: Secondary | ICD-10-CM

## 2017-09-04 DIAGNOSIS — I63519 Cerebral infarction due to unspecified occlusion or stenosis of unspecified middle cerebral artery: Secondary | ICD-10-CM | POA: Diagnosis not present

## 2017-09-04 DIAGNOSIS — C9000 Multiple myeloma not having achieved remission: Secondary | ICD-10-CM

## 2017-09-04 DIAGNOSIS — M79602 Pain in left arm: Secondary | ICD-10-CM | POA: Diagnosis not present

## 2017-09-04 LAB — CBC WITH DIFFERENTIAL/PLATELET
BASOS ABS: 0 10*3/uL (ref 0.0–0.1)
Basophils Relative: 0.4 % (ref 0.0–3.0)
EOS ABS: 0.1 10*3/uL (ref 0.0–0.7)
Eosinophils Relative: 2.1 % (ref 0.0–5.0)
Hemoglobin: 7.8 g/dL — CL (ref 12.0–15.0)
LYMPHS PCT: 36.2 % (ref 12.0–46.0)
Lymphs Abs: 2.2 10*3/uL (ref 0.7–4.0)
MCHC: 34 g/dL (ref 30.0–36.0)
MCV: 100.1 fl — ABNORMAL HIGH (ref 78.0–100.0)
Monocytes Absolute: 0.7 10*3/uL (ref 0.1–1.0)
Monocytes Relative: 11 % (ref 3.0–12.0)
Neutro Abs: 3.1 10*3/uL (ref 1.4–7.7)
Neutrophils Relative %: 50.3 % (ref 43.0–77.0)
PLATELETS: 220 10*3/uL (ref 150.0–400.0)
RDW: 17.3 % — ABNORMAL HIGH (ref 11.5–15.5)
WBC: 6.2 10*3/uL (ref 4.0–10.5)

## 2017-09-04 LAB — BASIC METABOLIC PANEL
BUN: 23 mg/dL (ref 6–23)
CHLORIDE: 106 meq/L (ref 96–112)
CO2: 30 mEq/L (ref 19–32)
Calcium: 9.5 mg/dL (ref 8.4–10.5)
Creatinine, Ser: 1 mg/dL (ref 0.40–1.20)
GFR: 67.41 mL/min (ref >60.00)
Glucose, Bld: 100 mg/dL — ABNORMAL HIGH (ref 70–99)
POTASSIUM: 3.8 meq/L (ref 3.5–5.1)
Sodium: 137 mEq/L (ref 135–145)

## 2017-09-04 LAB — SEDIMENTATION RATE: SED RATE: 19 mm/h (ref 0–30)

## 2017-09-04 MED ORDER — OLMESARTAN MEDOXOMIL 40 MG PO TABS: 40.0000 mg | ORAL_TABLET | Freq: Every day | ORAL | 3 refills | Status: DC

## 2017-09-04 NOTE — Assessment & Plan Note (Signed)
Norco prn  Advil prn X ray if needed - pt declined

## 2017-09-04 NOTE — Progress Notes (Signed)
Subjective:  Patient ID: Mindy Taylor, female    DOB: Jan 14, 1929  Age: 84 y.o. MRN: [id number]  CC: No chief complaint on file.   HPI Walker L Digioia presents for HTN, MM, dyslipidemia f/u  Outpatient Medications Prior to Visit  Medication Sig Dispense Refill  . amLODipine (NORVASC) 5 MG tablet TAKE 1 TABLET DAILY 90 tablet 2  . celecoxib (CELEBREX) 200 MG capsule Take 1 capsule (200 mg total) by mouth daily as needed (take pc). 30 capsule 2  . cephALEXin (KEFLEX) 500 MG capsule Take 2 capsules (1,000 mg total) by mouth 2 (two) times daily. 28 capsule 0  . Cholecalciferol (VITAMIN D) 1000 UNITS capsule Take 1 capsule (1,000 Units total) by mouth daily. 100 capsule 3  . fish oil-omega-3 fatty acids 1000 MG capsule Take 1 g by mouth daily.      [name] HYDROcodone-acetaminophen (NORCO/VICODIN) 5-325 MG tablet Take 0.5-1 tablets by mouth every 6 (six) hours as needed for severe pain. 90 tablet 0  . Multiple Vitamin (MULTIVITAMIN) tablet Take 1 tablet by mouth daily.      [name] olmesartan (BENICAR) 40 MG tablet Take 1 tablet (40 mg total) by mouth daily. 90 tablet 0  . pravastatin (PRAVACHOL) 40 MG tablet TAKE 1 TABLET DAILY 90 tablet 2  . TOPROL XL 50 MG 24 hr tablet TAKE 1 TABLET TWICE A DAY 180 tablet 3  . vitamin C (ASCORBIC ACID) 500 MG tablet Take 500 mg by mouth daily.       No facility-administered medications prior to visit.     ROS Review of Systems  Constitutional: Positive for fatigue. Negative for activity change, appetite change, chills and unexpected weight change.  HENT: Negative for congestion, mouth sores and sinus pressure.   Eyes: Negative for visual disturbance.  Respiratory: Negative for cough and chest tightness.   Gastrointestinal: Negative for abdominal pain and nausea.  Genitourinary: Negative for difficulty urinating, frequency and vaginal pain.  Musculoskeletal: Positive for arthralgias, back pain and gait problem.  Skin: Negative for pallor and rash.    Neurological: Negative for dizziness, tremors, weakness, numbness and headaches.  Psychiatric/Behavioral: Negative for confusion, sleep disturbance and suicidal ideas.    Objective:  There were no vitals taken for this visit.  BP Readings from Last 3 Encounters:  06/01/17 128/76  05/07/17 (!) 148/73  05/07/17 134/82    Wt Readings from Last 3 Encounters:  06/01/17 207 lb (93.9 kg)  05/07/17 206 lb (93.4 kg)  05/07/17 206 lb (93.4 kg)    Physical Exam  Constitutional: She appears well-developed. No distress.  HENT:  Head: Normocephalic.  Right Ear: External ear normal.  Left Ear: External ear normal.  Nose: Nose normal.  Mouth/Throat: Oropharynx is clear and moist.  Eyes: Pupils are equal, round, and reactive to light. Conjunctivae are normal. Right eye exhibits no discharge. Left eye exhibits no discharge.  Neck: Normal range of motion. Neck supple. No JVD present. No tracheal deviation present. No thyromegaly present.  Cardiovascular: Normal rate, regular rhythm and normal heart sounds.  Pulmonary/Chest: No stridor. No respiratory distress. She has no wheezes.  Abdominal: Soft. Bowel sounds are normal. She exhibits no distension and no mass. There is no tenderness. There is no rebound and no guarding.  Musculoskeletal: She exhibits no edema or tenderness.  Lymphadenopathy:    She has no cervical adenopathy.  Neurological: She displays normal reflexes. No cranial nerve deficit. She exhibits normal muscle tone. Coordination normal.  Skin: No rash noted. No erythema.  Psychiatric: She has a normal mood and affect. Her behavior is normal. Judgment and thought content normal.    Lab Results  Component Value Date   WBC 6.5 05/07/2017   HGB 8.4 (L) 05/07/2017   HCT 25.6 (L) 05/07/2017   PLT 252 05/07/2017   GLUCOSE 108 (H) 05/07/2017   CHOL 129 09/06/2012   TRIG 63.0 09/06/2012   HDL 33.90 (L) 09/06/2012   LDLCALC 83 09/06/2012   ALT 14 05/07/2017   AST 20 05/07/2017    NA 135 05/07/2017   K 3.3 (L) 05/07/2017   CL 104 05/07/2017   CREATININE 0.89 05/07/2017   BUN 10 05/07/2017   CO2 28 05/07/2017   TSH 0.58 03/21/2016   INR 1.04 03/16/2015   HGBA1C 5.7 08/11/2011    Dg Chest 2 View  Result Date: 05/07/2017 CLINICAL DATA:  84 year old female presents with leg swelling and cough after fall yesterday. Former smoker. EXAM: CHEST  2 VIEW COMPARISON:  05/22/2016 FINDINGS: Heart is top normal. There is mild uncoiling of the thoracic aorta without aneurysm. Lungs are clear. No acute osseous abnormality. Degenerative changes are seen along the thoracic spine and both shoulders. IMPRESSION: No active cardiopulmonary disease. Electronically Signed   By: Ashley Royalty M.D.   On: 05/07/2017 17:29    Assessment & Plan:   There are no diagnoses linked to this encounter. I am having Chemeka L. Lightner maintain her Vitamin D, multivitamin, vitamin C, fish oil-omega-3 fatty acids, HYDROcodone-acetaminophen, cephALEXin, pravastatin, celecoxib, amLODipine, olmesartan, and TOPROL XL.  No orders of the defined types were placed in this encounter.    Follow-up: No follow-ups on file.  Walker Kehr, MD

## 2017-09-04 NOTE — Assessment & Plan Note (Signed)
Pt declined treatment.

## 2017-09-04 NOTE — Assessment & Plan Note (Signed)
Metoprolol, Amlodipine, Losartan - d/c; Benicar

## 2017-09-04 NOTE — Assessment & Plan Note (Signed)
No relapse ASA Metoprolol, Amlodipine, Losartan - d/c; Benicar

## 2017-09-04 NOTE — Assessment & Plan Note (Signed)
CBC

## 2017-09-30 DIAGNOSIS — H04123 Dry eye syndrome of bilateral lacrimal glands: Secondary | ICD-10-CM | POA: Diagnosis not present

## 2017-09-30 DIAGNOSIS — H43392 Other vitreous opacities, left eye: Secondary | ICD-10-CM | POA: Diagnosis not present

## 2017-09-30 DIAGNOSIS — H524 Presbyopia: Secondary | ICD-10-CM | POA: Diagnosis not present

## 2017-09-30 DIAGNOSIS — H3563 Retinal hemorrhage, bilateral: Secondary | ICD-10-CM | POA: Diagnosis not present

## 2017-09-30 DIAGNOSIS — H52223 Regular astigmatism, bilateral: Secondary | ICD-10-CM | POA: Diagnosis not present

## 2017-09-30 DIAGNOSIS — H2513 Age-related nuclear cataract, bilateral: Secondary | ICD-10-CM | POA: Diagnosis not present

## 2017-09-30 DIAGNOSIS — H40013 Open angle with borderline findings, low risk, bilateral: Secondary | ICD-10-CM | POA: Diagnosis not present

## 2017-09-30 DIAGNOSIS — H5203 Hypermetropia, bilateral: Secondary | ICD-10-CM | POA: Diagnosis not present

## 2017-10-12 ENCOUNTER — Telehealth: Payer: Self-pay | Admitting: Internal Medicine

## 2017-10-12 MED ORDER — LORAZEPAM 0.5 MG PO TABS: 0.5000 mg | ORAL_TABLET | Freq: Two times a day (BID) | ORAL | 1 refills | Status: DC | PRN

## 2017-10-12 NOTE — Telephone Encounter (Signed)
Copied from [place] [id number]. Topic: Quick Communication - See Telephone Encounter >> Oct 12, 2017  2:33 PM Bea Graff, NT wrote: CRM for notification. See Telephone encounter for: 10/12/17. Pt would like to see if Dr. Alain Marion can order her something for panic attacks? She states he mentioned this awhile back and she denied them but would like to try them.

## 2017-10-12 NOTE — Telephone Encounter (Signed)
Sent Rx to [facility] Thx

## 2017-10-12 NOTE — Telephone Encounter (Signed)
Please advise 

## 2017-10-26 DIAGNOSIS — H35373 Puckering of macula, bilateral: Secondary | ICD-10-CM | POA: Diagnosis not present

## 2017-10-26 DIAGNOSIS — H35033 Hypertensive retinopathy, bilateral: Secondary | ICD-10-CM | POA: Diagnosis not present

## 2017-10-26 DIAGNOSIS — H3563 Retinal hemorrhage, bilateral: Secondary | ICD-10-CM | POA: Diagnosis not present

## 2017-10-26 DIAGNOSIS — H43813 Vitreous degeneration, bilateral: Secondary | ICD-10-CM | POA: Diagnosis not present

## 2017-11-03 ENCOUNTER — Emergency Department (HOSPITAL_BASED_OUTPATIENT_CLINIC_OR_DEPARTMENT_OTHER)
Admit: 2017-11-03 | Discharge: 2017-11-03 | Disposition: A | Discharge status: Home / Self Care | Payer: Medicare Other | Attending: Emergency Medicine | Admitting: Emergency Medicine

## 2017-11-03 ENCOUNTER — Other Ambulatory Visit: Payer: Self-pay

## 2017-11-03 ENCOUNTER — Encounter (HOSPITAL_COMMUNITY): Payer: Self-pay

## 2017-11-03 ENCOUNTER — Emergency Department (HOSPITAL_COMMUNITY): Payer: Medicare Other

## 2017-11-03 ENCOUNTER — Inpatient Hospital Stay (HOSPITAL_COMMUNITY)
Admission: EM | Admit: 2017-11-03 | Discharge: 2017-11-16 | DRG: 840 | Disposition: A | Discharge status: Skilled Nursing Facility | Payer: Medicare Other | Attending: Internal Medicine | Admitting: Internal Medicine

## 2017-11-03 DIAGNOSIS — Z8249 Family history of ischemic heart disease and other diseases of the circulatory system: Secondary | ICD-10-CM

## 2017-11-03 DIAGNOSIS — C9000 Multiple myeloma not having achieved remission: Principal | ICD-10-CM | POA: Diagnosis present

## 2017-11-03 DIAGNOSIS — Z6830 Body mass index (BMI) 30.0-30.9, adult: Secondary | ICD-10-CM

## 2017-11-03 DIAGNOSIS — N179 Acute kidney failure, unspecified: Secondary | ICD-10-CM | POA: Diagnosis not present

## 2017-11-03 DIAGNOSIS — D630 Anemia in neoplastic disease: Secondary | ICD-10-CM | POA: Diagnosis present

## 2017-11-03 DIAGNOSIS — E871 Hypo-osmolality and hyponatremia: Secondary | ICD-10-CM | POA: Diagnosis not present

## 2017-11-03 DIAGNOSIS — R609 Edema, unspecified: Secondary | ICD-10-CM | POA: Diagnosis not present

## 2017-11-03 DIAGNOSIS — K31819 Angiodysplasia of stomach and duodenum without bleeding: Secondary | ICD-10-CM | POA: Diagnosis present

## 2017-11-03 DIAGNOSIS — F05 Delirium due to known physiological condition: Secondary | ICD-10-CM | POA: Diagnosis present

## 2017-11-03 DIAGNOSIS — R319 Hematuria, unspecified: Secondary | ICD-10-CM

## 2017-11-03 DIAGNOSIS — G9341 Metabolic encephalopathy: Secondary | ICD-10-CM | POA: Diagnosis not present

## 2017-11-03 DIAGNOSIS — K922 Gastrointestinal hemorrhage, unspecified: Secondary | ICD-10-CM

## 2017-11-03 DIAGNOSIS — E860 Dehydration: Secondary | ICD-10-CM | POA: Diagnosis not present

## 2017-11-03 DIAGNOSIS — R531 Weakness: Secondary | ICD-10-CM

## 2017-11-03 DIAGNOSIS — Z515 Encounter for palliative care: Secondary | ICD-10-CM | POA: Diagnosis not present

## 2017-11-03 DIAGNOSIS — I10 Essential (primary) hypertension: Secondary | ICD-10-CM | POA: Diagnosis present

## 2017-11-03 DIAGNOSIS — Z7189 Other specified counseling: Secondary | ICD-10-CM

## 2017-11-03 DIAGNOSIS — D649 Anemia, unspecified: Secondary | ICD-10-CM

## 2017-11-03 DIAGNOSIS — K6389 Other specified diseases of intestine: Secondary | ICD-10-CM

## 2017-11-03 DIAGNOSIS — M25569 Pain in unspecified knee: Secondary | ICD-10-CM

## 2017-11-03 DIAGNOSIS — E669 Obesity, unspecified: Secondary | ICD-10-CM | POA: Diagnosis present

## 2017-11-03 DIAGNOSIS — Z66 Do not resuscitate: Secondary | ICD-10-CM | POA: Diagnosis present

## 2017-11-03 DIAGNOSIS — Z87891 Personal history of nicotine dependence: Secondary | ICD-10-CM

## 2017-11-03 DIAGNOSIS — R42 Dizziness and giddiness: Secondary | ICD-10-CM | POA: Diagnosis not present

## 2017-11-03 DIAGNOSIS — D125 Benign neoplasm of sigmoid colon: Secondary | ICD-10-CM | POA: Diagnosis present

## 2017-11-03 DIAGNOSIS — C9002 Multiple myeloma in relapse: Secondary | ICD-10-CM

## 2017-11-03 DIAGNOSIS — Z90710 Acquired absence of both cervix and uterus: Secondary | ICD-10-CM

## 2017-11-03 DIAGNOSIS — E785 Hyperlipidemia, unspecified: Secondary | ICD-10-CM | POA: Diagnosis present

## 2017-11-03 DIAGNOSIS — K921 Melena: Secondary | ICD-10-CM | POA: Diagnosis present

## 2017-11-03 DIAGNOSIS — Z8673 Personal history of transient ischemic attack (TIA), and cerebral infarction without residual deficits: Secondary | ICD-10-CM

## 2017-11-03 DIAGNOSIS — K648 Other hemorrhoids: Secondary | ICD-10-CM | POA: Diagnosis present

## 2017-11-03 DIAGNOSIS — R109 Unspecified abdominal pain: Secondary | ICD-10-CM

## 2017-11-03 DIAGNOSIS — R51 Headache: Secondary | ICD-10-CM | POA: Diagnosis not present

## 2017-11-03 DIAGNOSIS — I6350 Cerebral infarction due to unspecified occlusion or stenosis of unspecified cerebral artery: Secondary | ICD-10-CM | POA: Diagnosis present

## 2017-11-03 DIAGNOSIS — E876 Hypokalemia: Secondary | ICD-10-CM | POA: Diagnosis not present

## 2017-11-03 DIAGNOSIS — M1711 Unilateral primary osteoarthritis, right knee: Secondary | ICD-10-CM | POA: Diagnosis present

## 2017-11-03 HISTORY — DX: Other specified counseling: Z71.89

## 2017-11-03 LAB — CBC
HEMATOCRIT: 19.6 % — AB (ref 36.0–46.0)
Hemoglobin: 6.4 g/dL — CL (ref 12.0–15.0)
MCH: 33.5 pg (ref 26.0–34.0)
MCHC: 32.7 g/dL (ref 30.0–36.0)
MCV: 102.6 fL — AB (ref 78.0–100.0)
PLATELETS: 181 10*3/uL (ref 150–400)
RBC: 1.91 MIL/uL — ABNORMAL LOW (ref 3.87–5.11)
RDW: 16.7 % — AB (ref 11.5–15.5)
WBC: 5.5 10*3/uL (ref 4.0–10.5)

## 2017-11-03 LAB — BASIC METABOLIC PANEL
Anion gap: 2 — ABNORMAL LOW (ref 5–15)
BUN: 21 mg/dL — AB (ref 6–20)
CO2: 26 mmol/L (ref 22–32)
CREATININE: 1.23 mg/dL — AB (ref 0.44–1.00)
Calcium: 8.8 mg/dL — ABNORMAL LOW (ref 8.9–10.3)
Chloride: 108 mmol/L (ref 101–111)
GFR calc Af Amer: 44 mL/min — ABNORMAL LOW (ref >60)
GFR calc non Af Amer: 38 mL/min — ABNORMAL LOW (ref >60)
GLUCOSE: 104 mg/dL — AB (ref 65–99)
Potassium: 4.3 mmol/L (ref 3.5–5.1)
Sodium: 136 mmol/L (ref 135–145)

## 2017-11-03 LAB — CBG MONITORING, ED: GLUCOSE-CAPILLARY: 85 mg/dL (ref 65–99)

## 2017-11-03 LAB — URINALYSIS, ROUTINE W REFLEX MICROSCOPIC
Bilirubin Urine: NEGATIVE
Glucose, UA: NEGATIVE mg/dL
KETONES UR: NEGATIVE mg/dL
Leukocytes, UA: NEGATIVE
Nitrite: NEGATIVE
PH: 5 (ref 5.0–8.0)
Protein, ur: NEGATIVE mg/dL
SPECIFIC GRAVITY, URINE: 1.013 (ref 1.005–1.030)

## 2017-11-03 LAB — POC OCCULT BLOOD, ED: Fecal Occult Bld: POSITIVE — AB

## 2017-11-03 LAB — HEPATIC FUNCTION PANEL
ALT: 15 U/L (ref 14–54)
AST: 19 U/L (ref 15–41)
Albumin: 2.8 g/dL — ABNORMAL LOW (ref 3.5–5.0)
Alkaline Phosphatase: 45 U/L (ref 38–126)
Bilirubin, Direct: <0.1 mg/dL — ABNORMAL LOW (ref 0.1–0.5)
TOTAL PROTEIN: 12 g/dL — AB (ref 6.5–8.1)
Total Bilirubin: 0.6 mg/dL (ref 0.3–1.2)

## 2017-11-03 LAB — HEMOGLOBIN AND HEMATOCRIT, BLOOD
HCT: 18.9 % — ABNORMAL LOW (ref 36.0–46.0)
Hemoglobin: 6 g/dL — CL (ref 12.0–15.0)

## 2017-11-03 MED ORDER — PANTOPRAZOLE SODIUM 40 MG IV SOLR
40.0000 mg | Freq: Two times a day (BID) | INTRAVENOUS | Status: DC
  Administered 2017-11-03: 40 mg via INTRAVENOUS
  Filled 2017-11-03: qty 40

## 2017-11-03 MED ORDER — IOPAMIDOL (ISOVUE-370) INJECTION 76%
INTRAVENOUS | Status: AC
  Filled 2017-11-03: qty 100

## 2017-11-03 MED ORDER — SODIUM CHLORIDE 0.9 % IV SOLN
Freq: Once | INTRAVENOUS | Status: AC
  Administered 2017-11-03: 17:00:00 via INTRAVENOUS

## 2017-11-03 MED ORDER — IOPAMIDOL (ISOVUE-370) INJECTION 76%
80.0000 mL | Freq: Once | INTRAVENOUS | Status: AC | PRN
  Administered 2017-11-03: 80 mL via INTRAVENOUS

## 2017-11-03 MED ORDER — ONDANSETRON HCL 4 MG/2ML IJ SOLN
4.0000 mg | Freq: Four times a day (QID) | INTRAMUSCULAR | Status: DC | PRN
  Administered 2017-11-08: 4 mg via INTRAVENOUS
  Filled 2017-11-03: qty 2

## 2017-11-03 MED ORDER — PRAVASTATIN SODIUM 20 MG PO TABS
40.0000 mg | ORAL_TABLET | Freq: Every day | ORAL | Status: DC
  Administered 2017-11-04 – 2017-11-15 (×11): 40 mg via ORAL
  Filled 2017-11-03: qty 1
  Filled 2017-11-03: qty 2
  Filled 2017-11-03 (×3): qty 1
  Filled 2017-11-03: qty 2
  Filled 2017-11-03: qty 1
  Filled 2017-11-03: qty 2
  Filled 2017-11-03 (×2): qty 1
  Filled 2017-11-03: qty 2

## 2017-11-03 MED ORDER — ONDANSETRON HCL 4 MG PO TABS
4.0000 mg | ORAL_TABLET | Freq: Four times a day (QID) | ORAL | Status: DC | PRN
  Administered 2017-11-04: 4 mg via ORAL
  Filled 2017-11-03 (×2): qty 1

## 2017-11-03 MED ORDER — METOPROLOL SUCCINATE ER 50 MG PO TB24
50.0000 mg | ORAL_TABLET | Freq: Two times a day (BID) | ORAL | Status: DC
  Administered 2017-11-04: 50 mg via ORAL
  Filled 2017-11-03: qty 1

## 2017-11-03 NOTE — H&P (Signed)
History and Physical    Mindy Taylor ZOX:[id number] DOB: [birth year] DOA: 11/03/2017  PCP: Cassandria Anger, MD   Patient coming from: Home  Chief Complaint: Fatigue  HPI: Mindy Taylor is a 84 y.o. female with medical history significant for multiple myeloma, CVA, HTN, who presented to the ED with complaints of fatigue, shortness of breath with exertion, and dizziness, of 3 weeks duration.  Also reports left lower extremity swelling over the past 3 weeks.  Patient denies chest pain, no cough, no fever or chills.  She reports 3-4 times weekly use of 2 tablets ibuprofen.  Reports stool Color is mostly unchanged, denies blood in stools, denies abdominal pain. Reports colonoscopy in the past  ED Course: T-max 81.1, pressure systolic 914N to 829F, heart rate 70s to 90s.. Hemoglobin low 6.4, down from 7.82 months ago, with baseline~ 10 and 11, albumin low 2.8, elevated total protein 12, creatinine 1.23.  CT Angio of the neck done for complaints of imbalance, and patient reporting symptoms are likely related to a fall in the past sustaining injury to the back of her head-negative for acute abnormality.  EDP-notes some blood in rectum, notes a small amount of gross blood in stool in rectum, GI was consulted in the ED-patient will be seen in a.m.[name]  Patient refused blood transfusion in the ED-she is a Jehovah's Witness, wants evaluation for urology of her anemia. Hospitalist called to admit for symptomatic anemia  Review of Systems: As per HPI otherwise 10 point review of systems negative.   Past Medical History:  Diagnosis Date  . ANXIETY 03/05/2007  . ATAXIA 12/26/2009  . BEE STING 03/05/2007  . BRONCHITIS, ACUTE 06/28/2010  . CANDIDIASIS, VAGINAL 09/08/2008  . CEREBROVASCULAR ACCIDENT, HX OF 01/04/2010  . CERUMEN IMPACTION 06/05/2008  . DIZZINESS 12/26/2009  . Epistaxis 06/05/2008  . Headache(784.0) 06/05/2008  . HYPERLIPIDEMIA 12/08/2006  . HYPERTENSION 12/08/2006  . 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 Multiple myeloma ([place]) 04/03/2015  . NECK PAIN 06/05/2008  . OBESITY 09/07/2009  . ONYCHOMYCOSIS 03/09/2009  . OSTEOARTHRITIS 12/08/2006  . PARESTHESIA 12/26/2009  . TOBACCO USE, QUIT 03/09/2009    Past Surgical History:  Procedure Laterality Date  . ABDOMINAL HYSTERECTOMY    . BREAST SURGERY       reports that she has quit smoking. She has never used smokeless tobacco. She reports that she does not drink alcohol or use drugs.  Allergies  Allergen Reactions  . Bee Venom Swelling    Swelling at site of sting  . Ceftin [Cefuroxime Axetil] Other (See Comments)    Made the patient "feel weird"  . Other     PT is a Jehovah Witness and wants no blood products.  . Spider Antivenin [Black Widow Spider Antivenin (L.Mactans)] Swelling and Other (See Comments)    Pain and swelling at site of bite    Family History  Problem Relation Age of Onset  . Hypertension Mother   . Hypertension Father   . Hypertension Other     Prior to Admission medications   Medication Sig Start Date End Date Taking? Authorizing Provider  amLODipine (NORVASC) 5 MG tablet TAKE 1 TABLET DAILY 06/30/17  Yes Plotnikov, Evie Lacks, MD  Cholecalciferol (VITAMIN D) 1000 UNITS capsule Take 1 capsule (1,000 Units total) by mouth daily. 01/03/11  Yes Plotnikov, Evie Lacks, MD  fish oil-omega-3 fatty acids 1000 MG capsule Take 1 g by mouth daily.     Yes [provider]  HYDROcodone-acetaminophen (NORCO/VICODIN) 5-325 MG  tablet Take 0.5-1 tablets by mouth every 6 (six) hours as needed for severe pain. 10/12/15  Yes Plotnikov, Evie Lacks, MD  Multiple Vitamin (MULTIVITAMIN) tablet Take 1 tablet by mouth daily.     Yes [provider]  olmesartan (BENICAR) 40 MG tablet Take 1 tablet (40 mg total) by mouth daily. 09/04/17  Yes Plotnikov, Evie Lacks, MD  pravastatin (PRAVACHOL) 40 MG tablet TAKE 1 TABLET DAILY 06/01/17  Yes Plotnikov, Evie Lacks, MD  TOPROL XL 50 MG 24 hr tablet TAKE 1 TABLET TWICE A DAY 08/31/17  Yes  Plotnikov, Evie Lacks, MD  vitamin C (ASCORBIC ACID) 500 MG tablet Take 500 mg by mouth daily.     Yes [provider]    Physical Exam: Vitals:   11/03/17 1756 11/03/17 1900 11/03/17 1906 11/03/17 2057  BP: (!) 168/80 (!) 149/69 (!) 149/69 (!) 146/72  Pulse: 95 95 81 82  Resp: '15  15 18  '$ Temp:      TempSrc:      SpO2: 97% 100% 100% 98%  Weight:      Height:        Constitutional: NAD, calm, comfortable Vitals:   11/03/17 1756 11/03/17 1900 11/03/17 1906 11/03/17 2057  BP: (!) 168/80 (!) 149/69 (!) 149/69 (!) 146/72  Pulse: 95 95 81 82  Resp: '15  15 18  '$ Temp:      TempSrc:      SpO2: 97% 100% 100% 98%  Weight:      Height:       Eyes: PERRL, lids and conjunctivae normal ENMT: Mucous membranes are moist. Posterior pharynx clear of any exudate or lesions.dentures. Neck: normal, supple, no masses, no thyromegaly Respiratory: clear to auscultation bilaterally, no wheezing, no crackles. Normal respiratory effort. No accessory muscle use.  Cardiovascular: Regular rate and rhythm, no murmurs / rubs / gallops.  +1 pitting edema bilat lower extremities lower third of legs. 2+ pedal pulses.   Abdomen: no tenderness, no masses palpated. No hepatosplenomegaly. Bowel sounds positive.  Musculoskeletal: no clubbing / cyanosis. No joint deformity upper and lower extremities. Good ROM, no contractures. Normal muscle tone.  Skin: no rashes, lesions, ulcers. No induration Neurologic: CN 2-12 grossly intact.  Strength 5/5 in all 4.  Psychiatric: Normal judgment and insight. Alert and oriented x 3. Normal mood.    Labs on Admission: I have personally reviewed following labs and imaging studies  CBC: Recent Labs  Lab 11/03/17 1344  WBC 5.5  HGB 6.4*  HCT 19.6*  MCV 102.6*  PLT 562   Basic Metabolic Panel: Recent Labs  Lab 11/03/17 1344  NA 136  K 4.3  CL 108  CO2 26  GLUCOSE 104*  BUN 21*  CREATININE 1.23*  CALCIUM 8.8*   Liver Function Tests: Recent Labs  Lab  11/03/17 1703  AST 19  ALT 15  ALKPHOS 45  BILITOT 0.6  PROT 12.0*  ALBUMIN 2.8*   CBG: Recent Labs  Lab 11/03/17 1258  GLUCAP 85   Urine analysis:    Component Value Date/Time   COLORURINE YELLOW 11/03/2017 [place] 11/03/2017 1344   LABSPEC 1.013 11/03/2017 1344   PHURINE 5.0 11/03/2017 1344   GLUCOSEU NEGATIVE 11/03/2017 1344   GLUCOSEU NEGATIVE 09/06/2012 0900   HGBUR LARGE (A) 11/03/2017 1344   BILIRUBINUR NEGATIVE 11/03/2017 1344   KETONESUR NEGATIVE 11/03/2017 1344   PROTEINUR NEGATIVE 11/03/2017 1344   UROBILINOGEN 0.2 03/14/2015 1300   NITRITE NEGATIVE 11/03/2017 1344   LEUKOCYTESUR NEGATIVE 11/03/2017  [place] on Admission: Ct Angio Head W Or Wo Contrast  Result Date: 11/03/2017 CLINICAL DATA:  Dizziness, headache, imbalance, and generalized weakness for 1 month. EXAM: CT ANGIOGRAPHY HEAD AND NECK TECHNIQUE: Multidetector CT imaging of the head and neck was performed using the standard protocol during bolus administration of intravenous contrast. Multiplanar CT image reconstructions and MIPs were obtained to evaluate the vascular anatomy. Carotid stenosis measurements (when applicable) are obtained utilizing NASCET criteria, using the distal internal carotid diameter as the denominator. CONTRAST:  10m ISOVUE-370 IOPAMIDOL (ISOVUE-370) INJECTION 76% COMPARISON:  Neck CTA 04/04/2011.  Brain MRI 01/02/2010. FINDINGS: CT HEAD FINDINGS Brain: There is no evidence of acute infarct, intracranial hemorrhage, mass, midline shift, or extra-axial fluid collection. The ventricles and sulci are normal for age. Patchy cerebral white matter hypodensities are nonspecific but compatible with chronic small vessel ischemic disease, mild-to-moderate for age and similar in distribution to the T2 hyperintensities on the prior MRI. Vascular: Calcified atherosclerosis at the skull base. No hyperdense vessel. Skull: No fracture or focal osseous lesion.  Sinuses: Visualized paranasal sinuses and mastoid air cells are clear. Orbits: Unremarkable. Review of the MIP images confirms the above findings CTA NECK FINDINGS Aortic arch: There is a standard 3 vessel aortic arch. Arch vessel origins are widely patent. Right carotid system: Patent without evidence of stenosis or dissection. Mild atherosclerotic plaque in the carotid bulb. Tortuous proximal common carotid artery and mid cervical ICA. Left carotid system: Patent without evidence of stenosis or dissection. Mild calcified plaque at the carotid bifurcation. Vertebral arteries: Patent without evidence of stenosis or dissection. Strongly dominant left vertebral artery with focal nonstenotic calcified plaque at its origin. Skeleton: Chronically advanced disc degeneration at C6-7 greater than C5-6. Moderate to advanced cervical facet arthrosis with unchanged grade 1 anterolisthesis of C4 on C5. Other neck: Similar appearance of left thyroid goiter which posteriorly displaces the proximal left common carotid and left subclavian arteries. Upper chest: Clear lung apices. Review of the MIP images confirms the above findings CTA HEAD FINDINGS Anterior circulation: The internal carotid arteries are patent from skull base to carotid termini with mild nonstenotic siphon atherosclerosis bilaterally. ACAs and MCAs are patent without evidence of proximal branch occlusion or flow limiting proximal stenosis. There may be a scratched of there may be an incidental fenestration of the distal right A1 segment, a normal variant. No aneurysm is identified. Posterior circulation: The intracranial vertebral arteries are patent to the basilar without focal stenosis. The right vertebral artery is particularly hypoplastic distal to the PICA origin. Patent PICA, AICA, and SCA origins are identified bilaterally. The basilar artery is widely patent. There are patent posterior communicating arteries bilaterally, right slightly larger than left.  PCAs are patent without evidence of significant stenosis. No aneurysm is identified. Venous sinuses: Patent. Anatomic variants: None of significance. Delayed phase: No abnormal enhancement. Review of the MIP images confirms the above findings IMPRESSION: 1. No evidence of acute intracranial abnormality. 2. Mild-to-moderate chronic small vessel ischemic disease. 3. Mild intracranial and cervical atherosclerosis without major vessel occlusion or significant stenosis. Electronically Signed   By: ALogan BoresM.D.   On: 11/03/2017 17:12   Ct Angio Neck W And/or Wo Contrast  Result Date: 11/03/2017 CLINICAL DATA:  Dizziness, headache, imbalance, and generalized weakness for 1 month. EXAM: CT ANGIOGRAPHY HEAD AND NECK TECHNIQUE: Multidetector CT imaging of the head and neck was performed using the standard protocol during bolus administration of intravenous contrast. Multiplanar CT image reconstructions and MIPs  were obtained to evaluate the vascular anatomy. Carotid stenosis measurements (when applicable) are obtained utilizing NASCET criteria, using the distal internal carotid diameter as the denominator. CONTRAST:  68m ISOVUE-370 IOPAMIDOL (ISOVUE-370) INJECTION 76% COMPARISON:  Neck CTA 04/04/2011.  Brain MRI 01/02/2010. FINDINGS: CT HEAD FINDINGS Brain: There is no evidence of acute infarct, intracranial hemorrhage, mass, midline shift, or extra-axial fluid collection. The ventricles and sulci are normal for age. Patchy cerebral white matter hypodensities are nonspecific but compatible with chronic small vessel ischemic disease, mild-to-moderate for age and similar in distribution to the T2 hyperintensities on the prior MRI. Vascular: Calcified atherosclerosis at the skull base. No hyperdense vessel. Skull: No fracture or focal osseous lesion. Sinuses: Visualized paranasal sinuses and mastoid air cells are clear. Orbits: Unremarkable. Review of the MIP images confirms the above findings CTA NECK FINDINGS Aortic  arch: There is a standard 3 vessel aortic arch. Arch vessel origins are widely patent. Right carotid system: Patent without evidence of stenosis or dissection. Mild atherosclerotic plaque in the carotid bulb. Tortuous proximal common carotid artery and mid cervical ICA. Left carotid system: Patent without evidence of stenosis or dissection. Mild calcified plaque at the carotid bifurcation. Vertebral arteries: Patent without evidence of stenosis or dissection. Strongly dominant left vertebral artery with focal nonstenotic calcified plaque at its origin. Skeleton: Chronically advanced disc degeneration at C6-7 greater than C5-6. Moderate to advanced cervical facet arthrosis with unchanged grade 1 anterolisthesis of C4 on C5. Other neck: Similar appearance of left thyroid goiter which posteriorly displaces the proximal left common carotid and left subclavian arteries. Upper chest: Clear lung apices. Review of the MIP images confirms the above findings CTA HEAD FINDINGS Anterior circulation: The internal carotid arteries are patent from skull base to carotid termini with mild nonstenotic siphon atherosclerosis bilaterally. ACAs and MCAs are patent without evidence of proximal branch occlusion or flow limiting proximal stenosis. There may be a scratched of there may be an incidental fenestration of the distal right A1 segment, a normal variant. No aneurysm is identified. Posterior circulation: The intracranial vertebral arteries are patent to the basilar without focal stenosis. The right vertebral artery is particularly hypoplastic distal to the PICA origin. Patent PICA, AICA, and SCA origins are identified bilaterally. The basilar artery is widely patent. There are patent posterior communicating arteries bilaterally, right slightly larger than left. PCAs are patent without evidence of significant stenosis. No aneurysm is identified. Venous sinuses: Patent. Anatomic variants: None of significance. Delayed phase: No  abnormal enhancement. Review of the MIP images confirms the above findings IMPRESSION: 1. No evidence of acute intracranial abnormality. 2. Mild-to-moderate chronic small vessel ischemic disease. 3. Mild intracranial and cervical atherosclerosis without major vessel occlusion or significant stenosis. Electronically Signed   By: ALogan BoresM.D.   On: 11/03/2017 17:12    EKG: Independently reviewed.  Sinus rhythm normal intervals  Assessment/Plan Active Problems:   Essential hypertension   Cerebral artery occlusion with cerebral infarction (HCC)   Multiple myeloma (HCC)   Symptomatic anemia   Symptomatic anemia-hemoglobin 6.4.  Shortness of breath with exertion, dizziness.  Gradual trend down over the past few weeks. Baseline 10-11.  Likely 2/2 GI bleed.  NSAID use history. EDP notes some blood in rectum.  Colonoscopy- 2005-benign large bowel neoplasia-tubulovillous adenomatous polyp, external hemorrhoids. -Patient refusing transfusion, I explained and she understood the risk of refusing blood and benefits of transfusion was explained to patient, patient still adamant that she does not want to be transfused.  Received transfusion in the  past once, before she became a Jehovah's Witness.  Refusing transfusion even if hemoglobin continues to trend down, becomes life-threatening.  -Protonix bolus and gtt -Ferritin, iron this a.m. -F/u GI recommendations a.m. - NPO - N/s 75cc/hr X 12 hrs  Multiple myeloma-patient previously followed with Dr. Sandra Cockayne, patient declined treatment since 2016 and is not ready to continue medications.  She reports medications were too strong for her, she felt worse with treatment. -Follow-up outpatient  HTN-pressure systolic 063K to 160F.  -Hold home olmesartan and Norvasc, initially started home metoprolol 50 BID but will hold all anti-HTN in the setting of GI bleed.  DVT prophylaxis: SCds Code Status: DNR-confirmed patient at bedside Family Communication:    Disposition Plan: per rounding team Consults called: Gi Admission status: obs, tele   Bethena Roys MD [facility] Pager [phone] From 6PM-2AM.  Otherwise please contact night-coverage [email or web address] Password [facility]  11/03/2017, 9:28 PM

## 2017-11-03 NOTE — ED Triage Notes (Signed)
x1 month, pt has had dizziness and has headache. Pt states that her balance has been off, and she has been feeling overall weak. Pt lives by herself, and is concerned that "something is wrong".

## 2017-11-03 NOTE — ED Provider Notes (Signed)
[facility] DEPT Provider Note   CSN: [id number] Arrival date & time: 11/03/17  1222     History   Chief Complaint Chief Complaint  Patient presents with  . Dizziness    HPI Mindy Taylor is a 84 y.o. female.  84yo female with history of multiple myeloma presents with her niece today with complaint of not feeling well, progressively worsening over the past month. States she feels light headed upon standing, SHOB with exertion. Denies chest pain today, takes an iron pill for low iron anemia and states her stools are normally green- unsure what her stools look like recently. Also complains of a posterior headache for the past week that is very bothersome for her, states she had a head injury about 7 years ago and was told she had bleeding of the blood vessels in the back of her head but is unable to provide any further history. Also complains of left lower leg swelling x 3 weeks, states that she had a wound on her leg that has been slow to heal and suspects the swelling in her leg is due to loose ligaments in her ankle but is unsure why she would have loose ligaments. No other complaints or concerns.      Past Medical History:  Diagnosis Date  . ANXIETY 03/05/2007  . ATAXIA 12/26/2009  . BEE STING 03/05/2007  . BRONCHITIS, ACUTE 06/28/2010  . CANDIDIASIS, VAGINAL 09/08/2008  . CEREBROVASCULAR ACCIDENT, HX OF 01/04/2010  . CERUMEN IMPACTION 06/05/2008  . DIZZINESS 12/26/2009  . Epistaxis 06/05/2008  . Headache(784.0) 06/05/2008  . HYPERLIPIDEMIA 12/08/2006  . HYPERTENSION 12/08/2006  . 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 Multiple myeloma ([place]) 04/03/2015  . NECK PAIN 06/05/2008  . OBESITY 09/07/2009  . ONYCHOMYCOSIS 03/09/2009  . OSTEOARTHRITIS 12/08/2006  . PARESTHESIA 12/26/2009  . TOBACCO USE, QUIT 03/09/2009    Patient Active Problem List   Diagnosis Date Noted  . Insomnia 06/01/2017  . Contusion of left leg 04/20/2017  . Sore in nose 06/03/2016  . Cough  05/22/2016  . HLD (hyperlipidemia)   . Cellulitis 12/24/2015  . Cellulitis of left upper extremity 12/23/2015  . Well adult exam 11/19/2015  . Loss of weight 10/12/2015  . Normocytic anemia 05/07/2015  . Multiple myeloma ([place]) 04/03/2015  . Arthralgia 02/01/2015  . Right wrist pain 12/01/2014  . Arm pain, left 11/22/2014  . No blood products 03/11/2014  . Right knee pain 07/26/2013  . Chest pain 04/26/2013  . Left ankle swelling 03/04/2013  . Clostridium difficile diarrhea 10/22/2012  . Right hand pain 09/08/2012  . Elbow pain, right 03/15/2012  . Knee pain, left 10/02/2011  . Hypercalcemia 10/02/2011  . Hyperglycemia 08/08/2011  . Neck pain, chronic 05/05/2011  . Knee pain, bilateral 02/18/2011  . Fatigue 01/03/2011  . Bronchitis, acute, with bronchospasm 12/11/2010  . Wheezing 11/28/2010  . Preventative health care 11/28/2010  . Cerebral artery occlusion with cerebral infarction ([place]) 01/04/2010  . CVA (cerebral vascular accident) ([place]) 01/04/2010  . DIZZINESS 12/26/2009  . ATAXIA 12/26/2009  . PARESTHESIA 12/26/2009  . Morbid obesity due to excess calories ([address]) 09/07/2009  . ONYCHOMYCOSIS 03/09/2009  . TOBACCO USE, QUIT 03/09/2009  . CANDIDIASIS, VAGINAL 09/08/2008  . CERUMEN IMPACTION 06/05/2008  . Headache(784.0) 06/05/2008  . Epistaxis 06/05/2008  . Anxiety state 03/05/2007  . BEE STING 03/05/2007  . Dyslipidemia 12/08/2006  . Essential hypertension 12/08/2006  . Osteoarthritis 12/08/2006    Past Surgical History:  Procedure Laterality Date  . ABDOMINAL HYSTERECTOMY    .  BREAST SURGERY       OB History   None      Home Medications    Prior to Admission medications   Medication Sig Start Date End Date Taking? Authorizing Provider  amLODipine (NORVASC) 5 MG tablet TAKE 1 TABLET DAILY 06/30/17  Yes Plotnikov, Evie Lacks, MD  Cholecalciferol (VITAMIN D) 1000 UNITS capsule Take 1 capsule (1,000 Units total) by mouth daily. 01/03/11  Yes Plotnikov, Evie Lacks, MD  fish oil-omega-3 fatty acids 1000 MG capsule Take 1 g by mouth daily.     Yes [provider]  HYDROcodone-acetaminophen (NORCO/VICODIN) 5-325 MG tablet Take 0.5-1 tablets by mouth every 6 (six) hours as needed for severe pain. 10/12/15  Yes Plotnikov, Evie Lacks, MD  Multiple Vitamin (MULTIVITAMIN) tablet Take 1 tablet by mouth daily.     Yes [provider]  olmesartan (BENICAR) 40 MG tablet Take 1 tablet (40 mg total) by mouth daily. 09/04/17  Yes Plotnikov, Evie Lacks, MD  pravastatin (PRAVACHOL) 40 MG tablet TAKE 1 TABLET DAILY 06/01/17  Yes Plotnikov, Evie Lacks, MD  TOPROL XL 50 MG 24 hr tablet TAKE 1 TABLET TWICE A DAY 08/31/17  Yes Plotnikov, Evie Lacks, MD  vitamin C (ASCORBIC ACID) 500 MG tablet Take 500 mg by mouth daily.     Yes [provider]    Family History Family History  Problem Relation Age of Onset  . Hypertension Mother   . Hypertension Father   . Hypertension Other     Social History Social History   Tobacco Use  . Smoking status: Former Research scientist (life sciences)  . Smokeless tobacco: Never Used  . Tobacco comment: Not sure when; many, many years   Substance Use Topics  . Alcohol use: No    Alcohol/week: 0.0 oz  . Drug use: No     Allergies   Bee venom; Ceftin [cefuroxime axetil]; Other; and Spider antivenin [black widow spider antivenin (l.mactans)]   Review of Systems Review of Systems  Constitutional: Positive for appetite change and fatigue. Negative for chills and fever.  Eyes: Negative for visual disturbance.  Respiratory: Positive for shortness of breath. Negative for chest tightness.   Cardiovascular: Positive for leg swelling. Negative for chest pain and palpitations.  Gastrointestinal: Negative for abdominal distention, abdominal pain, constipation, diarrhea, nausea and vomiting.  Genitourinary: Negative for difficulty urinating, dysuria and frequency.  Musculoskeletal: Negative for arthralgias and myalgias.  Skin: Negative for rash  and wound.  Neurological: Positive for weakness, light-headedness and headaches. Negative for speech difficulty.  Hematological: Does not bruise/bleed easily.  Psychiatric/Behavioral: Positive for sleep disturbance. Negative for confusion and suicidal ideas.  All other systems reviewed and are negative.    Physical Exam Updated Vital Signs BP (!) 149/69   Pulse 81   Temp 98.7 F (37.1 C) (Oral)   Resp 15   Ht '5\' 7"'$  (1.702 m)   Wt 88.5 kg (195 lb)   SpO2 100%   BMI 30.54 kg/m   Physical Exam  Constitutional: She is oriented to person, place, and time. She appears well-developed and well-nourished.  HENT:  Head: Normocephalic and atraumatic.  Eyes: Conjunctivae are normal.  Neck: Neck supple.  Cardiovascular: Normal rate, regular rhythm, normal heart sounds and intact distal pulses.  No murmur heard. Pulmonary/Chest: Effort normal and breath sounds normal. No respiratory distress.  Abdominal: Soft. She exhibits no distension. There is no tenderness.  Genitourinary:  Genitourinary Comments: RN assisted with rectal exam, grossly positive stool. Small area decubitus ulcer left  Neurological: She is alert and oriented to person, place, and time.  Skin: Skin is warm and dry. Capillary refill takes less than 2 seconds. No rash noted.  Psychiatric: She has a normal mood and affect.  Nursing note and vitals reviewed.    ED Treatments / Results  Labs (all labs ordered are listed, but only abnormal results are displayed) Labs Reviewed  BASIC METABOLIC PANEL - Abnormal; Notable for the following components:      Result Value   Glucose, Bld 104 (*)    BUN 21 (*)    Creatinine, Ser 1.23 (*)    Calcium 8.8 (*)    GFR calc non Af Amer 38 (*)    GFR calc Af Amer 44 (*)    Anion gap 2 (*)    All other components within normal limits  CBC - Abnormal; Notable for the following components:   RBC 1.91 (*)    Hemoglobin 6.4 (*)    HCT 19.6 (*)    MCV 102.6 (*)    RDW 16.7 (*)     All other components within normal limits  URINALYSIS, ROUTINE W REFLEX MICROSCOPIC - Abnormal; Notable for the following components:   Hgb urine dipstick LARGE (*)    Bacteria, UA MANY (*)    All other components within normal limits  HEPATIC FUNCTION PANEL - Abnormal; Notable for the following components:   Total Protein 12.0 (*)    Albumin 2.8 (*)    Bilirubin, Direct <0.1 (*)    All other components within normal limits  POC OCCULT BLOOD, ED - Abnormal; Notable for the following components:   Fecal Occult Bld POSITIVE (*)    All other components within normal limits  CBG MONITORING, ED    EKG None  Radiology Ct Angio Head W Or Wo Contrast  Result Date: 11/03/2017 CLINICAL DATA:  Dizziness, headache, imbalance, and generalized weakness for 1 month. EXAM: CT ANGIOGRAPHY HEAD AND NECK TECHNIQUE: Multidetector CT imaging of the head and neck was performed using the standard protocol during bolus administration of intravenous contrast. Multiplanar CT image reconstructions and MIPs were obtained to evaluate the vascular anatomy. Carotid stenosis measurements (when applicable) are obtained utilizing NASCET criteria, using the distal internal carotid diameter as the denominator. CONTRAST:  32m ISOVUE-370 IOPAMIDOL (ISOVUE-370) INJECTION 76% COMPARISON:  Neck CTA 04/04/2011.  Brain MRI 01/02/2010. FINDINGS: CT HEAD FINDINGS Brain: There is no evidence of acute infarct, intracranial hemorrhage, mass, midline shift, or extra-axial fluid collection. The ventricles and sulci are normal for age. Patchy cerebral white matter hypodensities are nonspecific but compatible with chronic small vessel ischemic disease, mild-to-moderate for age and similar in distribution to the T2 hyperintensities on the prior MRI. Vascular: Calcified atherosclerosis at the skull base. No hyperdense vessel. Skull: No fracture or focal osseous lesion. Sinuses: Visualized paranasal sinuses and mastoid air cells are clear. Orbits:  Unremarkable. Review of the MIP images confirms the above findings CTA NECK FINDINGS Aortic arch: There is a standard 3 vessel aortic arch. Arch vessel origins are widely patent. Right carotid system: Patent without evidence of stenosis or dissection. Mild atherosclerotic plaque in the carotid bulb. Tortuous proximal common carotid artery and mid cervical ICA. Left carotid system: Patent without evidence of stenosis or dissection. Mild calcified plaque at the carotid bifurcation. Vertebral arteries: Patent without evidence of stenosis or dissection. Strongly dominant left vertebral artery with focal nonstenotic calcified plaque at its origin. Skeleton: Chronically advanced disc degeneration at C6-7 greater than C5-6. Moderate to advanced cervical  facet arthrosis with unchanged grade 1 anterolisthesis of C4 on C5. Other neck: Similar appearance of left thyroid goiter which posteriorly displaces the proximal left common carotid and left subclavian arteries. Upper chest: Clear lung apices. Review of the MIP images confirms the above findings CTA HEAD FINDINGS Anterior circulation: The internal carotid arteries are patent from skull base to carotid termini with mild nonstenotic siphon atherosclerosis bilaterally. ACAs and MCAs are patent without evidence of proximal branch occlusion or flow limiting proximal stenosis. There may be a scratched of there may be an incidental fenestration of the distal right A1 segment, a normal variant. No aneurysm is identified. Posterior circulation: The intracranial vertebral arteries are patent to the basilar without focal stenosis. The right vertebral artery is particularly hypoplastic distal to the PICA origin. Patent PICA, AICA, and SCA origins are identified bilaterally. The basilar artery is widely patent. There are patent posterior communicating arteries bilaterally, right slightly larger than left. PCAs are patent without evidence of significant stenosis. No aneurysm is  identified. Venous sinuses: Patent. Anatomic variants: None of significance. Delayed phase: No abnormal enhancement. Review of the MIP images confirms the above findings IMPRESSION: 1. No evidence of acute intracranial abnormality. 2. Mild-to-moderate chronic small vessel ischemic disease. 3. Mild intracranial and cervical atherosclerosis without major vessel occlusion or significant stenosis. Electronically Signed   By: Logan Bores M.D.   On: 11/03/2017 17:12   Ct Angio Neck W And/or Wo Contrast  Result Date: 11/03/2017 CLINICAL DATA:  Dizziness, headache, imbalance, and generalized weakness for 1 month. EXAM: CT ANGIOGRAPHY HEAD AND NECK TECHNIQUE: Multidetector CT imaging of the head and neck was performed using the standard protocol during bolus administration of intravenous contrast. Multiplanar CT image reconstructions and MIPs were obtained to evaluate the vascular anatomy. Carotid stenosis measurements (when applicable) are obtained utilizing NASCET criteria, using the distal internal carotid diameter as the denominator. CONTRAST:  52m ISOVUE-370 IOPAMIDOL (ISOVUE-370) INJECTION 76% COMPARISON:  Neck CTA 04/04/2011.  Brain MRI 01/02/2010. FINDINGS: CT HEAD FINDINGS Brain: There is no evidence of acute infarct, intracranial hemorrhage, mass, midline shift, or extra-axial fluid collection. The ventricles and sulci are normal for age. Patchy cerebral white matter hypodensities are nonspecific but compatible with chronic small vessel ischemic disease, mild-to-moderate for age and similar in distribution to the T2 hyperintensities on the prior MRI. Vascular: Calcified atherosclerosis at the skull base. No hyperdense vessel. Skull: No fracture or focal osseous lesion. Sinuses: Visualized paranasal sinuses and mastoid air cells are clear. Orbits: Unremarkable. Review of the MIP images confirms the above findings CTA NECK FINDINGS Aortic arch: There is a standard 3 vessel aortic arch. Arch vessel origins are  widely patent. Right carotid system: Patent without evidence of stenosis or dissection. Mild atherosclerotic plaque in the carotid bulb. Tortuous proximal common carotid artery and mid cervical ICA. Left carotid system: Patent without evidence of stenosis or dissection. Mild calcified plaque at the carotid bifurcation. Vertebral arteries: Patent without evidence of stenosis or dissection. Strongly dominant left vertebral artery with focal nonstenotic calcified plaque at its origin. Skeleton: Chronically advanced disc degeneration at C6-7 greater than C5-6. Moderate to advanced cervical facet arthrosis with unchanged grade 1 anterolisthesis of C4 on C5. Other neck: Similar appearance of left thyroid goiter which posteriorly displaces the proximal left common carotid and left subclavian arteries. Upper chest: Clear lung apices. Review of the MIP images confirms the above findings CTA HEAD FINDINGS Anterior circulation: The internal carotid arteries are patent from skull base to carotid termini with mild  nonstenotic siphon atherosclerosis bilaterally. ACAs and MCAs are patent without evidence of proximal branch occlusion or flow limiting proximal stenosis. There may be a scratched of there may be an incidental fenestration of the distal right A1 segment, a normal variant. No aneurysm is identified. Posterior circulation: The intracranial vertebral arteries are patent to the basilar without focal stenosis. The right vertebral artery is particularly hypoplastic distal to the PICA origin. Patent PICA, AICA, and SCA origins are identified bilaterally. The basilar artery is widely patent. There are patent posterior communicating arteries bilaterally, right slightly larger than left. PCAs are patent without evidence of significant stenosis. No aneurysm is identified. Venous sinuses: Patent. Anatomic variants: None of significance. Delayed phase: No abnormal enhancement. Review of the MIP images confirms the above findings  IMPRESSION: 1. No evidence of acute intracranial abnormality. 2. Mild-to-moderate chronic small vessel ischemic disease. 3. Mild intracranial and cervical atherosclerosis without major vessel occlusion or significant stenosis. Electronically Signed   By: Logan Bores M.D.   On: 11/03/2017 17:12    Procedures Procedures (including critical care time)  Medications Ordered in ED Medications  iopamidol (ISOVUE-370) 76 % injection (has no administration in time range)  0.9 %  sodium chloride infusion ( Intravenous New Bag/Given 11/03/17 1658)  iopamidol (ISOVUE-370) 76 % injection 80 mL (80 mLs Intravenous Contrast Given 11/03/17 1632)     Initial Impression / Assessment and Plan / ED Course  I have reviewed the triage vital signs and the nursing notes.  Pertinent labs & imaging results that were available during my care of the patient were reviewed by me and considered in my medical decision making (see chart for details).  Clinical Course as of Nov 04 1946  Tue Nov 03, 2017  1535 84yo female with history of multiple myeloma, not currently undergoing treatment, with weakness, fatigue, SHOB with exertion and feeling light headed upon standing progressively worsening over the past month. Here today due to posterior headache for the past week. On exam, LLE pitting edema, bright red stool on rectal exam. Review of previous labs, CBC today with Hgb 6.4, previously 7.8 on 09/04/17 and prior to that Hgb 10-11. Ordered transfusion 1 unit PRBC, venous doppler to evaluate LLE edema, CT for hx of headache.    [LM]  1547 Patient declines blood transfusion, states she is a Jehov[employer] Witness and does not want any blood products at this time. Discussed Hgb trend with patient, she chooses not to receive blood at this time.    [LM]  1620 EKG completed at 1254 hrs, Sinus rhythm rate 84, no acute ischemic changes.    [LM]  1737 Venous doppler LLE negative for acute DVT. CTA head/neck without acute findings. Discussed  results with patient who agrees to admission for further evaluation of her anemia without transfusion.    [LM]  1806 Discussed case with Dr. Henrene Pastor, [place], agrees to consult, GI will see patient in the morning.    [LM]  1943 Discussed with hospitalist, who will consult for admission.   [LM]    Clinical Course User Index [LM] Tacy Learn, PA-C      Final Clinical Impressions(s) / ED Diagnoses   Final diagnoses:  Weakness  Anemia, unspecified type  Hematuria, unspecified type  Gastrointestinal hemorrhage, unspecified gastrointestinal hemorrhage type    ED Discharge Orders    None       Roque Lias 11/03/17 1949    Mesner, Corene Cornea, MD 11/04/17 1641

## 2017-11-03 NOTE — ED Provider Notes (Signed)
Medical screening examination/treatment/procedure(s) were conducted as a shared visit with non-physician practitioner(s) and myself.  I personally evaluated the patient during the encounter.  84 year old female here with multiple complaints.  Seems that she is mostly here with headache associated with some dizziness, weakness and off balance.  She also has worsening fatigue.  She was found to have a hemoglobin of 6.7 and Hemoccult positive stool which is likely cause of all of her symptoms however patient is adamant that it has to do with the previous head injury.  CT scan her head to ensure no obvious abnormalities however patient is refusing blood transfusion and admission.  She is a Sales promotion account executive Witness and states that this is against her religion and she refuses it.  She is with her niece and her husband who both agreed to her competency to make this decision and understand the risk of dying if this is not fixed.  She does agree to see the gastroenterology as an outpatient if CT scan is negative.  None     Molina Hollenback, Corene Cornea, MD 11/04/17 [phone]

## 2017-11-03 NOTE — ED Notes (Signed)
ED TO INPATIENT HANDOFF REPORT  Name/Age/Gender Mindy Taylor 84 y.o. female  Code Status Code Status History    Date Active Date Inactive Code Status Order ID Comments User Context   12/24/2015 0256 12/25/2015 1716 Full Code [id number]  Rise Patience, MD Inpatient   03/11/2014 0117 03/13/2014 1538 Full Code [id number]  Toy Baker, MD Inpatient    Advance Directive Documentation     Most Recent Value  Type of Advance Directive  Healthcare Power of [place], Living will  Pre-existing out of facility DNR order (yellow form or pink MOST form)  -  "MOST" Form in Place?  -      Home/SNF/Other Home  Chief Complaint weak, balance off  Level of Care/Admitting Diagnosis ED Disposition    ED Disposition Condition [place]: [place] [100102]  Level of Care: Telemetry [5]  Admit to tele based on following criteria: Other see comments  Comments: Symptomatic anemia  Diagnosis: Symptomatic anemia [7824235]  Admitting Physician: Bethena Roys [phone]  Attending Physician: Bethena Roys Nessa.Cuff  PT Class (Do Not Modify): Observation [104]  PT Acc Code (Do Not Modify): Observation [10022]       Medical History Past Medical History:  Diagnosis Date  . ANXIETY 03/05/2007  . ATAXIA 12/26/2009  . BEE STING 03/05/2007  . BRONCHITIS, ACUTE 06/28/2010  . CANDIDIASIS, VAGINAL 09/08/2008  . CEREBROVASCULAR ACCIDENT, HX OF 01/04/2010  . CERUMEN IMPACTION 06/05/2008  . DIZZINESS 12/26/2009  . Epistaxis 06/05/2008  . Headache(784.0) 06/05/2008  . HYPERLIPIDEMIA 12/08/2006  . HYPERTENSION 12/08/2006  . 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 Multiple myeloma ([place]) 04/03/2015  . NECK PAIN 06/05/2008  . OBESITY 09/07/2009  . ONYCHOMYCOSIS 03/09/2009  . OSTEOARTHRITIS 12/08/2006  . PARESTHESIA 12/26/2009  . TOBACCO USE, QUIT 03/09/2009    Allergies Allergies  Allergen Reactions  . Bee Venom Swelling    Swelling at site of sting  .  Ceftin [Cefuroxime Axetil] Other (See Comments)    Made the patient "feel weird"  . Other     PT is a Jehovah Witness and wants no blood products.  . Spider Antivenin [Black Widow Spider Antivenin (L.Mactans)] Swelling and Other (See Comments)    Pain and swelling at site of bite    IV Location/Drains/Wounds Patient Lines/Drains/Airways Status   Active Line/Drains/Airways    Name:   Placement date:   Placement time:   Site:   Days:   Peripheral IV 11/03/17 Left Antecubital   11/03/17    1631    Antecubital   less than 1          Labs/Imaging Results for orders placed or performed during the hospital encounter of 11/03/17 (from the past 48 hour(s))  CBG monitoring, ED     Status: None   Collection Time: 11/03/17 12:58 PM  Result Value Ref Range   Glucose-Capillary 85 65 - 99 mg/dL  Basic metabolic panel     Status: Abnormal   Collection Time: 11/03/17  1:44 PM  Result Value Ref Range   Sodium 136 135 - 145 mmol/L    Comment: REPEATED TO VERIFY   Potassium 4.3 3.5 - 5.1 mmol/L   Chloride 108 101 - 111 mmol/L    Comment: REPEATED TO VERIFY   CO2 26 22 - 32 mmol/L    Comment: REPEATED TO VERIFY   Glucose, Bld 104 (H) 65 - 99 mg/dL   BUN 21 (H) 6 - 20 mg/dL   Creatinine, Ser 1.23 (  H) 0.44 - 1.00 mg/dL   Calcium 8.8 (L) 8.9 - 10.3 mg/dL   GFR calc non Af Amer 38 (L) >60 mL/min   GFR calc Af Amer 44 (L) >60 mL/min    Comment: (NOTE) The eGFR has been calculated using the CKD EPI equation. This calculation has not been validated in all clinical situations. eGFR's persistently <60 mL/min signify possible Chronic Kidney Disease.    Anion gap 2 (L) 5 - 15    Comment: REPEATED TO VERIFY Performed at [facility] [address], [place], [place] [address]   CBC     Status: Abnormal   Collection Time: 11/03/17  1:44 PM  Result Value Ref Range   WBC 5.5 4.0 - 10.5 K/uL   RBC 1.91 (L) 3.87 - 5.11 MIL/uL   Hemoglobin 6.4 (LL) 12.0 - 15.0 g/dL    Comment:  REPEATED TO VERIFY CRITICAL RESULT CALLED TO, READ BACK BY AND VERIFIED WITH: KHALID,A. RN '@1414'$  ON 06.11.19 BY COHEN,K    HCT 19.6 (L) 36.0 - 46.0 %   MCV 102.6 (H) 78.0 - 100.0 fL   MCH 33.5 26.0 - 34.0 pg   MCHC 32.7 30.0 - 36.0 g/dL   RDW 16.7 (H) 11.5 - 15.5 %   Platelets 181 150 - 400 K/uL    Comment: Performed at [facility], [place] [address], [place], [place] [address]  Urinalysis, Routine w reflex microscopic     Status: Abnormal   Collection Time: 11/03/17  1:44 PM  Result Value Ref Range   Color, Urine YELLOW YELLOW   APPearance CLEAR CLEAR   Specific Gravity, Urine 1.013 1.005 - 1.030   pH 5.0 5.0 - 8.0   Glucose, UA NEGATIVE NEGATIVE mg/dL   Hgb urine dipstick LARGE (A) NEGATIVE   Bilirubin Urine NEGATIVE NEGATIVE   Ketones, ur NEGATIVE NEGATIVE mg/dL   Protein, ur NEGATIVE NEGATIVE mg/dL   Nitrite NEGATIVE NEGATIVE   Leukocytes, UA NEGATIVE NEGATIVE   RBC / HPF 6-10 0 - 5 RBC/hpf   WBC, UA 0-5 0 - 5 WBC/hpf   Bacteria, UA MANY (A) NONE SEEN   Squamous Epithelial / LPF 0-5 0 - 5   Hyaline Casts, UA PRESENT     Comment: Performed at [facility], [place] [address], [place], [place] [address]  POC occult blood, ED RN will collect     Status: Abnormal   Collection Time: 11/03/17  3:20 PM  Result Value Ref Range   Fecal Occult Bld POSITIVE (A) NEGATIVE  Hepatic function panel     Status: Abnormal   Collection Time: 11/03/17  5:03 PM  Result Value Ref Range   Total Protein 12.0 (H) 6.5 - 8.1 g/dL   Albumin 2.8 (L) 3.5 - 5.0 g/dL   AST 19 15 - 41 U/L   ALT 15 14 - 54 U/L   Alkaline Phosphatase 45 38 - 126 U/L   Total Bilirubin 0.6 0.3 - 1.2 mg/dL   Bilirubin, Direct <0.1 (L) 0.1 - 0.5 mg/dL   Indirect Bilirubin NOT CALCULATED 0.3 - 0.9 mg/dL    Comment: Performed at [facility], [place] [address] [place], [place] [address]  Hemoglobin and hematocrit, blood     Status: Abnormal   Collection Time: 11/03/17 10:24  PM  Result Value Ref Range   Hemoglobin 6.0 (LL) 12.0 - 15.0 g/dL    Comment: CRITICAL VALUE NOTED.  VALUE IS CONSISTENT WITH PREVIOUSLY REPORTED AND CALLED VALUE. REPEATED TO  VERIFY    HCT 18.9 (L) 36.0 - 46.0 %    Comment: Performed at [facility], [place] [address] [place], [place] [address]   Ct Angio Head W Or Wo Contrast  Result Date: 11/03/2017 CLINICAL DATA:  Dizziness, headache, imbalance, and generalized weakness for 1 month. EXAM: CT ANGIOGRAPHY HEAD AND NECK TECHNIQUE: Multidetector CT imaging of the head and neck was performed using the standard protocol during bolus administration of intravenous contrast. Multiplanar CT image reconstructions and MIPs were obtained to evaluate the vascular anatomy. Carotid stenosis measurements (when applicable) are obtained utilizing NASCET criteria, using the distal internal carotid diameter as the denominator. CONTRAST:  74m ISOVUE-370 IOPAMIDOL (ISOVUE-370) INJECTION 76% COMPARISON:  Neck CTA 04/04/2011.  Brain MRI 01/02/2010. FINDINGS: CT HEAD FINDINGS Brain: There is no evidence of acute infarct, intracranial hemorrhage, mass, midline shift, or extra-axial fluid collection. The ventricles and sulci are normal for age. Patchy cerebral white matter hypodensities are nonspecific but compatible with chronic small vessel ischemic disease, mild-to-moderate for age and similar in distribution to the T2 hyperintensities on the prior MRI. Vascular: Calcified atherosclerosis at the skull base. No hyperdense vessel. Skull: No fracture or focal osseous lesion. Sinuses: Visualized paranasal sinuses and mastoid air cells are clear. Orbits: Unremarkable. Review of the MIP images confirms the above findings CTA NECK FINDINGS Aortic arch: There is a standard 3 vessel aortic arch. Arch vessel origins are widely patent. Right carotid system: Patent without evidence of stenosis or dissection. Mild atherosclerotic plaque in the carotid bulb. Tortuous  proximal common carotid artery and mid cervical ICA. Left carotid system: Patent without evidence of stenosis or dissection. Mild calcified plaque at the carotid bifurcation. Vertebral arteries: Patent without evidence of stenosis or dissection. Strongly dominant left vertebral artery with focal nonstenotic calcified plaque at its origin. Skeleton: Chronically advanced disc degeneration at C6-7 greater than C5-6. Moderate to advanced cervical facet arthrosis with unchanged grade 1 anterolisthesis of C4 on C5. Other neck: Similar appearance of left thyroid goiter which posteriorly displaces the proximal left common carotid and left subclavian arteries. Upper chest: Clear lung apices. Review of the MIP images confirms the above findings CTA HEAD FINDINGS Anterior circulation: The internal carotid arteries are patent from skull base to carotid termini with mild nonstenotic siphon atherosclerosis bilaterally. ACAs and MCAs are patent without evidence of proximal branch occlusion or flow limiting proximal stenosis. There may be a scratched of there may be an incidental fenestration of the distal right A1 segment, a normal variant. No aneurysm is identified. Posterior circulation: The intracranial vertebral arteries are patent to the basilar without focal stenosis. The right vertebral artery is particularly hypoplastic distal to the PICA origin. Patent PICA, AICA, and SCA origins are identified bilaterally. The basilar artery is widely patent. There are patent posterior communicating arteries bilaterally, right slightly larger than left. PCAs are patent without evidence of significant stenosis. No aneurysm is identified. Venous sinuses: Patent. Anatomic variants: None of significance. Delayed phase: No abnormal enhancement. Review of the MIP images confirms the above findings IMPRESSION: 1. No evidence of acute intracranial abnormality. 2. Mild-to-moderate chronic small vessel ischemic disease. 3. Mild intracranial and  cervical atherosclerosis without major vessel occlusion or significant stenosis. Electronically Signed   By: ALogan BoresM.D.   On: 11/03/2017 17:12   Ct Angio Neck W And/or Wo Contrast  Result Date: 11/03/2017 CLINICAL DATA:  Dizziness, headache, imbalance, and generalized weakness for 1 month. EXAM: CT ANGIOGRAPHY HEAD AND NECK TECHNIQUE: Multidetector CT imaging of  the head and neck was performed using the standard protocol during bolus administration of intravenous contrast. Multiplanar CT image reconstructions and MIPs were obtained to evaluate the vascular anatomy. Carotid stenosis measurements (when applicable) are obtained utilizing NASCET criteria, using the distal internal carotid diameter as the denominator. CONTRAST:  60m ISOVUE-370 IOPAMIDOL (ISOVUE-370) INJECTION 76% COMPARISON:  Neck CTA 04/04/2011.  Brain MRI 01/02/2010. FINDINGS: CT HEAD FINDINGS Brain: There is no evidence of acute infarct, intracranial hemorrhage, mass, midline shift, or extra-axial fluid collection. The ventricles and sulci are normal for age. Patchy cerebral white matter hypodensities are nonspecific but compatible with chronic small vessel ischemic disease, mild-to-moderate for age and similar in distribution to the T2 hyperintensities on the prior MRI. Vascular: Calcified atherosclerosis at the skull base. No hyperdense vessel. Skull: No fracture or focal osseous lesion. Sinuses: Visualized paranasal sinuses and mastoid air cells are clear. Orbits: Unremarkable. Review of the MIP images confirms the above findings CTA NECK FINDINGS Aortic arch: There is a standard 3 vessel aortic arch. Arch vessel origins are widely patent. Right carotid system: Patent without evidence of stenosis or dissection. Mild atherosclerotic plaque in the carotid bulb. Tortuous proximal common carotid artery and mid cervical ICA. Left carotid system: Patent without evidence of stenosis or dissection. Mild calcified plaque at the carotid  bifurcation. Vertebral arteries: Patent without evidence of stenosis or dissection. Strongly dominant left vertebral artery with focal nonstenotic calcified plaque at its origin. Skeleton: Chronically advanced disc degeneration at C6-7 greater than C5-6. Moderate to advanced cervical facet arthrosis with unchanged grade 1 anterolisthesis of C4 on C5. Other neck: Similar appearance of left thyroid goiter which posteriorly displaces the proximal left common carotid and left subclavian arteries. Upper chest: Clear lung apices. Review of the MIP images confirms the above findings CTA HEAD FINDINGS Anterior circulation: The internal carotid arteries are patent from skull base to carotid termini with mild nonstenotic siphon atherosclerosis bilaterally. ACAs and MCAs are patent without evidence of proximal branch occlusion or flow limiting proximal stenosis. There may be a scratched of there may be an incidental fenestration of the distal right A1 segment, a normal variant. No aneurysm is identified. Posterior circulation: The intracranial vertebral arteries are patent to the basilar without focal stenosis. The right vertebral artery is particularly hypoplastic distal to the PICA origin. Patent PICA, AICA, and SCA origins are identified bilaterally. The basilar artery is widely patent. There are patent posterior communicating arteries bilaterally, right slightly larger than left. PCAs are patent without evidence of significant stenosis. No aneurysm is identified. Venous sinuses: Patent. Anatomic variants: None of significance. Delayed phase: No abnormal enhancement. Review of the MIP images confirms the above findings IMPRESSION: 1. No evidence of acute intracranial abnormality. 2. Mild-to-moderate chronic small vessel ischemic disease. 3. Mild intracranial and cervical atherosclerosis without major vessel occlusion or significant stenosis. Electronically Signed   By: ALogan BoresM.D.   On: 11/03/2017 17:12    Pending  Labs Unresulted Labs (From admission, onward)   Start     Ordered   11/04/17 0500  Vitamin B12  (Anemia Panel (PNL))  Tomorrow morning,   R     11/03/17 2216   11/04/17 0500  Folate  (Anemia Panel (PNL))  Tomorrow morning,   R     11/03/17 2216   11/04/17 0500  Iron and TIBC  (Anemia Panel (PNL))  Tomorrow morning,   R     11/03/17 2216   11/04/17 0500  Ferritin  (Anemia Panel (PNL))  Tomorrow morning,  R     11/03/17 2216   11/04/17 0500  Reticulocytes  (Anemia Panel (PNL))  Tomorrow morning,   R     11/03/17 2216   11/03/17 2151  Hemoglobin and hematocrit, blood  Now then every 12 hours,   R     11/03/17 2151      Vitals/Pain Today's Vitals   11/03/17 2103 11/03/17 2130 11/03/17 2200 11/03/17 2216  BP:  128/62 131/65   Pulse:   83   Resp:  (!) 23 15   Temp:    98.3 F (36.8 C)  TempSrc:    Oral  SpO2:   100%   Weight:      Height:      PainSc: 0-No pain   3     Isolation Precautions No active isolations  Medications Medications  iopamidol (ISOVUE-370) 76 % injection (has no administration in time range)  pantoprazole (PROTONIX) injection 40 mg (40 mg Intravenous Given 11/03/17 2215)  0.9 %  sodium chloride infusion ( Intravenous Stopped 11/03/17 1900)  iopamidol (ISOVUE-370) 76 % injection 80 mL (80 mLs Intravenous Contrast Given 11/03/17 1632)    Mobility walks

## 2017-11-03 NOTE — Progress Notes (Signed)
LLE venous duplex prelim: negative for DVT. Rohil Lesch Eunice, RDMS, RVT  

## 2017-11-04 DIAGNOSIS — R0602 Shortness of breath: Secondary | ICD-10-CM | POA: Diagnosis not present

## 2017-11-04 DIAGNOSIS — Z79899 Other long term (current) drug therapy: Secondary | ICD-10-CM | POA: Diagnosis not present

## 2017-11-04 DIAGNOSIS — E669 Obesity, unspecified: Secondary | ICD-10-CM | POA: Diagnosis present

## 2017-11-04 DIAGNOSIS — E876 Hypokalemia: Secondary | ICD-10-CM | POA: Diagnosis not present

## 2017-11-04 DIAGNOSIS — C9000 Multiple myeloma not having achieved remission: Secondary | ICD-10-CM | POA: Diagnosis not present

## 2017-11-04 DIAGNOSIS — Z90710 Acquired absence of both cervix and uterus: Secondary | ICD-10-CM | POA: Diagnosis not present

## 2017-11-04 DIAGNOSIS — I10 Essential (primary) hypertension: Secondary | ICD-10-CM | POA: Diagnosis not present

## 2017-11-04 DIAGNOSIS — M1990 Unspecified osteoarthritis, unspecified site: Secondary | ICD-10-CM

## 2017-11-04 DIAGNOSIS — M25561 Pain in right knee: Secondary | ICD-10-CM | POA: Diagnosis not present

## 2017-11-04 DIAGNOSIS — D125 Benign neoplasm of sigmoid colon: Secondary | ICD-10-CM | POA: Diagnosis present

## 2017-11-04 DIAGNOSIS — D539 Nutritional anemia, unspecified: Secondary | ICD-10-CM | POA: Diagnosis not present

## 2017-11-04 DIAGNOSIS — K639 Disease of intestine, unspecified: Secondary | ICD-10-CM | POA: Diagnosis not present

## 2017-11-04 DIAGNOSIS — R2681 Unsteadiness on feet: Secondary | ICD-10-CM | POA: Diagnosis not present

## 2017-11-04 DIAGNOSIS — I499 Cardiac arrhythmia, unspecified: Secondary | ICD-10-CM | POA: Diagnosis not present

## 2017-11-04 DIAGNOSIS — N179 Acute kidney failure, unspecified: Secondary | ICD-10-CM | POA: Diagnosis not present

## 2017-11-04 DIAGNOSIS — D649 Anemia, unspecified: Secondary | ICD-10-CM | POA: Diagnosis not present

## 2017-11-04 DIAGNOSIS — M6281 Muscle weakness (generalized): Secondary | ICD-10-CM | POA: Diagnosis not present

## 2017-11-04 DIAGNOSIS — C9002 Multiple myeloma in relapse: Secondary | ICD-10-CM | POA: Diagnosis not present

## 2017-11-04 DIAGNOSIS — K31819 Angiodysplasia of stomach and duodenum without bleeding: Secondary | ICD-10-CM | POA: Diagnosis not present

## 2017-11-04 DIAGNOSIS — R195 Other fecal abnormalities: Secondary | ICD-10-CM | POA: Diagnosis not present

## 2017-11-04 DIAGNOSIS — K922 Gastrointestinal hemorrhage, unspecified: Secondary | ICD-10-CM | POA: Diagnosis not present

## 2017-11-04 DIAGNOSIS — R1900 Intra-abdominal and pelvic swelling, mass and lump, unspecified site: Secondary | ICD-10-CM | POA: Diagnosis not present

## 2017-11-04 DIAGNOSIS — R531 Weakness: Secondary | ICD-10-CM

## 2017-11-04 DIAGNOSIS — D509 Iron deficiency anemia, unspecified: Secondary | ICD-10-CM | POA: Diagnosis not present

## 2017-11-04 DIAGNOSIS — C189 Malignant neoplasm of colon, unspecified: Secondary | ICD-10-CM | POA: Diagnosis not present

## 2017-11-04 DIAGNOSIS — Z6830 Body mass index (BMI) 30.0-30.9, adult: Secondary | ICD-10-CM | POA: Diagnosis not present

## 2017-11-04 DIAGNOSIS — K921 Melena: Secondary | ICD-10-CM | POA: Diagnosis not present

## 2017-11-04 DIAGNOSIS — Z515 Encounter for palliative care: Secondary | ICD-10-CM | POA: Diagnosis not present

## 2017-11-04 DIAGNOSIS — R739 Hyperglycemia, unspecified: Secondary | ICD-10-CM | POA: Diagnosis not present

## 2017-11-04 DIAGNOSIS — D62 Acute posthemorrhagic anemia: Secondary | ICD-10-CM | POA: Diagnosis not present

## 2017-11-04 DIAGNOSIS — Z8249 Family history of ischemic heart disease and other diseases of the circulatory system: Secondary | ICD-10-CM | POA: Diagnosis not present

## 2017-11-04 DIAGNOSIS — E860 Dehydration: Secondary | ICD-10-CM | POA: Diagnosis not present

## 2017-11-04 DIAGNOSIS — Z87891 Personal history of nicotine dependence: Secondary | ICD-10-CM | POA: Diagnosis not present

## 2017-11-04 DIAGNOSIS — M2550 Pain in unspecified joint: Secondary | ICD-10-CM | POA: Diagnosis not present

## 2017-11-04 DIAGNOSIS — M1711 Unilateral primary osteoarthritis, right knee: Secondary | ICD-10-CM | POA: Diagnosis present

## 2017-11-04 DIAGNOSIS — D630 Anemia in neoplastic disease: Secondary | ICD-10-CM | POA: Diagnosis present

## 2017-11-04 DIAGNOSIS — K6289 Other specified diseases of anus and rectum: Secondary | ICD-10-CM | POA: Diagnosis not present

## 2017-11-04 DIAGNOSIS — F05 Delirium due to known physiological condition: Secondary | ICD-10-CM | POA: Diagnosis present

## 2017-11-04 DIAGNOSIS — E871 Hypo-osmolality and hyponatremia: Secondary | ICD-10-CM | POA: Diagnosis not present

## 2017-11-04 DIAGNOSIS — R41841 Cognitive communication deficit: Secondary | ICD-10-CM | POA: Diagnosis not present

## 2017-11-04 DIAGNOSIS — E785 Hyperlipidemia, unspecified: Secondary | ICD-10-CM | POA: Diagnosis present

## 2017-11-04 DIAGNOSIS — Z66 Do not resuscitate: Secondary | ICD-10-CM | POA: Diagnosis present

## 2017-11-04 DIAGNOSIS — G9341 Metabolic encephalopathy: Secondary | ICD-10-CM | POA: Diagnosis present

## 2017-11-04 DIAGNOSIS — Z8673 Personal history of transient ischemic attack (TIA), and cerebral infarction without residual deficits: Secondary | ICD-10-CM | POA: Diagnosis not present

## 2017-11-04 DIAGNOSIS — R5383 Other fatigue: Secondary | ICD-10-CM | POA: Diagnosis not present

## 2017-11-04 DIAGNOSIS — D128 Benign neoplasm of rectum: Secondary | ICD-10-CM | POA: Diagnosis not present

## 2017-11-04 DIAGNOSIS — G8929 Other chronic pain: Secondary | ICD-10-CM | POA: Diagnosis not present

## 2017-11-04 DIAGNOSIS — K828 Other specified diseases of gallbladder: Secondary | ICD-10-CM | POA: Diagnosis not present

## 2017-11-04 DIAGNOSIS — Z7401 Bed confinement status: Secondary | ICD-10-CM | POA: Diagnosis not present

## 2017-11-04 DIAGNOSIS — K648 Other hemorrhoids: Secondary | ICD-10-CM | POA: Diagnosis not present

## 2017-11-04 LAB — IRON AND TIBC
Iron: 47 ug/dL (ref 28–170)
SATURATION RATIOS: 18 % (ref 10.4–31.8)
TIBC: 261 ug/dL (ref 250–450)
UIBC: 214 ug/dL

## 2017-11-04 LAB — CBC
HCT: 19.2 % — ABNORMAL LOW (ref 36.0–46.0)
HCT: 20.5 % — ABNORMAL LOW (ref 36.0–46.0)
Hemoglobin: 6.2 g/dL — CL (ref 12.0–15.0)
Hemoglobin: 6.6 g/dL — CL (ref 12.0–15.0)
MCH: 33.2 pg (ref 26.0–34.0)
MCH: 32.7 pg (ref 26.0–34.0)
MCHC: 32.3 g/dL (ref 30.0–36.0)
MCHC: 32.2 g/dL (ref 30.0–36.0)
MCV: 102.7 fL — ABNORMAL HIGH (ref 78.0–100.0)
MCV: 101.5 fL — ABNORMAL HIGH (ref 78.0–100.0)
Platelets: 172 10*3/uL (ref 150–400)
Platelets: 183 10*3/uL (ref 150–400)
RBC: 1.87 MIL/uL — ABNORMAL LOW (ref 3.87–5.11)
RBC: 2.02 MIL/uL — ABNORMAL LOW (ref 3.87–5.11)
RDW: 16.7 % — ABNORMAL HIGH (ref 11.5–15.5)
RDW: 16.7 % — ABNORMAL HIGH (ref 11.5–15.5)
WBC: 4.3 10*3/uL (ref 4.0–10.5)
WBC: 5.6 10*3/uL (ref 4.0–10.5)

## 2017-11-04 LAB — RETICULOCYTES
RBC.: 1.75 MIL/uL — ABNORMAL LOW (ref 3.87–5.11)
RETIC CT PCT: 1.5 % (ref 0.4–3.1)
Retic Count, Absolute: 26.3 10*3/uL (ref 19.0–186.0)

## 2017-11-04 LAB — FOLATE: Folate: 24.1 ng/mL (ref >5.9)

## 2017-11-04 LAB — VITAMIN B12: Vitamin B-12: 575 pg/mL (ref 180–914)

## 2017-11-04 LAB — FERRITIN: FERRITIN: 17 ng/mL (ref 11–307)

## 2017-11-04 MED ORDER — PANTOPRAZOLE SODIUM 40 MG IV SOLR
40.0000 mg | INTRAVENOUS | Status: AC
  Administered 2017-11-04: 40 mg via INTRAVENOUS
  Filled 2017-11-04: qty 40

## 2017-11-04 MED ORDER — PEG-KCL-NACL-NASULF-NA ASC-C 100 G PO SOLR: 1.0000 | Freq: Once | ORAL | Status: DC

## 2017-11-04 MED ORDER — PEG-KCL-NACL-NASULF-NA ASC-C 100 G PO SOLR
0.5000 | Freq: Once | ORAL | Status: AC
  Administered 2017-11-04: 100 g via ORAL

## 2017-11-04 MED ORDER — PEG-KCL-NACL-NASULF-NA ASC-C 100 G PO SOLR
0.5000 | Freq: Every day | ORAL | Status: DC
  Administered 2017-11-04: 100 g via ORAL
  Filled 2017-11-04: qty 1

## 2017-11-04 MED ORDER — SODIUM CHLORIDE 0.9 % IV SOLN
8.0000 mg/h | INTRAVENOUS | Status: DC
  Administered 2017-11-04 – 2017-11-06 (×7): 8 mg/h via INTRAVENOUS
  Filled 2017-11-04 (×10): qty 80

## 2017-11-04 MED ORDER — TRAZODONE HCL 50 MG PO TABS
50.0000 mg | ORAL_TABLET | Freq: Every day | ORAL | Status: DC
  Administered 2017-11-05 – 2017-11-15 (×12): 50 mg via ORAL
  Filled 2017-11-04 (×13): qty 1

## 2017-11-04 MED ORDER — SODIUM CHLORIDE 0.9 % IV SOLN
INTRAVENOUS | Status: AC
  Administered 2017-11-04: 01:00:00 via INTRAVENOUS

## 2017-11-04 MED ORDER — PANTOPRAZOLE SODIUM 40 MG IV SOLR
40.0000 mg | Freq: Two times a day (BID) | INTRAVENOUS | Status: DC
  Administered 2017-11-07 – 2017-11-08 (×3): 40 mg via INTRAVENOUS
  Filled 2017-11-04 (×3): qty 40

## 2017-11-04 MED ORDER — DIPHENHYDRAMINE HCL 12.5 MG/5ML PO ELIX
12.5000 mg | ORAL_SOLUTION | Freq: Once | ORAL | Status: AC
  Administered 2017-11-04: 12.5 mg via ORAL
  Filled 2017-11-04: qty 5

## 2017-11-04 NOTE — Consult Note (Addendum)
Consultation  Referring Provider:  Dr. Marthenia Rolling    Primary Care Physician:  Alain Marion Evie Lacks, MD Primary Gastroenterologist:  Dr. Carlean Purl       Reason for Consultation:  Symptomatic Anemia with rectal bleeding           HPI:   Mindy Taylor is a 84 y.o. female with past medical history significant for multiple myeloma, CVA, HTN, who presented to the ED on 11/03/17 with a complaint of fatigue, shortness of breath with exertion and dizziness for 3 weeks.     Today, explains that for the past month she has had increasing shortness of breath with exertion, dizziness and just "not feeling right".  Patient also described a "hurting in my chest" when she would walk.  Patient tell me it "feels like I am dying".  Describes vague history of multiple myeloma diagnosis and trying some medicine which was "too harsh for my body", as well as a nosebleed and "bleeding behind my eye", not sure how recently.  Also tells me she has not been sleeping for the past month or so and has been pacing her house at night. Patient denies any acute change in bowel habits but has been seeing some bright red blood with stool which she thought was from "thin tissue back there" over the past couple of months when wiping.    Denies weight loss, fever, chills, nausea, vomiting, heartburn, reflux or abdominal pain.  ED Course: hgb 6.4 (7.8 two mos ago, baseline ~10/11), ED noted some gross blood in rectum, pt refused blood transfusion as she is Jehovah's witness  GI History: 04/11/2004 Dr. Carlean Purl colonoscopy: 25 mm sessile polyp in the sigmoid colon, grade 2 external hemorrhoids; pathology: Fragments of tubulovillous adenomatous polyp, no high-grade dysplasia or invasive malignancy  Past Medical History:  Diagnosis Date  . ANXIETY 03/05/2007  . ATAXIA 12/26/2009  . BEE STING 03/05/2007  . BRONCHITIS, ACUTE 06/28/2010  . CANDIDIASIS, VAGINAL 09/08/2008  . CEREBROVASCULAR ACCIDENT, HX OF 01/04/2010  . CERUMEN IMPACTION  06/05/2008  . DIZZINESS 12/26/2009  . Epistaxis 06/05/2008  . Headache(784.0) 06/05/2008  . HYPERLIPIDEMIA 12/08/2006  . HYPERTENSION 12/08/2006  . 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 Multiple myeloma ([place]) 04/03/2015  . NECK PAIN 06/05/2008  . OBESITY 09/07/2009  . ONYCHOMYCOSIS 03/09/2009  . OSTEOARTHRITIS 12/08/2006  . PARESTHESIA 12/26/2009  . TOBACCO USE, QUIT 03/09/2009    Past Surgical History:  Procedure Laterality Date  . ABDOMINAL HYSTERECTOMY    . BREAST SURGERY      Family History  Problem Relation Age of Onset  . Hypertension Mother   . Hypertension Father   . Hypertension Other      Social History   Tobacco Use  . Smoking status: Former Research scientist (life sciences)  . Smokeless tobacco: Never Used  . Tobacco comment: Not sure when; many, many years   Substance Use Topics  . Alcohol use: No    Alcohol/week: 0.0 oz  . Drug use: No    Prior to Admission medications   Medication Sig Start Date End Date Taking? Authorizing Provider  amLODipine (NORVASC) 5 MG tablet TAKE 1 TABLET DAILY 06/30/17  Yes Plotnikov, Evie Lacks, MD  Cholecalciferol (VITAMIN D) 1000 UNITS capsule Take 1 capsule (1,000 Units total) by mouth daily. 01/03/11  Yes Plotnikov, Evie Lacks, MD  fish oil-omega-3 fatty acids 1000 MG capsule Take 1 g by mouth daily.     Yes [provider]  HYDROcodone-acetaminophen (NORCO/VICODIN) 5-325 MG tablet Take 0.5-1 tablets by mouth  every 6 (six) hours as needed for severe pain. 10/12/15  Yes Plotnikov, Evie Lacks, MD  Multiple Vitamin (MULTIVITAMIN) tablet Take 1 tablet by mouth daily.     Yes [provider]  olmesartan (BENICAR) 40 MG tablet Take 1 tablet (40 mg total) by mouth daily. 09/04/17  Yes Plotnikov, Evie Lacks, MD  pravastatin (PRAVACHOL) 40 MG tablet TAKE 1 TABLET DAILY 06/01/17  Yes Plotnikov, Evie Lacks, MD  TOPROL XL 50 MG 24 hr tablet TAKE 1 TABLET TWICE A DAY 08/31/17  Yes Plotnikov, Evie Lacks, MD  vitamin C (ASCORBIC ACID) 500 MG tablet Take 500 mg by mouth daily.      Yes [provider]    Current Facility-Administered Medications  Medication Dose Route Frequency Provider Last Rate Last Dose  . 0.9 %  sodium chloride infusion   Intravenous Continuous Emokpae, Ejiroghene E, MD 75 mL/hr at 11/04/17 0043    . ondansetron (ZOFRAN) tablet 4 mg  4 mg Oral Q6H PRN Emokpae, Ejiroghene E, MD       Or  . ondansetron (ZOFRAN) injection 4 mg  4 mg Intravenous Q6H PRN Emokpae, Ejiroghene E, MD      . pantoprazole (PROTONIX) 80 mg in sodium chloride 0.9 % 250 mL (0.32 mg/mL) infusion  8 mg/hr Intravenous Continuous Emokpae, Ejiroghene E, MD 25 mL/hr at 11/04/17 0126 8 mg/hr at 11/04/17 0126  . [START ON 11/07/2017] pantoprazole (PROTONIX) injection 40 mg  40 mg Intravenous Q12H Emokpae, Ejiroghene E, MD      . pravastatin (PRAVACHOL) tablet 40 mg  40 mg Oral q1800 Emokpae, Ejiroghene E, MD      . traZODone (DESYREL) tablet 50 mg  50 mg Oral QHS Emokpae, Ejiroghene E, MD        Allergies as of 11/03/2017 - Review Complete 11/03/2017  Allergen Reaction Noted  . Bee venom Swelling 09/21/2012  . Ceftin [cefuroxime axetil] Other (See Comments) 07/26/2013  . Other  11/03/2017  . Spider antivenin [black widow spider antivenin (l.mactans)] Swelling and Other (See Comments) 09/21/2012     Review of Systems:    Constitutional: No weight loss, fever or chills Skin: No rash  Cardiovascular: +c/p Respiratory: +DOE Gastrointestinal: See HPI and otherwise negative Genitourinary: No dysuria  Neurological: No headache Musculoskeletal: No new muscle or joint pain Hematologic: No bruising Psychiatric: No history of depression or anxiety   Physical Exam:  Vital signs in last 24 hours: Temp:  [98.3 F (36.8 C)-99.5 F (37.5 C)] 98.5 F (36.9 C) (06/12 0435) Pulse Rate:  [79-95] 95 (06/12 0435) Resp:  [14-23] 20 (06/12 0435) BP: (124-168)/(58-80) 154/79 (06/12 0435) SpO2:  [96 %-100 %] 96 % (06/12 0435) Weight:  [192 lb 12.8 oz (87.5 kg)-195 lb (88.5 kg)] 192  lb 12.8 oz (87.5 kg) (06/11 2337) Last BM Date: 11/02/17 General:   Pleasant AA female appears to be in NAD, Well developed, Well nourished, alert and cooperative Head:  Normocephalic and atraumatic. Eyes:   PEERL, EOMI. No icterus. Conjunctiva pink. Ears:  Normal auditory acuity. Neck:  Supple Throat: Oral cavity and pharynx without inflammation, swelling or lesion.  Lungs: Respirations even and unlabored. Lungs clear to auscultation bilaterally.   No wheezes, crackles, or rhonchi.  Heart: Normal S1, S2. No MRG. Regular rate and rhythm. No peripheral edema, cyanosis or pallor.  Abdomen:  Soft, nondistended, nontender. No rebound or guarding. Normal bowel sounds. No appreciable masses or hepatomegaly. Rectal:  Not performed.  Msk:  Symmetrical without gross deformities. Peripheral pulses intact.  Extremities:  1+ pitting edema b/l legs, Normal ROM, normal sensation. Neurologic:  Alert and  oriented x4;  grossly normal neurologically.  Skin:   Dry and intact without significant lesions or rashes. Psychiatric: Demonstrates good judgement and reason without abnormal affect or behaviors.   LAB RESULTS: Recent Labs    11/03/17 1344 11/03/17 2224  WBC 5.5  --   HGB 6.4* 6.0*  HCT 19.6* 18.9*  PLT 181  --    BMET Recent Labs    11/03/17 1344  NA 136  K 4.3  CL 108  CO2 26  GLUCOSE 104*  BUN 21*  CREATININE 1.23*  CALCIUM 8.8*   LFT Recent Labs    11/03/17 1703  PROT 12.0*  ALBUMIN 2.8*  AST 19  ALT 15  ALKPHOS 45  BILITOT 0.6  BILIDIR <0.1*  IBILI NOT CALCULATED   STUDIES: Ct Angio Head W Or Wo Contrast  Result Date: 11/03/2017 CLINICAL DATA:  Dizziness, headache, imbalance, and generalized weakness for 1 month. EXAM: CT ANGIOGRAPHY HEAD AND NECK TECHNIQUE: Multidetector CT imaging of the head and neck was performed using the standard protocol during bolus administration of intravenous contrast. Multiplanar CT image reconstructions and MIPs were obtained to evaluate  the vascular anatomy. Carotid stenosis measurements (when applicable) are obtained utilizing NASCET criteria, using the distal internal carotid diameter as the denominator. CONTRAST:  84m ISOVUE-370 IOPAMIDOL (ISOVUE-370) INJECTION 76% COMPARISON:  Neck CTA 04/04/2011.  Brain MRI 01/02/2010. FINDINGS: CT HEAD FINDINGS Brain: There is no evidence of acute infarct, intracranial hemorrhage, mass, midline shift, or extra-axial fluid collection. The ventricles and sulci are normal for age. Patchy cerebral white matter hypodensities are nonspecific but compatible with chronic small vessel ischemic disease, mild-to-moderate for age and similar in distribution to the T2 hyperintensities on the prior MRI. Vascular: Calcified atherosclerosis at the skull base. No hyperdense vessel. Skull: No fracture or focal osseous lesion. Sinuses: Visualized paranasal sinuses and mastoid air cells are clear. Orbits: Unremarkable. Review of the MIP images confirms the above findings CTA NECK FINDINGS Aortic arch: There is a standard 3 vessel aortic arch. Arch vessel origins are widely patent. Right carotid system: Patent without evidence of stenosis or dissection. Mild atherosclerotic plaque in the carotid bulb. Tortuous proximal common carotid artery and mid cervical ICA. Left carotid system: Patent without evidence of stenosis or dissection. Mild calcified plaque at the carotid bifurcation. Vertebral arteries: Patent without evidence of stenosis or dissection. Strongly dominant left vertebral artery with focal nonstenotic calcified plaque at its origin. Skeleton: Chronically advanced disc degeneration at C6-7 greater than C5-6. Moderate to advanced cervical facet arthrosis with unchanged grade 1 anterolisthesis of C4 on C5. Other neck: Similar appearance of left thyroid goiter which posteriorly displaces the proximal left common carotid and left subclavian arteries. Upper chest: Clear lung apices. Review of the MIP images confirms the  above findings CTA HEAD FINDINGS Anterior circulation: The internal carotid arteries are patent from skull base to carotid termini with mild nonstenotic siphon atherosclerosis bilaterally. ACAs and MCAs are patent without evidence of proximal branch occlusion or flow limiting proximal stenosis. There may be a scratched of there may be an incidental fenestration of the distal right A1 segment, a normal variant. No aneurysm is identified. Posterior circulation: The intracranial vertebral arteries are patent to the basilar without focal stenosis. The right vertebral artery is particularly hypoplastic distal to the PICA origin. Patent PICA, AICA, and SCA origins are identified bilaterally. The basilar artery is widely patent. There are patent posterior  communicating arteries bilaterally, right slightly larger than left. PCAs are patent without evidence of significant stenosis. No aneurysm is identified. Venous sinuses: Patent. Anatomic variants: None of significance. Delayed phase: No abnormal enhancement. Review of the MIP images confirms the above findings IMPRESSION: 1. No evidence of acute intracranial abnormality. 2. Mild-to-moderate chronic small vessel ischemic disease. 3. Mild intracranial and cervical atherosclerosis without major vessel occlusion or significant stenosis. Electronically Signed   By: Logan Bores M.D.   On: 11/03/2017 17:12   Ct Angio Neck W And/or Wo Contrast  Result Date: 11/03/2017 CLINICAL DATA:  Dizziness, headache, imbalance, and generalized weakness for 1 month. EXAM: CT ANGIOGRAPHY HEAD AND NECK TECHNIQUE: Multidetector CT imaging of the head and neck was performed using the standard protocol during bolus administration of intravenous contrast. Multiplanar CT image reconstructions and MIPs were obtained to evaluate the vascular anatomy. Carotid stenosis measurements (when applicable) are obtained utilizing NASCET criteria, using the distal internal carotid diameter as the  denominator. CONTRAST:  42m ISOVUE-370 IOPAMIDOL (ISOVUE-370) INJECTION 76% COMPARISON:  Neck CTA 04/04/2011.  Brain MRI 01/02/2010. FINDINGS: CT HEAD FINDINGS Brain: There is no evidence of acute infarct, intracranial hemorrhage, mass, midline shift, or extra-axial fluid collection. The ventricles and sulci are normal for age. Patchy cerebral white matter hypodensities are nonspecific but compatible with chronic small vessel ischemic disease, mild-to-moderate for age and similar in distribution to the T2 hyperintensities on the prior MRI. Vascular: Calcified atherosclerosis at the skull base. No hyperdense vessel. Skull: No fracture or focal osseous lesion. Sinuses: Visualized paranasal sinuses and mastoid air cells are clear. Orbits: Unremarkable. Review of the MIP images confirms the above findings CTA NECK FINDINGS Aortic arch: There is a standard 3 vessel aortic arch. Arch vessel origins are widely patent. Right carotid system: Patent without evidence of stenosis or dissection. Mild atherosclerotic plaque in the carotid bulb. Tortuous proximal common carotid artery and mid cervical ICA. Left carotid system: Patent without evidence of stenosis or dissection. Mild calcified plaque at the carotid bifurcation. Vertebral arteries: Patent without evidence of stenosis or dissection. Strongly dominant left vertebral artery with focal nonstenotic calcified plaque at its origin. Skeleton: Chronically advanced disc degeneration at C6-7 greater than C5-6. Moderate to advanced cervical facet arthrosis with unchanged grade 1 anterolisthesis of C4 on C5. Other neck: Similar appearance of left thyroid goiter which posteriorly displaces the proximal left common carotid and left subclavian arteries. Upper chest: Clear lung apices. Review of the MIP images confirms the above findings CTA HEAD FINDINGS Anterior circulation: The internal carotid arteries are patent from skull base to carotid termini with mild nonstenotic siphon  atherosclerosis bilaterally. ACAs and MCAs are patent without evidence of proximal branch occlusion or flow limiting proximal stenosis. There may be a scratched of there may be an incidental fenestration of the distal right A1 segment, a normal variant. No aneurysm is identified. Posterior circulation: The intracranial vertebral arteries are patent to the basilar without focal stenosis. The right vertebral artery is particularly hypoplastic distal to the PICA origin. Patent PICA, AICA, and SCA origins are identified bilaterally. The basilar artery is widely patent. There are patent posterior communicating arteries bilaterally, right slightly larger than left. PCAs are patent without evidence of significant stenosis. No aneurysm is identified. Venous sinuses: Patent. Anatomic variants: None of significance. Delayed phase: No abnormal enhancement. Review of the MIP images confirms the above findings IMPRESSION: 1. No evidence of acute intracranial abnormality. 2. Mild-to-moderate chronic small vessel ischemic disease. 3. Mild intracranial and cervical atherosclerosis without  major vessel occlusion or significant stenosis. Electronically Signed   By: Logan Bores M.D.   On: 11/03/2017 17:12     Impression / Plan:   Impression: 1. Symptomatic Anemia: hgb 6.4--> 6.0 overnight (baseline 10/11), SOB, Dizziness, pt refusing transfusion-Jehovah's Witness, plan for Ferritin and Iron 2. Multiple myeloma  Plan: 1. Continue supportive measures including plans for ferritin and iron 2.  Again discussed blood products with the patient today.  She refused and is aware that without the blood products she may not be able to have any further procedures under sedation and there is a risk of death. 3.  Continue IV Protonix. 4.  Anesthesiology agrees to proceed with Colonoscopy and EGD tomorrow with patient's hgb of 6. Went ahead and discussed risks, benefits, limitations and alternatives and the patient agrees to proceed.  Movi prep ordered and patient to remain on clears, then NPO after midnight. 5.  Please await further recommendations from Dr. Havery Moros later today.  Thank you for your kind consultation, we will continue to follow.  Lavone Nian Jefferson Fullam  11/04/2017, 8:45 AM Pager #: [phone]

## 2017-11-04 NOTE — Progress Notes (Signed)
Initial Nutrition Assessment  DOCUMENTATION CODES:   Obesity unspecified  INTERVENTION:   Diet advancement per MD Will assess for need of supplement based on PO intakes  NUTRITION DIAGNOSIS:   Inadequate oral intake related to poor appetite as evidenced by per patient/family report.  GOAL:   Patient will meet greater than or equal to 90% of their needs  MONITOR:   Diet advancement, Weight trends, Labs, I & O's  REASON FOR ASSESSMENT:   Malnutrition Screening Tool    ASSESSMENT:   84 y.o. female with medical history significant for multiple myeloma, CVA, HTN, who presented to the ED with complaints of fatigue, shortness of breath with exertion, and dizziness, of 3 weeks duration.  Also reports left lower extremity swelling over the past 3 weeks.  Patient currently NPO. Per GI note, pt to have colonoscopy and EGD 6/13. Pt reports poor appetite PTA.  Per weight records, pt has lost 15 lb since January 2019 (7% wt loss x 5 months, insignificant for time frame).   Will assess for need of supplements once diet is advanced.  Labs reviewed. Medications reviewed.  NUTRITION - FOCUSED PHYSICAL EXAM:  Nutrition focused physical exam shows no sign of depletion of muscle mass or body fat.  Diet Order:   Diet Order           Diet NPO time specified  Diet effective midnight        Diet NPO time specified  Diet effective now          EDUCATION NEEDS:   Not appropriate for education at this time  Skin:  Skin Assessment: Reviewed RN Assessment  Last BM:  6/10  Height:   Ht Readings from Last 1 Encounters:  11/03/17 _0  (1.702 m)    Weight:   Wt Readings from Last 1 Encounters:  11/03/17 192 lb 12.8 oz (87.5 kg)    Ideal Body Weight:  61.3 kg  BMI:  Body mass index is 30.2 kg/m.  Estimated Nutritional Needs:   Kcal:  1700-1900  Protein:  65-75g  Fluid:  1.7L/day  Clayton Bibles, MS, RD, LDN [place] [facility] Pager:  [phone] After Hours Pager: [phone]

## 2017-11-04 NOTE — Consult Note (Signed)
Reason for Referral: Anemia  HPI: 84 year old woman currently resides in [facility] independently.  She has a history of hypertension and osteoarthritis but for the most part has been in reasonable health.  She was diagnosed with multiple myeloma 2016 at that time she had an IgG level of 2300 and 20% plasma cell involvement in her bone marrow.  He was evaluated by Dr. Marin Olp and declined treatment at that time.  She has been asymptomatic at the time of diagnosis and shortly after that.  Over the last few years she has noted to have increase in anemia with a hemoglobin have been between 10 and 11 was 7.8 in April 2019 and down to 6.4 when she was hospitalized on 11/03/2017.  She noted to have rectal bleeding as well as symptomatic dyspnea on exertion.  She denies any bone pain or pathological fractures.  She denies any hemoptysis or hematemesis.  She does not report any headaches, blurry vision, syncope or seizures. Does not report any fevers, chills or sweats.  Does not report any cough, wheezing or hemoptysis.  Does not report any chest pain, palpitation, orthopnea or leg edema.  Does not report any nausea, vomiting or abdominal pain.  Does not report any constipation or diarrhea.  Does not report any skeletal complaints.    Does not report frequency, urgency or hematuria.  Does not report any skin rashes or lesions. Does not report any heat or cold intolerance.  Does not report any lymphadenopathy or petechiae.  Does not report any anxiety or depression.  Remaining review of systems is negative.    Past Medical History:  Diagnosis Date  . ANXIETY 03/05/2007  . ATAXIA 12/26/2009  . BEE STING 03/05/2007  . BRONCHITIS, ACUTE 06/28/2010  . CANDIDIASIS, VAGINAL 09/08/2008  . CEREBROVASCULAR ACCIDENT, HX OF 01/04/2010  . CERUMEN IMPACTION 06/05/2008  . DIZZINESS 12/26/2009  . Epistaxis 06/05/2008  . Headache(784.0) 06/05/2008  . HYPERLIPIDEMIA 12/08/2006  . HYPERTENSION 12/08/2006  . LACUNAR INFARCTION  01/04/2010  . Multiple myeloma ([place]) 04/03/2015  . NECK PAIN 06/05/2008  . OBESITY 09/07/2009  . ONYCHOMYCOSIS 03/09/2009  . OSTEOARTHRITIS 12/08/2006  . PARESTHESIA 12/26/2009  . TOBACCO USE, QUIT 03/09/2009  :  Past Surgical History:  Procedure Laterality Date  . ABDOMINAL HYSTERECTOMY    . BREAST SURGERY    :   Current Facility-Administered Medications:  .  ondansetron (ZOFRAN) tablet 4 mg, 4 mg, Oral, Q6H PRN **OR** ondansetron (ZOFRAN) injection 4 mg, 4 mg, Intravenous, Q6H PRN, Emokpae, Ejiroghene E, MD .  pantoprazole (PROTONIX) 80 mg in sodium chloride 0.9 % 250 mL (0.32 mg/mL) infusion, 8 mg/hr, Intravenous, Continuous, Emokpae, Ejiroghene E, MD, Last Rate: 25 mL/hr at 11/04/17 1055, 8 mg/hr at 11/04/17 1055 .  [START ON 11/07/2017] pantoprazole (PROTONIX) injection 40 mg, 40 mg, Intravenous, Q12H, Emokpae, Ejiroghene E, MD .  peg 3350 powder (MOVIPREP) kit 100 g, 0.5 kit, Oral, Q1400, 100 g at 11/04/17 1409 **AND** peg 3350 powder (MOVIPREP) kit 100 g, 0.5 kit, Oral, Once, Lenis Noon, RPH .  pravastatin (PRAVACHOL) tablet 40 mg, 40 mg, Oral, q1800, Emokpae, Ejiroghene E, MD .  traZODone (DESYREL) tablet 50 mg, 50 mg, Oral, QHS, Emokpae, Ejiroghene E, MD:  Allergies  Allergen Reactions  . Bee Venom Swelling    Swelling at site of sting  . Ceftin [Cefuroxime Axetil] Other (See Comments)    Made the patient "feel weird"  . Other     PT is a Jehovah Witness and wants no blood products.  [name]  Spider Antivenin [Black Widow Spider Antivenin (L.Mactans)] Swelling and Other (See Comments)    Pain and swelling at site of bite  :  Family History  Problem Relation Age of Onset  . Hypertension Mother   . Hypertension Father   . Hypertension Other   :  Social History   Socioeconomic History  . Marital status: Widowed    Spouse name: Not on file  . Number of children: Not on file  . Years of education: Not on file  . Highest education level: Not on file  Occupational History   . Not on file  Social Needs  . Financial resource strain: Not on file  . Food insecurity:    Worry: Not on file    Inability: Not on file  . Transportation needs:    Medical: Not on file    Non-medical: Not on file  Tobacco Use  . Smoking status: Former Research scientist (life sciences)  . Smokeless tobacco: Never Used  . Tobacco comment: Not sure when; many, many years   Substance and Sexual Activity  . Alcohol use: No    Alcohol/week: 0.0 oz  . Drug use: No  . Sexual activity: Never  Lifestyle  . Physical activity:    Days per week: Not on file    Minutes per session: Not on file  . Stress: Not on file  Relationships  . Social connections:    Talks on phone: Not on file    Gets together: Not on file    Attends religious service: Not on file    Active member of club or organization: Not on file    Attends meetings of clubs or organizations: Not on file    Relationship status: Not on file  . Intimate partner violence:    Fear of current or ex partner: Not on file    Emotionally abused: Not on file    Physically abused: Not on file    Forced sexual activity: Not on file  Other Topics Concern  . Not on file  Social History Narrative  . Not on file  :  Pertinent items are noted in HPI.  Exam: Blood pressure (!) 154/79, pulse 95, temperature 98.5 F (36.9 C), resp. rate 20, height '5\' 7"'$  (1.702 m), weight 192 lb 12.8 oz (87.5 kg), SpO2 96 %. General appearance: alert and cooperative appeared without distress.  Appears slightly pale. Head: atraumatic without any abnormalities. Eyes: conjunctivae/corneas clear. PERRL.  Sclera anicteric. Throat: lips, mucosa, and tongue normal; without oral thrush or ulcers. Resp: clear to auscultation bilaterally without rhonchi, wheezes or dullness to percussion. Cardio: regular rate and rhythm, S1, S2 normal, no murmur, click, rub or gallop.  Lower extremity edema noted. GI: soft, non-tender; bowel sounds normal; no masses,  no organomegaly Skin: Skin color,  texture, turgor normal. No rashes or lesions Lymph nodes: Cervical, supraclavicular, and axillary nodes normal. Neurologic: Grossly normal without any motor, sensory or deep tendon reflexes. Musculoskeletal: No joint deformity or effusion.  Recent Labs    11/03/17 1344 11/03/17 2224 11/04/17 0849  WBC 5.5  --  4.3  HGB 6.4* 6.0* 6.2*  HCT 19.6* 18.9* 19.2*  PLT 181  --  172   Recent Labs    11/03/17 1344  NA 136  K 4.3  CL 108  CO2 26  GLUCOSE 104*  BUN 21*  CREATININE 1.23*  CALCIUM 8.8*      Assessment and Plan:   84 year old woman with the following issues:  1.  Multifactorial anemia: She  has an element of renal insufficiency, blood loss and anemia related to a plasma cell disorder given her diagnosis of multiple myeloma.  Her hemoglobin is around 6.2 after a drop from 7.8 in April 2019 and 8.4 in December 2018.  Etiology of her recent drop could be related to GI bleeding in addition to worsening multiple myeloma.  She is certainly symptomatic from her anemia and refusing blood product for religious reasons.  Her iron studies do not indicate chronic blood loss although her ferritin is close to borderline as well.  From a management standpoint, I recommend supportive management as you are doing with GI work-up to rule out any active source of bleeding.  In terms of replacing improving her hemoglobin, options are limited at this time.  I do recommend giving her intravenous iron despite her close to normal range iron although declining ferritin.  I will also recommend given her Procrit or Aranesp while she is hospitalized and attempt to boost her hemoglobin.  None of these measures will have any immediate effect however.  She understands the implication of not receiving blood products and continues to refuse them at this time.  2.  Multiple myeloma: She has IgG subtype diagnosed in 2017 and appears to be more symptomatic at this time as it contributing to her anemia.  I  doubt she will be a great candidate for additional therapy.  She has been seen by Dr. Marin Olp in the past and I will alert him to her hospitalization.  60  minutes was spent with the patient face-to-face today.  More than 50% of time was dedicated to patient counseling, education and coordination of her care.

## 2017-11-04 NOTE — Progress Notes (Signed)
PROGRESS NOTE    Mindy Taylor  CWC:[id number] DOB: 08-Mar-1929 DOA: 11/03/2017 PCP: Cassandria Anger, MD  Outpatient Specialists:    Brief Narrative:  Patient is a 84 y.o. female with medical history significant for multiple myeloma, CVA, HTN, and colonic polyp.  According to the patient, her last colonoscopy was about 8 years ago.  Patient is admitted with symptomatic anemia.  Patient's hemoglobin is 6.4 g/dL.  Patient reports seen but bright red and altered blood.  According to the patient, she has had intermittent rectal bleed for a very long time.  Patient is a Jehovah witness, and has refused blood transfusion.  Patient is willing to pass on, rather than have blood transfusion.  Patient has capacity to make medical decisions.  Patient clearly understands implication of not receiving blood transfusion.  GI team has been consulted.  Further management depend on hospital course.  Will also have low threshold to consult hematology team.  Patient's prognosis is guarded.   Assessment & Plan:   Active Problems:   Essential hypertension   Cerebral artery occlusion with cerebral infarction (HCC)   Multiple myeloma (HCC)   Symptomatic anemia   Symptomatic anemia: -Hemoglobin 6.4.  -Baseline Hemoglobin is 10-11g/dl .  Likely 2/2 GI bleed.  NSAID use history. EDP notes some blood in rectum.  Colonoscopy- 2005-benign large bowel neoplasia-tubulovillous adenomatous polyp, external hemorrhoids. -Patient is Jehovah witness, and refuses blood transfusion. -GI input is highly appreciated. -Hematology team consulted for none blood transfusion therapy (discussed with Dr. Alen Blew)   Multiple myeloma-patient previously followed with Dr. Sandra Cockayne, patient declined treatment since 2016 and is not ready to continue medications.  She reports medications were too strong for her, she felt worse with treatment.  HTN - Optimize.   DVT prophylaxis: SCds Code Status: DNR-confirmed patient at  bedside Family Communication:  Consults called: GI and Hematology  Procedures:   None  Antimicrobials:   None   Subjective: No new complaints.  Patient seen alongside rounding team.  Risks, including risk of demise, discussed with the patient as the patient is continuing to refuse blood transfusion due to religious reasons.  The patient is Jehovah's Witness.  Objective: Vitals:   11/03/17 2230 11/03/17 2300 11/03/17 2337 11/04/17 0435  BP: 138/61 (!) 130/58 (!) 142/74 (!) 154/79  Pulse:  79 89 95  Resp: 14 (!) _0 Temp:   99.5 F (37.5 C) 98.5 F (36.9 C)  TempSrc:   Oral   SpO2:  99% 100% 96%  Weight:   87.5 kg (192 lb 12.8 oz)   Height:   5' 7" (1.702 m)     Intake/Output Summary (Last 24 hours) at 11/04/2017 1345 Last data filed at 11/04/2017 0938 Gross per 24 hour  Intake 524.59 ml  Output -  Net 524.59 ml   Filed Weights   11/03/17 1247 11/03/17 2337  Weight: 88.5 kg (195 lb) 87.5 kg (192 lb 12.8 oz)    Examination: Eyes:  Pallor.  No jaundice ENMT: Mucous membranes are moist.  Neck: normal, supple, no masses, no thyromegaly Respiratory: clear to auscultation bilaterally, no wheezing, no crackles. Normal respiratory effort. No accessory muscle use.  Cardiovascular: Regular rate and rhythm, no murmurs / rubs / gallops.  +1 pitting edema bilat lower extremities lower third of legs.  Abdomen:  Obese, soft and nontender.  Organs are difficult to assess.   Musculoskeletal: no clubbing / cyanosis. No joint deformity upper and lower extremities. Good ROM, no contractures. Normal muscle tone.  Neurologic:  Awake, alert and oriented to time, place and person.  Patient has capacity to make medical decision.  Patient moves all limbs.  Data Reviewed: I have personally reviewed following labs and imaging studies  CBC: Recent Labs  Lab 11/03/17 1344 11/03/17 2224 11/04/17 0849  WBC 5.5  --  4.3  HGB 6.4* 6.0* 6.2*  HCT 19.6* 18.9* 19.2*  MCV 102.6*  --   102.7*  PLT 181  --  829   Basic Metabolic Panel: Recent Labs  Lab 11/03/17 1344  NA 136  K 4.3  CL 108  CO2 26  GLUCOSE 104*  BUN 21*  CREATININE 1.23*  CALCIUM 8.8*   GFR: Estimated Creatinine Clearance: 36.6 mL/min (A) (by C-G formula based on SCr of 1.23 mg/dL (H)). Liver Function Tests: Recent Labs  Lab 11/03/17 1703  AST 19  ALT 15  ALKPHOS 45  BILITOT 0.6  PROT 12.0*  ALBUMIN 2.8*   No results for input(s): LIPASE, AMYLASE in the last 168 hours. No results for input(s): AMMONIA in the last 168 hours. Coagulation Profile: No results for input(s): INR, PROTIME in the last 168 hours. Cardiac Enzymes: No results for input(s): CKTOTAL, CKMB, CKMBINDEX, TROPONINI in the last 168 hours. BNP (last 3 results) No results for input(s): PROBNP in the last 8760 hours. HbA1C: No results for input(s): HGBA1C in the last 72 hours. CBG: Recent Labs  Lab 11/03/17 1258  GLUCAP 85   Lipid Profile: No results for input(s): CHOL, HDL, LDLCALC, TRIG, CHOLHDL, LDLDIRECT in the last 72 hours. Thyroid Function Tests: No results for input(s): TSH, T4TOTAL, FREET4, T3FREE, THYROIDAB in the last 72 hours. Anemia Panel: Recent Labs    11/04/17 0355  VITAMINB12 575  FOLATE 24.1  FERRITIN 17  TIBC 261  IRON 47  RETICCTPCT 1.5   Urine analysis:    Component Value Date/Time   COLORURINE YELLOW 11/03/2017 1344   APPEARANCEUR CLEAR 11/03/2017 1344   LABSPEC 1.013 11/03/2017 1344   PHURINE 5.0 11/03/2017 1344   GLUCOSEU NEGATIVE 11/03/2017 1344   GLUCOSEU NEGATIVE 09/06/2012 0900   HGBUR LARGE (A) 11/03/2017 1344   BILIRUBINUR NEGATIVE 11/03/2017 1344   KETONESUR NEGATIVE 11/03/2017 1344   PROTEINUR NEGATIVE 11/03/2017 1344   UROBILINOGEN 0.2 03/14/2015 1300   NITRITE NEGATIVE 11/03/2017 1344   LEUKOCYTESUR NEGATIVE 11/03/2017 1344   Sepsis Labs: _0 (procalcitonin:4,lacticidven:4)  )No results found for this or any previous visit (from the past 240 hour(s)).        Radiology Studies: Ct Angio Head W Or Wo Contrast  Result Date: 11/03/2017 CLINICAL DATA:  Dizziness, headache, imbalance, and generalized weakness for 1 month. EXAM: CT ANGIOGRAPHY HEAD AND NECK TECHNIQUE: Multidetector CT imaging of the head and neck was performed using the standard protocol during bolus administration of intravenous contrast. Multiplanar CT image reconstructions and MIPs were obtained to evaluate the vascular anatomy. Carotid stenosis measurements (when applicable) are obtained utilizing NASCET criteria, using the distal internal carotid diameter as the denominator. CONTRAST:  91m ISOVUE-370 IOPAMIDOL (ISOVUE-370) INJECTION 76% COMPARISON:  Neck CTA 04/04/2011.  Brain MRI 01/02/2010. FINDINGS: CT HEAD FINDINGS Brain: There is no evidence of acute infarct, intracranial hemorrhage, mass, midline shift, or extra-axial fluid collection. The ventricles and sulci are normal for age. Patchy cerebral white matter hypodensities are nonspecific but compatible with chronic small vessel ischemic disease, mild-to-moderate for age and similar in distribution to the T2 hyperintensities on the prior MRI. Vascular: Calcified atherosclerosis at the skull base. No hyperdense vessel. Skull: No fracture or  focal osseous lesion. Sinuses: Visualized paranasal sinuses and mastoid air cells are clear. Orbits: Unremarkable. Review of the MIP images confirms the above findings CTA NECK FINDINGS Aortic arch: There is a standard 3 vessel aortic arch. Arch vessel origins are widely patent. Right carotid system: Patent without evidence of stenosis or dissection. Mild atherosclerotic plaque in the carotid bulb. Tortuous proximal common carotid artery and mid cervical ICA. Left carotid system: Patent without evidence of stenosis or dissection. Mild calcified plaque at the carotid bifurcation. Vertebral arteries: Patent without evidence of stenosis or dissection. Strongly dominant left vertebral artery with focal  nonstenotic calcified plaque at its origin. Skeleton: Chronically advanced disc degeneration at C6-7 greater than C5-6. Moderate to advanced cervical facet arthrosis with unchanged grade 1 anterolisthesis of C4 on C5. Other neck: Similar appearance of left thyroid goiter which posteriorly displaces the proximal left common carotid and left subclavian arteries. Upper chest: Clear lung apices. Review of the MIP images confirms the above findings CTA HEAD FINDINGS Anterior circulation: The internal carotid arteries are patent from skull base to carotid termini with mild nonstenotic siphon atherosclerosis bilaterally. ACAs and MCAs are patent without evidence of proximal branch occlusion or flow limiting proximal stenosis. There may be a scratched of there may be an incidental fenestration of the distal right A1 segment, a normal variant. No aneurysm is identified. Posterior circulation: The intracranial vertebral arteries are patent to the basilar without focal stenosis. The right vertebral artery is particularly hypoplastic distal to the PICA origin. Patent PICA, AICA, and SCA origins are identified bilaterally. The basilar artery is widely patent. There are patent posterior communicating arteries bilaterally, right slightly larger than left. PCAs are patent without evidence of significant stenosis. No aneurysm is identified. Venous sinuses: Patent. Anatomic variants: None of significance. Delayed phase: No abnormal enhancement. Review of the MIP images confirms the above findings IMPRESSION: 1. No evidence of acute intracranial abnormality. 2. Mild-to-moderate chronic small vessel ischemic disease. 3. Mild intracranial and cervical atherosclerosis without major vessel occlusion or significant stenosis. Electronically Signed   By: Allen  Grady M.D.   On: 11/03/2017 17:12   Ct Angio Neck W And/or Wo Contrast  Result Date: 11/03/2017 CLINICAL DATA:  Dizziness, headache, imbalance, and generalized weakness for 1  month. EXAM: CT ANGIOGRAPHY HEAD AND NECK TECHNIQUE: Multidetector CT imaging of the head and neck was performed using the standard protocol during bolus administration of intravenous contrast. Multiplanar CT image reconstructions and MIPs were obtained to evaluate the vascular anatomy. Carotid stenosis measurements (when applicable) are obtained utilizing NASCET criteria, using the distal internal carotid diameter as the denominator. CONTRAST:  80mL ISOVUE-370 IOPAMIDOL (ISOVUE-370) INJECTION 76% COMPARISON:  Neck CTA 04/04/2011.  Brain MRI 01/02/2010. FINDINGS: CT HEAD FINDINGS Brain: There is no evidence of acute infarct, intracranial hemorrhage, mass, midline shift, or extra-axial fluid collection. The ventricles and sulci are normal for age. Patchy cerebral white matter hypodensities are nonspecific but compatible with chronic small vessel ischemic disease, mild-to-moderate for age and similar in distribution to the T2 hyperintensities on the prior MRI. Vascular: Calcified atherosclerosis at the skull base. No hyperdense vessel. Skull: No fracture or focal osseous lesion. Sinuses: Visualized paranasal sinuses and mastoid air cells are clear. Orbits: Unremarkable. Review of the MIP images confirms the above findings CTA NECK FINDINGS Aortic arch: There is a standard 3 vessel aortic arch. Arch vessel origins are widely patent. Right carotid system: Patent without evidence of stenosis or dissection. Mild atherosclerotic plaque in the carotid bulb. Tortuous proximal common carotid   artery and mid cervical ICA. Left carotid system: Patent without evidence of stenosis or dissection. Mild calcified plaque at the carotid bifurcation. Vertebral arteries: Patent without evidence of stenosis or dissection. Strongly dominant left vertebral artery with focal nonstenotic calcified plaque at its origin. Skeleton: Chronically advanced disc degeneration at C6-7 greater than C5-6. Moderate to advanced cervical facet arthrosis  with unchanged grade 1 anterolisthesis of C4 on C5. Other neck: Similar appearance of left thyroid goiter which posteriorly displaces the proximal left common carotid and left subclavian arteries. Upper chest: Clear lung apices. Review of the MIP images confirms the above findings CTA HEAD FINDINGS Anterior circulation: The internal carotid arteries are patent from skull base to carotid termini with mild nonstenotic siphon atherosclerosis bilaterally. ACAs and MCAs are patent without evidence of proximal branch occlusion or flow limiting proximal stenosis. There may be a scratched of there may be an incidental fenestration of the distal right A1 segment, a normal variant. No aneurysm is identified. Posterior circulation: The intracranial vertebral arteries are patent to the basilar without focal stenosis. The right vertebral artery is particularly hypoplastic distal to the PICA origin. Patent PICA, AICA, and SCA origins are identified bilaterally. The basilar artery is widely patent. There are patent posterior communicating arteries bilaterally, right slightly larger than left. PCAs are patent without evidence of significant stenosis. No aneurysm is identified. Venous sinuses: Patent. Anatomic variants: None of significance. Delayed phase: No abnormal enhancement. Review of the MIP images confirms the above findings IMPRESSION: 1. No evidence of acute intracranial abnormality. 2. Mild-to-moderate chronic small vessel ischemic disease. 3. Mild intracranial and cervical atherosclerosis without major vessel occlusion or significant stenosis. Electronically Signed   By: Allen  Grady M.D.   On: 11/03/2017 17:12        Scheduled Meds: . [START ON 11/07/2017] pantoprazole  40 mg Intravenous Q12H  . peg 3350 powder  0.5 kit Oral Q1400   And  . peg 3350 powder  0.5 kit Oral Once  . pravastatin  40 mg Oral q1800  . traZODone  50 mg Oral QHS   Continuous Infusions: . pantoprozole (PROTONIX) infusion 8 mg/hr  (11/04/17 1055)     LOS: 0 days    Time spent: 36 minutes     , MD  [facility] Pager #: [phone] 7PM-7AM contact night coverage as above    

## 2017-11-04 NOTE — Progress Notes (Signed)
Received report from off going RN, agree with previous assessment 

## 2017-11-04 NOTE — H&P (View-Only) (Signed)
Consultation  Referring Provider:  Dr. Marthenia Rolling    Primary Care Physician:  Alain Marion Evie Lacks, MD Primary Gastroenterologist:  Dr. Carlean Purl       Reason for Consultation:  Symptomatic Anemia with rectal bleeding           HPI:   Mindy Taylor is a 84 y.o. female with past medical history significant for multiple myeloma, CVA, HTN, who presented to the ED on 11/03/17 with a complaint of fatigue, shortness of breath with exertion and dizziness for 3 weeks.     Today, explains that for the past month she has had increasing shortness of breath with exertion, dizziness and just "not feeling right".  Patient also described a "hurting in my chest" when she would walk.  Patient tell me it "feels like I am dying".  Describes vague history of multiple myeloma diagnosis and trying some medicine which was "too harsh for my body", as well as a nosebleed and "bleeding behind my eye", not sure how recently.  Also tells me she has not been sleeping for the past month or so and has been pacing her house at night. Patient denies any acute change in bowel habits but has been seeing some bright red blood with stool which she thought was from "thin tissue back there" over the past couple of months when wiping.    Denies weight loss, fever, chills, nausea, vomiting, heartburn, reflux or abdominal pain.  ED Course: hgb 6.4 (7.8 two mos ago, baseline ~10/11), ED noted some gross blood in rectum, pt refused blood transfusion as she is Jehovah's witness  GI History: 04/11/2004 Dr. Carlean Purl colonoscopy: 25 mm sessile polyp in the sigmoid colon, grade 2 external hemorrhoids; pathology: Fragments of tubulovillous adenomatous polyp, no high-grade dysplasia or invasive malignancy  Past Medical History:  Diagnosis Date  . ANXIETY 03/05/2007  . ATAXIA 12/26/2009  . BEE STING 03/05/2007  . BRONCHITIS, ACUTE 06/28/2010  . CANDIDIASIS, VAGINAL 09/08/2008  . CEREBROVASCULAR ACCIDENT, HX OF 01/04/2010  . CERUMEN IMPACTION  06/05/2008  . DIZZINESS 12/26/2009  . Epistaxis 06/05/2008  . Headache(784.0) 06/05/2008  . HYPERLIPIDEMIA 12/08/2006  . HYPERTENSION 12/08/2006  . 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 Multiple myeloma ([place]) 04/03/2015  . NECK PAIN 06/05/2008  . OBESITY 09/07/2009  . ONYCHOMYCOSIS 03/09/2009  . OSTEOARTHRITIS 12/08/2006  . PARESTHESIA 12/26/2009  . TOBACCO USE, QUIT 03/09/2009    Past Surgical History:  Procedure Laterality Date  . ABDOMINAL HYSTERECTOMY    . BREAST SURGERY      Family History  Problem Relation Age of Onset  . Hypertension Mother   . Hypertension Father   . Hypertension Other      Social History   Tobacco Use  . Smoking status: Former Research scientist (life sciences)  . Smokeless tobacco: Never Used  . Tobacco comment: Not sure when; many, many years   Substance Use Topics  . Alcohol use: No    Alcohol/week: 0.0 oz  . Drug use: No    Prior to Admission medications   Medication Sig Start Date End Date Taking? Authorizing Provider  amLODipine (NORVASC) 5 MG tablet TAKE 1 TABLET DAILY 06/30/17  Yes Plotnikov, Evie Lacks, MD  Cholecalciferol (VITAMIN D) 1000 UNITS capsule Take 1 capsule (1,000 Units total) by mouth daily. 01/03/11  Yes Plotnikov, Evie Lacks, MD  fish oil-omega-3 fatty acids 1000 MG capsule Take 1 g by mouth daily.     Yes [provider]  HYDROcodone-acetaminophen (NORCO/VICODIN) 5-325 MG tablet Take 0.5-1 tablets by mouth  every 6 (six) hours as needed for severe pain. 10/12/15  Yes Plotnikov, Evie Lacks, MD  Multiple Vitamin (MULTIVITAMIN) tablet Take 1 tablet by mouth daily.     Yes [provider]  olmesartan (BENICAR) 40 MG tablet Take 1 tablet (40 mg total) by mouth daily. 09/04/17  Yes Plotnikov, Evie Lacks, MD  pravastatin (PRAVACHOL) 40 MG tablet TAKE 1 TABLET DAILY 06/01/17  Yes Plotnikov, Evie Lacks, MD  TOPROL XL 50 MG 24 hr tablet TAKE 1 TABLET TWICE A DAY 08/31/17  Yes Plotnikov, Evie Lacks, MD  vitamin C (ASCORBIC ACID) 500 MG tablet Take 500 mg by mouth daily.      Yes [provider]    Current Facility-Administered Medications  Medication Dose Route Frequency Provider Last Rate Last Dose  . 0.9 %  sodium chloride infusion   Intravenous Continuous Emokpae, Ejiroghene E, MD 75 mL/hr at 11/04/17 0043    . ondansetron (ZOFRAN) tablet 4 mg  4 mg Oral Q6H PRN Emokpae, Ejiroghene E, MD       Or  . ondansetron (ZOFRAN) injection 4 mg  4 mg Intravenous Q6H PRN Emokpae, Ejiroghene E, MD      . pantoprazole (PROTONIX) 80 mg in sodium chloride 0.9 % 250 mL (0.32 mg/mL) infusion  8 mg/hr Intravenous Continuous Emokpae, Ejiroghene E, MD 25 mL/hr at 11/04/17 0126 8 mg/hr at 11/04/17 0126  . [START ON 11/07/2017] pantoprazole (PROTONIX) injection 40 mg  40 mg Intravenous Q12H Emokpae, Ejiroghene E, MD      . pravastatin (PRAVACHOL) tablet 40 mg  40 mg Oral q1800 Emokpae, Ejiroghene E, MD      . traZODone (DESYREL) tablet 50 mg  50 mg Oral QHS Emokpae, Ejiroghene E, MD        Allergies as of 11/03/2017 - Review Complete 11/03/2017  Allergen Reaction Noted  . Bee venom Swelling 09/21/2012  . Ceftin [cefuroxime axetil] Other (See Comments) 07/26/2013  . Other  11/03/2017  . Spider antivenin [black widow spider antivenin (l.mactans)] Swelling and Other (See Comments) 09/21/2012     Review of Systems:    Constitutional: No weight loss, fever or chills Skin: No rash  Cardiovascular: +c/p Respiratory: +DOE Gastrointestinal: See HPI and otherwise negative Genitourinary: No dysuria  Neurological: No headache Musculoskeletal: No new muscle or joint pain Hematologic: No bruising Psychiatric: No history of depression or anxiety   Physical Exam:  Vital signs in last 24 hours: Temp:  [98.3 F (36.8 C)-99.5 F (37.5 C)] 98.5 F (36.9 C) (06/12 0435) Pulse Rate:  [79-95] 95 (06/12 0435) Resp:  [14-23] 20 (06/12 0435) BP: (124-168)/(58-80) 154/79 (06/12 0435) SpO2:  [96 %-100 %] 96 % (06/12 0435) Weight:  [192 lb 12.8 oz (87.5 kg)-195 lb (88.5 kg)] 192  lb 12.8 oz (87.5 kg) (06/11 2337) Last BM Date: 11/02/17 General:   Pleasant AA female appears to be in NAD, Well developed, Well nourished, alert and cooperative Head:  Normocephalic and atraumatic. Eyes:   PEERL, EOMI. No icterus. Conjunctiva pink. Ears:  Normal auditory acuity. Neck:  Supple Throat: Oral cavity and pharynx without inflammation, swelling or lesion.  Lungs: Respirations even and unlabored. Lungs clear to auscultation bilaterally.   No wheezes, crackles, or rhonchi.  Heart: Normal S1, S2. No MRG. Regular rate and rhythm. No peripheral edema, cyanosis or pallor.  Abdomen:  Soft, nondistended, nontender. No rebound or guarding. Normal bowel sounds. No appreciable masses or hepatomegaly. Rectal:  Not performed.  Msk:  Symmetrical without gross deformities. Peripheral pulses intact.  Extremities:  1+ pitting edema b/l legs, Normal ROM, normal sensation. Neurologic:  Alert and  oriented x4;  grossly normal neurologically.  Skin:   Dry and intact without significant lesions or rashes. Psychiatric: Demonstrates good judgement and reason without abnormal affect or behaviors.   LAB RESULTS: Recent Labs    11/03/17 1344 11/03/17 2224  WBC 5.5  --   HGB 6.4* 6.0*  HCT 19.6* 18.9*  PLT 181  --    BMET Recent Labs    11/03/17 1344  NA 136  K 4.3  CL 108  CO2 26  GLUCOSE 104*  BUN 21*  CREATININE 1.23*  CALCIUM 8.8*   LFT Recent Labs    11/03/17 1703  PROT 12.0*  ALBUMIN 2.8*  AST 19  ALT 15  ALKPHOS 45  BILITOT 0.6  BILIDIR <0.1*  IBILI NOT CALCULATED   STUDIES: Ct Angio Head W Or Wo Contrast  Result Date: 11/03/2017 CLINICAL DATA:  Dizziness, headache, imbalance, and generalized weakness for 1 month. EXAM: CT ANGIOGRAPHY HEAD AND NECK TECHNIQUE: Multidetector CT imaging of the head and neck was performed using the standard protocol during bolus administration of intravenous contrast. Multiplanar CT image reconstructions and MIPs were obtained to evaluate  the vascular anatomy. Carotid stenosis measurements (when applicable) are obtained utilizing NASCET criteria, using the distal internal carotid diameter as the denominator. CONTRAST:  11m ISOVUE-370 IOPAMIDOL (ISOVUE-370) INJECTION 76% COMPARISON:  Neck CTA 04/04/2011.  Brain MRI 01/02/2010. FINDINGS: CT HEAD FINDINGS Brain: There is no evidence of acute infarct, intracranial hemorrhage, mass, midline shift, or extra-axial fluid collection. The ventricles and sulci are normal for age. Patchy cerebral white matter hypodensities are nonspecific but compatible with chronic small vessel ischemic disease, mild-to-moderate for age and similar in distribution to the T2 hyperintensities on the prior MRI. Vascular: Calcified atherosclerosis at the skull base. No hyperdense vessel. Skull: No fracture or focal osseous lesion. Sinuses: Visualized paranasal sinuses and mastoid air cells are clear. Orbits: Unremarkable. Review of the MIP images confirms the above findings CTA NECK FINDINGS Aortic arch: There is a standard 3 vessel aortic arch. Arch vessel origins are widely patent. Right carotid system: Patent without evidence of stenosis or dissection. Mild atherosclerotic plaque in the carotid bulb. Tortuous proximal common carotid artery and mid cervical ICA. Left carotid system: Patent without evidence of stenosis or dissection. Mild calcified plaque at the carotid bifurcation. Vertebral arteries: Patent without evidence of stenosis or dissection. Strongly dominant left vertebral artery with focal nonstenotic calcified plaque at its origin. Skeleton: Chronically advanced disc degeneration at C6-7 greater than C5-6. Moderate to advanced cervical facet arthrosis with unchanged grade 1 anterolisthesis of C4 on C5. Other neck: Similar appearance of left thyroid goiter which posteriorly displaces the proximal left common carotid and left subclavian arteries. Upper chest: Clear lung apices. Review of the MIP images confirms the  above findings CTA HEAD FINDINGS Anterior circulation: The internal carotid arteries are patent from skull base to carotid termini with mild nonstenotic siphon atherosclerosis bilaterally. ACAs and MCAs are patent without evidence of proximal branch occlusion or flow limiting proximal stenosis. There may be a scratched of there may be an incidental fenestration of the distal right A1 segment, a normal variant. No aneurysm is identified. Posterior circulation: The intracranial vertebral arteries are patent to the basilar without focal stenosis. The right vertebral artery is particularly hypoplastic distal to the PICA origin. Patent PICA, AICA, and SCA origins are identified bilaterally. The basilar artery is widely patent. There are patent posterior  communicating arteries bilaterally, right slightly larger than left. PCAs are patent without evidence of significant stenosis. No aneurysm is identified. Venous sinuses: Patent. Anatomic variants: None of significance. Delayed phase: No abnormal enhancement. Review of the MIP images confirms the above findings IMPRESSION: 1. No evidence of acute intracranial abnormality. 2. Mild-to-moderate chronic small vessel ischemic disease. 3. Mild intracranial and cervical atherosclerosis without major vessel occlusion or significant stenosis. Electronically Signed   By: Logan Bores M.D.   On: 11/03/2017 17:12   Ct Angio Neck W And/or Wo Contrast  Result Date: 11/03/2017 CLINICAL DATA:  Dizziness, headache, imbalance, and generalized weakness for 1 month. EXAM: CT ANGIOGRAPHY HEAD AND NECK TECHNIQUE: Multidetector CT imaging of the head and neck was performed using the standard protocol during bolus administration of intravenous contrast. Multiplanar CT image reconstructions and MIPs were obtained to evaluate the vascular anatomy. Carotid stenosis measurements (when applicable) are obtained utilizing NASCET criteria, using the distal internal carotid diameter as the  denominator. CONTRAST:  56m ISOVUE-370 IOPAMIDOL (ISOVUE-370) INJECTION 76% COMPARISON:  Neck CTA 04/04/2011.  Brain MRI 01/02/2010. FINDINGS: CT HEAD FINDINGS Brain: There is no evidence of acute infarct, intracranial hemorrhage, mass, midline shift, or extra-axial fluid collection. The ventricles and sulci are normal for age. Patchy cerebral white matter hypodensities are nonspecific but compatible with chronic small vessel ischemic disease, mild-to-moderate for age and similar in distribution to the T2 hyperintensities on the prior MRI. Vascular: Calcified atherosclerosis at the skull base. No hyperdense vessel. Skull: No fracture or focal osseous lesion. Sinuses: Visualized paranasal sinuses and mastoid air cells are clear. Orbits: Unremarkable. Review of the MIP images confirms the above findings CTA NECK FINDINGS Aortic arch: There is a standard 3 vessel aortic arch. Arch vessel origins are widely patent. Right carotid system: Patent without evidence of stenosis or dissection. Mild atherosclerotic plaque in the carotid bulb. Tortuous proximal common carotid artery and mid cervical ICA. Left carotid system: Patent without evidence of stenosis or dissection. Mild calcified plaque at the carotid bifurcation. Vertebral arteries: Patent without evidence of stenosis or dissection. Strongly dominant left vertebral artery with focal nonstenotic calcified plaque at its origin. Skeleton: Chronically advanced disc degeneration at C6-7 greater than C5-6. Moderate to advanced cervical facet arthrosis with unchanged grade 1 anterolisthesis of C4 on C5. Other neck: Similar appearance of left thyroid goiter which posteriorly displaces the proximal left common carotid and left subclavian arteries. Upper chest: Clear lung apices. Review of the MIP images confirms the above findings CTA HEAD FINDINGS Anterior circulation: The internal carotid arteries are patent from skull base to carotid termini with mild nonstenotic siphon  atherosclerosis bilaterally. ACAs and MCAs are patent without evidence of proximal branch occlusion or flow limiting proximal stenosis. There may be a scratched of there may be an incidental fenestration of the distal right A1 segment, a normal variant. No aneurysm is identified. Posterior circulation: The intracranial vertebral arteries are patent to the basilar without focal stenosis. The right vertebral artery is particularly hypoplastic distal to the PICA origin. Patent PICA, AICA, and SCA origins are identified bilaterally. The basilar artery is widely patent. There are patent posterior communicating arteries bilaterally, right slightly larger than left. PCAs are patent without evidence of significant stenosis. No aneurysm is identified. Venous sinuses: Patent. Anatomic variants: None of significance. Delayed phase: No abnormal enhancement. Review of the MIP images confirms the above findings IMPRESSION: 1. No evidence of acute intracranial abnormality. 2. Mild-to-moderate chronic small vessel ischemic disease. 3. Mild intracranial and cervical atherosclerosis without  major vessel occlusion or significant stenosis. Electronically Signed   By: Logan Bores M.D.   On: 11/03/2017 17:12     Impression / Plan:   Impression: 1. Symptomatic Anemia: hgb 6.4--> 6.0 overnight (baseline 10/11), SOB, Dizziness, pt refusing transfusion-Jehovah's Witness, plan for Ferritin and Iron 2. Multiple myeloma  Plan: 1. Continue supportive measures including plans for ferritin and iron 2.  Again discussed blood products with the patient today.  She refused and is aware that without the blood products she may not be able to have any further procedures under sedation and there is a risk of death. 3.  Continue IV Protonix. 4.  Anesthesiology agrees to proceed with Colonoscopy and EGD tomorrow with patient's hgb of 6. Went ahead and discussed risks, benefits, limitations and alternatives and the patient agrees to proceed.  Movi prep ordered and patient to remain on clears, then NPO after midnight. 5.  Please await further recommendations from Dr. Havery Moros later today.  Thank you for your kind consultation, we will continue to follow.  Lavone Nian Jovee Dettinger  11/04/2017, 8:45 AM Pager #: [phone]

## 2017-11-05 ENCOUNTER — Inpatient Hospital Stay (HOSPITAL_COMMUNITY): Payer: Medicare Other

## 2017-11-05 ENCOUNTER — Inpatient Hospital Stay (HOSPITAL_COMMUNITY): Payer: Medicare Other | Admitting: Certified Registered Nurse Anesthetist

## 2017-11-05 ENCOUNTER — Encounter (HOSPITAL_COMMUNITY): Payer: Self-pay

## 2017-11-05 ENCOUNTER — Encounter (HOSPITAL_COMMUNITY)
Admission: EM | Disposition: A | Discharge status: Skilled Nursing Facility | Payer: Self-pay | Source: Home / Self Care | Attending: Internal Medicine

## 2017-11-05 DIAGNOSIS — D128 Benign neoplasm of rectum: Secondary | ICD-10-CM

## 2017-11-05 DIAGNOSIS — D509 Iron deficiency anemia, unspecified: Secondary | ICD-10-CM

## 2017-11-05 DIAGNOSIS — K31819 Angiodysplasia of stomach and duodenum without bleeding: Secondary | ICD-10-CM

## 2017-11-05 DIAGNOSIS — R5383 Other fatigue: Secondary | ICD-10-CM

## 2017-11-05 DIAGNOSIS — K648 Other hemorrhoids: Secondary | ICD-10-CM

## 2017-11-05 HISTORY — PX: ESOPHAGOGASTRODUODENOSCOPY (EGD) WITH PROPOFOL: SHX5813

## 2017-11-05 HISTORY — PX: SUBMUCOSAL INJECTION: SHX5543

## 2017-11-05 HISTORY — PX: COLONOSCOPY WITH PROPOFOL: SHX5780

## 2017-11-05 HISTORY — PX: BIOPSY: SHX5522

## 2017-11-05 LAB — CBC WITH DIFFERENTIAL/PLATELET
Basophils Absolute: 0 10*3/uL (ref 0.0–0.1)
Basophils Relative: 0 %
EOS PCT: 2 %
Eosinophils Absolute: 0.1 10*3/uL (ref 0.0–0.7)
HCT: 17.8 % — ABNORMAL LOW (ref 36.0–46.0)
Hemoglobin: 5.8 g/dL — CL (ref 12.0–15.0)
LYMPHS ABS: 2 10*3/uL (ref 0.7–4.0)
LYMPHS PCT: 42 %
MCH: 33.3 pg (ref 26.0–34.0)
MCHC: 32.6 g/dL (ref 30.0–36.0)
MCV: 102.3 fL — AB (ref 78.0–100.0)
Monocytes Absolute: 0.4 10*3/uL (ref 0.1–1.0)
Monocytes Relative: 8 %
NEUTROS PCT: 48 %
Neutro Abs: 2.3 10*3/uL (ref 1.7–7.7)
PLATELETS: 142 10*3/uL — AB (ref 150–400)
RBC: 1.74 MIL/uL — AB (ref 3.87–5.11)
RDW: 16.5 % — ABNORMAL HIGH (ref 11.5–15.5)
WBC: 4.8 10*3/uL (ref 4.0–10.5)

## 2017-11-05 SURGERY — ESOPHAGOGASTRODUODENOSCOPY (EGD) WITH PROPOFOL
Anesthesia: Monitor Anesthesia Care

## 2017-11-05 MED ORDER — IOPAMIDOL (ISOVUE-300) INJECTION 61%
15.0000 mL | Freq: Two times a day (BID) | INTRAVENOUS | Status: DC | PRN
  Administered 2017-11-05: 30 mL via ORAL
  Filled 2017-11-05: qty 30

## 2017-11-05 MED ORDER — PROPOFOL 10 MG/ML IV BOLUS
INTRAVENOUS | Status: AC
  Filled 2017-11-05: qty 20

## 2017-11-05 MED ORDER — PROPOFOL 10 MG/ML IV BOLUS
INTRAVENOUS | Status: DC | PRN
  Administered 2017-11-05: 40 mg via INTRAVENOUS
  Administered 2017-11-05 (×4): 20 mg via INTRAVENOUS
  Administered 2017-11-05: 40 mg via INTRAVENOUS
  Administered 2017-11-05: 30 mg via INTRAVENOUS
  Administered 2017-11-05: 20 mg via INTRAVENOUS
  Administered 2017-11-05: 30 mg via INTRAVENOUS
  Administered 2017-11-05: 50 mg via INTRAVENOUS
  Administered 2017-11-05: 20 mg via INTRAVENOUS

## 2017-11-05 MED ORDER — LIDOCAINE 2% (20 MG/ML) 5 ML SYRINGE
INTRAMUSCULAR | Status: DC | PRN
  Administered 2017-11-05: 100 mg via INTRAVENOUS

## 2017-11-05 MED ORDER — IOPAMIDOL (ISOVUE-300) INJECTION 61%
INTRAVENOUS | Status: AC
  Administered 2017-11-05: 15 mL
  Filled 2017-11-05: qty 30

## 2017-11-05 MED ORDER — LACTATED RINGERS IV SOLN: INTRAVENOUS | Status: DC | PRN

## 2017-11-05 MED ORDER — EPOETIN ALFA 20000 UNIT/ML IJ SOLN
20000.0000 [IU] | Freq: Every day | INTRAMUSCULAR | Status: DC
  Administered 2017-11-06 – 2017-11-16 (×10): 20000 [IU] via SUBCUTANEOUS
  Filled 2017-11-05 (×12): qty 1

## 2017-11-05 MED ORDER — ESMOLOL HCL 100 MG/10ML IV SOLN
INTRAVENOUS | Status: DC | PRN
  Administered 2017-11-05 (×4): 10 mg via INTRAVENOUS

## 2017-11-05 MED ORDER — EPOETIN ALFA 40000 UNIT/ML IJ SOLN: 40000.0000 [IU] | Freq: Every day | INTRAMUSCULAR | Status: DC

## 2017-11-05 MED ORDER — SPOT INK MARKER SYRINGE KIT
PACK | SUBMUCOSAL | Status: DC | PRN
  Administered 2017-11-05: 2 mL via SUBMUCOSAL

## 2017-11-05 MED ORDER — SODIUM CHLORIDE 0.9 % IV SOLN
INTRAVENOUS | Status: DC | PRN
  Administered 2017-11-05 (×2): via INTRAVENOUS

## 2017-11-05 MED ORDER — SPOT INK MARKER SYRINGE KIT
PACK | SUBMUCOSAL | Status: AC
  Filled 2017-11-05: qty 5

## 2017-11-05 MED ORDER — SODIUM CHLORIDE 0.9 % IV SOLN
510.0000 mg | Freq: Once | INTRAVENOUS | Status: AC
  Administered 2017-11-05: 510 mg via INTRAVENOUS
  Filled 2017-11-05: qty 17

## 2017-11-05 MED ORDER — IOPAMIDOL (ISOVUE-300) INJECTION 61%
INTRAVENOUS | Status: AC
  Filled 2017-11-05: qty 100

## 2017-11-05 MED ORDER — HYDRALAZINE HCL 20 MG/ML IJ SOLN: 10.0000 mg | Freq: Three times a day (TID) | INTRAMUSCULAR | Status: DC | PRN

## 2017-11-05 MED ORDER — IOPAMIDOL (ISOVUE-300) INJECTION 61%
100.0000 mL | Freq: Once | INTRAVENOUS | Status: AC | PRN
  Administered 2017-11-05: 80 mL via INTRAVENOUS

## 2017-11-05 SURGICAL SUPPLY — 25 items

## 2017-11-05 NOTE — Interval H&P Note (Signed)
History and Physical Interval Note:  11/05/2017 11:00 AM  Mindy Taylor  has presented today for surgery, with the diagnosis of Anemia, Hematochezia  The various methods of treatment have been discussed with the patient and family. After consideration of risks, benefits and other options for treatment, the patient has consented to  Procedure(s): ESOPHAGOGASTRODUODENOSCOPY (EGD) WITH PROPOFOL (N/A) COLONOSCOPY WITH PROPOFOL (N/A) as a surgical intervention .  The patient's history has been reviewed, patient examined, no change in status, stable for surgery.  I have reviewed the patient's chart and labs.  Questions were answered to the patient's satisfaction.     [place]

## 2017-11-05 NOTE — Progress Notes (Signed)
CRITICAL VALUE ALERT  Critical Value:  HGB 5.8  Date & Time Notied:  11/05/17 , 1720  Provider Notified: Dr Horris Latino  Orders Received/Actions taken: YES

## 2017-11-05 NOTE — Op Note (Signed)
[facility] Patient Name: Mindy Taylor Procedure Date: 11/05/2017 MRN: [id number] Attending MD: Carlota Raspberry. Havery Moros , MD Date of Birth: September 15, 1928 CSN: [id number] Age: 84 Admit Type: Outpatient Procedure:                Upper GI endoscopy Indications:              acute on chronic anemia Providers:                Carlota Raspberry. Havery Moros, MD, Cleda Daub, RN,                            William Dalton, Technician Referring MD:              Medicines:                Monitored Anesthesia Care Complications:            No immediate complications. Estimated blood loss:                            Minimal. Estimated Blood Loss:     Estimated blood loss was minimal. Procedure:                Pre-Anesthesia Assessment:                           - Prior to the procedure, a History and Physical                            was performed, and patient medications and                            allergies were reviewed. The patient's tolerance of                            previous anesthesia was also reviewed. The risks                            and benefits of the procedure and the sedation                            options and risks were discussed with the patient.                            All questions were answered, and informed consent                            was obtained. Prior Anticoagulants: The patient has                            taken no previous anticoagulant or antiplatelet                            agents. ASA Grade Assessment: III - A patient with  severe systemic disease. After reviewing the risks                            and benefits, the patient was deemed in                            satisfactory condition to undergo the procedure.                           After obtaining informed consent, the endoscope was                            passed under direct vision. Throughout the                            procedure, the patient's  blood pressure, pulse, and                            oxygen saturations were monitored continuously. The                            [id number] ([id number]) scope was introduced through the                            mouth, and advanced to the second part of duodenum.                            The upper GI endoscopy was accomplished without                            difficulty. The patient tolerated the procedure                            well. Scope In: Scope Out: Findings:      Esophagogastric landmarks were identified: the Z-line was found at 38       cm, the gastroesophageal junction was found at 38 cm and the upper       extent of the gastric folds was found at 38 cm from the incisors.      The exam of the esophagus was otherwise normal.      A single diminutive no bleeding angiodysplastic lesion was found in the       gastric body. Fulguration to ablate the lesion to prevent bleeding by       argon plasma was successful.      The exam of the stomach was otherwise normal.      The duodenal bulb and second portion of the duodenum were normal. Impression:               - Esophagogastric landmarks identified.                           - Normal esophagus                           - A single non-bleeding angiodysplastic lesion in  the stomach. Treated with argon plasma coagulation                            (APC).                           - Normal stomach otherwise                           - Normal duodenal bulb and second portion of the                            duodenum. Moderate Sedation:      No moderate sedation, case performed with MAC Recommendation:           - Return patient to hospital ward for ongoing care.                           - Resume previous diet.                           - Continue present medications.                           - Recommendations per colonoscopy note Procedure Code(s):        --- Professional ---                            [phone], Esophagogastroduodenoscopy, flexible,                            transoral; with control of bleeding, any method Diagnosis Code(s):        --- Professional ---                           U04.540, Angiodysplasia of stomach and duodenum                            without bleeding                           D50.0, Iron deficiency anemia secondary to blood                            loss (chronic) CPT copyright 2017 American Medical Association. All rights reserved. The codes documented in this report are preliminary and upon coder review may  be revised to meet current compliance requirements. Remo Lipps P. Chantell Kunkler, MD 11/05/2017 12:07:42 PM This report has been signed electronically. Number of Addenda: 0

## 2017-11-05 NOTE — Anesthesia Preprocedure Evaluation (Signed)
Anesthesia Evaluation  Patient identified by MRN, date of birth, ID band Patient awake    Reviewed: Allergy & Precautions, NPO status , Patient's Chart, lab work & pertinent test results  Airway Mallampati: II  TM Distance: >3 FB Neck ROM: Full    Dental   Pulmonary former smoker,    breath sounds clear to auscultation       Cardiovascular hypertension, Pt. on medications + Peripheral Vascular Disease   Rhythm:Regular Rate:Normal     Neuro/Psych  Headaches, Anxiety CVA    GI/Hepatic negative GI ROS, Neg liver ROS,   Endo/Other  negative endocrine ROS  Renal/GU negative Renal ROS     Musculoskeletal  (+) Arthritis ,   Abdominal   Peds  Hematology  (+) anemia ,   Anesthesia Other Findings   Reproductive/Obstetrics                             Anesthesia Physical Anesthesia Plan  ASA: IV  Anesthesia Plan: MAC   Post-op Pain Management:    Induction:   PONV Risk Score and Plan: 2 and Propofol infusion and Treatment may vary due to age or medical condition  Airway Management Planned: Natural Airway and Nasal Cannula  Additional Equipment:   Intra-op Plan:   Post-operative Plan:   Informed Consent: I have reviewed the patients History and Physical, chart, labs and discussed the procedure including the risks, benefits and alternatives for the proposed anesthesia with the patient or authorized representative who has indicated his/her understanding and acceptance.     Plan Discussed with:   Anesthesia Plan Comments:         Anesthesia Quick Evaluation

## 2017-11-05 NOTE — Op Note (Signed)
[facility] Patient Name: Mindy Taylor Procedure Date: 11/05/2017 MRN: [id number] Attending MD: Carlota Raspberry. Havery Moros , MD Date of Birth: March 11, 1929 CSN: [id number] Age: 84 Admit Type: Outpatient Procedure:                Colonoscopy Indications:              Hematochezia, acute on chronic anemia Providers:                Carlota Raspberry. Havery Moros, MD, Cleda Daub, RN,                            William Dalton, Technician Referring MD:              Medicines:                Monitored Anesthesia Care Complications:            No immediate complications. Estimated blood loss:                            Minimal. Estimated Blood Loss:     Estimated blood loss was minimal. Procedure:                Pre-Anesthesia Assessment:                           - Prior to the procedure, a History and Physical                            was performed, and patient medications and                            allergies were reviewed. The patient's tolerance of                            previous anesthesia was also reviewed. The risks                            and benefits of the procedure and the sedation                            options and risks were discussed with the patient.                            All questions were answered, and informed consent                            was obtained. Prior Anticoagulants: The patient has                            taken no previous anticoagulant or antiplatelet                            agents. ASA Grade Assessment: III - A patient with  severe systemic disease. After reviewing the risks                            and benefits, the patient was deemed in                            satisfactory condition to undergo the procedure.                           After obtaining informed consent, the colonoscope                            was passed under direct vision. Throughout the                            procedure, the  patient's blood pressure, pulse, and                            oxygen saturations were monitored continuously. The                            EC-[id number] ([id number]) scope was introduced through                            the anus and advanced to the the cecum, identified                            by appendiceal orifice and ileocecal valve. The                            colonoscopy was performed without difficulty. The                            patient tolerated the procedure well. The quality                            of the bowel preparation was adequate. The                            ileocecal valve, appendiceal orifice, and rectum                            were photographed. Scope In: 11:28:30 AM Scope Out: 11:47:55 AM Scope Withdrawal Time: 0 hours 13 minutes 32 seconds  Total Procedure Duration: 0 hours 19 minutes 25 seconds  Findings:      The perianal and digital rectal examinations were normal.      A frond-like/villous large polypoid mass was found in the recto-sigmoid       colon (roughly 15-18cm from the anal verge with scope straight). The       mass was partially circumferential (involving one-half of the lumen       circumference) and had a broad base. Biopsies were taken with a cold       forceps for histology. Area just distal to the mass was tattooed with an  injection of Spot (carbon black).      Internal hemorrhoids were found during retroflexion.      A few diminutive benign appearing polyps noted but not removed to       minimize risk for further blood loss. The exam was otherwise without       abnormality. Impression:               - Polypoid mass in the recto-sigmoid colon.                            Biopsied. Tattooed.                           - Internal hemorrhoids.                           - The examination was otherwise normal.                           I suspect this may likely represent a malignancy,                            versus large /  bulky broad based polyp, and is the                            likely cause for symptoms and worsening anemia. Moderate Sedation:      No moderate sedation, case performed with MAC Recommendation:           - Return patient to hospital ward for ongoing care.                           - Resume previous diet.                           - Continue present medications.                           - Await pathology results.                           - Will discuss options with patient. She is                            Jehovah's witness and declining blood products,                            without which she would not be a candidate for                            therapy. If this is a large benign polyp, risk for                            bleeding would be high with endoscopic resection.                            Consider  CT abdomen / pelvis with contrast for                            staging if renal function allows and patient wishes                            to have this done Procedure Code(s):        --- Professional ---                           [phone], Colonoscopy, flexible; with directed                            submucosal injection(s), any substance                           45380, Colonoscopy, flexible; with biopsy, single                            or multiple Diagnosis Code(s):        --- Professional ---                           D49.0, Neoplasm of unspecified behavior of                            digestive system                           K64.8, Other hemorrhoids                           K92.1, Melena (includes Hematochezia)                           D50.0, Iron deficiency anemia secondary to blood                            loss (chronic) CPT copyright 2017 American Medical Association. All rights reserved. The codes documented in this report are preliminary and upon coder review may  be revised to meet current compliance requirements. Remo Lipps P. Armbruster, MD 11/05/2017  11:59:12 AM This report has been signed electronically. Number of Addenda: 0

## 2017-11-05 NOTE — Progress Notes (Signed)
PROGRESS NOTE  Mindy Taylor YQM:[id number] DOB: April 29, 1929 DOA: 11/03/2017 PCP: Cassandria Anger, MD  HPI/Recap of past 24 hours: Mindy Taylor is a 84 y.o. female with medical history significant for multiple myeloma, CVA, HTN, who presented to the ED with complaints of fatigue, shortness of breath with exertion, and dizziness, of 3 weeks duration.  Also reports left lower extremity swelling over the past 3 weeks.  Patient denies chest pain, no cough, no fever or chills.  She reports 3-4 times weekly use of 2 tablets ibuprofen.  Reports stool Color is mostly unchanged, denies blood in stools, denies abdominal pain. In the ED, Hemoglobin low at 6.4, down from 7.82 months ago, with baseline~ 10 and 11. EDP-notes some blood in rectum, notes a small amount of gross blood in stool in rectum, GI was consulted in the ED. Patient refused blood transfusion in the ED-she is a Jehovah's Witness. Pt admitted for further management  Today, pt denies any new complaints, awaiting EGD/colonoscopy this am.     Assessment/Plan: Active Problems:   Essential hypertension   Cerebral artery occlusion with cerebral infarction ([place])   Multiple myeloma ([place])   Anemia  Symptomatic anemia likely due to GI bleed/mass in sigmoid colon No evidence of active bleeding  Baseline Hemoglobin is 10-11g/dl, now 6.6 Patient is Jehovah witness, and refuses blood transfusion GI on board: Rec EGD, colonoscopy Colonoscopy showed polypoid mass in the recto-sigmoid colon likely a malignancy Vs large/bulky broad based polyp EGD showed a single non-bleeding angiodysplastic lesion in the stomach treated with APC GI rec CT abdomen/pelvis to further evaluate mass Hematology consulted for none blood transfusion therapy, s/sp feraheme, epogen/procrit Monitor closely, minimal blood draws   Multiple myeloma Patient previously followed with Dr.Enerva,patient declined treatment since 2016 and is not ready to continue  medications Oncology on board  HTN Held home norvasc, benicar PRN hydralazine      Code Status: DNR  Family Communication: None at bedside   Disposition Plan: Once work up complete    Consultants:  GI  Oncology   Procedures:  EGD/Colonoscopy on 11/05/17  Antimicrobials:  NOne  DVT prophylaxis:  SCDs   Objective: Vitals:   11/05/17 1200 11/05/17 1210 11/05/17 1220 11/05/17 1421  BP: (!) 150/78 (!) 156/90 (!) 165/86 119/72  Pulse: (!) 101 (!) 102 100 (!) 109  Resp: (!) _0 Temp:    98.4 F (36.9 C)  TempSrc:    Oral  SpO2: 100% 100% 100% 100%  Weight:      Height:        Intake/Output Summary (Last 24 hours) at 11/05/2017 1528 Last data filed at 11/05/2017 1154 Gross per 24 hour  Intake 700 ml  Output 0 ml  Net 700 ml   Filed Weights   11/03/17 1247 11/03/17 2337 11/05/17 1020  Weight: 88.5 kg (195 lb) 87.5 kg (192 lb 12.8 oz) 87.1 kg (192 lb)    Exam:   General:  NAD, pallor  Cardiovascular: S1, S2 present  Respiratory: CTAB   Abdomen: Soft, NT, ND, BS present   Musculoskeletal: +1 pitting edema BLE  Skin: Normal  Psychiatry: Normal mood    Data Reviewed: CBC: Recent Labs  Lab 11/03/17 1344 11/03/17 2224 11/04/17 0849 11/04/17 2027  WBC 5.5  --  4.3 5.6  HGB 6.4* 6.0* 6.2* 6.6*  HCT 19.6* 18.9* 19.2* 20.5*  MCV 102.6*  --  102.7* 101.5*  PLT 181  --  172 889   Basic Metabolic  Panel: Recent Labs  Lab 11/03/17 1344  NA 136  K 4.3  CL 108  CO2 26  GLUCOSE 104*  BUN 21*  CREATININE 1.23*  CALCIUM 8.8*   GFR: Estimated Creatinine Clearance: 36.5 mL/min (A) (by C-G formula based on SCr of 1.23 mg/dL (H)). Liver Function Tests: Recent Labs  Lab 11/03/17 1703  AST 19  ALT 15  ALKPHOS 45  BILITOT 0.6  PROT 12.0*  ALBUMIN 2.8*   No results for input(s): LIPASE, AMYLASE in the last 168 hours. No results for input(s): AMMONIA in the last 168 hours. Coagulation Profile: No results for input(s): INR,  PROTIME in the last 168 hours. Cardiac Enzymes: No results for input(s): CKTOTAL, CKMB, CKMBINDEX, TROPONINI in the last 168 hours. BNP (last 3 results) No results for input(s): PROBNP in the last 8760 hours. HbA1C: No results for input(s): HGBA1C in the last 72 hours. CBG: Recent Labs  Lab 11/03/17 1258  GLUCAP 85   Lipid Profile: No results for input(s): CHOL, HDL, LDLCALC, TRIG, CHOLHDL, LDLDIRECT in the last 72 hours. Thyroid Function Tests: No results for input(s): TSH, T4TOTAL, FREET4, T3FREE, THYROIDAB in the last 72 hours. Anemia Panel: Recent Labs    11/04/17 0355  VITAMINB12 575  FOLATE 24.1  FERRITIN 17  TIBC 261  IRON 47  RETICCTPCT 1.5   Urine analysis:    Component Value Date/Time   COLORURINE YELLOW 11/03/2017 1344   APPEARANCEUR CLEAR 11/03/2017 1344   LABSPEC 1.013 11/03/2017 1344   PHURINE 5.0 11/03/2017 1344   GLUCOSEU NEGATIVE 11/03/2017 1344   GLUCOSEU NEGATIVE 09/06/2012 0900   HGBUR LARGE (A) 11/03/2017 1344   BILIRUBINUR NEGATIVE 11/03/2017 1344   KETONESUR NEGATIVE 11/03/2017 1344   PROTEINUR NEGATIVE 11/03/2017 1344   UROBILINOGEN 0.2 03/14/2015 1300   NITRITE NEGATIVE 11/03/2017 1344   LEUKOCYTESUR NEGATIVE 11/03/2017 1344   Sepsis Labs: _0 (procalcitonin:4,lacticidven:4)  )No results found for this or any previous visit (from the past 240 hour(s)).    Studies: No results found.  Scheduled Meds: . epoetin alfa  20,000 Units Subcutaneous Daily  . [START ON 11/07/2017] pantoprazole  40 mg Intravenous Q12H  . peg 3350 powder  0.5 kit Oral Q1400  . pravastatin  40 mg Oral q1800  . traZODone  50 mg Oral QHS    Continuous Infusions: . pantoprozole (PROTONIX) infusion 8 mg/hr (11/05/17 0200)     LOS: 1 day     Alma Friendly, MD [facility]   If 7PM-7AM, please contact night-coverage [email or web address] Password [facility] 11/05/2017, 3:28 PM

## 2017-11-05 NOTE — Progress Notes (Signed)
It was nice to see Mindy Taylor again.  He has been about 2 years since I last saw her.  I very much appreciate Dr. Hazeline Junker help yesterday.  She has myeloma.  She tried treatment but had a very hard time with it.  She has actually done pretty well.  I have to suspect that her myeloma is now over taking her bone marrow.  I know that she is going for a colonoscopy and upper endoscopy today.  Her stool is heme positive.  She is a Restaurant manager, fast food.  She will not take any blood transfusion.  I told her respect this.  We could easily try to get her blood better by given her Procrit.  I think this would be reasonable.  If she is iron deficient.  We will go ahead and give her a dose of IV iron.  She is had no obvious melena or bright red blood per rectum.  She just feels very tired.  Her labs yesterday showed a white count of 5.6.  Hemoglobin 6.6.  Platelet count 183,000.  Her corrected reticulocyte count was less than 1%.  We are looking at quality life as our goal.  She does not want any myeloma therapy.  I totally understand this.  I think we can improve her anemia with IV iron and with some Procrit.  Being a Sales promotion account executive Witness, this is very important for her again she will not accept any type of blood product.  Her examination is relatively unremarkable.  We will help to follow along.  She says that if she goes to heaven, she is okay with this.  Lattie Haw, MD

## 2017-11-05 NOTE — Anesthesia Procedure Notes (Signed)
Procedure Name: MAC Date/Time: 11/05/2017 11:09 AM Performed by: Deliah Boston, CRNA Pre-anesthesia Checklist: Patient identified, Emergency Drugs available, Suction available, Patient being monitored and Timeout performed Patient Re-evaluated:Patient Re-evaluated prior to induction Oxygen Delivery Method: Nasal cannula Placement Confirmation: positive ETCO2,  CO2 detector and breath sounds checked- equal and bilateral

## 2017-11-05 NOTE — Anesthesia Postprocedure Evaluation (Signed)
Anesthesia Post Note  Patient: Mindy Taylor  Procedure(s) Performed: ESOPHAGOGASTRODUODENOSCOPY (EGD) WITH PROPOFOL (N/A ) COLONOSCOPY WITH PROPOFOL (N/A ) BIOPSY     Patient location during evaluation: PACU Anesthesia Type: MAC Level of consciousness: awake and alert Pain management: pain level controlled Vital Signs Assessment: post-procedure vital signs reviewed and stable Respiratory status: spontaneous breathing, nonlabored ventilation, respiratory function stable and patient connected to nasal cannula oxygen Cardiovascular status: stable and blood pressure returned to baseline Postop Assessment: no apparent nausea or vomiting Anesthetic complications: no    Last Vitals:  Vitals:   11/05/17 1210 11/05/17 1220  BP: (!) 156/90 (!) 165/86  Pulse: (!) 102 100  Resp: 20 20  Temp:    SpO2: 100% 100%    Last Pain:  Vitals:   11/05/17 1157  TempSrc: Oral  PainSc:                  Tiajuana Amass

## 2017-11-05 NOTE — Transfer of Care (Signed)
Immediate Anesthesia Transfer of Care Note  Patient: Mindy Taylor  Procedure(s) Performed: Procedure(s) with comments: ESOPHAGOGASTRODUODENOSCOPY (EGD) WITH PROPOFOL (N/A) - APC COLONOSCOPY WITH PROPOFOL (N/A) BIOPSY SCHLEROTHERAPY OF VARICES - spot tattoo  Patient Location: PACU  Anesthesia Type:MAC  Level of Consciousness: Patient easily awoken, sedated, comfortable, cooperative, following commands, responds to stimulation.   Airway & Oxygen Therapy: Patient spontaneously breathing, ventilating well, oxygen via simple oxygen mask.  Post-op Assessment: Report given to PACU RN, vital signs reviewed and stable, moving all extremities.   Post vital signs: Reviewed and stable.  Complications: No apparent anesthesia complications Last Vitals:  Vitals Value Taken Time  BP 144/70 11/05/2017 11:57 AM  Temp    Pulse    Resp 17 11/05/2017 11:58 AM  SpO2    Vitals shown include unvalidated device data.  Last Pain:  Vitals:   11/05/17 1020  TempSrc: Oral  PainSc: 0-No pain         Complications: No apparent anesthesia complications

## 2017-11-06 ENCOUNTER — Encounter (HOSPITAL_COMMUNITY): Payer: Self-pay | Admitting: Gastroenterology

## 2017-11-06 DIAGNOSIS — K6389 Other specified diseases of intestine: Secondary | ICD-10-CM

## 2017-11-06 DIAGNOSIS — R1900 Intra-abdominal and pelvic swelling, mass and lump, unspecified site: Secondary | ICD-10-CM

## 2017-11-06 LAB — BASIC METABOLIC PANEL
Anion gap: 1 — ABNORMAL LOW (ref 5–15)
BUN: 11 mg/dL (ref 6–20)
CALCIUM: 8.8 mg/dL — AB (ref 8.9–10.3)
CHLORIDE: 109 mmol/L (ref 101–111)
CO2: 25 mmol/L (ref 22–32)
CREATININE: 1.03 mg/dL — AB (ref 0.44–1.00)
GFR calc Af Amer: 55 mL/min — ABNORMAL LOW (ref >60)
GFR calc non Af Amer: 47 mL/min — ABNORMAL LOW (ref >60)
Glucose, Bld: 102 mg/dL — ABNORMAL HIGH (ref 65–99)
Potassium: 3.3 mmol/L — ABNORMAL LOW (ref 3.5–5.1)
SODIUM: 135 mmol/L (ref 135–145)

## 2017-11-06 LAB — CBC WITH DIFFERENTIAL/PLATELET
Basophils Absolute: 0 10*3/uL (ref 0.0–0.1)
Basophils Relative: 0 %
EOS ABS: 0.1 10*3/uL (ref 0.0–0.7)
Eosinophils Relative: 2 %
HCT: 18.5 % — ABNORMAL LOW (ref 36.0–46.0)
HEMOGLOBIN: 5.9 g/dL — AB (ref 12.0–15.0)
LYMPHS ABS: 2.1 10*3/uL (ref 0.7–4.0)
Lymphocytes Relative: 36 %
MCH: 32.6 pg (ref 26.0–34.0)
MCHC: 31.9 g/dL (ref 30.0–36.0)
MCV: 102.2 fL — ABNORMAL HIGH (ref 78.0–100.0)
Monocytes Absolute: 0.5 10*3/uL (ref 0.1–1.0)
Monocytes Relative: 8 %
NEUTROS PCT: 54 %
Neutro Abs: 3.2 10*3/uL (ref 1.7–7.7)
Platelets: 157 10*3/uL (ref 150–400)
RBC: 1.81 MIL/uL — ABNORMAL LOW (ref 3.87–5.11)
RDW: 16.8 % — AB (ref 11.5–15.5)
WBC: 5.9 10*3/uL (ref 4.0–10.5)

## 2017-11-06 LAB — KAPPA/LAMBDA LIGHT CHAINS
KAPPA, LAMDA LIGHT CHAIN RATIO: 0.77 (ref 0.26–1.65)
Kappa free light chain: 57.5 mg/L — ABNORMAL HIGH (ref 3.3–19.4)
LAMDA FREE LIGHT CHAINS: 74.2 mg/L — AB (ref 5.7–26.3)

## 2017-11-06 LAB — IGG, IGA, IGM
IGA: 11 mg/dL — AB (ref 64–422)
IGG (IMMUNOGLOBIN G), SERUM: 8409 mg/dL — AB (ref 700–1600)
IgM (Immunoglobulin M), Srm: 17 mg/dL — ABNORMAL LOW (ref 26–217)

## 2017-11-06 MED ORDER — POTASSIUM CHLORIDE CRYS ER 20 MEQ PO TBCR
40.0000 meq | EXTENDED_RELEASE_TABLET | Freq: Once | ORAL | Status: AC
  Administered 2017-11-06: 40 meq via ORAL
  Filled 2017-11-06: qty 2

## 2017-11-06 NOTE — Progress Notes (Signed)
24h urine started.  Barbee Shropshire. Brigitte Pulse, RN

## 2017-11-06 NOTE — Progress Notes (Signed)
CRITICAL VALUE ALERT  Critical Value:  hgb 5.9  Date & Time Notied:  11/06/17 0530  Provider Notified: Silas Sacramento, NP  Orders Received/Actions taken: pending

## 2017-11-06 NOTE — Care Management Note (Signed)
Case Management Note  Patient Details  Name: Mindy Taylor MRN: [id number] Date of Birth: 09/18/1928  Subjective/Objective:    Multiple Myeloma, pt declined treatment since 2016. Hgb 6.4, 6.2.  On admission            Action/Plan: Pt is a Jehovah's witness. She will not take blood. There are no HH needs at present.    Expected Discharge Date:  (unknown)               Expected Discharge Plan:  Home/Self Care  In-House Referral:     Discharge planning Services  CM Consult  Post Acute Care Choice:    Choice offered to:     DME Arranged:    DME Agency:     HH Arranged:    HH Agency:     Status of Service:  In process, will continue to follow  If discussed at Long Length of Stay Meetings, dates discussed:    Additional CommentsPurcell Mouton, RN 11/06/2017, 10:50 AM

## 2017-11-06 NOTE — Progress Notes (Addendum)
[facility] Progress Note  CC:  Anemia and colon mass  Subjective:  Hgb is 5.9 grams this AM.  Mass in rectosigmoid colon seen on colonoscopy 6/13, biopsies taken and shows a TVA with HGD.  CT scan abdomen and pelvis with contrast also 6/13 as follows:  IMPRESSION: 1. Biliary sludge in the gallbladder. No findings to strongly suggest an acute cholecystitis are noted at this time. However, there could be some very mild intrahepatic biliary ductal dilatation. If there is any clinical concern for biliary tract obstruction, further evaluation with MRI of the abdomen with and without IV gadolinium with MRCP could be considered. 2. Aortic atherosclerosis, in addition to at least 2 vessel coronary artery disease. 3. There are calcifications of the aortic valve and mitral annulus. Echocardiographic correlation for evaluation of potential valvular dysfunction may be warranted if clinically indicated. 4. Additional incidental findings, as above.  Says that overall she actually feels well.  No BM since colonoscopy.  Eating well.  Objective:  Vital signs in last 24 hours: Temp:  [98.1 F (36.7 C)-99.2 F (37.3 C)] 98.1 F (36.7 C) (06/14 0520) Pulse Rate:  [88-109] 95 (06/14 0520) Resp:  [16-22] 18 (06/14 0520) BP: (112-165)/(58-90) 131/70 (06/14 0520) SpO2:  [95 %-100 %] 98 % (06/14 0520) Weight:  [192 lb (87.1 kg)] 192 lb (87.1 kg) (06/13 1020) Last BM Date: 11/05/17 General:  Alert, Well-developed, in NAD Heart:  Regular rate and rhythm; no murmurs Pulm:  CTAB.  No increased WOB. Abdomen:  Soft, non-distended.  BS present.  Non-tender. Extremities:  Trace edema in B/L LE's. Neurologic:  Alert and oriented x 4;  grossly normal neurologically. Psych:  Alert and cooperative. Normal mood and affect.  Intake/Output from previous day: 06/13 0701 - 06/14 0700 In: 1100 [I.V.:1100] Out: 1 [Urine:1]  Lab Results: Recent Labs    11/04/17 2027 11/05/17 1651  11/06/17 0416  WBC 5.6 4.8 5.9  HGB 6.6* 5.8* 5.9*  HCT 20.5* 17.8* 18.5*  PLT 183 142* 157   BMET Recent Labs    11/03/17 1344 11/06/17 0416  NA 136 135  K 4.3 3.3*  CL 108 109  CO2 26 25  GLUCOSE 104* 102*  BUN 21* 11  CREATININE 1.23* 1.03*  CALCIUM 8.8* 8.8*   LFT Recent Labs    11/03/17 1703  PROT 12.0*  ALBUMIN 2.8*  AST 19  ALT 15  ALKPHOS 45  BILITOT 0.6  BILIDIR <0.1*  IBILI NOT CALCULATED   Ct Abdomen Pelvis W Contrast  Result Date: 11/06/2017 CLINICAL DATA:  84 year old female with history of multiple myeloma. Three week history of fatigue and shortness of breath with exertion, with some dizziness. EXAM: CT ABDOMEN AND PELVIS WITH CONTRAST TECHNIQUE: Multidetector CT imaging of the abdomen and pelvis was performed using the standard protocol following bolus administration of intravenous contrast. CONTRAST:  73m ISOVUE-300 IOPAMIDOL (ISOVUE-300) INJECTION 61%, 339mISOVUE-300 IOPAMIDOL (ISOVUE-300) INJECTION 61% COMPARISON:  None. FINDINGS: Lower chest: Mild cardiomegaly. Atherosclerotic calcifications in the left anterior descending and right coronary arteries. Calcifications of the aortic valve and mitral annulus. Hepatobiliary: Several well-defined low-attenuation lesions in the liver, largest of which measures 2.9 cm in segment 7. This largest lesion is compatible with a simple cyst. The smaller lesions are too small to characterize, but are likely to represent tiny cysts or biliary hamartomas. No suspicious hepatic lesions are noted. There may be very mild intrahepatic biliary ductal dilatation. Common bile duct measures 7 mm in the porta hepatis (within normal  limits for the patient's age). Amorphous high attenuation material lying dependently in the gallbladder likely reflects biliary sludge. Gallbladder does not appear distended. No pericholecystic fluid or surrounding inflammatory changes. Pancreas: No pancreatic mass. No pancreatic ductal dilatation. No  pancreatic or peripancreatic fluid or inflammatory changes. Spleen: Unremarkable. Adrenals/Urinary Tract: Subcentimeter low-attenuation lesion in the interpolar region of the right kidney is too small to characterize, but is statistically likely a tiny cyst. Left kidney and bilateral adrenal glands are normal in appearance. There is no hydroureteronephrosis. Urinary bladder is normal in appearance. Stomach/Bowel: Normal appearance of the stomach. No pathologic dilatation of small bowel or colon. The appendix is not confidently identified and may be surgically absent. Regardless, there are no inflammatory changes noted adjacent to the cecum to suggest the presence of an acute appendicitis at this time. Vascular/Lymphatic: Aortic atherosclerosis, without evidence of aneurysm or dissection in the abdominal or pelvic vasculature. No lymphadenopathy noted in the abdomen or pelvis. Reproductive: Status post hysterectomy. Ovaries are not confidently identified may be surgically absent or atrophic. Other: No significant volume of ascites.  No pneumoperitoneum. Musculoskeletal: There are no aggressive appearing lytic or blastic lesions noted in the visualized portions of the skeleton. IMPRESSION: 1. Biliary sludge in the gallbladder. No findings to strongly suggest an acute cholecystitis are noted at this time. However, there could be some very mild intrahepatic biliary ductal dilatation. If there is any clinical concern for biliary tract obstruction, further evaluation with MRI of the abdomen with and without IV gadolinium with MRCP could be considered. 2. Aortic atherosclerosis, in addition to at least 2 vessel coronary artery disease. 3. There are calcifications of the aortic valve and mitral annulus. Echocardiographic correlation for evaluation of potential valvular dysfunction may be warranted if clinically indicated. 4. Additional incidental findings, as above. Aortic Atherosclerosis (ICD10-I70.0). Electronically  Signed   By: Vinnie Langton M.D.   On: 11/06/2017 08:12   Assessment / Plan: 1. Symptomatic Anemia: Hgb down to 5.9 grams this AM.  Pt refusing transfusion-Jehovah's Witness.  IV iron and procrit.  This anemia is likely multifactorial. 2. Colon mass:  Seen on colonoscopy 6/13 in rectosigmoid colon.  Biopsies obtained and showed TVA with HGD. 3. Multiple myeloma:  Per heme/onc.  -Monitor Hgb but try to minimize blood draws. -Dr. Havery Moros to have discussion with patient later today.   LOS: 2 days   Laban Emperor. Laterra Lubinski  11/06/2017, 9:41 AM

## 2017-11-06 NOTE — Care Management Important Message (Signed)
Important Message  Patient Details  Name: Mindy Taylor MRN: [id number] Date of Birth: 12-09-1928   Medicare Important Message Given:  Yes    Kerin Salen 11/06/2017, 11:40 AMImportant Message  Patient Details  Name: Mindy Taylor MRN: [id number] Date of Birth: April 26, 1929   Medicare Important Message Given:  Yes    Kerin Salen 11/06/2017, 11:40 AM

## 2017-11-06 NOTE — Progress Notes (Signed)
Ms. Bumgarner had her upper and lower endoscopy.  Surprisingly, there is a mass in the colon.  This was biopsied.  It is possible this might represent malignancy.  It also likely represents the where she bled.  She got iron yesterday.  We started her on Procrit.  Her IgG level is over 8000.  I also think this is a huge problem.  I would had to suspect that her M spike is going to be over 6.  As such, we may have 2 reasons for her to be so anemic.  She may have colonic bleeding and iron deficiency.  She also may have marrow involvement with decreased production of red blood cells.  I talked to her today about the possibility that myeloma is her problem.  I told her that I would only consider a bone marrow biopsy on her if she would agree to do any therapy.  If she does not want therapy for the myeloma, then I will see where a bone marrow biopsy is necessary.  I told her that we did have newer treatments that we could utilize to try to help.  She has had no problems with nausea or vomiting.  She has had no fever.  She is had no obvious melena or bright red blood per rectum.  I think the biopsy of this colonic mass is going to be critical.  We will have to see what her light chains look like.  We will see what her M spike is.  Lattie Haw, MD  Hebrew[place] 12:12

## 2017-11-06 NOTE — Progress Notes (Signed)
PROGRESS NOTE  Marquel Spoto Danish OEU:[id number] DOB: 1929/04/06 DOA: 11/03/2017 PCP: Cassandria Anger, MD  HPI/Recap of past 24 hours: Mindy Taylor is a 84 y.o. female with medical history significant for multiple myeloma, CVA, HTN, who presented to the ED with complaints of fatigue, shortness of breath with exertion, and dizziness, of 3 weeks duration.  Also reports left lower extremity swelling over the past 3 weeks.  Patient denies chest pain, no cough, no fever or chills.  She reports 3-4 times weekly use of 2 tablets ibuprofen.  Reports stool Color is mostly unchanged, denies blood in stools, denies abdominal pain. In the ED, Hemoglobin low at 6.4, down from 7.82 months ago, with baseline~ 10 and 11. EDP-notes some blood in rectum, notes a small amount of gross blood in stool in rectum, GI was consulted in the ED. Patient refused blood transfusion in the ED-she is a Jehov[employer]'s Witness. Pt admitted for further management  Today, pt denies any new complaints, hgb noted to be dropping    Assessment/Plan: Active Problems:   Essential hypertension   Cerebral artery occlusion with cerebral infarction (HCC)   Multiple myeloma (HCC)   Anemia  Symptomatic anemia likely due to GI bleed/mass in sigmoid colon No evidence of active bleeding  Baseline Hemoglobin is 10-11g/dl, now 5.9 Patient is Jehovah witness, and refuses blood transfusion GI on board: Rec EGD, colonoscopy Colonoscopy showed polypoid mass in the recto-sigmoid colon likely a malignancy Vs large/bulky broad based polyp, pathology report shows Tubular villous adenoma with high grade dysplasia EGD showed a single non-bleeding angiodysplastic lesion in the stomach treated with APC GI rec CT abdomen/pelvis to further evaluate mass which didn't show mass in colon Hematology consulted for none blood transfusion therapy, s/sp feraheme, epogen/procrit Monitor closely, minimal blood draws   Multiple myeloma Patient previously  followed with Dr.Enerva,patient declined treatment since 2016 and is not ready to continue medications Oncology on board  HTN Held home norvasc, benicar PRN hydralazine      Code Status: DNR  Family Communication: None at bedside   Disposition Plan: Once work up complete    Consultants:  GI  Oncology   Procedures:  EGD/Colonoscopy on 11/05/17  Antimicrobials:  None  DVT prophylaxis:  SCDs   Objective: Vitals:   11/05/17 1421 11/05/17 2001 11/06/17 0520 11/06/17 1303  BP: 119/72 (!) 112/58 131/70 125/76  Pulse: (!) 109 98 95 (!) 104  Resp: '16 20 18 18  '$ Temp: 98.4 F (36.9 C) 99.2 F (37.3 C) 98.1 F (36.7 C) 99.3 F (37.4 C)  TempSrc: Oral Oral Oral Oral  SpO2: 100% 98% 98% 99%  Weight:      Height:        Intake/Output Summary (Last 24 hours) at 11/06/2017 1805 Last data filed at 11/06/2017 1534 Gross per 24 hour  Intake 879.17 ml  Output 351 ml  Net 528.17 ml   Filed Weights   11/03/17 1247 11/03/17 2337 11/05/17 1020  Weight: 88.5 kg (195 lb) 87.5 kg (192 lb 12.8 oz) 87.1 kg (192 lb)    Exam:   General:  NAD, pallor  Cardiovascular: S1, S2 present  Respiratory: CTAB   Abdomen: Soft, NT, ND, BS present   Musculoskeletal: +1 pitting edema BLE  Skin: Normal  Psychiatry: Normal mood    Data Reviewed: CBC: Recent Labs  Lab 11/03/17 1344 11/03/17 2224 11/04/17 0849 11/04/17 2027 11/05/17 1651 11/06/17 0416  WBC 5.5  --  4.3 5.6 4.8 5.9  NEUTROABS  --   --   --   --  2.3 3.2  HGB 6.4* 6.0* 6.2* 6.6* 5.8* 5.9*  HCT 19.6* 18.9* 19.2* 20.5* 17.8* 18.5*  MCV 102.6*  --  102.7* 101.5* 102.3* 102.2*  PLT 181  --  172 183 142* 812   Basic Metabolic Panel: Recent Labs  Lab 11/03/17 1344 11/06/17 0416  NA 136 135  K 4.3 3.3*  CL 108 109  CO2 26 25  GLUCOSE 104* 102*  BUN 21* 11  CREATININE 1.23* 1.03*  CALCIUM 8.8* 8.8*   GFR: Estimated Creatinine Clearance: 43.6 mL/min (A) (by C-G formula based on SCr of 1.03 mg/dL  (H)). Liver Function Tests: Recent Labs  Lab 11/03/17 1703  AST 19  ALT 15  ALKPHOS 45  BILITOT 0.6  PROT 12.0*  ALBUMIN 2.8*   No results for input(s): LIPASE, AMYLASE in the last 168 hours. No results for input(s): AMMONIA in the last 168 hours. Coagulation Profile: No results for input(s): INR, PROTIME in the last 168 hours. Cardiac Enzymes: No results for input(s): CKTOTAL, CKMB, CKMBINDEX, TROPONINI in the last 168 hours. BNP (last 3 results) No results for input(s): PROBNP in the last 8760 hours. HbA1C: No results for input(s): HGBA1C in the last 72 hours. CBG: Recent Labs  Lab 11/03/17 1258  GLUCAP 85   Lipid Profile: No results for input(s): CHOL, HDL, LDLCALC, TRIG, CHOLHDL, LDLDIRECT in the last 72 hours. Thyroid Function Tests: No results for input(s): TSH, T4TOTAL, FREET4, T3FREE, THYROIDAB in the last 72 hours. Anemia Panel: Recent Labs    11/04/17 0355  VITAMINB12 575  FOLATE 24.1  FERRITIN 17  TIBC 261  IRON 47  RETICCTPCT 1.5   Urine analysis:    Component Value Date/Time   COLORURINE YELLOW 11/03/2017 1344   APPEARANCEUR CLEAR 11/03/2017 1344   LABSPEC 1.013 11/03/2017 1344   PHURINE 5.0 11/03/2017 1344   GLUCOSEU NEGATIVE 11/03/2017 1344   GLUCOSEU NEGATIVE 09/06/2012 0900   HGBUR LARGE (A) 11/03/2017 1344   BILIRUBINUR NEGATIVE 11/03/2017 1344   KETONESUR NEGATIVE 11/03/2017 1344   PROTEINUR NEGATIVE 11/03/2017 1344   UROBILINOGEN 0.2 03/14/2015 1300   NITRITE NEGATIVE 11/03/2017 1344   LEUKOCYTESUR NEGATIVE 11/03/2017 1344   Sepsis Labs: '@LABRCNTIP'$ (procalcitonin:4,lacticidven:4)  )No results found for this or any previous visit (from the past 240 hour(s)).    Studies: No results found.  Scheduled Meds: . epoetin alfa  20,000 Units Subcutaneous Daily  . [START ON 11/07/2017] pantoprazole  40 mg Intravenous Q12H  . pravastatin  40 mg Oral q1800  . traZODone  50 mg Oral QHS    Continuous Infusions: . pantoprozole (PROTONIX)  infusion 8 mg/hr (11/06/17 0734)     LOS: 2 days     Alma Friendly, MD [facility]   If 7PM-7AM, please contact night-coverage [email or web address] Password [facility] 11/06/2017, 6:05 PM

## 2017-11-07 ENCOUNTER — Inpatient Hospital Stay (HOSPITAL_COMMUNITY): Payer: Medicare Other

## 2017-11-07 MED ORDER — TRAMADOL HCL 50 MG PO TABS
50.0000 mg | ORAL_TABLET | Freq: Two times a day (BID) | ORAL | Status: DC | PRN
  Administered 2017-11-09 – 2017-11-11 (×3): 50 mg via ORAL
  Filled 2017-11-07 (×4): qty 1

## 2017-11-07 NOTE — Progress Notes (Cosign Needed Addendum)
Pt returned from x-ray. Gave her some mouth rinse and moisturizer  Continues to be upbeat and enthusiastic with no new complaints.

## 2017-11-07 NOTE — Progress Notes (Cosign Needed Addendum)
Pt complain of abdominal pain, checked by DR who x-ray scheduled.  Finished 24 hour urine.  Pt stated weakness has been generally better only one fir of weakness recently.  Brought patient  to x-ray  Waiting on consult from pallative care DR. Otherwise it was an uneventful day.

## 2017-11-07 NOTE — Progress Notes (Signed)
PROGRESS NOTE  Mindy Taylor TMH:[id number] DOB: August 07, 1928 DOA: 11/03/2017 PCP: Cassandria Anger, MD  HPI/Recap of past 24 hours: Mindy Taylor is a 84 y.o. female with medical history significant for multiple myeloma, CVA, HTN, who presented to the ED with complaints of fatigue, shortness of breath with exertion, and dizziness, of 3 weeks duration.  Also reports left lower extremity swelling over the past 3 weeks.  Patient denies chest pain, no cough, no fever or chills.  She reports 3-4 times weekly use of 2 tablets ibuprofen.  Reports stool Color is mostly unchanged, denies blood in stools, denies abdominal pain. In the ED, Hemoglobin low at 6.4, down from 7.82 months ago, with baseline~ 10 and 11. EDP-notes some blood in rectum, notes a small amount of gross blood in stool in rectum, GI was consulted in the ED. Patient refused blood transfusion in the ED-she is a Jehovah's Witness. Mindy Taylor admitted for further management  Today, Mindy Taylor reports some mild generalized abdominal pain with some nausea. Noted to have some loose stools. Remains afebrile. Cautious with blood draws since she is Jehovah's Witness and refusing transfusions.   Assessment/Plan: Active Problems:   Essential hypertension   Cerebral artery occlusion with cerebral infarction (HCC)   Multiple myeloma (HCC)   Anemia   Colonic mass  Symptomatic anemia likely due to GI bleed/mass in sigmoid colon No evidence of active bleeding  Baseline Hemoglobin is 10-11g/dl, last hgb 5.9 Patient is Jehovah witness, and refuses blood transfusion GI on board: Rec EGD, colonoscopy Colonoscopy showed polypoid mass in the recto-sigmoid colon likely a malignancy Vs large/bulky broad based polyp, pathology report shows Tubular villous adenoma with high grade dysplasia (awaiting oncology input). GI cannot do a resection/polypectomy without transfusion EGD showed a single non-bleeding angiodysplastic lesion in the stomach treated with APC GI rec  CT abdomen/pelvis to further evaluate mass which didn't show mass in colon Hematology consulted for none blood transfusion therapy, s/sp feraheme, epogen/procrit Monitor closely, minimal blood draws  Multiple myeloma Patient previously followed with Dr.Enerva,patient declined treatment since 2016 and is not ready to continue medications Oncology on board  HTN BP stable Held home norvasc, benicar PRN hydralazine      Code Status: DNR  Family Communication: Family at bedside   Disposition Plan: Once work up complete    Consultants:  GI  Oncology   Procedures:  EGD/Colonoscopy on 11/05/17  Antimicrobials:  None  DVT prophylaxis:  SCDs   Objective: Vitals:   11/06/17 0520 11/06/17 1303 11/06/17 2102 11/07/17 1350  BP: 131/70 125/76 126/63 122/65  Pulse: 95 (!) 104 96 98  Resp: '18 18 17 16  '$ Temp: 98.1 F (36.7 C) 99.3 F (37.4 C) 99.7 F (37.6 C) 99.2 F (37.3 C)  TempSrc: Oral Oral Oral Oral  SpO2: 98% 99% 100% 97%  Weight:      Height:        Intake/Output Summary (Last 24 hours) at 11/07/2017 1359 Last data filed at 11/07/2017 9211 Gross per 24 hour  Intake 570.43 ml  Output 1250 ml  Net -679.57 ml   Filed Weights   11/03/17 1247 11/03/17 2337 11/05/17 1020  Weight: 88.5 kg (195 lb) 87.5 kg (192 lb 12.8 oz) 87.1 kg (192 lb)    Exam:   General:  NAD, pallor  Cardiovascular: S1, S2 present  Respiratory: CTAB   Abdomen: Soft, NT, ND, BS present   Musculoskeletal: Trace pitting edema BLE  Skin: Normal  Psychiatry: Normal mood    Data  Reviewed: CBC: Recent Labs  Lab 11/03/17 1344 11/03/17 2224 11/04/17 0849 11/04/17 2027 11/05/17 1651 11/06/17 0416  WBC 5.5  --  4.3 5.6 4.8 5.9  NEUTROABS  --   --   --   --  2.3 3.2  HGB 6.4* 6.0* 6.2* 6.6* 5.8* 5.9*  HCT 19.6* 18.9* 19.2* 20.5* 17.8* 18.5*  MCV 102.6*  --  102.7* 101.5* 102.3* 102.2*  PLT 181  --  172 183 142* 356   Basic Metabolic Panel: Recent Labs  Lab  11/03/17 1344 11/06/17 0416  NA 136 135  K 4.3 3.3*  CL 108 109  CO2 26 25  GLUCOSE 104* 102*  BUN 21* 11  CREATININE 1.23* 1.03*  CALCIUM 8.8* 8.8*   GFR: Estimated Creatinine Clearance: 43.6 mL/min (A) (by C-G formula based on SCr of 1.03 mg/dL (H)). Liver Function Tests: Recent Labs  Lab 11/03/17 1703  AST 19  ALT 15  ALKPHOS 45  BILITOT 0.6  PROT 12.0*  ALBUMIN 2.8*   No results for input(s): LIPASE, AMYLASE in the last 168 hours. No results for input(s): AMMONIA in the last 168 hours. Coagulation Profile: No results for input(s): INR, PROTIME in the last 168 hours. Cardiac Enzymes: No results for input(s): CKTOTAL, CKMB, CKMBINDEX, TROPONINI in the last 168 hours. BNP (last 3 results) No results for input(s): PROBNP in the last 8760 hours. HbA1C: No results for input(s): HGBA1C in the last 72 hours. CBG: Recent Labs  Lab 11/03/17 1258  GLUCAP 85   Lipid Profile: No results for input(s): CHOL, HDL, LDLCALC, TRIG, CHOLHDL, LDLDIRECT in the last 72 hours. Thyroid Function Tests: No results for input(s): TSH, T4TOTAL, FREET4, T3FREE, THYROIDAB in the last 72 hours. Anemia Panel: No results for input(s): VITAMINB12, FOLATE, FERRITIN, TIBC, IRON, RETICCTPCT in the last 72 hours. Urine analysis:    Component Value Date/Time   COLORURINE YELLOW 11/03/2017 1344   APPEARANCEUR CLEAR 11/03/2017 1344   LABSPEC 1.013 11/03/2017 1344   PHURINE 5.0 11/03/2017 1344   GLUCOSEU NEGATIVE 11/03/2017 1344   GLUCOSEU NEGATIVE 09/06/2012 0900   HGBUR LARGE (A) 11/03/2017 1344   BILIRUBINUR NEGATIVE 11/03/2017 1344   KETONESUR NEGATIVE 11/03/2017 1344   PROTEINUR NEGATIVE 11/03/2017 1344   UROBILINOGEN 0.2 03/14/2015 1300   NITRITE NEGATIVE 11/03/2017 1344   LEUKOCYTESUR NEGATIVE 11/03/2017 1344   Sepsis Labs: '@LABRCNTIP'$ (procalcitonin:4,lacticidven:4)  )No results found for this or any previous visit (from the past 240 hour(s)).    Studies: No results  found.  Scheduled Meds: . epoetin alfa  20,000 Units Subcutaneous Daily  . pantoprazole  40 mg Intravenous Q12H  . pravastatin  40 mg Oral q1800  . traZODone  50 mg Oral QHS    Continuous Infusions:    LOS: 3 days     Alma Friendly, MD [facility]   If 7PM-7AM, please contact night-coverage [email or web address] Password [facility] 11/07/2017, 1:59 PM

## 2017-11-08 DIAGNOSIS — Z515 Encounter for palliative care: Secondary | ICD-10-CM

## 2017-11-08 LAB — PROTEIN ELECTROPHORESIS, SERUM
A/G RATIO SPE: 0.4 — AB (ref 0.7–1.7)
ALBUMIN ELP: 3.5 g/dL (ref 2.9–4.4)
ALPHA-1-GLOBULIN: 0.3 g/dL (ref 0.0–0.4)
Alpha-2-Globulin: 0.9 g/dL (ref 0.4–1.0)
BETA GLOBULIN: 1.1 g/dL (ref 0.7–1.3)
GAMMA GLOBULIN: 5.5 g/dL — AB (ref 0.4–1.8)
Globulin, Total: 7.8 g/dL — ABNORMAL HIGH (ref 2.2–3.9)
M-Spike, %: 5.3 g/dL — ABNORMAL HIGH
TOTAL PROTEIN ELP: 11.3 g/dL — AB (ref 6.0–8.5)

## 2017-11-08 MED ORDER — PANTOPRAZOLE SODIUM 40 MG PO TBEC
40.0000 mg | DELAYED_RELEASE_TABLET | Freq: Two times a day (BID) | ORAL | Status: DC
  Administered 2017-11-08 – 2017-11-16 (×16): 40 mg via ORAL
  Filled 2017-11-08 (×17): qty 1

## 2017-11-08 NOTE — Progress Notes (Signed)
PROGRESS NOTE  Mindy Taylor OIZ:[id number] DOB: 11/18/1928 DOA: 11/03/2017 PCP: Cassandria Anger, MD  HPI/Recap of past 24 hours: Mindy Taylor is a 84 y.o. female with medical history significant for multiple myeloma, CVA, HTN, who presented to the ED with complaints of fatigue, shortness of breath with exertion, and dizziness, of 3 weeks duration.  Also reports left lower extremity swelling over the past 3 weeks.  Patient denies chest pain, no cough, no fever or chills.  She reports 3-4 times weekly use of 2 tablets ibuprofen.  Reports stool Color is mostly unchanged, denies blood in stools, denies abdominal pain. In the ED, Hemoglobin low at 6.4, down from 7.82 months ago, with baseline~ 10 and 11. EDP-notes some blood in rectum, notes a small amount of gross blood in stool in rectum, GI was consulted in the ED. Patient refused blood transfusion in the ED-she is a Jehovah's Witness. Pt admitted for further management  Today, pt denies any new complaints. Cautious with blood draws since she is Jehovah's Witness and refusing transfusions.   Assessment/Plan: Active Problems:   Essential hypertension   Cerebral artery occlusion with cerebral infarction (HCC)   Multiple myeloma (HCC)   Anemia   Colonic mass  Symptomatic anemia likely due to GI bleed/mass in sigmoid colon No evidence of active bleeding  Baseline Hemoglobin is 10-11g/dl, last hgb 5.9 Patient is Jehovah witness, and refuses blood transfusion GI on board: Rec EGD, colonoscopy Colonoscopy showed polypoid mass in the recto-sigmoid colon likely a malignancy Vs large/bulky broad based polyp, pathology report shows Tubular villous adenoma with high grade dysplasia (awaiting oncology input). GI cannot do a resection/polypectomy without transfusion EGD showed a single non-bleeding angiodysplastic lesion in the stomach treated with APC GI rec CT abdomen/pelvis to further evaluate mass which didn't show mass in colon Hematology  consulted for none blood transfusion therapy, s/sp feraheme, epogen/procrit Monitor closely, minimal blood draws  Multiple myeloma Patient previously followed with Dr.Enerva,patient declined treatment since 2016 and is not ready to continue medications Oncology on board  HTN BP stable Held home norvasc, benicar PRN hydralazine      Code Status: DNR  Family Communication: None at bedside   Disposition Plan: Once work up complete, likely residential/home hospice    Consultants:  GI  Oncology   Procedures:  EGD/Colonoscopy on 11/05/17  Antimicrobials:  None  DVT prophylaxis:  SCDs   Objective: Vitals:   11/07/17 2147 11/08/17 0242 11/08/17 0346 11/08/17 1320  BP: 136/69  (!) 109/56 126/72  Pulse: 97  87 100  Resp: _0 Temp: 99 F (37.2 C) 98.6 F (37 C) 98.9 F (37.2 C) 98.8 F (37.1 C)  TempSrc: Oral Oral Oral Oral  SpO2: 100%  99% 98%  Weight:      Height:       No intake or output data in the 24 hours ending 11/08/17 1532 Filed Weights   11/03/17 1247 11/03/17 2337 11/05/17 1020  Weight: 88.5 kg (195 lb) 87.5 kg (192 lb 12.8 oz) 87.1 kg (192 lb)    Exam:   General:  NAD, pallor  Cardiovascular: S1, S2 present  Respiratory: CTAB   Abdomen: Soft, NT, ND, BS present   Musculoskeletal: No pedal edema b/l  Skin: Normal  Psychiatry: Normal mood    Data Reviewed: CBC: Recent Labs  Lab 11/03/17 1344 11/03/17 2224 11/04/17 0849 11/04/17 2027 11/05/17 1651 11/06/17 0416  WBC 5.5  --  4.3 5.6 4.8 5.9  NEUTROABS  --   --   --   --  2.3 3.2  HGB 6.4* 6.0* 6.2* 6.6* 5.8* 5.9*  HCT 19.6* 18.9* 19.2* 20.5* 17.8* 18.5*  MCV 102.6*  --  102.7* 101.5* 102.3* 102.2*  PLT 181  --  172 183 142* 269   Basic Metabolic Panel: Recent Labs  Lab 11/03/17 1344 11/06/17 0416  NA 136 135  K 4.3 3.3*  CL 108 109  CO2 26 25  GLUCOSE 104* 102*  BUN 21* 11  CREATININE 1.23* 1.03*  CALCIUM 8.8* 8.8*   GFR: Estimated Creatinine  Clearance: 43.6 mL/min (A) (by C-G formula based on SCr of 1.03 mg/dL (H)). Liver Function Tests: Recent Labs  Lab 11/03/17 1703  AST 19  ALT 15  ALKPHOS 45  BILITOT 0.6  PROT 12.0*  ALBUMIN 2.8*   No results for input(s): LIPASE, AMYLASE in the last 168 hours. No results for input(s): AMMONIA in the last 168 hours. Coagulation Profile: No results for input(s): INR, PROTIME in the last 168 hours. Cardiac Enzymes: No results for input(s): CKTOTAL, CKMB, CKMBINDEX, TROPONINI in the last 168 hours. BNP (last 3 results) No results for input(s): PROBNP in the last 8760 hours. HbA1C: No results for input(s): HGBA1C in the last 72 hours. CBG: Recent Labs  Lab 11/03/17 1258  GLUCAP 85   Lipid Profile: No results for input(s): CHOL, HDL, LDLCALC, TRIG, CHOLHDL, LDLDIRECT in the last 72 hours. Thyroid Function Tests: No results for input(s): TSH, T4TOTAL, FREET4, T3FREE, THYROIDAB in the last 72 hours. Anemia Panel: No results for input(s): VITAMINB12, FOLATE, FERRITIN, TIBC, IRON, RETICCTPCT in the last 72 hours. Urine analysis:    Component Value Date/Time   COLORURINE YELLOW 11/03/2017 1344   APPEARANCEUR CLEAR 11/03/2017 1344   LABSPEC 1.013 11/03/2017 1344   PHURINE 5.0 11/03/2017 1344   GLUCOSEU NEGATIVE 11/03/2017 1344   GLUCOSEU NEGATIVE 09/06/2012 0900   HGBUR LARGE (A) 11/03/2017 1344   BILIRUBINUR NEGATIVE 11/03/2017 1344   KETONESUR NEGATIVE 11/03/2017 1344   PROTEINUR NEGATIVE 11/03/2017 1344   UROBILINOGEN 0.2 03/14/2015 1300   NITRITE NEGATIVE 11/03/2017 1344   LEUKOCYTESUR NEGATIVE 11/03/2017 1344   Sepsis Labs: _0 (procalcitonin:4,lacticidven:4)  )No results found for this or any previous visit (from the past 240 hour(s)).    Studies: No results found.  Scheduled Meds: . epoetin alfa  20,000 Units Subcutaneous Daily  . pantoprazole  40 mg Intravenous Q12H  . pravastatin  40 mg Oral q1800  . traZODone  50 mg Oral QHS    Continuous  Infusions:    LOS: 4 days     Alma Friendly, MD [facility]   If 7PM-7AM, please contact night-coverage [email or web address] Password [facility] 11/08/2017, 3:32 PM

## 2017-11-08 NOTE — Consult Note (Signed)
Consultation Note Date: 11/08/2017   Patient Name: Mindy Taylor  DOB: 07-13-1928  MRN: [id number]  Age / Sex: 84 y.o., female  PCP: Plotnikov, Evie Lacks, MD Referring Physician: Alma Friendly, MD  Reason for Consultation: Establishing goals of care  HPI/Patient Profile: 84 y.o. female admitted on 11/03/2017    Clinical Assessment and Goals of Care:  84 yo with severe anemia, Jehovah's Witness, noted to have a large rectosigmoid polypoid mass lesion on colonoscopy with biopsy showing tubulovillous adenoma - high-grade dysplasia, high risk for adenocarcinoma. Also with concern for multiple myeloma. She has been followed by medical oncology as well as gastroenterology colleagues in this hospitalization. She remains admitted on hospital medicine service. She has critically low hemoglobin. Palliative consultation for goals of care discussions.  Patient is awake and alert sitting up in bed. She is attempting to feed herself breakfast. Her niece healthcare power of attorney agent her Suzann Lazaro, Jazzlynn's husband who both live in [place], [place] are now present at the bedside. I introduced myself and palliative care as follows: Palliative medicine is specialized medical care for people living with serious illness. It focuses on providing relief from the symptoms and stress of a serious illness. The goal is to improve quality of life for both the patient and the family.  Goals wishes and values attempted to be elicited. Patient frankly states that she knows she is up against a lot of things, she knows she has serious life limiting illnesses including cancer. She is aware of her multiple myeloma. She is aware of the possibility of colon cancer.  Pain and non-pain symptom management options discussed. Patient did have some mild nausea and vomiting 1 overnight. She denies any pain currently. Patient states  she is at peace with her dying, states she has lived a satisfactory life. I introduced hospice philosophy of care. Patient stated that she was well aware. She has had a chance to visit several of her friends at the hospice home in [facility] in years past. She is aware of the care that is provider over there.  At present, patient lives in an apartment in [place]. We discussed about home with hospice versus residential hospice at the time of discharge.  Patient and family asking whether or not there is wall for radiation treatments here at least as a palliative measure to give the patient some degree of meaningful life prolongation. We will request primary team to formally consult radiation oncology. Medical oncology Dr. Marin Olp also following.  Continue current mode of care. Niece given contact information about how to get in touch with our palliative service here at [facility]. All of their questions answered to the best of my ability. We will continue to follow along, help guide appropriate decision-making and help guide appropriate disposition planning.  HCPOA  niece Mirely Pangle [phone] is the designated [employer], she lives in [place], [place].   DISCUSSIONS/SUMMARY OF RECOMMENDATIONS    1. DNR DNI 2. Patient is a Sales promotion account executive witness, we will  respect her wishes for no PRBC transfusions.  3. Consider Rad onc consult, Dr Ennever following.  4. Patient doesn't want any surgeries, she is aware of hospice philosophy of care, she has visited her friends/family members at the hospice [place]. Continue residential hospice at time of discharge discussions.   Code Status/Advance Care Planning:  DNR    Symptom Management:    continue current mode of care.   Palliative Prophylaxis:   Bowel Regimen   Psycho-social/Spiritual:   Desire for further Chaplaincy support:yes  Additional Recommendations: Education on Hospice  Prognosis:   Guarded.   Discharge Planning:  discussions ongoing about residential hospice at time of discharge.      Primary Diagnoses: Present on Admission: . Multiple myeloma (HCC) . Essential hypertension . Cerebral artery occlusion with cerebral infarction (HCC)   I have reviewed the medical record, interviewed the patient and family, and examined the patient. The following aspects are pertinent.  Past Medical History:  Diagnosis Date  . ANXIETY 03/05/2007  . ATAXIA 12/26/2009  . BEE STING 03/05/2007  . BRONCHITIS, ACUTE 06/28/2010  . CANDIDIASIS, VAGINAL 09/08/2008  . CEREBROVASCULAR ACCIDENT, HX OF 01/04/2010  . CERUMEN IMPACTION 06/05/2008  . DIZZINESS 12/26/2009  . Epistaxis 06/05/2008  . Headache(784.0) 06/05/2008  . HYPERLIPIDEMIA 12/08/2006  . HYPERTENSION 12/08/2006  . 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 Multiple myeloma (HCC) 04/03/2015  . NECK PAIN 06/05/2008  . OBESITY 09/07/2009  . ONYCHOMYCOSIS 03/09/2009  . OSTEOARTHRITIS 12/08/2006  . PARESTHESIA 12/26/2009  . TOBACCO USE, QUIT 03/09/2009   Social History   Socioeconomic History  . Marital status: Widowed    Spouse name: Not on file  . Number of children: Not on file  . Years of education: Not on file  . Highest education level: Not on file  Occupational History  . Not on file  Social Needs  . Financial resource strain: Not on file  . Food insecurity:    Worry: Not on file    Inability: Not on file  . Transportation needs:    Medical: Not on file    Non-medical: Not on file  Tobacco Use  . Smoking status: Former Smoker  . Smokeless tobacco: Never Used  . Tobacco comment: Not sure when; many, many years   Substance and Sexual Activity  . Alcohol use: No    Alcohol/week: 0.0 oz  . Drug use: No  . Sexual activity: Never  Lifestyle  . Physical activity:    Days per week: Not on file    Minutes per session: Not on file  . Stress: Not on file  Relationships  . Social connections:    Talks on phone: Not on file    Gets together: Not on file     Attends religious service: Not on file    Active member of club or organization: Not on file    Attends meetings of clubs or organizations: Not on file    Relationship status: Not on file  Other Topics Concern  . Not on file  Social History Narrative  . Not on file   Family History  Problem Relation Age of Onset  . Hypertension Mother   . Hypertension Father   . Hypertension Other    Scheduled Meds: . epoetin alfa  20,000 Units Subcutaneous Daily  . pantoprazole  40 mg Intravenous Q12H  . pravastatin  40 mg Oral q1800  . traZODone  50 mg Oral QHS   Continuous Infusions: PRN Meds:.hydrALAZINE, iopamidol, ondansetron **OR** ondansetron (  ZOFRAN) IV, traMADol Medications Prior to Admission:  Prior to Admission medications   Medication Sig Start Date End Date Taking? Authorizing Provider  amLODipine (NORVASC) 5 MG tablet TAKE 1 TABLET DAILY 06/30/17  Yes Plotnikov, Aleksei V, MD  Cholecalciferol (VITAMIN D) 1000 UNITS capsule Take 1 capsule (1,000 Units total) by mouth daily. 01/03/11  Yes Plotnikov, Aleksei V, MD  fish oil-omega-3 fatty acids 1000 MG capsule Take 1 g by mouth daily.     Yes [provider]  HYDROcodone-acetaminophen (NORCO/VICODIN) 5-325 MG tablet Take 0.5-1 tablets by mouth every 6 (six) hours as needed for severe pain. 10/12/15  Yes Plotnikov, Aleksei V, MD  Multiple Vitamin (MULTIVITAMIN) tablet Take 1 tablet by mouth daily.     Yes [provider]  olmesartan (BENICAR) 40 MG tablet Take 1 tablet (40 mg total) by mouth daily. 09/04/17  Yes Plotnikov, Aleksei V, MD  pravastatin (PRAVACHOL) 40 MG tablet TAKE 1 TABLET DAILY 06/01/17  Yes Plotnikov, Aleksei V, MD  TOPROL XL 50 MG 24 hr tablet TAKE 1 TABLET TWICE A DAY 08/31/17  Yes Plotnikov, Aleksei V, MD  vitamin C (ASCORBIC ACID) 500 MG tablet Take 500 mg by mouth daily.     Yes [provider]   Allergies  Allergen Reactions  . Bee Venom Swelling    Swelling at site of sting  . Ceftin  [Cefuroxime Axetil] Other (See Comments)    Made the patient "feel weird"  . Other     PT is a Jehovah Witness and wants no blood products.  . Spider Antivenin [Black Widow Spider Antivenin (L.Mactans)] Swelling and Other (See Comments)    Pain and swelling at site of bite   Review of Systems +nausea/vomiting overnight  Physical Exam Awake alert In no distress S 1 S2 Clear Abdomen soft, some mild generalized tenderness Trace edema Non focal  Vital Signs: BP (!) 109/56 (BP Location: Right Arm)   Pulse 87   Temp 98.9 F (37.2 C) (Oral)   Resp 18   Ht 5' 7" (1.702 m)   Wt 87.1 kg (192 lb)   SpO2 99%   BMI 30.07 kg/m  Pain Scale: 0-10   Pain Score: 0-No pain   SpO2: SpO2: 99 % O2 Device:SpO2: 99 % O2 Flow Rate: .O2 Flow Rate (L/min): 2 L/min  IO: Intake/output summary: No intake or output data in the 24 hours ending 11/08/17 1059  LBM: Last BM Date: 11/07/17 Baseline Weight: Weight: 88.5 kg (195 lb) Most recent weight: Weight: 87.1 kg (192 lb)     Palliative Assessment/Data:   PPS 40%  Time In:  9 Time Out:10.10   Time Total:  70 min  Greater than 50%  of this time was spent counseling and coordinating care related to the above assessment and plan.  Signed by:  , MD [phone]   Please contact Palliative Medicine Team phone at [phone] for questions and concerns.  For individual provider: See Amion             

## 2017-11-09 DIAGNOSIS — M25561 Pain in right knee: Secondary | ICD-10-CM

## 2017-11-09 DIAGNOSIS — I499 Cardiac arrhythmia, unspecified: Secondary | ICD-10-CM

## 2017-11-09 LAB — UPEP/UIFE/LIGHT CHAINS/TP, 24-HR UR
% BETA, URINE: 23.8 %
ALBUMIN, U: 23.4 %
ALPHA 1 URINE: 3.3 %
ALPHA 2 UR: 17.5 %
FREE KAPPA/LAMBDA RATIO: 4.17 (ref 2.04–10.37)
Free Kappa Lt Chains,Ur: 22.6 mg/L (ref 1.35–24.19)
Free Lambda Lt Chains,Ur: 5.42 mg/L (ref 0.24–6.66)
GAMMA GLOBULIN URINE: 32 %
M-SPIKE %, URINE: 15.3 % — AB
M-Spike, Mg/24 Hr: 27 mg/24 hr — ABNORMAL HIGH
TOTAL PROTEIN, URINE-UPE24: 13.6 mg/dL
Total Protein, Urine-Ur/day: 177 mg/24 hr — ABNORMAL HIGH (ref 30–150)
Total Volume: 1300

## 2017-11-09 LAB — CBC
HEMATOCRIT: 19.3 % — AB (ref 36.0–46.0)
HEMOGLOBIN: 6.2 g/dL — AB (ref 12.0–15.0)
MCH: 33.3 pg (ref 26.0–34.0)
MCHC: 32.1 g/dL (ref 30.0–36.0)
MCV: 103.8 fL — AB (ref 78.0–100.0)
PLATELETS: 172 10*3/uL (ref 150–400)
RBC: 1.86 MIL/uL — AB (ref 3.87–5.11)
RDW: 17 % — ABNORMAL HIGH (ref 11.5–15.5)
WBC: 4.5 10*3/uL (ref 4.0–10.5)

## 2017-11-09 MED ORDER — MUSCLE RUB 10-15 % EX CREA
TOPICAL_CREAM | CUTANEOUS | Status: DC | PRN
  Administered 2017-11-10: 1 via TOPICAL
  Filled 2017-11-09: qty 85

## 2017-11-09 NOTE — Progress Notes (Signed)
Pt asking to go to Residential Hosp[facility] in [facility]. Referral given to [place] in house rep.

## 2017-11-09 NOTE — Clinical Social Work Note (Signed)
Clinical Social Work Assessment  Patient Details  Name: Mindy Taylor MRN: [id number] Date of Birth: 04-Jun-1928  Date of referral:  11/09/17               Reason for consult:  Facility Placement                Permission sought to share information with:  Facility Sport and exercise psychologist, Family Supports Permission granted to share information::  Yes, Verbal Permission Granted  Name::     Blanca Carreon  Agency::     Relationship::  Niece  Contact Information:  [phone]  Housing/Transportation Living arrangements for the past 2 months:  Apartment(Handicap Apartment) Source of Information:  Patient Patient Interpreter Needed:  None Criminal Activity/Legal Involvement Pertinent to Current Situation/Hospitalization:  No - Comment as needed Significant Relationships:  Other Family Members Lives with:  Self Do you feel safe going back to the place where you live?  (PT recommending SNF) Need for family participation in patient care:  No (Coment)  Care giving concerns:  Patient from home alone. Patient reported that she lives in a handicap apartment and is independent with ADLs at baseline. Patient reported she has a cane and walker at home. Patient reported that she is currently getting a shot in her stomach to increase her blood. PT recommending SNF;Supervision for mobility OOB.   Social Worker assessment / plan:  CSW spoke with patient at bedside regarding PT recommendation for SNF. Patient reported that she is interested in going to SNF prior to returning home and that she eventually wants to go to [place] [facility]. CSW explained SNF placement process, patient verbalized understanding. Patient reported that she prefers to go to a SNF in [facility]. CSW agreed to complete patient's FL2 and follow up with bed offers.   CSW completed patient's FL2 and will follow up with bed offers. CSW started patient's PASRR, currently pending MUST ID [id number]. CSW will continue to follow and  assist with discharge planning.    Employment status:  Retired Forensic scientist:  Medicare PT Recommendations:  [place] / Referral to community resources:  [place]  Patient/Family's Response to care:  Patient/patient's family appreciative of CSW assistance with discharge planning with SNF placement.   Patient/Family's Understanding of and Emotional Response to Diagnosis, Current Treatment, and Prognosis:  Patient presented calm and verbalized understanding of current treatment plan. Patient verbalized plan to dc to SNF for ST rehab before discharging home. Patient reported that she eventually plans to transition to [place] [facility] and reported that she has met with hospital liaison from [place] at [facility].   Emotional Assessment Appearance:  Appears stated age Attitude/Demeanor/Rapport:  Other(Cooperative) Affect (typically observed):  Calm, Appropriate Orientation:  Oriented to Self, Oriented to Place, Oriented to  Time, Oriented to Situation Alcohol / Substance use:  Not Applicable Psych involvement (Current and /or in the community):  No (Comment)  Discharge Needs  Concerns to be addressed:  Care Coordination Readmission within the last 30 days:  No Current discharge risk:  Lives alone Barriers to Discharge:  Awaiting State Approval ([place])   Burnis Medin, LCSW 11/09/2017, 2:45 PM

## 2017-11-09 NOTE — Progress Notes (Signed)
OT Cancellation Note  Patient Details Name: Mindy Taylor MRN: [id number] DOB: [birth year]   Cancelled Treatment:      Noted plan for SNF- will defer OT eval to SNF  Thanks, Kari Baars, OT [phone] Payton Mccallum D 11/09/2017, 3:37 PM

## 2017-11-09 NOTE — Evaluation (Signed)
Physical Therapy Evaluation Patient Details Name: Mindy Taylor MRN: [id number] DOB: [birth year] Today's Date: 11/09/2017   History of Present Illness  Mindy Taylor is a 84 y.o. female with medical history significant for multiple myeloma, CVA, HTN, who presented to the ED with complaints of fatigue, shortness of breath with exertion, and dizziness, of 3 weeks duration.  Also reports left lower extremity swelling over the past 3 weeks.  Patient denies chest pain, no cough, no fever or chills.  She reports 3-4 times weekly use of 2 tablets ibuprofen.  Reports stool Color is mostly unchanged, denies blood in stools, denies abdominal pain. In the ED, Hemoglobin low at 6.4, down from 7.82 months ago, with baseline~ 10 and 11. EDP-notes some blood in rectum, notes a small amount of gross blood in stool in rectum, GI was consulted in the ED. Patient refused blood transfusion in the ED-she is a Jehovah's Witness. Pt admitted for further management. Pt Jehovah's Witness and refusing blood transfusions    Clinical Impression  Patient lives alone in a handicap accessible apartment but does not have assistance in the area. Patient is weak and needs assistance for safety with transfers and gait with RW 40 feet as she would be unable to safely prepare meals and perform hygiene activities independently at home. Pt admitted with above diagnosis. Pt currently with functional limitations due to the deficits listed below (see PT Problem List).  Pt will benefit from skilled PT to increase their independence and safety with mobility to allow discharge to the venue listed below.       Follow Up Recommendations SNF;Supervision for mobility/OOB    Equipment Recommendations  Other (comment)(to be determined at next venue of care)    Recommendations for Other Services       Precautions / Restrictions Precautions Precautions: Fall Restrictions Weight Bearing Restrictions: No      Mobility  Bed Mobility Overal  bed mobility: Modified Independent                Transfers Overall transfer level: Needs assistance   Transfers: Sit to/from Stand;Stand Pivot Transfers Sit to Stand: Supervision Stand pivot transfers: Supervision       General transfer comment: normally rocks to stand to generate enough power up; able to stand on 2nd rocking sequence attempt.  Ambulation/Gait Ambulation/Gait assistance: Min guard Gait Distance (Feet): 40 Feet Assistive device: Rolling walker (2 wheeled) Gait Pattern/deviations: Step-through pattern;Decreased step length - left;Decreased stance time - right;Decreased stride length;Decreased stance time - left;Trunk flexed Gait velocity: decreased   General Gait Details: tends to lift walker up to turn  Stairs            Wheelchair Mobility    Modified Rankin (Stroke Patients Only)       Balance Overall balance assessment: Mild deficits observed, not formally tested                                           Pertinent Vitals/Pain Pain Assessment: No/denies pain    Home Living Family/patient expects to be discharged to:: Private residence Living Arrangements: Alone   Type of Home: Apartment Home Access: Level entry     Home Layout: One level Home Equipment: Toilet riser;Cane - quad;Walker - 2 wheels;Grab bars - toilet;Grab bars - tub/shower;Tub bench;Hand held shower head(narrow base quad cane)      Prior Function Level of Independence:  Independent with assistive device(s)               Hand Dominance        Extremity/Trunk Assessment   Upper Extremity Assessment Upper Extremity Assessment: Generalized weakness    Lower Extremity Assessment Lower Extremity Assessment: Generalized weakness    Cervical / Trunk Assessment Cervical / Trunk Assessment: Kyphotic  Communication   Communication: No difficulties  Cognition Arousal/Alertness: Awake/alert Behavior During Therapy: WFL for tasks  assessed/performed Overall Cognitive Status: Within Functional Limits for tasks assessed                                        General Comments      Exercises     Assessment/Plan    PT Assessment Patient needs continued PT services  PT Problem List Decreased strength;Decreased activity tolerance;Decreased knowledge of use of DME;Decreased balance;Decreased mobility       PT Treatment Interventions DME instruction;Therapeutic activities;Gait training;Therapeutic exercise;Patient/family education;Balance training;Functional mobility training;Neuromuscular re-education    PT Goals (Current goals can be found in the Care Plan section)  Acute Rehab PT Goals Patient Stated Goal: go to rehab to get stronger before going home to live alone again. PT Goal Formulation: With patient/family Time For Goal Achievement: 11/16/17 Potential to Achieve Goals: Fair    Frequency Min 2X/week   Barriers to discharge Decreased caregiver support patient lives alone and family live in [place] PT "6 Clicks" Daily Activity  Outcome Measure Difficulty turning over in bed (including adjusting bedclothes, sheets and blankets)?: A Little Difficulty moving from lying on back to sitting on the side of the bed? : A Little Difficulty sitting down on and standing up from a chair with arms (e.g., wheelchair, bedside commode, etc,.)?: A Little Help needed moving to and from a bed to chair (including a wheelchair)?: A Little Help needed walking in hospital room?: A Little Help needed climbing 3-5 steps with a railing? : A Lot 6 Click Score: 17    End of Session Equipment Utilized During Treatment: Gait belt Activity Tolerance: Patient tolerated treatment well Patient left: in chair;with family/visitor present;with call bell/phone within reach Nurse Communication: Mobility status PT Visit Diagnosis: Unsteadiness on feet (R26.81);Difficulty in  walking, not elsewhere classified (R26.2);Muscle weakness (generalized) (M62.81)    Time: 1610-9604 PT Time Calculation (min) (ACUTE ONLY): 33 min   Charges:   PT Evaluation $PT Eval Moderate Complexity: 1 Mod PT Treatments $Gait Training: 8-22 mins   PT G Codes:        Yahir Tavano D. Hartnett-Rands, MS, PT Per [place] #[id number] 11/09/2017, 10:32 AM

## 2017-11-09 NOTE — Progress Notes (Signed)
   11/09/17 1631  Clinical Encounter Type  Visited With Patient and family together  Visit Type Initial   Rounding on Palliative Patients.  Patient was seated in the chair with a relative present.  She was welcoming of the visit and has a realistic outlook and a positive outlook understanding that bc of her faith she does not accept blood transfusions this may cause issues, but she is grounded in her faith with what ever is to come she knows she will be alright.  Has support from her family and is waiting to see where she will be discharged to, tomorrow.  Will follow and support as needed. Chaplain Katherene Ponto

## 2017-11-09 NOTE — Progress Notes (Signed)
PMT progress note  Awake alert, in no distress, niece Analyah HCPOA agent and niece's husband at bedside, they met with [facility] [place] liaison Ms Merrilyn Puma.   PT note reviewed  Dr Antonieta Pert input appreciated.   BP 139/80 (BP Location: Right Arm)   Pulse 92   Temp 97.9 F (36.6 C) (Oral)   Resp 20   Ht '5\' 7"'$  (1.702 m)   Wt 87.1 kg (192 lb)   SpO2 99%   BMI 30.07 kg/m  In no distress Awake alert Communicative Non focal No edema Regular  Rectal mass, ?adeno carcinoma Low Hgb Jehovah's witness multiple myeloma, CVA, HTN  1. Agree with SNF rehab attempt, recommend palliative care follow patient at SNF. 2. Discussed with [facility] MD, appreciate [facility] [place] consult, patient will need hospice services after her rehab attempt.   15 minutes spent [facility] palliative medicine team [phone]

## 2017-11-09 NOTE — NC FL2 (Signed)
[place] LEVEL OF CARE SCREENING TOOL     IDENTIFICATION  Patient Name: Mindy Taylor Birthdate: 09-24-1928 Sex: female Admission Date (Current Location): 11/03/2017  [facility] and [place] Number:  Herbalist and Address:  [facility],  [address] [place], [place]      Provider Number: [id number]  Attending Physician Name and Address:  Alma Friendly, MD  Relative Name and Phone Number:  Micheala Morissette [phone]    Current Level of Care: Hospital Recommended Level of Care: [place] Prior Approval Number:    Date Approved/Denied:   PASRR Number: pending  Discharge Plan: SNF    Current Diagnoses: Patient Active Problem List   Diagnosis Date Noted  . Colonic mass   . Anemia 11/03/2017  . Insomnia 06/01/2017  . Contusion of left leg 04/20/2017  . Sore in nose 06/03/2016  . Cough 05/22/2016  . HLD (hyperlipidemia)   . Cellulitis 12/24/2015  . Cellulitis of left upper extremity 12/23/2015  . Well adult exam 11/19/2015  . Loss of weight 10/12/2015  . Normocytic anemia 05/07/2015  . Multiple myeloma ([place]) 04/03/2015  . Arthralgia 02/01/2015  . Right wrist pain 12/01/2014  . Arm pain, left 11/22/2014  . No blood products 03/11/2014  . Right knee pain 07/26/2013  . Chest pain 04/26/2013  . Left ankle swelling 03/04/2013  . Clostridium difficile diarrhea 10/22/2012  . Right hand pain 09/08/2012  . Elbow pain, right 03/15/2012  . Knee pain, left 10/02/2011  . Hypercalcemia 10/02/2011  . Hyperglycemia 08/08/2011  . Neck pain, chronic 05/05/2011  . Knee pain, bilateral 02/18/2011  . Fatigue 01/03/2011  . Bronchitis, acute, with bronchospasm 12/11/2010  . Wheezing 11/28/2010  . Preventative health care 11/28/2010  . Cerebral artery occlusion with cerebral infarction ([place]) 01/04/2010  . CVA (cerebral vascular accident) ([place]) 01/04/2010  . DIZZINESS 12/26/2009  . ATAXIA 12/26/2009  . PARESTHESIA  12/26/2009  . Morbid obesity due to excess calories ([place]) 09/07/2009  . ONYCHOMYCOSIS 03/09/2009  . TOBACCO USE, QUIT 03/09/2009  . CANDIDIASIS, VAGINAL 09/08/2008  . CERUMEN IMPACTION 06/05/2008  . Headache(784.0) 06/05/2008  . Epistaxis 06/05/2008  . Anxiety state 03/05/2007  . BEE STING 03/05/2007  . Dyslipidemia 12/08/2006  . Essential hypertension 12/08/2006  . Osteoarthritis 12/08/2006    Orientation RESPIRATION BLADDER Height & Weight     Self, Time, Situation, Place  Normal Continent Weight: 192 lb (87.1 kg) Height:  5' 7" (170.2 cm)  BEHAVIORAL SYMPTOMS/MOOD NEUROLOGICAL BOWEL NUTRITION STATUS      Continent Diet(see dc summary)  AMBULATORY STATUS COMMUNICATION OF NEEDS Skin   Limited Assist Verbally Other (Comment)(Left leg (cracking) Gauze Dressing)                       Personal Care Assistance Level of Assistance  Bathing, Feeding, Dressing Bathing Assistance: Limited assistance Feeding assistance: Independent Dressing Assistance: Limited assistance     Functional Limitations Info  Sight, Hearing, Speech Sight Info: Adequate Hearing Info: Adequate Speech Info: Adequate    SPECIAL CARE FACTORS FREQUENCY  PT (By licensed PT), OT (By licensed OT)     PT Frequency: 5x/week OT Frequency: 5x/week            Contractures Contractures Info: Not present    Additional Factors Info  Code Status, Allergies Code Status Info: DNR Allergies Info: Bee Venom;Spider Antivenin Black Widow Spider Antivenin (l.mactans);Ceftin Cefuroxime Axetil;           Current  Medications (11/09/2017):  This is the current hospital active medication list Current Facility-Administered Medications  Medication Dose Route Frequency Provider Last Rate Last Dose  . epoetin alfa (EPOGEN,PROCRIT) injection 20,000 Units  20,000 Units Subcutaneous Daily [name], MD   20,000 Units at 11/09/17 1033  . hydrALAZINE (APRESOLINE) injection 10 mg  10 mg Intravenous Q8H PRN  Alma Friendly, MD      . ondansetron [facility]) tablet 4 mg  4 mg Oral Q6H PRN Emokpae, Ejiroghene E, MD   4 mg at 11/04/17 2023   Or  . ondansetron (ZOFRAN) injection 4 mg  4 mg Intravenous Q6H PRN Emokpae, Ejiroghene E, MD   4 mg at 11/08/17 0237  . pantoprazole (PROTONIX) EC tablet 40 mg  40 mg Oral BID Alma Friendly, MD   40 mg at 11/09/17 1033  . pravastatin (PRAVACHOL) tablet 40 mg  40 mg Oral q1800 Emokpae, Ejiroghene E, MD   40 mg at 11/08/17 1807  . traMADol (ULTRAM) tablet 50 mg  50 mg Oral Q12H PRN Alma Friendly, MD      . traZODone (DESYREL) tablet 50 mg  50 mg Oral QHS Emokpae, Ejiroghene E, MD   50 mg at 11/08/17 2326     Discharge Medications: Please see discharge summary for a list of discharge medications.  Relevant Imaging Results:  Relevant Lab Results:   Additional Information PT is a Jehovah Witness and wants no blood products.   SSN [id number]  Burnis Medin, LCSW

## 2017-11-09 NOTE — Consult Note (Signed)
[facility]:   Met with pt and her niece Murna Backer in the room. The pt states that she lives in [place] alone and at this time she would like to go to a rehab facility to see if she can get stronger and then transfer home with hospice care or possibly to the [facility] if appropriate. She is eating well. She is alert and oriented and has no unmanaged symptoms despite a low hemoglobin. Cards given to both and we will follow at rehab facility and then evaluate at that time to assist in her needs. Webb Silversmith RN [phone]

## 2017-11-09 NOTE — Progress Notes (Signed)
Patient is requesting a stool softener. PCP was notified.   Also, patient has muscle pain at right knee.RN placed order from the standing orders for muscle cream for patient.

## 2017-11-09 NOTE — Progress Notes (Signed)
Pt now want to go to SNF for rehab before going home.

## 2017-11-09 NOTE — Progress Notes (Signed)
The pathology on the rectal mass came back as high-grade dysplasia.  One has to wonder if this is actually an invasive adenocarcinoma.  The specimen that was only superficially obtained.  She is not a candidate for any surgery.  She will not take blood transfusions.  Her hemoglobin is 6.2 today.  She is getting daily Procrit.  She does feel very tired.  She says she just "wants to go home".  I know exactly what she means by this.  I totally agree that it is time to get palliative care and hospice involved.  I think they would help out in this situation.  With this colonic mass, I suspect that she we will have a constant element of bleeding.  She is not hurting.  I am not sure how much she is eating.  Her vital signs are all stable.  Her blood pressure is 139/80.  There is really nothing new on physical exam.  For right now, I would just continue the Procrit.  I think this makes a lot of sense to try to improve her hemoglobin.  Thankfully, she is not hurting.  Again, I think that hospice would be a very good idea for her.  Lattie Haw, MD  Proverbs 22:6

## 2017-11-09 NOTE — Progress Notes (Signed)
PROGRESS NOTE  Mindy Taylor DPO:[id number] DOB: [birth year] DOA: 11/03/2017 PCP: Cassandria Anger, MD  HPI/Recap of past 24 hours: Mindy Taylor is a 84 y.o. female with medical history significant for multiple myeloma, CVA, HTN, who presented to the ED with complaints of fatigue, shortness of breath with exertion, and dizziness, of 3 weeks duration.  Also reports left lower extremity swelling over the past 3 weeks.  Patient denies chest pain, no cough, no fever or chills.  She reports 3-4 times weekly use of 2 tablets ibuprofen.  Reports stool Color is mostly unchanged, denies blood in stools, denies abdominal pain. In the ED, Hemoglobin low at 6.4, down from 7.82 months ago, with baseline~ 10 and 11. EDP-notes some blood in rectum, notes a small amount of gross blood in stool in rectum, GI was consulted in the ED. Patient refused blood transfusion in the ED-she is a Jehovah's Witness. Pt admitted for further management  Today, pt denies any new complaints. Had an extensive conversation with pt and family members about overall prognosis of medical state. Also was able to let them know that there will be no role for radiation for the ??mass as there is no confirmed diagnosis. Pt will be d/c to SNF for rehab and home hospice afterwards.   Assessment/Plan: Active Problems:   Essential hypertension   Cerebral artery occlusion with cerebral infarction (HCC)   Multiple myeloma (HCC)   Anemia   Colonic mass  Symptomatic anemia likely due to GI bleed/mass in sigmoid colon No evidence of active bleeding  Baseline Hemoglobin is 10-11g/dl, last hgb 6.2 Patient is Jehovah witness, and refuses blood transfusion GI on board: Rec EGD, colonoscopy Colonoscopy showed polypoid mass in the recto-sigmoid colon likely a malignancy Vs large/bulky broad based polyp, pathology report shows Tubular villous adenoma with high grade dysplasia (awaiting oncology input). GI cannot do a resection/polypectomy  without transfusion Spoke to Rad-onc, no role for radiation for the rectal mass as there is no confirmed diagnosis EGD showed a single non-bleeding angiodysplastic lesion in the stomach treated with APC GI rec CT abdomen/pelvis to further evaluate mass which didn't show mass in colon Hematology consulted for none blood transfusion therapy, s/sp feraheme, epogen/procrit Monitor closely, minimal blood draws  Multiple myeloma Patient previously followed with Dr.Enerva,patient declined treatment since 2016 and is not ready to continue medications Oncology on board  HTN BP stable Held home norvasc, benicar PRN hydralazine      Code Status: DNR  Family Communication: Spoke to niece and husband at bedside   Disposition Plan: SNF is challenging as pt will need epogen/procrit on a daily basis   Consultants:  GI  Oncology   Procedures:  EGD/Colonoscopy on 11/05/17  Antimicrobials:  None  DVT prophylaxis:  SCDs   Objective: Vitals:   11/08/17 1320 11/08/17 2115 11/09/17 0507 11/09/17 1338  BP: 126/72 (!) 158/77 139/80 (!) 142/70  Pulse: 100 (!) 101 92 (!) 113  Resp: '16 18 20 17  '$ Temp: 98.8 F (37.1 C) 98.3 F (36.8 C) 97.9 F (36.6 C) 99.1 F (37.3 C)  TempSrc: Oral Oral Oral Oral  SpO2: 98% 100% 99% 100%  Weight:      Height:        Intake/Output Summary (Last 24 hours) at 11/09/2017 1955 Last data filed at 11/09/2017 0900 Gross per 24 hour  Intake 240 ml  Output -  Net 240 ml   Filed Weights   11/03/17 1247 11/03/17 2337 11/05/17 1020  Weight: 88.5 kg (  195 lb) 87.5 kg (192 lb 12.8 oz) 87.1 kg (192 lb)    Exam:   General:  NAD, pallor  Cardiovascular: S1, S2 present  Respiratory: CTAB   Abdomen: Soft, NT, ND, BS present   Musculoskeletal: No pedal edema b/l  Skin: Normal  Psychiatry: Normal mood    Data Reviewed: CBC: Recent Labs  Lab 11/04/17 0849 11/04/17 2027 11/05/17 1651 11/06/17 0416 11/09/17 0334  WBC 4.3 5.6 4.8 5.9  4.5  NEUTROABS  --   --  2.3 3.2  --   HGB 6.2* 6.6* 5.8* 5.9* 6.2*  HCT 19.2* 20.5* 17.8* 18.5* 19.3*  MCV 102.7* 101.5* 102.3* 102.2* 103.8*  PLT 172 183 142* 157 353   Basic Metabolic Panel: Recent Labs  Lab 11/03/17 1344 11/06/17 0416  NA 136 135  K 4.3 3.3*  CL 108 109  CO2 26 25  GLUCOSE 104* 102*  BUN 21* 11  CREATININE 1.23* 1.03*  CALCIUM 8.8* 8.8*   GFR: Estimated Creatinine Clearance: 43.6 mL/min (A) (by C-G formula based on SCr of 1.03 mg/dL (H)). Liver Function Tests: Recent Labs  Lab 11/03/17 1703  AST 19  ALT 15  ALKPHOS 45  BILITOT 0.6  PROT 12.0*  ALBUMIN 2.8*   No results for input(s): LIPASE, AMYLASE in the last 168 hours. No results for input(s): AMMONIA in the last 168 hours. Coagulation Profile: No results for input(s): INR, PROTIME in the last 168 hours. Cardiac Enzymes: No results for input(s): CKTOTAL, CKMB, CKMBINDEX, TROPONINI in the last 168 hours. BNP (last 3 results) No results for input(s): PROBNP in the last 8760 hours. HbA1C: No results for input(s): HGBA1C in the last 72 hours. CBG: Recent Labs  Lab 11/03/17 1258  GLUCAP 85   Lipid Profile: No results for input(s): CHOL, HDL, LDLCALC, TRIG, CHOLHDL, LDLDIRECT in the last 72 hours. Thyroid Function Tests: No results for input(s): TSH, T4TOTAL, FREET4, T3FREE, THYROIDAB in the last 72 hours. Anemia Panel: No results for input(s): VITAMINB12, FOLATE, FERRITIN, TIBC, IRON, RETICCTPCT in the last 72 hours. Urine analysis:    Component Value Date/Time   COLORURINE YELLOW 11/03/2017 1344   APPEARANCEUR CLEAR 11/03/2017 1344   LABSPEC 1.013 11/03/2017 1344   PHURINE 5.0 11/03/2017 1344   GLUCOSEU NEGATIVE 11/03/2017 1344   GLUCOSEU NEGATIVE 09/06/2012 0900   HGBUR LARGE (A) 11/03/2017 1344   BILIRUBINUR NEGATIVE 11/03/2017 1344   KETONESUR NEGATIVE 11/03/2017 1344   PROTEINUR NEGATIVE 11/03/2017 1344   UROBILINOGEN 0.2 03/14/2015 1300   NITRITE NEGATIVE 11/03/2017 1344    LEUKOCYTESUR NEGATIVE 11/03/2017 1344   Sepsis Labs: '@LABRCNTIP'$ (procalcitonin:4,lacticidven:4)  )No results found for this or any previous visit (from the past 240 hour(s)).    Studies: No results found.  Scheduled Meds: . epoetin alfa  20,000 Units Subcutaneous Daily  . pantoprazole  40 mg Oral BID  . pravastatin  40 mg Oral q1800  . traZODone  50 mg Oral QHS    Continuous Infusions:    LOS: 5 days     Alma Friendly, MD [facility]   If 7PM-7AM, please contact night-coverage [email or web address] Password [facility] 11/09/2017, 7:55 PM

## 2017-11-10 ENCOUNTER — Inpatient Hospital Stay (HOSPITAL_COMMUNITY): Payer: Medicare Other

## 2017-11-10 LAB — CBC WITH DIFFERENTIAL/PLATELET
Basophils Absolute: 0 10*3/uL (ref 0.0–0.1)
Basophils Relative: 1 %
Eosinophils Absolute: 0.1 10*3/uL (ref 0.0–0.7)
Eosinophils Relative: 2 %
HEMATOCRIT: 19.4 % — AB (ref 36.0–46.0)
HEMOGLOBIN: 6.2 g/dL — AB (ref 12.0–15.0)
LYMPHS ABS: 1.6 10*3/uL (ref 0.7–4.0)
LYMPHS PCT: 30 %
MCH: 33.2 pg (ref 26.0–34.0)
MCHC: 32 g/dL (ref 30.0–36.0)
MCV: 103.7 fL — AB (ref 78.0–100.0)
MONOS PCT: 14 %
Monocytes Absolute: 0.7 10*3/uL (ref 0.1–1.0)
NEUTROS ABS: 2.9 10*3/uL (ref 1.7–7.7)
NEUTROS PCT: 53 %
Platelets: 162 10*3/uL (ref 150–400)
RBC: 1.87 MIL/uL — ABNORMAL LOW (ref 3.87–5.11)
RDW: 17.1 % — ABNORMAL HIGH (ref 11.5–15.5)
WBC: 5.4 10*3/uL (ref 4.0–10.5)

## 2017-11-10 MED ORDER — MIDAZOLAM HCL 2 MG/2ML IJ SOLN
INTRAMUSCULAR | Status: AC | PRN
  Administered 2017-11-10: 1 mg via INTRAVENOUS

## 2017-11-10 MED ORDER — SODIUM CHLORIDE 0.9 % IV SOLN
INTRAVENOUS | Status: AC
  Filled 2017-11-10: qty 250

## 2017-11-10 MED ORDER — FENTANYL CITRATE (PF) 100 MCG/2ML IJ SOLN
INTRAMUSCULAR | Status: AC
  Filled 2017-11-10: qty 2

## 2017-11-10 MED ORDER — MIDAZOLAM HCL 2 MG/2ML IJ SOLN
INTRAMUSCULAR | Status: AC
  Filled 2017-11-10: qty 2

## 2017-11-10 MED ORDER — ENSURE ENLIVE PO LIQD
237.0000 mL | Freq: Two times a day (BID) | ORAL | Status: DC
  Administered 2017-11-10 – 2017-11-16 (×6): 237 mL via ORAL

## 2017-11-10 MED ORDER — FENTANYL CITRATE (PF) 100 MCG/2ML IJ SOLN
INTRAMUSCULAR | Status: AC | PRN
  Administered 2017-11-10 (×2): 50 ug via INTRAVENOUS

## 2017-11-10 MED ORDER — IRBESARTAN 150 MG PO TABS
150.0000 mg | ORAL_TABLET | Freq: Every day | ORAL | Status: DC
  Administered 2017-11-10 – 2017-11-12 (×3): 150 mg via ORAL
  Filled 2017-11-10 (×3): qty 1

## 2017-11-10 MED ORDER — LIDOCAINE HCL (PF) 1 % IJ SOLN
INTRAMUSCULAR | Status: AC | PRN
  Administered 2017-11-10: 20 mL

## 2017-11-10 NOTE — Progress Notes (Signed)
Nutrition Follow-up  DOCUMENTATION CODES:   Obesity unspecified  INTERVENTION:   Ensure Enlive po BID, each supplement provides 350 kcal and 20 grams of protein  NUTRITION DIAGNOSIS:   Inadequate oral intake related to poor appetite as evidenced by per patient/family report.  Ongoing  GOAL:   Patient will meet greater than or equal to 90% of their needs  Not meeting  MONITOR:   Diet advancement, Weight trends, Labs, I & O's  REASON FOR ASSESSMENT:   Malnutrition Screening Tool    ASSESSMENT:   84 y.o. female with medical history significant for multiple myeloma, CVA, HTN, who presented to the ED with complaints of fatigue, shortness of breath with exertion, and dizziness, of 3 weeks duration.  Also reports left lower extremity swelling over the past 3 weeks.   6/13-EDG/colonoscopy shows mass in rectosigmoid colon 6/18- bone marrow biopsy  Pt's intake remains off and on. Meal completions charted as 0-75% for her last eight meals. Daughter reports she likes the grilled fish and will eat majority of that. She did not have breakfast this morning. She is currently NPO for CT guided biopsy. Pt was drinking Ensure PTA. Will send these to maximize calories and protein with off/on intake. Will continue to monitor.   Plan is for pt to go to skilled nursing for rehab and home with hospice afterwards. There is some difficulty with placement due to pt needing daily epogen/procrit.   Medications reviewed.  Labs reviewed: K 3.3 (L)   Diet Order:   Diet Order           Diet regular Room service appropriate? Yes; Fluid consistency: Thin  Diet effective now          EDUCATION NEEDS:   Not appropriate for education at this time  Skin:  Skin Assessment: Reviewed RN Assessment  Last BM:  11/08/17  Height:   Ht Readings from Last 1 Encounters:  11/05/17 '5\' 7"'$  (1.702 m)    Weight:   Wt Readings from Last 1 Encounters:  11/05/17 192 lb (87.1 kg)    Ideal Body Weight:   61.3 kg  BMI:  Body mass index is 30.07 kg/m.  Estimated Nutritional Needs:   Kcal:  1700-1900  Protein:  65-75g  Fluid:  1.7L/day    Mariana Single RD, LDN Clinical Nutrition Pager # [phone]

## 2017-11-10 NOTE — Progress Notes (Signed)
Ms. Mindy Taylor is having some issues this morning.  It looks like she hurt her knee last night.  Her right knee is bothering her quite a bit.  She says it happened when she has been put back into bed.  She is supposed to go to skilled nursing.  She asked me about treatment for the myeloma.  She is wondering if she could do the "shots under the skin."  I told her that her myeloma is to aggressive and to progressive for that right now.  However, we could do IV therapy that would work.  If she does wish to have treatment, she will need to have a bone marrow biopsy done so that we can see exactly how involved her bone marrow is.  Her M spike is now 5.3 g/dL.  Her IgG level is 8400 mg/dL.  Her lambda light chain is 7.4 mg/dL.  I know that Kyprolis/Cytoxan would work quite well for her.  I am sure that her myeloma is really the main reason for her not to be making a blood.  I do not think that anything that we do to get her blood count up will really help that much until the myeloma is treated.  I told her that if she wants to have chemotherapy, we could easily do this and that her skilled nursing facility could transfer her to our office for treatment.  Her hemoglobin is 6.2 today.  This is stable.  She is getting the daily Procrit.  Her appetite is doing okay.  She is having no nausea or vomiting.  She is having no fever.  There is no obvious bleeding.  Hopefully, she will be able to stay for the bone marrow biopsy.  I would hate for her to have to come back to the hospital for this.  It be so much easier if this were done as an inpatient.  Lattie Haw, MD  1 Corinthians 16:13

## 2017-11-10 NOTE — Procedures (Signed)
Pre-procedure Diagnosis: Multiple Myeloma Post-procedure Diagnosis: Same  Technically successful CT guided bone marrow aspiration and biopsy of left iliac crest.   Complications: None Immediate  EBL: None  SignedSandi Mariscal Pager: [phone] 11/10/2017, 11:54 AM

## 2017-11-10 NOTE — Progress Notes (Signed)
PROGRESS NOTE  Mindy Taylor IFO:[id number] DOB: 11-05-1928 DOA: 11/03/2017 PCP: Cassandria Anger, MD  HPI/Recap of past 24 hours: Mindy Taylor is a 84 y.o. female with medical history significant for multiple myeloma, CVA, HTN, who presented to the ED with complaints of fatigue, shortness of breath with exertion, and dizziness, of 3 weeks duration.  Also reports left lower extremity swelling over the past 3 weeks.  Patient denies chest pain, no cough, no fever or chills.  She reports 3-4 times weekly use of 2 tablets ibuprofen.  Reports stool Color is mostly unchanged, denies blood in stools, denies abdominal pain. In the ED, Hemoglobin low at 6.4, down from 7.82 months ago, with baseline~ 10 and 11. EDP-notes some blood in rectum, notes a small amount of gross blood in stool in rectum, GI was consulted in the ED. Patient refused blood transfusion in the ED-she is a Jehov[employer]'s Witness. Pt admitted for further management  Today, pt denies any new complaints. Please do not order any blood draw except absolutely necessary   Assessment/Plan: Active Problems:   Essential hypertension   Cerebral artery occlusion with cerebral infarction (HCC)   Multiple myeloma (HCC)   Anemia   Colonic mass  Symptomatic anemia likely due to GI bleed/mass in sigmoid colon No evidence of active bleeding  Baseline Hemoglobin is 10-11g/dl, last hgb 6.2 Patient is Jehovah witness, and refuses blood transfusion GI on board: Rec EGD, colonoscopy Colonoscopy showed polypoid mass in the recto-sigmoid colon likely a malignancy Vs large/bulky broad based polyp, pathology report shows Tubular villous adenoma with high grade dysplasia (awaiting oncology input). GI cannot do a resection/polypectomy without transfusion Spoke to Rad-onc, no role for radiation for the rectal mass as there is no confirmed diagnosis EGD showed a single non-bleeding angiodysplastic lesion in the stomach treated with APC GI rec CT  abdomen/pelvis to further evaluate mass which didn't show mass in colon Hematology consulted for none blood transfusion therapy, s/sp feraheme, epogen/procrit Please do not order any blood draw except absolutely necessary Dr Marin Olp will decide if continuing procrit or aranesp will be needed going forth, as pt will be seeing him at the cancer center as an outpt  Multiple myeloma Patient previously followed with Dr.Enerva,patient declined treatment since 2016 Currently, pt agreeable to try treatment S/P bone marrow biopsy on 11/10/17, oncology will follow up results Dr Marin Olp Oncology, will follow as outpatient for further management  HTN Start home benicar Held home norvasc PRN hydralazine      Code Status: DNR  Family Communication: Spoke to niece  Disposition Plan: Discharge to SNF 11/11/17   Consultants:  GI  Oncology   Procedures:  EGD/Colonoscopy on 11/05/17  Antimicrobials:  None  DVT prophylaxis:  SCDs   Objective: Vitals:   11/10/17 1133 11/10/17 1135 11/10/17 1140 11/10/17 1326  BP: (!) 191/92 (!) 169/86 (!) 140/98 (!) 150/80  Pulse: (!) 103 (!) 101 (!) 104 (!) 101  Resp: '14 14 12 19  '$ Temp:    99.1 F (37.3 C)  TempSrc:    Oral  SpO2: 97% 100% 100% 99%  Weight:      Height:        Intake/Output Summary (Last 24 hours) at 11/10/2017 1836 Last data filed at 11/09/2017 2100 Gross per 24 hour  Intake 240 ml  Output -  Net 240 ml   Filed Weights   11/03/17 1247 11/03/17 2337 11/05/17 1020  Weight: 88.5 kg (195 lb) 87.5 kg (192 lb 12.8 oz) 87.1 kg (  192 lb)    Exam:   General:  NAD, pallor  Cardiovascular: S1, S2 present  Respiratory: CTAB   Abdomen: Soft, NT, ND, BS present   Musculoskeletal: No pedal edema b/l  Skin: Normal  Psychiatry: Normal mood    Data Reviewed: CBC: Recent Labs  Lab 11/04/17 2027 11/05/17 1651 11/06/17 0416 11/09/17 0334 Nov 18, 2017 0725  WBC 5.6 4.8 5.9 4.5 5.4  NEUTROABS  --  2.3 3.2  --  2.9    HGB 6.6* 5.8* 5.9* 6.2* 6.2*  HCT 20.5* 17.8* 18.5* 19.3* 19.4*  MCV 101.5* 102.3* 102.2* 103.8* 103.7*  PLT 183 142* 157 172 425   Basic Metabolic Panel: Recent Labs  Lab 11/06/17 0416  NA 135  K 3.3*  CL 109  CO2 25  GLUCOSE 102*  BUN 11  CREATININE 1.03*  CALCIUM 8.8*   GFR: Estimated Creatinine Clearance: 43.6 mL/min (A) (by C-G formula based on SCr of 1.03 mg/dL (H)). Liver Function Tests: No results for input(s): AST, ALT, ALKPHOS, BILITOT, PROT, ALBUMIN in the last 168 hours. No results for input(s): LIPASE, AMYLASE in the last 168 hours. No results for input(s): AMMONIA in the last 168 hours. Coagulation Profile: No results for input(s): INR, PROTIME in the last 168 hours. Cardiac Enzymes: No results for input(s): CKTOTAL, CKMB, CKMBINDEX, TROPONINI in the last 168 hours. BNP (last 3 results) No results for input(s): PROBNP in the last 8760 hours. HbA1C: No results for input(s): HGBA1C in the last 72 hours. CBG: No results for input(s): GLUCAP in the last 168 hours. Lipid Profile: No results for input(s): CHOL, HDL, LDLCALC, TRIG, CHOLHDL, LDLDIRECT in the last 72 hours. Thyroid Function Tests: No results for input(s): TSH, T4TOTAL, FREET4, T3FREE, THYROIDAB in the last 72 hours. Anemia Panel: No results for input(s): VITAMINB12, FOLATE, FERRITIN, TIBC, IRON, RETICCTPCT in the last 72 hours. Urine analysis:    Component Value Date/Time   COLORURINE YELLOW 11/03/2017 1344   APPEARANCEUR CLEAR 11/03/2017 1344   LABSPEC 1.013 11/03/2017 1344   PHURINE 5.0 11/03/2017 1344   GLUCOSEU NEGATIVE 11/03/2017 1344   GLUCOSEU NEGATIVE 09/06/2012 0900   HGBUR LARGE (A) 11/03/2017 1344   BILIRUBINUR NEGATIVE 11/03/2017 1344   KETONESUR NEGATIVE 11/03/2017 1344   PROTEINUR NEGATIVE 11/03/2017 1344   UROBILINOGEN 0.2 03/14/2015 1300   NITRITE NEGATIVE 11/03/2017 1344   LEUKOCYTESUR NEGATIVE 11/03/2017 1344   Sepsis  Labs: '@LABRCNTIP'$ (ZDGLOVFIEPPIR:5,JOACZYSAYTK:1)  )No results found for this or any previous visit (from the past 240 hour(s)).    Studies: Ct Biopsy  Result Date: 18-Nov-2017 INDICATION: History of multiple myeloma. Please perform CT-guided bone marrow biopsy for tissue diagnostic purposes. EXAM: CT-GUIDED BONE MARROW BIOPSY AND ASPIRATION MEDICATIONS: None ANESTHESIA/SEDATION: Fentanyl 100 mcg IV; Versed 1 mg IV Sedation Time: 10 Minutes; The patient was continuously monitored during the procedure by the interventional radiology nurse under my direct supervision. COMPLICATIONS: None immediate. PROCEDURE: Informed consent was obtained from the patient following an explanation of the procedure, risks, benefits and alternatives. The patient understands, agrees and consents for the procedure. All questions were addressed. A time out was performed prior to the initiation of the procedure. The patient was positioned prone and non-contrast localization CT was performed of the pelvis to demonstrate the iliac marrow spaces. The operative site was prepped and draped in the usual sterile fashion. Under sterile conditions and local anesthesia, a 22 gauge spinal needle was utilized for procedural planning. Next, an 11 gauge coaxial bone biopsy needle was advanced into the left iliac marrow space.  Needle position was confirmed with CT imaging. Initially, bone marrow aspiration was performed. Next, a bone marrow biopsy was obtained with the 11 gauge outer bone marrow device. Samples were prepared with the cytotechnologist and deemed adequate. The needle was removed intact. Hemostasis was obtained with compression and a dressing was placed. The patient tolerated the procedure well without immediate post procedural complication. IMPRESSION: Successful CT guided left iliac bone marrow aspiration and core biopsy. Electronically Signed   By: Sandi Mariscal M.D.   On: 11/10/2017 12:32   Dg Knee Right Port  Result Date:  11/10/2017 CLINICAL DATA:  Twisted the right knee, now with diffuse right knee pain EXAM: PORTABLE RIGHT KNEE - 1-2 VIEW COMPARISON:  None. FINDINGS: There is significant tricompartmental degenerative joint disease of the right knee primarily involving the lateral compartment where there is almost complete loss of joint space with sclerosis and spurring. Loss of medial compartment and patellofemoral compartment joint space also is noted with degenerative spurring. No acute fracture is seen. No significant joint effusion is noted. IMPRESSION: Significant tricompartmental degenerative joint disease of the right knee primarily involving the lateral compartment. Electronically Signed   By: Ivar Drape M.D.   On: 11/10/2017 09:40   Ct Bone Marrow Biopsy & Aspiration  Result Date: 11/10/2017 INDICATION: History of multiple myeloma. Please perform CT-guided bone marrow biopsy for tissue diagnostic purposes. EXAM: CT-GUIDED BONE MARROW BIOPSY AND ASPIRATION MEDICATIONS: None ANESTHESIA/SEDATION: Fentanyl 100 mcg IV; Versed 1 mg IV Sedation Time: 10 Minutes; The patient was continuously monitored during the procedure by the interventional radiology nurse under my direct supervision. COMPLICATIONS: None immediate. PROCEDURE: Informed consent was obtained from the patient following an explanation of the procedure, risks, benefits and alternatives. The patient understands, agrees and consents for the procedure. All questions were addressed. A time out was performed prior to the initiation of the procedure. The patient was positioned prone and non-contrast localization CT was performed of the pelvis to demonstrate the iliac marrow spaces. The operative site was prepped and draped in the usual sterile fashion. Under sterile conditions and local anesthesia, a 22 gauge spinal needle was utilized for procedural planning. Next, an 11 gauge coaxial bone biopsy needle was advanced into the left iliac marrow space. Needle position  was confirmed with CT imaging. Initially, bone marrow aspiration was performed. Next, a bone marrow biopsy was obtained with the 11 gauge outer bone marrow device. Samples were prepared with the cytotechnologist and deemed adequate. The needle was removed intact. Hemostasis was obtained with compression and a dressing was placed. The patient tolerated the procedure well without immediate post procedural complication. IMPRESSION: Successful CT guided left iliac bone marrow aspiration and core biopsy. Electronically Signed   By: Sandi Mariscal M.D.   On: 11/10/2017 12:32    Scheduled Meds: . epoetin alfa  20,000 Units Subcutaneous Daily  . feeding supplement (ENSURE ENLIVE)  237 mL Oral BID BM  . fentaNYL      . midazolam      . pantoprazole  40 mg Oral BID  . pravastatin  40 mg Oral q1800  . traZODone  50 mg Oral QHS    Continuous Infusions: . sodium chloride       LOS: 6 days     Alma Friendly, MD [facility]   If 7PM-7AM, please contact night-coverage [email or web address] Password [facility] 11/10/2017, 6:36 PM

## 2017-11-10 NOTE — Clinical Social Work Placement (Signed)
CSW contacted patient's preferred SNFs [facility] and [facility]). [facility] reported that they were at capacity. [facility] SNF admissions staff, no answer. CSW left voicemail requesting return phone call. CSW provided patient with bed offers and update about preferred SNFs. Patient hopeful that [facility] will be able to offer patient a bed. Patient's PASRR currently pending. CSW will continue to follow and assist with discharge planning.  CLINICAL SOCIAL WORK PLACEMENT  NOTE  Date:  11/10/2017  Patient Details  Name: Mindy Taylor MRN: [id number] Date of Birth: 06-Jul-1928  Clinical Social Work is seeking post-discharge placement for this patient at the [place] level of care (*CSW will initial, date and re-position this form in  chart as items are completed):  Yes   Patient/family provided with [place] Work Department's list of facilities offering this level of care within the geographic area requested by the patient (or if unable, by the patient's family).  Yes   Patient/family informed of their freedom to choose among providers that offer the needed level of care, that participate in Medicare, Medicaid or managed care program needed by the patient, have an available bed and are willing to accept the patient.  Yes   Patient/family informed of [facility]'s ownership interest in [facility] and [facility], as well as of the fact that they are under no obligation to receive care at these facilities.  PASRR submitted to EDS on 11/09/17     PASRR number received on       Existing PASRR number confirmed on       FL2 transmitted to all facilities in geographic area requested by pt/family on 11/09/17     FL2 transmitted to all facilities within larger geographic area on       Patient informed that his/her managed care company has contracts with or will negotiate with certain facilities, including the following:         Yes   Patient/family informed of bed offers received.  Patient chooses bed at       Physician recommends and patient chooses bed at      Patient to be transferred to   on  .  Patient to be transferred to facility by       Patient family notified on   of transfer.  Name of family member notified:        PHYSICIAN       Additional Comment:    _______________________________________________ Burnis Medin, LCSW 11/10/2017, 3:11 PM

## 2017-11-10 NOTE — Progress Notes (Signed)
Referring Physician(s): Ennever,P  Supervising Physician: Sandi Mariscal  Patient Status:  [facility] - In-pt  Chief Complaint:  Multiple myeloma  Subjective: Patient familiar to IR service from prior bone marrow biopsy in 2016.  She has a known history of multiple myeloma (currently not on treatment), CVA 2011, hypertension.  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  She also has some right knee pain currently with no fracture on plain film.  She has elevated M spike, IgG level and lambda light chain.  Request received for CT-guided bone marrow biopsy before treatment initiated.  She currently denies fever, headache, chest pain, worsening dyspnea, abdominal/back pain, nausea, vomiting or visible bleeding.  Past Medical History:  Diagnosis Date  . ANXIETY 03/05/2007  . ATAXIA 12/26/2009  . BEE STING 03/05/2007  . BRONCHITIS, ACUTE 06/28/2010  . CANDIDIASIS, VAGINAL 09/08/2008  . CEREBROVASCULAR ACCIDENT, HX OF 01/04/2010  . CERUMEN IMPACTION 06/05/2008  . DIZZINESS 12/26/2009  . Epistaxis 06/05/2008  . Headache(784.0) 06/05/2008  . HYPERLIPIDEMIA 12/08/2006  . HYPERTENSION 12/08/2006  . 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 Multiple myeloma ([place]) 04/03/2015  . NECK PAIN 06/05/2008  . OBESITY 09/07/2009  . ONYCHOMYCOSIS 03/09/2009  . OSTEOARTHRITIS 12/08/2006  . PARESTHESIA 12/26/2009  . TOBACCO USE, QUIT 03/09/2009   Past Surgical History:  Procedure Laterality Date  . ABDOMINAL HYSTERECTOMY    . BIOPSY  11/05/2017   Procedure: BIOPSY;  Surgeon: Yetta Flock, MD;  Location: [facility];  Service: Gastroenterology;;  . BREAST SURGERY    . COLONOSCOPY WITH PROPOFOL N/A 11/05/2017   Procedure: COLONOSCOPY WITH PROPOFOL;  Surgeon: Yetta Flock, MD;  Location: [facility];  Service: Gastroenterology;  Laterality: N/A;  spot tattoo   .  ESOPHAGOGASTRODUODENOSCOPY (EGD) WITH PROPOFOL N/A 11/05/2017   Procedure: ESOPHAGOGASTRODUODENOSCOPY (EGD) WITH PROPOFOL;  Surgeon: Yetta Flock, MD;  Location: [facility];  Service: Gastroenterology;  Laterality: N/A;  APC  . SUBMUCOSAL INJECTION  11/05/2017   Procedure: SUBMUCOSAL INJECTION;  Surgeon: Yetta Flock, MD;  Location: [facility];  Service: Gastroenterology;;      Allergies: Bee venom; Ceftin [cefuroxime axetil]; Other; and Spider antivenin [black widow spider antivenin (l.mactans)]  Medications: Prior to Admission medications   Medication Sig Start Date End Date Taking? Authorizing Provider  amLODipine (NORVASC) 5 MG tablet TAKE 1 TABLET DAILY 06/30/17  Yes Plotnikov, Evie Lacks, MD  Cholecalciferol (VITAMIN D) 1000 UNITS capsule Take 1 capsule (1,000 Units total) by mouth daily. 01/03/11  Yes Plotnikov, Evie Lacks, MD  fish oil-omega-3 fatty acids 1000 MG capsule Take 1 g by mouth daily.     Yes [provider]  HYDROcodone-acetaminophen (NORCO/VICODIN) 5-325 MG tablet Take 0.5-1 tablets by mouth every 6 (six) hours as needed for severe pain. 10/12/15  Yes Plotnikov, Evie Lacks, MD  Multiple Vitamin (MULTIVITAMIN) tablet Take 1 tablet by mouth daily.     Yes [provider]  olmesartan (BENICAR) 40 MG tablet Take 1 tablet (40 mg total) by mouth daily. 09/04/17  Yes Plotnikov, Evie Lacks, MD  pravastatin (PRAVACHOL) 40 MG tablet TAKE 1 TABLET DAILY 06/01/17  Yes Plotnikov, Evie Lacks, MD  TOPROL XL 50 MG 24 hr tablet TAKE 1 TABLET TWICE A DAY 08/31/17  Yes Plotnikov, Evie Lacks, MD  vitamin C (ASCORBIC ACID) 500 MG tablet Take 500 mg by mouth daily.     Yes [provider]  Vital Signs: BP 129/67 (BP Location: Left Arm)   Pulse 98   Temp 98.6 F (37 C) (Oral)   Resp 18   Ht '5\' 7"'$  (1.702 m)   Wt 192 lb (87.1 kg)   SpO2 100%   BMI 30.07 kg/m   Physical Exam awake, alert.  Chest clear to auscultation bilaterally anteriorly.  Heart  with regular rate, occasional ectopy noted.  Abdomen soft, positive bowel sounds, nontender.  Trace lower extremity edema bilaterally.  Right knee joint with tenderness to palpation. Imaging: Dg Knee Right Port  Result Date: 11/10/2017 CLINICAL DATA:  Twisted the right knee, now with diffuse right knee pain EXAM: PORTABLE RIGHT KNEE - 1-2 VIEW COMPARISON:  None. FINDINGS: There is significant tricompartmental degenerative joint disease of the right knee primarily involving the lateral compartment where there is almost complete loss of joint space with sclerosis and spurring. Loss of medial compartment and patellofemoral compartment joint space also is noted with degenerative spurring. No acute fracture is seen. No significant joint effusion is noted. IMPRESSION: Significant tricompartmental degenerative joint disease of the right knee primarily involving the lateral compartment. Electronically Signed   By: Ivar Drape M.D.   On: 11/10/2017 09:40   Dg Abd 2 Views  Result Date: 11/07/2017 CLINICAL DATA:  Loose stools this week.  Constipation? EXAM: ABDOMEN - 2 VIEW COMPARISON:  CT abdomen dated 11/05/2017. FINDINGS: Bowel gas pattern is nonobstructive. Typical amount of stool in the colon. No evidence of soft tissue mass or abnormal fluid collection. No evidence of free intraperitoneal air. Degenerative changes throughout the scoliotic thoracolumbar spine, mild to moderate in degree. No acute or suspicious osseous finding. IMPRESSION: Negative. Nonobstructive bowel gas pattern. No radiographic evidence of constipation. Electronically Signed   By: Franki Cabot M.D.   On: 11/07/2017 15:39    Labs:  CBC: Recent Labs    11/05/17 1651 11/06/17 0416 11/09/17 0334 11/10/17 0725  WBC 4.8 5.9 4.5 5.4  HGB 5.8* 5.9* 6.2* 6.2*  HCT 17.8* 18.5* 19.3* 19.4*  PLT 142* 157 172 162    COAGS: No results for input(s): INR, APTT in the last 8760 hours.  BMP: Recent Labs    05/07/17 1650 09/04/17 1343  11/03/17 1344 11/06/17 0416  NA 135 137 136 135  K 3.3* 3.8 4.3 3.3*  CL 104 106 108 109  CO2 '28 30 26 25  '$ GLUCOSE 108* 100* 104* 102*  BUN 10 23 21* 11  CALCIUM 9.4 9.5 8.8* 8.8*  CREATININE 0.89 1.00 1.23* 1.03*  GFRNONAA 57*  --  38* 47*  GFRAA >60  --  44* 55*    LIVER FUNCTION TESTS: Recent Labs    05/07/17 1650 11/03/17 1703  BILITOT 1.1 0.6  AST 20 19  ALT 14 15  ALKPHOS 71 45  PROT 9.4* 12.0*  ALBUMIN 2.8* 2.8*    Assessment and Plan:  Pt with known history of multiple myeloma (currently not on treatment), CVA 2011, hypertension.  Prior bone marrow biopsy performed in 2016.  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  She also has some right knee pain currently with no fracture on plain film.  She has elevated M spike, IgG level and lambda light chain.  Request received for CT-guided bone marrow biopsy before treatment initiated. Risks and benefits discussed with the patient including, but not limited to bleeding, infection, damage to adjacent structures or low  yield requiring additional tests.  All of the patient's questions were answered, patient is agreeable to proceed. Consent signed and in chart.  Procedure tentatively scheduled for this morning.   Electronically Signed: D. Rowe Robert, PA-C 11/10/2017, 10:27 AM   I spent a total of 20 minutes at the the patient's bedside AND on the patient's hospital floor or unit, greater than 50% of which was counseling/coordinating care for CT-guided bone marrow biopsy    Patient ID: Mindy Taylor, female   DOB: 1929/05/03, 84 y.o.   MRN: [id number]

## 2017-11-10 NOTE — Progress Notes (Signed)
6.  S/W [employer] @ [place] # [phone]    1. ARANESP 150 MCG ONCE WEEKLY FOR 7 DAYS  COVER- YES  CO-PAY- $ 28.00  ( SYRINGE OR VIAL )  TIER- NO  PRIOR APPROVAL- NO    2. ARANESP 300 MCG ONCE WEEKLY FOR 7 DAYS  COVER- YES  CO-PAY- $ 28.00  ( SYRINGE OR VIAL )  TIER- NO  PRIOR APPROVAL- NO    PREFERRED PHARMACY : YES [facility]

## 2017-11-11 DIAGNOSIS — K922 Gastrointestinal hemorrhage, unspecified: Secondary | ICD-10-CM

## 2017-11-11 LAB — CBC WITH DIFFERENTIAL/PLATELET
BASOS ABS: 0 10*3/uL (ref 0.0–0.1)
BASOS PCT: 1 %
Eosinophils Absolute: 0.1 10*3/uL (ref 0.0–0.7)
Eosinophils Relative: 1 %
HEMATOCRIT: 18.8 % — AB (ref 36.0–46.0)
HEMOGLOBIN: 6.1 g/dL — AB (ref 12.0–15.0)
LYMPHS PCT: 34 %
Lymphs Abs: 2.1 10*3/uL (ref 0.7–4.0)
MCH: 33.5 pg (ref 26.0–34.0)
MCHC: 32.4 g/dL (ref 30.0–36.0)
MCV: 103.3 fL — AB (ref 78.0–100.0)
MONO ABS: 0.6 10*3/uL (ref 0.1–1.0)
Monocytes Relative: 9 %
NEUTROS ABS: 3.4 10*3/uL (ref 1.7–7.7)
NEUTROS PCT: 55 %
Platelets: 152 10*3/uL (ref 150–400)
RBC: 1.82 MIL/uL — ABNORMAL LOW (ref 3.87–5.11)
RDW: 17.4 % — ABNORMAL HIGH (ref 11.5–15.5)
WBC: 6.1 10*3/uL (ref 4.0–10.5)

## 2017-11-11 LAB — COMPREHENSIVE METABOLIC PANEL
ALBUMIN: 2.3 g/dL — AB (ref 3.5–5.0)
ALK PHOS: 44 U/L (ref 38–126)
ALT: 11 U/L — AB (ref 14–54)
AST: 14 U/L — AB (ref 15–41)
Anion gap: 1 — ABNORMAL LOW (ref 5–15)
BILIRUBIN TOTAL: 0.9 mg/dL (ref 0.3–1.2)
BUN: 14 mg/dL (ref 6–20)
CALCIUM: 10.1 mg/dL (ref 8.9–10.3)
CO2: 30 mmol/L (ref 22–32)
CREATININE: 1.02 mg/dL — AB (ref 0.44–1.00)
Chloride: 100 mmol/L — ABNORMAL LOW (ref 101–111)
GFR calc Af Amer: 56 mL/min — ABNORMAL LOW (ref >60)
GFR, EST NON AFRICAN AMERICAN: 48 mL/min — AB (ref >60)
GLUCOSE: 110 mg/dL — AB (ref 65–99)
Potassium: 3.9 mmol/L (ref 3.5–5.1)
Sodium: 131 mmol/L — ABNORMAL LOW (ref 135–145)
TOTAL PROTEIN: 11.1 g/dL — AB (ref 6.5–8.1)

## 2017-11-11 MED ORDER — TRAMADOL HCL 50 MG PO TABS
50.0000 mg | ORAL_TABLET | Freq: Three times a day (TID) | ORAL | Status: DC | PRN
  Administered 2017-11-11 – 2017-11-16 (×8): 50 mg via ORAL
  Filled 2017-11-11 (×9): qty 1

## 2017-11-11 MED ORDER — DICLOFENAC SODIUM 1 % TD GEL
2.0000 g | Freq: Four times a day (QID) | TRANSDERMAL | Status: DC
  Administered 2017-11-11 – 2017-11-16 (×19): 2 g via TOPICAL
  Filled 2017-11-11: qty 100

## 2017-11-11 MED ORDER — ACETAMINOPHEN 325 MG PO TABS
650.0000 mg | ORAL_TABLET | Freq: Four times a day (QID) | ORAL | Status: DC | PRN
  Administered 2017-11-11: 650 mg via ORAL
  Filled 2017-11-11: qty 2

## 2017-11-11 NOTE — Progress Notes (Signed)
CSW contacted [facility] SN[facility] admissions staff member Abigail Butts to inquire about ability to offer patient a bed, no answer. CSW left voicemail requesting return phone call.   [facility] responded in the hub and declined to offer patient a bed.  CSW updated patient/patient's niece. Patient's niece requested that CSW follow up with [facility] to see if they have a bed available for patient. CSW inquired about SNF selection from bed offers provided, patient's niece reported that [facility] may be a second option if preference [facility] is not available.  CSW contacted [facility] admissions staff member [place], no answer. CSW left voicemail requesting return phone call.   CSW awaiting return call from [facility] SN[facility] to see if they are able to offer patient a be.  CSW will continue to follow and assist with discharge planning.  Abundio Miu, [place] Social Worker [facility] Cell#: [phone]

## 2017-11-11 NOTE — Progress Notes (Signed)
PROGRESS NOTE    Mindy Taylor  GUR:[id number] DOB: 09/15/1928 DOA: 11/03/2017 PCP: Cassandria Anger, MD    Brief Narrative:  84 year old female who presented with fatigue, dyspnea on exertion, and dizziness. She does have significant past medical history for multiple myeloma, history of CVA, and hypertension. She had persistent symptoms for last 3 weeks, associated with lower extremity edema.  On the initial physical examination temperature 99.5, blood pressure systolic 283, heart rate 70.  Moist mucous membranes, lungs clear to auscultation bilaterally, heart sounds S1-S2, present, rhythmic, the abdomen was soft and nontender, positive lower extremity edema.  Sodium 136, potassium 4.3, chloride 108, bicarb 26, glucose 104, BUN 21, creatinine 1.23, white blood cell count 5.5, hemoglobin 6.4, hematocrit 19.6, platelets 181.   Patient was admitted to the hospital with the working diagnosis of symptomatic anemia.  Assessment & Plan:   Active Problems:   Essential hypertension   Cerebral artery occlusion with cerebral infarction ([place])   Multiple myeloma (HCC)   Anemia   Colonic mass   1. Symptomatic anemia due to multiple myeloma. (Patient Jehovah witness). Patient with persistent symptoms, hb at 6,1, will transfer to 6 floor for multiple myeloma therapy. Will follow with oncology recommendations.   2. Newly diagnosed tubular villous adenoma of the colon. No current abdominal pain, will continue to encourage po intake as tolerated, patient will have multiple myeloma therapy to correct anemia, before addressing colon mass.   3. HTN. Continue blood pressure control with irbesartan and as needed hydralazine.   4. Dyslipidemia. Continue pravastatin   5. Right knee pain. Will add topical voltaren, continue physical therapy as tolerated.    DVT prophylaxis: scd  Code Status: dnr  Family Communication: no family at the bedside   Disposition Plan: transfer to 6 floor for chemotherapy     Consultants:   Oncology   Procedures:     Antimicrobials:       Subjective: Patient with stable dyspnea, worse with exertion, positive right knee pain, moderate in intensity, worse with movement, no nausea or vomiting.   Objective: Vitals:   11/10/17 1326 11/10/17 2132 11/10/17 2133 11/11/17 0606  BP: (!) 150/80  (!) 159/92 111/62  Pulse: (!) 101 (!) 108 (!) 108 97  Resp: _0 Temp: 99.1 F (37.3 C)  98.7 F (37.1 C) 98.8 F (37.1 C)  TempSrc: Oral  Oral Oral  SpO2: 99% 96% 96% 97%  Weight:      Height:        Intake/Output Summary (Last 24 hours) at 11/11/2017 0832 Last data filed at 11/10/2017 2134 Gross per 24 hour  Intake 720 ml  Output 600 ml  Net 120 ml   Filed Weights   11/03/17 1247 11/03/17 2337 11/05/17 1020  Weight: 88.5 kg (195 lb) 87.5 kg (192 lb 12.8 oz) 87.1 kg (192 lb)    Examination:   General: deconditioned and ill looking appearing  Neurology: Awake and alert, non focal  E ENT: positive pallor, no icterus, oral mucosa moist Cardiovascular: No JVD. S1-S2 present, rhythmic, no gallops, rubs, or murmurs. No lower extremity edema. Pulmonary: decreased breath sounds bilaterally, adequate air movement, no wheezing, rhonchi or rales. Gastrointestinal. Abdomen with no organomegaly, non tender, no rebound or guarding Skin. No rashes Musculoskeletal: no joint deformities     Data Reviewed: I have personally reviewed following labs and imaging studies  CBC: Recent Labs  Lab 11/05/17 1651 11/06/17 0416 11/09/17 0334 11/10/17 0725 11/11/17 0707  WBC 4.8 5.9  4.5 5.4 6.1  NEUTROABS 2.3 3.2  --  2.9 3.4  HGB 5.8* 5.9* 6.2* 6.2* 6.1*  HCT 17.8* 18.5* 19.3* 19.4* 18.8*  MCV 102.3* 102.2* 103.8* 103.7* 103.3*  PLT 142* 157 172 162 481   Basic Metabolic Panel: Recent Labs  Lab 11/06/17 0416 11/11/17 0707  NA 135 131*  K 3.3* 3.9  CL 109 100*  CO2 25 30  GLUCOSE 102* 110*  BUN 11 14  CREATININE 1.03* 1.02*  CALCIUM 8.8*  10.1   GFR: Estimated Creatinine Clearance: 44 mL/min (A) (by C-G formula based on SCr of 1.02 mg/dL (H)). Liver Function Tests: Recent Labs  Lab 11/11/17 0707  AST 14*  ALT 11*  ALKPHOS 44  BILITOT 0.9  PROT 11.1*  ALBUMIN 2.3*   No results for input(s): LIPASE, AMYLASE in the last 168 hours. No results for input(s): AMMONIA in the last 168 hours. Coagulation Profile: No results for input(s): INR, PROTIME in the last 168 hours. Cardiac Enzymes: No results for input(s): CKTOTAL, CKMB, CKMBINDEX, TROPONINI in the last 168 hours. BNP (last 3 results) No results for input(s): PROBNP in the last 8760 hours. HbA1C: No results for input(s): HGBA1C in the last 72 hours. CBG: No results for input(s): GLUCAP in the last 168 hours. Lipid Profile: No results for input(s): CHOL, HDL, LDLCALC, TRIG, CHOLHDL, LDLDIRECT in the last 72 hours. Thyroid Function Tests: No results for input(s): TSH, T4TOTAL, FREET4, T3FREE, THYROIDAB in the last 72 hours. Anemia Panel: No results for input(s): VITAMINB12, FOLATE, FERRITIN, TIBC, IRON, RETICCTPCT in the last 72 hours.    Radiology Studies: I have reviewed all of the imaging during this hospital visit personally     Scheduled Meds: . epoetin alfa  20,000 Units Subcutaneous Daily  . feeding supplement (ENSURE ENLIVE)  237 mL Oral BID BM  . irbesartan  150 mg Oral Daily  . pantoprazole  40 mg Oral BID  . pravastatin  40 mg Oral q1800  . traZODone  50 mg Oral QHS   Continuous Infusions:   LOS: 7 days        Quatisha Zylka Gerome Apley, MD [facility] Pager [phone]

## 2017-11-11 NOTE — Progress Notes (Signed)
Transferred pt to room 1611. Report given to Nurse Maggie. Condition unchanged. Eulas Post, RN

## 2017-11-11 NOTE — Progress Notes (Signed)
Ms. Hice actually had a bone marrow biopsy done yesterday.  I am absolutely impressed as to how quickly radiology was to do this for [place].  I am so thankful for this.  She is had no obvious bleeding.  She does feel tired.  Her hemoglobin is 6.1.  She is still getting the daily Procrit.  She has gotten iron.  Her white cell count is 6.1.  Her platelet count is 152.  Her creatinine is 1.02.  Her calcium is 10.1 with an albumin of 2.3.  I am not sure what the plans are for her to go to skilled nursing.  Hopefully, she will be able to go soon.  In speaking with her family yesterday, it seems as if they want her to take treatment for the myeloma.  This we can certainly do.  I would utilize Kyprolis/Cytoxan.  I think this would be a good combination for her.  I would suspect that the response rate should be 90% if not greater.  I think the one issues is whether or not we need to start this with her as an inpatient.  I would have no problems doing this.  She would had to be moved up to the sixth floor for this.  Again, a lot will depend on when she is deemed ready to go to skilled nursing.  Her appetite is marginal.  She has had no nausea or vomiting.  There is been no pain.  Her vital signs all look pretty stable.  Again, if we can start treatment as an inpatient, this certainly would be reasonable.  Again Kyprolis/Cytoxan is once a week.  This would be very easy to do as an inpatient.  We could justify this as an inpatient therapy because of her status is Jehovah's Witness and that she is quite anemic and that the myeloma is crowding out her bone marrow so she cannot make red cells.  I appreciate everybody's help up on 4 W.  Lattie Haw, MD  Oswaldo Milian 41:10

## 2017-11-11 NOTE — Progress Notes (Signed)
Physical Therapy Treatment Patient Details Name: Mindy Taylor MRN: [id number] DOB: 1929-04-10 Today's Date: 11/11/2017    History of Present Illness Jamirra Curnow Cuccia is a 84 y.o. female with medical history significant for multiple myeloma, CVA, HTN, who presented to the ED with complaints of fatigue, shortness of breath with exertion, and dizziness, of 3 weeks duration.  GI consulted in ED d/t blood in rectum, mass questionable for adeno carcinoma;  s/p bone marrow bx/aspiration on 11/10/17; pt is now amenable to treatment; R knee xray =Significant tricompartmental degenerative joint disease of the right        PT Comments    tx limited this date d/t significant right knee pain over medial and lateral aspects, tender to light touch; Voltaren gel applied after nursing measured dose/RN present; light  Knee massage  To decr edema--tolerated well; pt encouraged to continue work on R knee extension/ROM throughout day; pt is requiring +2 assist at this time d/t knee pain  Follow Up Recommendations  SNF     Equipment Recommendations  None recommended by PT    Recommendations for Other Services       Precautions / Restrictions Precautions Precautions: Fall Precaution Comments: right knee is painful to touch, edmeatous over  anterior knee Restrictions Weight Bearing Restrictions: No    Mobility  Bed Mobility               General bed mobility comments: NT d/t right knee pain--pt declined  Transfers                    Ambulation/Gait                 Stairs             Wheelchair Mobility    Modified Rankin (Stroke Patients Only)       Balance                                            Cognition Arousal/Alertness: Awake/alert Behavior During Therapy: WFL for tasks assessed/performed Overall Cognitive Status: Within Functional Limits for tasks assessed                                        Exercises General  Exercises - Lower Extremity Ankle Circles/Pumps: AROM;Both;10 reps;Limitations Ankle Circles/Pumps Limitations: limited ROM on right d/t knee pain with ankle movement Quad Sets: Strengthening;Left;10 reps Heel Slides: AROM;Left;10 reps Other Exercises Other Exercises: light traction to right LE to allow decr muscle guarding and initiation of knee extension Other Exercises: gentle PROM right knee ~6* ROM, very limited d/t pain    General Comments General comments (skin integrity, edema, etc.): pt has right knee elevated on pillos in at least 82* flexion      Pertinent Vitals/Pain Pain Assessment: Faces Faces Pain Scale: Hurts whole lot Pain Location: right knee Pain Descriptors / Indicators: Discomfort;Grimacing;Guarding;Sore;Tightness Pain Intervention(s): Monitored during session;Patient requesting pain meds-RN notified;Ice applied    Home Living                      Prior Function            PT Goals (current goals can now be found in the care plan section) Acute Rehab PT Goals Patient Stated  Goal: go to rehab to get stronger before going home to live alone again. PT Goal Formulation: With patient/family Time For Goal Achievement: 11/16/17 Potential to Achieve Goals: Fair Progress towards PT goals: Not progressing toward goals - comment(d/t right knee pain)    Frequency    Min 2X/week      PT Plan Current plan remains appropriate    Co-evaluation              AM-PAC PT "6 Clicks" Daily Activity  Outcome Measure  Difficulty turning over in bed (including adjusting bedclothes, sheets and blankets)?: Unable Difficulty moving from lying on back to sitting on the side of the bed? : Unable Difficulty sitting down on and standing up from a chair with arms (e.g., wheelchair, bedside commode, etc,.)?: Unable Help needed moving to and from a bed to chair (including a wheelchair)?: Total Help needed walking in hospital room?: Total Help needed climbing 3-5  steps with a railing? : Total 6 Click Score: 6    End of Session   Activity Tolerance: Patient limited by pain Patient left: in bed;with call bell/phone within reach;with family/visitor present   PT Visit Diagnosis: Unsteadiness on feet (R26.81);Difficulty in walking, not elsewhere classified (R26.2);Muscle weakness (generalized) (M62.81);Pain Pain - Right/Left: Right Pain - part of body: Knee     Time: 3299-2426 PT Time Calculation (min) (ACUTE ONLY): 24 min  Charges:  $Therapeutic Exercise: 23-37 mins                    G CodesKenyon Ana, PT Pager: [phone] 11/11/2017    Kenyon Ana 11/11/2017, 12:16 PM

## 2017-11-12 ENCOUNTER — Encounter (HOSPITAL_COMMUNITY): Payer: Self-pay | Admitting: Hematology & Oncology

## 2017-11-12 DIAGNOSIS — Z7189 Other specified counseling: Secondary | ICD-10-CM

## 2017-11-12 DIAGNOSIS — G8929 Other chronic pain: Secondary | ICD-10-CM

## 2017-11-12 DIAGNOSIS — C9002 Multiple myeloma in relapse: Secondary | ICD-10-CM

## 2017-11-12 HISTORY — DX: Other specified counseling: Z71.89

## 2017-11-12 LAB — CBC WITH DIFFERENTIAL/PLATELET
Basophils Absolute: 0 10*3/uL (ref 0.0–0.1)
Basophils Relative: 1 %
EOS PCT: 2 %
Eosinophils Absolute: 0.1 10*3/uL (ref 0.0–0.7)
HCT: 19.3 % — ABNORMAL LOW (ref 36.0–46.0)
HEMOGLOBIN: 6.1 g/dL — AB (ref 12.0–15.0)
LYMPHS ABS: 2.2 10*3/uL (ref 0.7–4.0)
LYMPHS PCT: 35 %
MCH: 32.8 pg (ref 26.0–34.0)
MCHC: 31.6 g/dL (ref 30.0–36.0)
MCV: 103.8 fL — AB (ref 78.0–100.0)
Monocytes Absolute: 0.6 10*3/uL (ref 0.1–1.0)
Monocytes Relative: 9 %
NEUTROS ABS: 3.4 10*3/uL (ref 1.7–7.7)
NEUTROS PCT: 53 %
PLATELETS: 180 10*3/uL (ref 150–400)
RBC: 1.86 MIL/uL — AB (ref 3.87–5.11)
RDW: 17.7 % — ABNORMAL HIGH (ref 11.5–15.5)
WBC: 6.4 10*3/uL (ref 4.0–10.5)

## 2017-11-12 LAB — COMPREHENSIVE METABOLIC PANEL
ALT: 10 U/L — AB (ref 14–54)
AST: 12 U/L — AB (ref 15–41)
Albumin: 2.3 g/dL — ABNORMAL LOW (ref 3.5–5.0)
Alkaline Phosphatase: 45 U/L (ref 38–126)
Anion gap: 1 — ABNORMAL LOW (ref 5–15)
BUN: 20 mg/dL (ref 6–20)
CHLORIDE: 99 mmol/L — AB (ref 101–111)
CO2: 29 mmol/L (ref 22–32)
CREATININE: 1.29 mg/dL — AB (ref 0.44–1.00)
Calcium: 11 mg/dL — ABNORMAL HIGH (ref 8.9–10.3)
GFR calc Af Amer: 42 mL/min — ABNORMAL LOW (ref >60)
GFR, EST NON AFRICAN AMERICAN: 36 mL/min — AB (ref >60)
Glucose, Bld: 112 mg/dL — ABNORMAL HIGH (ref 65–99)
Potassium: 4.2 mmol/L (ref 3.5–5.1)
SODIUM: 129 mmol/L — AB (ref 135–145)
Total Bilirubin: 1.2 mg/dL (ref 0.3–1.2)
Total Protein: 11.4 g/dL — ABNORMAL HIGH (ref 6.5–8.1)

## 2017-11-12 LAB — HEMOGLOBIN AND HEMATOCRIT, BLOOD
HEMATOCRIT: 19.2 % — AB (ref 36.0–46.0)
HEMOGLOBIN: 6.2 g/dL — AB (ref 12.0–15.0)

## 2017-11-12 MED ORDER — ZOLEDRONIC ACID 4 MG/5ML IV CONC
4.0000 mg | Freq: Once | INTRAVENOUS | Status: AC
  Administered 2017-11-12: 4 mg via INTRAVENOUS
  Filled 2017-11-12: qty 5

## 2017-11-12 MED ORDER — FAMCICLOVIR 500 MG PO TABS
500.0000 mg | ORAL_TABLET | Freq: Every day | ORAL | Status: DC
  Administered 2017-11-12 – 2017-11-16 (×4): 500 mg via ORAL
  Filled 2017-11-12 (×5): qty 1

## 2017-11-12 NOTE — Progress Notes (Addendum)
Mindy Taylor is now up on the sixth floor.  I have believe that she was moved up to the sixth floor to see about starting therapy for the myeloma.  I have no problems with this.  I think Kyprolis/Cytoxan would be a very good option for her.  Her calcium is starting to creep up.  I think we have to fix this.  I will give her a dose of Zometa.  Her hemoglobin is stabilizing at 6.1.  She still on the Procrit injections.  Her creatinine is 1.29.  Her corrected calcium is 11.7.  Her sodium is 129.  This might be slightly low because of the paraprotein from her myeloma.  I talked to her about treatment.  We do not yet have a bone marrow biopsy back yet.  I have to believe that the marrow biopsy will show extensive myelomatous replacement.  The real key is going to be the cytogenetic studies.  That will take about a week to come back.  I think that the Kyprolis/Cytoxan protocol would be helpful.  It would be 3 weeks on and one-week off.  I am not sure when she will be going to rehab.  This would certainly be a reasonable option for her also.  Her physical exam shows a temperature of 97.8.  Pulse 95.  Blood pressure 125/63.  Her lungs sound relatively clear bilaterally.  Oral exam shows no thrush.  There is no adenopathy on her neck.  Cardiac exam irregular rate.  This might be atrial fibrillation.  Abdomen is soft.  She has good bowel sounds.  She is mildly obese.  There is no fluid wave.  Extremities shows no clubbing, cyanosis or edema.  Mindy Taylor has relapsed myeloma.  This is obvious by her myeloma studies.  She now wants to get better.  As such, we are going to have to start treatment as an inpatient.  We really have to improve her hemoglobin for her to feel better.  I think if we got treatment started with her in the hospital, this would help the process.  Again, will see about getting treatment started.  If I will get started until tomorrow.  I would think that 3 weeks on and one-week off  protocol would be reasonable.  I would be easy on the "ramp up" of Kyprolis.  She is agreeable to chemotherapy.  She understands chemotherapy.  She understands the side effects.  She agrees to proceed.  She understands our goal of care is clearly to improve her blood counts.  We are not going to cure the myeloma at all.  But if we can at least get her blood counts better, she will feel better.  Lattie Haw, MD  Darlyn Chamber 33:6  ADDENDUM: I talked to her sister this morning.  The family has decided NOT to go with chemotherapy.  I certainly would have no problems with this.  The bone marrow biopsy result will be very interesting.  I think if there is complete infiltration by myeloma in the bone marrow, I am not sure that appropriate will help.  I would stop the Procrit if this is found in the bone marrow.  I told her sister that I suspected that her slight change in mental state is because of her calcium starting to elevate.  This also is from her myeloma.  We will give her Zometa to try to help with this.  Her sister understands completely that the myeloma is going to be the main issue  at this point.  If they do not want Mindy Taylor to have any treatment for the myeloma, then it would not surprise me if Mindy Taylor will not make it more than a month.  We are doing our best to try to help Mindy Taylor.  However, her family I think has their own opinions and recommendations.  We will have to abide by them and respect their decisions.  Lattie Haw, MD

## 2017-11-12 NOTE — Progress Notes (Signed)
PROGRESS NOTE    Mindy Taylor  PQD:[id number] DOB: May 20, 1929 DOA: 11/03/2017 PCP: Cassandria Anger, MD    Brief Narrative:  84 year old female who presented with fatigue, dyspnea on exertion, and dizziness. She does have significant past medical history for multiple myeloma, history of CVA, and hypertension. She had persistent symptoms for last 3 weeks, associated with lower extremity edema.  On the initial physical examination temperature 99.5, blood pressure systolic 940, heart rate 70.  Moist mucous membranes, lungs clear to auscultation bilaterally, heart sounds S1-S2, present, rhythmic, the abdomen was soft and nontender, positive lower extremity edema.  Sodium 136, potassium 4.3, chloride 108, bicarb 26, glucose 104, BUN 21, creatinine 1.23, white blood cell count 5.5, hemoglobin 6.4, hematocrit 19.6, platelets 181.   Urine analysis specific gravity 1.0 13, 0-5 white cells.  Head and neck CT angiography negative for acute changes.  CT of the abdomen with biliary sludge in the gallbladder.  EKG with normal sinus rhythm, normal intervals, normal axis, poor R wave progression.  Patient was admitted to the hospital with the working diagnosis of acute symptomatic anemia   Assessment & Plan:   Active Problems:   Essential hypertension   Cerebral artery occlusion with cerebral infarction ([place])   Multiple myeloma (HCC)   Anemia   Colonic mass   Goals of care, counseling/discussion   1. Symptomatic anemia due to multiple myeloma. (Patient Jehovah witness). Complicated with hypercalcemia. Stable hb at 6,1, no signs of active bleeding, patient will be treated for multiple myeloma to improve anemia and functional status. Follow with oncology recommendations. Patient currently with persistent symptoms. Serum ca now up to 11.0, patient will receive one dose of zolendronic acid. One episode of fever, T max 101.2, no signs of systemic infection, will continue to follow temperature curve.  Continue epoetin.    2. Newly diagnosed tubular villous adenoma of the colon. Therapeutic options include endoscopic removal of the lesion, but it does have a high risk of bleeding and perforation. With current anemia, not a good candidate for surgical intervention. Patient will be treated for multiple myeloma to optimized anemia before considering any surgical intervention.   3. HTN. On irbesartan for blood pressure control, systolic blood pressure 768 to 125 mmHg.   4. Dyslipidemia. Continue pravastatin with good toleration.   5. Right knee pain. Improved pain with topical voltaren, continue physical therapy as tolerated. Knee radiograph with significant tricompartmental degenerative joint disease.    DVT prophylaxis: scd  Code Status: dnr  Family Communication: no family at the bedside   Disposition Plan: transfer to 6 floor for chemotherapy    Consultants:   Oncology   Procedures:     Antimicrobials    Subjective: Right knee pain improved with analgesics, persistent fatigue and malaise, no nausea or vomiting, no chest pain.   Objective: Vitals:   11/11/17 1550 11/11/17 2135 11/11/17 2358 11/12/17 0606  BP: (!) 100/58 120/63  125/63  Pulse: (!) 103 (!) 104  95  Resp: _0 Temp: 99.6 F (37.6 C) (!) 101.2 F (38.4 C) 99.4 F (37.4 C) 97.8 F (36.6 C)  TempSrc: Oral Axillary Oral Oral  SpO2: 95% 100%  98%  Weight: 87.5 kg (193 lb)     Height:        Intake/Output Summary (Last 24 hours) at 11/12/2017 0852 Last data filed at 11/12/2017 0881 Gross per 24 hour  Intake -  Output 500 ml  Net -500 ml   [employer]  11/03/17 2337 11/05/17 1020 11/11/17 1550  Weight: 87.5 kg (192 lb 12.8 oz) 87.1 kg (192 lb) 87.5 kg (193 lb)    Examination:   General: deconditioned and ill looking appearing Neurology: Awake and alert, non focal  E ENT: positive pallor, but no icterus, oral mucosa moist Cardiovascular: No JVD. S1-S2 present, rhythmic, no  gallops, rubs, or murmurs. No lower extremity edema. Pulmonary: decreased breath sounds bilaterally, adequate air movement, no wheezing, rhonchi or rales. Gastrointestinal. Abdomen with no organomegaly, non tender, no rebound or guarding Skin. No rashes Musculoskeletal: no joint deformities. Significant hypertrophy on bilateral knees, more right than left, with decreased range of motion.      Data Reviewed: I have personally reviewed following labs and imaging studies  CBC: Recent Labs  Lab 11/05/17 1651 11/06/17 0416 11/09/17 0334 11/10/17 0725 11/11/17 0707 11/12/17 0340  WBC 4.8 5.9 4.5 5.4 6.1 6.4  NEUTROABS 2.3 3.2  --  2.9 3.4 3.4  HGB 5.8* 5.9* 6.2* 6.2* 6.1* 6.1*  HCT 17.8* 18.5* 19.3* 19.4* 18.8* 19.3*  MCV 102.3* 102.2* 103.8* 103.7* 103.3* 103.8*  PLT 142* 157 172 162 152 400   Basic Metabolic Panel: Recent Labs  Lab 11/06/17 0416 11/11/17 0707 11/12/17 0340  NA 135 131* 129*  K 3.3* 3.9 4.2  CL 109 100* 99*  CO2 _0 GLUCOSE 102* 110* 112*  BUN _1 CREATININE 1.03* 1.02* 1.29*  CALCIUM 8.8* 10.1 11.0*   GFR: Estimated Creatinine Clearance: 34.9 mL/min (A) (by C-G formula based on SCr of 1.29 mg/dL (H)). Liver Function Tests: Recent Labs  Lab 11/11/17 0707 11/12/17 0340  AST 14* 12*  ALT 11* 10*  ALKPHOS 44 45  BILITOT 0.9 1.2  PROT 11.1* 11.4*  ALBUMIN 2.3* 2.3*   No results for input(s): LIPASE, AMYLASE in the last 168 hours. No results for input(s): AMMONIA in the last 168 hours. Coagulation Profile: No results for input(s): INR, PROTIME in the last 168 hours. Cardiac Enzymes: No results for input(s): CKTOTAL, CKMB, CKMBINDEX, TROPONINI in the last 168 hours. BNP (last 3 results) No results for input(s): PROBNP in the last 8760 hours. HbA1C: No results for input(s): HGBA1C in the last 72 hours. CBG: No results for input(s): GLUCAP in the last 168 hours. Lipid Profile: No results for input(s): CHOL, HDL, LDLCALC, TRIG,  CHOLHDL, LDLDIRECT in the last 72 hours. Thyroid Function Tests: No results for input(s): TSH, T4TOTAL, FREET4, T3FREE, THYROIDAB in the last 72 hours. Anemia Panel: No results for input(s): VITAMINB12, FOLATE, FERRITIN, TIBC, IRON, RETICCTPCT in the last 72 hours.    Radiology Studies: I have reviewed all of the imaging during this hospital visit personally     Scheduled Meds: . diclofenac sodium  2 g Topical QID  . epoetin alfa  20,000 Units Subcutaneous Daily  . famciclovir  500 mg Oral Daily  . feeding supplement (ENSURE ENLIVE)  237 mL Oral BID BM  . irbesartan  150 mg Oral Daily  . pantoprazole  40 mg Oral BID  . pravastatin  40 mg Oral q1800  . traZODone  50 mg Oral QHS   Continuous Infusions: . zoledronic acid (ZOMETA) IV       LOS: 8 days        Mauricio Gerome Apley, MD [facility] Pager [phone]

## 2017-11-12 NOTE — Progress Notes (Signed)
CRITICAL VALUE ALERT  Critical Value: HGB- 6.1  Date & Time Notied:  11/12/17 - 4045  Provider Notified: [id number]  Orders Received/Actions taken:

## 2017-11-13 DIAGNOSIS — N179 Acute kidney failure, unspecified: Secondary | ICD-10-CM

## 2017-11-13 LAB — COMPREHENSIVE METABOLIC PANEL
ALK PHOS: 47 U/L (ref 38–126)
ALT: 11 U/L — ABNORMAL LOW (ref 14–54)
AST: 13 U/L — ABNORMAL LOW (ref 15–41)
Albumin: 2.2 g/dL — ABNORMAL LOW (ref 3.5–5.0)
BILIRUBIN TOTAL: 1.1 mg/dL (ref 0.3–1.2)
BUN: 22 mg/dL — ABNORMAL HIGH (ref 6–20)
CALCIUM: 11.1 mg/dL — AB (ref 8.9–10.3)
CO2: 29 mmol/L (ref 22–32)
Chloride: 99 mmol/L — ABNORMAL LOW (ref 101–111)
Creatinine, Ser: 1.39 mg/dL — ABNORMAL HIGH (ref 0.44–1.00)
GFR, EST AFRICAN AMERICAN: 38 mL/min — AB (ref >60)
GFR, EST NON AFRICAN AMERICAN: 33 mL/min — AB (ref >60)
Glucose, Bld: 114 mg/dL — ABNORMAL HIGH (ref 65–99)
POTASSIUM: 4 mmol/L (ref 3.5–5.1)
Sodium: 129 mmol/L — ABNORMAL LOW (ref 135–145)
Total Protein: 11.6 g/dL — ABNORMAL HIGH (ref 6.5–8.1)

## 2017-11-13 MED ORDER — TRAMADOL HCL 50 MG PO TABS
50.0000 mg | ORAL_TABLET | Freq: Once | ORAL | Status: AC
  Administered 2017-11-13: 50 mg via ORAL
  Filled 2017-11-13: qty 1

## 2017-11-13 MED ORDER — CALCITONIN (SALMON) 200 UNIT/ML IJ SOLN
6.0000 [IU]/kg | Freq: Once | INTRAMUSCULAR | Status: DC
  Filled 2017-11-13: qty 2.63

## 2017-11-13 MED ORDER — HALOPERIDOL 0.5 MG PO TABS
1.0000 mg | ORAL_TABLET | ORAL | Status: DC | PRN
  Administered 2017-11-13: 1 mg via ORAL
  Filled 2017-11-13: qty 2

## 2017-11-13 MED ORDER — LIP MEDEX EX OINT
TOPICAL_OINTMENT | CUTANEOUS | Status: AC
  Administered 2017-11-13: 18:00:00
  Filled 2017-11-13: qty 7

## 2017-11-13 MED ORDER — DEXTROSE IN LACTATED RINGERS 5 % IV SOLN: INTRAVENOUS | Status: DC

## 2017-11-13 MED ORDER — HALOPERIDOL LACTATE 5 MG/ML IJ SOLN: 1.0000 mg | INTRAMUSCULAR | Status: DC | PRN

## 2017-11-13 NOTE — Progress Notes (Signed)
Unfortunately, her family does not want her to have any chemotherapy.  I talked to her sister on the phone yesterday.  They say that she is not able to make a decision herself.  As such, they have decided that chemotherapy would not be to her benefit.  I tried to tell her that I suspect that the myeloma is over taking her bone marrow.  Because of this, I do not think she is able to make red blood cells.  As such, I am not sure how much the appropriate is really going to be able to help.  Her myeloma I think is becoming more of an issue.  She has some hypercalcemia.  I gave her a dose of Zometa yesterday.  Her calcium today is still stable at 11.1.  Her albumin is 2.2.  She is definitely not that responsive.  She has her eyes open.  She really does not answer questions.  I will try some calcitonin to see if this can bring her calcium down a little bit.  Unfortunately, her bone marrow biopsy is still not back yet.  I really think that where the point that we should seriously consider getting hospice.  I really cannot help [name] if her family will not let us give chemotherapy.  I probably would just continue the appropriate for right now until we have the results of the bone marrow biopsy back.  If things continue as they are, I would be shocked if she made it through July.  Her physical exam is pretty much unchanged from yesterday.  Her vital signs all look okay.  Her temperature is 98.2.  Pulse 97.  Blood pressure 127/56.  Her lungs sound relatively clear.  Cardiac exam regular rate and rhythm.  I really do not hear any atrial fibrillation.  Abdomen is soft.  She is slightly obese.  There is no fluid wave.  Again, I think that [name] has rapidly recurrent myeloma.  I would be surprised if her bone marrow showed anything other than marrow replacement by myeloma.  Her family clearly is dictating her care interventions.  If they do not feel she would benefit from treatment I will respect  that.  I will try some calcitonin to help bring the calcium down a little bit.  Mindy Haw, MD  2 Timothy 4:16-18

## 2017-11-13 NOTE — Progress Notes (Signed)
Physical Therapy Treatment Patient Details Name: BAYLYNN SHIFFLETT MRN: [id number] DOB: 01-29-1929 Today's Date: 11/13/2017    History of Present Illness Leatta Alewine Franks is a 84 y.o. female with medical history significant for multiple myeloma, CVA, HTN, who presented to the ED with complaints of fatigue, shortness of breath with exertion, and dizziness, of 3 weeks duration.  GI consulted in ED d/t blood in rectum, mass questionable for adeno carcinoma;  s/p bone marrow bx/aspiration on 11/10/17; pt is now amenable to treatment; R knee xray =Significant tricompartmental degenerative joint disease of the right        PT Comments    Attempted supine to sit, however pt had severe R knee pain with minimal movement and was unable to tolerate activity. Requested pain meds from RN and will attempt again later.   Follow Up Recommendations  SNF;Supervision for mobility/OOB     Equipment Recommendations  Other (comment)(to be determined at next venue of care)    Recommendations for Other Services       Precautions / Restrictions Precautions Precautions: Fall Precaution Comments: right knee is painful to touch, edematous over  anterior knee Restrictions Weight Bearing Restrictions: No    Mobility  Bed Mobility               General bed mobility comments: NT d/t right knee pain--pt unable to tolerate movement; attempted supine to sit but pt yelled out in pain with minimal movement of RLE, pain meds requested  Transfers                    Ambulation/Gait                 Stairs             Wheelchair Mobility    Modified Rankin (Stroke Patients Only)       Balance                                            Cognition Arousal/Alertness: Awake/alert Behavior During Therapy: WFL for tasks assessed/performed Overall Cognitive Status: Within Functional Limits for tasks assessed                                         Exercises General Exercises - Lower Extremity Ankle Circles/Pumps: AAROM;Both;10 reps;Supine Heel Slides: (unable to tolerate) Hip ABduction/ADduction: (unable to tolerate)    General Comments        Pertinent Vitals/Pain Faces Pain Scale: Hurts worst Pain Location: right knee Pain Descriptors / Indicators: Discomfort;Grimacing;Guarding;Sore;Tightness Pain Intervention(s): Limited activity within patient's tolerance;Monitored during session;Patient requesting pain meds-RN notified;Ice applied    Home Living                      Prior Function            PT Goals (current goals can now be found in the care plan section) Acute Rehab PT Goals Patient Stated Goal: go to rehab to get stronger before going home to live alone again. PT Goal Formulation: With patient/family Time For Goal Achievement: 11/16/17 Potential to Achieve Goals: Fair Progress towards PT goals: Not progressing toward goals - comment(R knee pain limiting activity tolerance)    Frequency    Min 2X/week  PT Plan Current plan remains appropriate    Co-evaluation              AM-PAC PT "6 Clicks" Daily Activity  Outcome Measure  Difficulty turning over in bed (including adjusting bedclothes, sheets and blankets)?: Unable Difficulty moving from lying on back to sitting on the side of the bed? : Unable Difficulty sitting down on and standing up from a chair with arms (e.g., wheelchair, bedside commode, etc,.)?: Unable Help needed moving to and from a bed to chair (including a wheelchair)?: Total Help needed walking in hospital room?: Total Help needed climbing 3-5 steps with a railing? : Total 6 Click Score: 6    End of Session   Activity Tolerance: Patient limited by pain Patient left: with family/visitor present;with call bell/phone within reach;in bed Nurse Communication: Mobility status PT Visit Diagnosis: Unsteadiness on feet (R26.81);Difficulty in walking, not elsewhere  classified (R26.2);Muscle weakness (generalized) (M62.81)     Time: 9507-2257 PT Time Calculation (min) (ACUTE ONLY): 15 min  Charges:  $Therapeutic Activity: 8-22 mins                    G Codes:          Philomena Doheny 11/13/2017, 2:39 PM [phone]

## 2017-11-13 NOTE — Progress Notes (Signed)
Pt very agitated this morning. Pt yelling out, "I need to go home now!" Refusing all medications and medical interventions. MD paged. Pt's family in room with pt. Pt's POA Jalynn called and updated as well.

## 2017-11-13 NOTE — Progress Notes (Addendum)
Physical Therapy Treatment Patient Details Name: Mindy Taylor MRN: [id number] DOB: 1928-06-10 Today's Date: 11/13/2017    History of Present Illness Mindy Taylor is a 84 y.o. female with medical history significant for multiple myeloma, CVA, HTN, who presented to the ED with complaints of fatigue, shortness of breath with exertion, and dizziness, of 3 weeks duration.  GI consulted in ED d/t blood in rectum, mass questionable for adeno carcinoma;  s/p bone marrow bx/aspiration on 11/10/17; pt is now amenable to treatment; R knee xray =Significant tricompartmental degenerative joint disease of the right        PT Comments    Pt received pain medication prior to PT session but still was unable to tolerate movement of RLE due to severe pain. She is unable to sit or tolerate exercises to RLE. This is significant decline since PT session on 11/09/17 when she ambulated 40'. Pt's family reports pt had an injury to R knee when pt was assisted back in to bed here in the hospital a couple days ago. Noted xray showed arthritis R knee. Consider MRI of R knee due to significant decline in mobility.     Follow Up Recommendations  SNF;Supervision for mobility/OOB     Equipment Recommendations  Other (comment)(to be determined at next venue of care)    Recommendations for Other Services       Precautions / Restrictions Precautions Precautions: Fall Precaution Comments: right knee is painful to touch, edematous over  anterior knee Restrictions Weight Bearing Restrictions: No    Mobility  Bed Mobility               General bed mobility comments: NT d/t right knee pain--pt unable to tolerate movement; attempted supine to sit but pt yelled out in pain with minimal movement of RLE  Transfers                    Ambulation/Gait                 Stairs             Wheelchair Mobility    Modified Rankin (Stroke Patients Only)       Balance                                             Cognition Arousal/Alertness: Awake/alert Behavior During Therapy: WFL for tasks assessed/performed Overall Cognitive Status: Within Functional Limits for tasks assessed                                        Exercises General Exercises - Upper Extremity Shoulder Flexion: AAROM;Left;5 reps;Supine(pain with R shoulder flexion) General Exercises - Lower Extremity Ankle Circles/Pumps: AAROM;Both;10 reps;Supine Heel Slides: (unable to tolerate) Hip ABduction/ADduction: (unable to tolerate)    General Comments        Pertinent Vitals/Pain Faces Pain Scale: Hurts worst Pain Location: right knee Pain Descriptors / Indicators: Discomfort;Grimacing;Guarding;Sore;Tightness Pain Intervention(s): Premedicated before session;Ice applied;Repositioned    Home Living                      Prior Function            PT Goals (current goals can now be found in the care plan section) Acute  Rehab PT Goals Patient Stated Goal: go to rehab to get stronger before going home to live alone again. PT Goal Formulation: With patient/family Time For Goal Achievement: 11/16/17 Potential to Achieve Goals: Fair Progress towards PT goals: Not progressing toward goals - comment(severe R knee pain with minimal movement)    Frequency    Min 2X/week      PT Plan Current plan remains appropriate    Co-evaluation              AM-PAC PT "6 Clicks" Daily Activity  Outcome Measure  Difficulty turning over in bed (including adjusting bedclothes, sheets and blankets)?: Unable Difficulty moving from lying on back to sitting on the side of the bed? : Unable Difficulty sitting down on and standing up from a chair with arms (e.g., wheelchair, bedside commode, etc,.)?: Unable Help needed moving to and from a bed to chair (including a wheelchair)?: Total Help needed walking in hospital room?: Total Help needed climbing 3-5 steps with a  railing? : Total 6 Click Score: 6    End of Session   Activity Tolerance: Patient limited by pain Patient left: with family/visitor present;with call bell/phone within reach;in bed Nurse Communication: Mobility status PT Visit Diagnosis: Unsteadiness on feet (R26.81);Difficulty in walking, not elsewhere classified (R26.2);Muscle weakness (generalized) (M62.81)     Time: 4175-3010 PT Time Calculation (min) (ACUTE ONLY): 21 min  Charges:  $Therapeutic Activity: 8-22 mins                    G Codes:          Philomena Doheny 11/13/2017, 2:51 PM [phone]

## 2017-11-13 NOTE — Progress Notes (Signed)
PROGRESS NOTE    Mindy Taylor  MRN:[id number] DOB: 09/26/1928 DOA: 11/03/2017 PCP: Plotnikov, Aleksei V, MD    Brief Narrative:  84-year-old female who presented with fatigue, dyspnea on exertion, and dizziness. She does have significant past medical history for multiple myeloma, history of CVA, and hypertension. She had persistent symptoms for last 3 weeks, associated with lower extremity edema.On the initial physical examination temperature 99.5, blood pressure systolic 120, heart rate 70. Moist mucous membranes, lungs clear to auscultation bilaterally, heart sounds S1-S2, present, rhythmic, the abdomen was soft and nontender, positive lower extremity edema.Sodium 136, potassium 4.3, chloride 108, bicarb 26, glucose 104, BUN 21, creatinine 1.23,white blood cell count 5.5, hemoglobin 6.4, hematocrit 19.6, platelets 181.  Urine analysis specific gravity 1.0 13, 0-5 white cells.  Head and neck CT angiography negative for acute changes.  CT of the abdomen with biliary sludge in the gallbladder.  EKG with normal sinus rhythm, normal intervals, normal axis, poor R wave progression.  Patient was admitted to the hospitalwith theworking diagnosis of acute symptomatic anemia   Assessment & Plan:   Active Problems:   Essential hypertension   Cerebral artery occlusion with cerebral infarction (HCC)   Multiple myeloma (HCC)   Anemia   Colonic mass   Goals of care, counseling/discussion   1. Symptomatic anemia due to multiple myeloma. (Patient Jehovah witness). Complicated with hypercalcemia and new AKI. Patient and her family now refusing chemotherapy, clinically patient is more agitated, but seems to be orientated. Will continue to follow hypercalcemia, patient has received zalendronic acid yesterday and today calcitonin, noted worsening cr and patient clinically dry, will add balanced electrolyte solutions for hydration. Serum cr today at 1.39 with K at 4,0, serum bicarbonate at 29 and  calcium stable at 11.1 corrected for albumin is up to 12,5.   2. Newly diagnosed tubular villous adenoma of the colon. No signs of acute bleeding, no procedure indicated until patient is more stable, high risk for bleeding and perforation with endoscopic procedure.   3. HTN.  Will hold on irbesartan to prevent hypotension.  4. Dyslipidemia. On pravastatin with good toleration.   5. Right knee pain due to severe osteoarthritis. Will continue pain control with local diclofenac and tramadol, will hold on narcotics for now due to risk of worsening mentation. Physical therapy evaluation.   DVT prophylaxis:scd Code Status:dnr Family Communication:no family at the bedside Disposition Plan:waiting to correct hypercalcemia and further advance directives before discharge.    Consultants:  Oncology  Procedures:    Antimicrobials    Subjective: Patient this am, very uncomfortable, asking to be discharge home, seems to be orientated with mild agitation. No nausea or vomiting, persistent pain on the right knee, worse with movement.   Objective: Vitals:   11/12/17 0606 11/12/17 1457 11/12/17 1948 11/13/17 0445  BP: 125/63 (!) 118/51 (!) 144/69 (!) 127/56  Pulse: 95 (!) 103 (!) 105 97  Resp: 13 18 16 10  Temp: 97.8 F (36.6 C) 98.3 F (36.8 C) 99 F (37.2 C) 98.2 F (36.8 C)  TempSrc: Oral Oral Oral Oral  SpO2: 98% (!) 45% 100% 100%  Weight:      Height:        Intake/Output Summary (Last 24 hours) at 11/13/2017 0919 Last data filed at 11/12/2017 1438 Gross per 24 hour  Intake 120 ml  Output 700 ml  Net -580 ml   Filed Weights   11/03/17 2337 11/05/17 1020 11/11/17 1550  Weight: 87.5 kg (192 lb 12.8 oz) 87.1   kg (192 lb) 87.5 kg (193 lb)    Examination:   General: deconditioned and ill looking appearing Neurology: Awake and alert, non focal. Mild agitation, able to follow commands.   E ENT: mild pallor, no icterus, oral mucosa moist Cardiovascular:  No JVD. S1-S2 present, rhythmic, no gallops, rubs, or murmurs. No lower extremity edema. Pulmonary: positive breath sounds bilaterally, adequate air movement, no wheezing, rhonchi or rales. Gastrointestinal. Abdomen with no organomegaly, non tender, no rebound or guarding Skin. No rashes Musculoskeletal: no joint deformities/ hypertrophic right kneed, tender to palpation, no edema or erythema, no increased local temperature.      Data Reviewed: I have personally reviewed following labs and imaging studies  CBC: Recent Labs  Lab 11/09/17 0334 11/10/17 0725 11/11/17 0707 11/12/17 0340 11/12/17 1249  WBC 4.5 5.4 6.1 6.4  --   NEUTROABS  --  2.9 3.4 3.4  --   HGB 6.2* 6.2* 6.1* 6.1* 6.2*  HCT 19.3* 19.4* 18.8* 19.3* 19.2*  MCV 103.8* 103.7* 103.3* 103.8*  --   PLT 172 162 152 180  --    Basic Metabolic Panel: Recent Labs  Lab 11/11/17 0707 11/12/17 0340 11/13/17 0343  NA 131* 129* 129*  K 3.9 4.2 4.0  CL 100* 99* 99*  CO2 _0 GLUCOSE 110* 112* 114*  BUN 14 20 22*  CREATININE 1.02* 1.29* 1.39*  CALCIUM 10.1 11.0* 11.1*   GFR: Estimated Creatinine Clearance: 32.4 mL/min (A) (by C-G formula based on SCr of 1.39 mg/dL (H)). Liver Function Tests: Recent Labs  Lab 11/11/17 0707 11/12/17 0340 11/13/17 0343  AST 14* 12* 13*  ALT 11* 10* 11*  ALKPHOS 44 45 47  BILITOT 0.9 1.2 1.1  PROT 11.1* 11.4* 11.6*  ALBUMIN 2.3* 2.3* 2.2*   No results for input(s): LIPASE, AMYLASE in the last 168 hours. No results for input(s): AMMONIA in the last 168 hours. Coagulation Profile: No results for input(s): INR, PROTIME in the last 168 hours. Cardiac Enzymes: No results for input(s): CKTOTAL, CKMB, CKMBINDEX, TROPONINI in the last 168 hours. BNP (last 3 results) No results for input(s): PROBNP in the last 8760 hours. HbA1C: No results for input(s): HGBA1C in the last 72 hours. CBG: No results for input(s): GLUCAP in the last 168 hours. Lipid Profile: No results for  input(s): CHOL, HDL, LDLCALC, TRIG, CHOLHDL, LDLDIRECT in the last 72 hours. Thyroid Function Tests: No results for input(s): TSH, T4TOTAL, FREET4, T3FREE, THYROIDAB in the last 72 hours. Anemia Panel: No results for input(s): VITAMINB12, FOLATE, FERRITIN, TIBC, IRON, RETICCTPCT in the last 72 hours.    Radiology Studies: I have reviewed all of the imaging during this hospital visit personally     Scheduled Meds: . calcitonin  6 Units/kg Intramuscular Once  . diclofenac sodium  2 g Topical QID  . epoetin alfa  20,000 Units Subcutaneous Daily  . famciclovir  500 mg Oral Daily  . feeding supplement (ENSURE ENLIVE)  237 mL Oral BID BM  . irbesartan  150 mg Oral Daily  . pantoprazole  40 mg Oral BID  . pravastatin  40 mg Oral q1800  . traZODone  50 mg Oral QHS   Continuous Infusions:   LOS: 9 days        Kimori Tartaglia Gerome Apley, MD [facility] Pager [phone]

## 2017-11-14 LAB — BASIC METABOLIC PANEL
Anion gap: 4 — ABNORMAL LOW (ref 5–15)
BUN: 32 mg/dL — AB (ref 6–20)
CHLORIDE: 99 mmol/L — AB (ref 101–111)
CO2: 26 mmol/L (ref 22–32)
Calcium: 10.3 mg/dL (ref 8.9–10.3)
Creatinine, Ser: 1.86 mg/dL — ABNORMAL HIGH (ref 0.44–1.00)
GFR calc Af Amer: 27 mL/min — ABNORMAL LOW (ref >60)
GFR calc non Af Amer: 23 mL/min — ABNORMAL LOW (ref >60)
Glucose, Bld: 111 mg/dL — ABNORMAL HIGH (ref 65–99)
POTASSIUM: 3.6 mmol/L (ref 3.5–5.1)
Sodium: 129 mmol/L — ABNORMAL LOW (ref 135–145)

## 2017-11-14 MED ORDER — SODIUM CHLORIDE 0.9 % IV SOLN
INTRAVENOUS | Status: DC
  Administered 2017-11-14 (×2): via INTRAVENOUS
  Administered 2017-11-15: 1000 mL via INTRAVENOUS

## 2017-11-14 NOTE — Progress Notes (Signed)
PROGRESS NOTE    Mindy Taylor  XKG:[id number] DOB: [birth year] DOA: 11/03/2017 PCP: Cassandria Anger, MD    Brief Narrative:  84 year old female who presented with fatigue, dyspnea on exertion, and dizziness. She does have significant past medical history for multiple myeloma, history of CVA, and hypertension. She had persistent symptoms for last 3 weeks, associated with lower extremity edema.On the initial physical examination temperature 99.5, blood pressure systolic 702, heart rate 70. Moist mucous membranes, lungs clear to auscultation bilaterally, heart sounds S1-S2, present, rhythmic, the abdomen was soft and nontender, positive lower extremity edema.Sodium 136, potassium 4.3, chloride 108, bicarb 26, glucose 104, BUN 21, creatinine 1.23,white blood cell count 5.5, hemoglobin 6.4, hematocrit 19.6, platelets 181.Urine analysis specific gravity 1.0 13, 0-5 white cells. Head and neckCT angiography negative for acute changes. CT of the abdomen with biliary sludge in the gallbladder. EKG with normal sinus rhythm, normal intervals, normal axis, poor R wave progression.  Patient was admitted to the hospitalwith theworking diagnosis ofacutesymptomatic anemia   Assessment & Plan:   Active Problems:   Essential hypertension   Cerebral artery occlusion with cerebral infarction ([place])   Multiple myeloma (HCC)   Anemia   Colonic mass   Goals of care, counseling/discussion  1. Symptomatic anemia due to multiple myeloma. (Patient Jehovah witness).Complicated with hypercalcemia and new AKI. Serum ca down to 10.3 today, patient is less agitated. Avoiding excessive blood work, no cell count today, patient is Jehovah witness and has declined therapy for multiple myeloma. Continue symptomatic management and monitor response to zoledronic acid. Will placed on NS for hydration, serum cr up to 1,86 and K at 3,6 with Na at 129 and serum bicarbonate at 26,   2. New metabolic  encephalopathy. Patient more calm this am, had haldol last night, will continue gentle hydration, pain control and neuro checks per unit protocol, out of bed to the chair as tolerated.   2. Newly diagnosed tubular villous adenoma of the colon. No procedure indicated due to high risk for bleeding. Conservative follow up. Patient tolerating po well.    3. HTN. holding antihypertensive medications due to risk of hypotension.   4. Dyslipidemia. Continue pravastatin.   5. Right knee pain due to severe osteoarthritis. Improved pain control, continue topical diclofenac.   DVT prophylaxis:scd Code Status:dnr Family Communication:no family at the bedside Disposition Plan:waiting to correct hypercalcemia and further advance directives before discharge, patient's POA looking into SNF.    Consultants:  Oncology  Procedures:    Antimicrobials   Subjective: Patient reports improvement in her right knee pain, but still persistent, worse with movement and improved with analgesics. No nausea or vomiting. Wants to be discharged home.   Objective: Vitals:   11/13/17 0445 11/13/17 1356 11/13/17 2100 11/14/17 0427  BP: (!) 127/56 (!) 150/64 108/69 100/66  Pulse: 97 (!) 116 (!) 105 97  Resp: '10 16 14 12  '$ Temp: 98.2 F (36.8 C) 100.3 F (37.9 C) 99.9 F (37.7 C) 98.9 F (37.2 C)  TempSrc: Oral Oral Oral Oral  SpO2: 100% 97% 99% 98%  Weight:      Height:        Intake/Output Summary (Last 24 hours) at 11/14/2017 0938 Last data filed at 11/14/2017 0427 Gross per 24 hour  Intake 120 ml  Output 1500 ml  Net -1380 ml   Filed Weights   11/03/17 2337 11/05/17 1020 11/11/17 1550  Weight: 87.5 kg (192 lb 12.8 oz) 87.1 kg (192 lb) 87.5 kg (193 lb)  Examination:   General: deconditioned, more calm than yesterday.  Neurology: Awake and alert, non focal  E ENT: positive pallor, no icterus, oral mucosa moist Cardiovascular: No JVD. S1-S2 present, rhythmic, no  gallops, rubs, or murmurs. No lower extremity edema. Pulmonary: decreased breath sounds bilaterally at bases with no wheezing, rhonchi or rales. Gastrointestinal. Abdomen protuberant no organomegaly, non tender, no rebound or guarding Skin. No rashes Musculoskeletal: right knee with hypertrophy, no increase local temperature or erythema.      Data Reviewed: I have personally reviewed following labs and imaging studies  CBC: Recent Labs  Lab 11/09/17 0334 11/10/17 0725 11/11/17 0707 11/12/17 0340 11/12/17 1249  WBC 4.5 5.4 6.1 6.4  --   NEUTROABS  --  2.9 3.4 3.4  --   HGB 6.2* 6.2* 6.1* 6.1* 6.2*  HCT 19.3* 19.4* 18.8* 19.3* 19.2*  MCV 103.8* 103.7* 103.3* 103.8*  --   PLT 172 162 152 180  --    Basic Metabolic Panel: Recent Labs  Lab 11/11/17 0707 11/12/17 0340 11/13/17 0343 11/14/17 0351  NA 131* 129* 129* 129*  K 3.9 4.2 4.0 3.6  CL 100* 99* 99* 99*  CO2 '30 29 29 26  '$ GLUCOSE 110* 112* 114* 111*  BUN 14 20 22* 32*  CREATININE 1.02* 1.29* 1.39* 1.86*  CALCIUM 10.1 11.0* 11.1* 10.3   GFR: Estimated Creatinine Clearance: 24.2 mL/min (A) (by C-G formula based on SCr of 1.86 mg/dL (H)). Liver Function Tests: Recent Labs  Lab 11/11/17 0707 11/12/17 0340 11/13/17 0343  AST 14* 12* 13*  ALT 11* 10* 11*  ALKPHOS 44 45 47  BILITOT 0.9 1.2 1.1  PROT 11.1* 11.4* 11.6*  ALBUMIN 2.3* 2.3* 2.2*   No results for input(s): LIPASE, AMYLASE in the last 168 hours. No results for input(s): AMMONIA in the last 168 hours. Coagulation Profile: No results for input(s): INR, PROTIME in the last 168 hours. Cardiac Enzymes: No results for input(s): CKTOTAL, CKMB, CKMBINDEX, TROPONINI in the last 168 hours. BNP (last 3 results) No results for input(s): PROBNP in the last 8760 hours. HbA1C: No results for input(s): HGBA1C in the last 72 hours. CBG: No results for input(s): GLUCAP in the last 168 hours. Lipid Profile: No results for input(s): CHOL, HDL, LDLCALC, TRIG,  CHOLHDL, LDLDIRECT in the last 72 hours. Thyroid Function Tests: No results for input(s): TSH, T4TOTAL, FREET4, T3FREE, THYROIDAB in the last 72 hours. Anemia Panel: No results for input(s): VITAMINB12, FOLATE, FERRITIN, TIBC, IRON, RETICCTPCT in the last 72 hours.    Radiology Studies: I have reviewed all of the imaging during this hospital visit personally     Scheduled Meds: . calcitonin  6 Units/kg Intramuscular Once  . diclofenac sodium  2 g Topical QID  . epoetin alfa  20,000 Units Subcutaneous Daily  . famciclovir  500 mg Oral Daily  . feeding supplement (ENSURE ENLIVE)  237 mL Oral BID BM  . pantoprazole  40 mg Oral BID  . pravastatin  40 mg Oral q1800  . traZODone  50 mg Oral QHS   Continuous Infusions: . dextrose 5% lactated ringers       LOS: 10 days        Waunetta Riggle Gerome Apley, MD [facility] Pager [phone]

## 2017-11-15 LAB — BASIC METABOLIC PANEL
ANION GAP: 1 — AB (ref 5–15)
BUN: 30 mg/dL — AB (ref 6–20)
CO2: 26 mmol/L (ref 22–32)
Calcium: 9 mg/dL (ref 8.9–10.3)
Chloride: 102 mmol/L (ref 101–111)
Creatinine, Ser: 1.73 mg/dL — ABNORMAL HIGH (ref 0.44–1.00)
GFR calc Af Amer: 29 mL/min — ABNORMAL LOW (ref >60)
GFR calc non Af Amer: 25 mL/min — ABNORMAL LOW (ref >60)
GLUCOSE: 96 mg/dL (ref 65–99)
POTASSIUM: 3.2 mmol/L — AB (ref 3.5–5.1)
Sodium: 129 mmol/L — ABNORMAL LOW (ref 135–145)

## 2017-11-15 NOTE — Progress Notes (Signed)
Pt sat up in chair for 2 1/2 hours. Family in to visit. Dr. Cathlean Sauer notified of K-3.2

## 2017-11-15 NOTE — Progress Notes (Signed)
PROGRESS NOTE    Mindy Taylor  HFW:[id number] DOB: 03/02/1929 DOA: 11/03/2017 PCP: Cassandria Anger, MD    Brief Narrative:  84 year old female who presented with fatigue, dyspnea on exertion, and dizziness. She does have significant past medical history for multiple myeloma, history of CVA, and hypertension. She had persistent symptoms for last 3 weeks, associated with lower extremity edema.On the initial physical examination temperature 99.5, blood pressure systolic 027, heart rate 70. Moist mucous membranes, lungs clear to auscultation bilaterally, heart sounds S1-S2, present, rhythmic, the abdomen was soft and nontender, positive lower extremity edema.Sodium 136, potassium 4.3, chloride 108, bicarb 26, glucose 104, BUN 21, creatinine 1.23,white blood cell count 5.5, hemoglobin 6.4, hematocrit 19.6, platelets 181.Urine analysis specific gravity 1.0 13, 0-5 white cells. Head and neckCT angiography negative for acute changes. CT of the abdomen with biliary sludge in the gallbladder. EKG with normal sinus rhythm, normal intervals, normal axis, poor R wave progression.  Patient was admitted to the hospitalwith theworking diagnosis ofacutesymptomatic anemia   Assessment & Plan:   Active Problems:   Essential hypertension   Cerebral artery occlusion with cerebral infarction ([facility])   Multiple myeloma (HCC)   Anemia   Colonic mass   Goals of care, counseling/discussion   1. Symptomatic anemia due to multiple myeloma. (Patient Jehovah witness).Complicated with hypercalcemiaand new AKI.Contine to improve serum ca, today at 9.0. Na still low at 129 and K at 3,2. Serum cr down to 1,73 from 1,86. Will continue gentle hydration with isotonic saline and will follow on renal panel in am. Will follow cell count in am, patient has declined chemotherapy and prbc transfusion.   2. New metabolic encephalopathy. Continue to be calm today. Continue to use haldol as needed.   2.  Newly diagnosed tubular villous adenoma of the colon. No procedure indicated due to high risk for bleeding. Conservative follow up. No further work up at this point. No abdominal pain or clinical signs of bleeding.  3. HTN.no antihypertensive medications for now.   4. Dyslipidemia.Onpravastatin.   5. Right knee paindue to severe osteoarthritis. Continue pain control with topical diclofenac.   DVT prophylaxis:scd Code Status:dnr Family Communication:no family at the bedside Disposition Plan:waiting to correct hypercalcemia and further advance directives before discharge, patient's POA looking into SNF.    Consultants:  Oncology  Procedures:    Antimicrobials    Subjective: Right knee pain has improved, this am has rectal pain, no nausea or vomiting, no abdominal pain, no chest pain or dyspnea. Persistent weakness and malaise.   Objective: Vitals:   11/14/17 0427 11/14/17 1353 11/14/17 2028 11/15/17 0457  BP: 100/66 (!) 126/57 115/61 132/66  Pulse: 97 (!) 111 96 91  Resp: '12 16 14 14  '$ Temp: 98.9 F (37.2 C) 98.2 F (36.8 C) 98.4 F (36.9 C) 97.9 F (36.6 C)  TempSrc: Oral Oral Oral Oral  SpO2: 98% 99% 93% 91%  Weight:      Height:        Intake/Output Summary (Last 24 hours) at 11/15/2017 0841 Last data filed at 11/15/2017 0457 Gross per 24 hour  Intake 925 ml  Output 700 ml  Net 225 ml   Filed Weights   11/03/17 2337 11/05/17 1020 11/11/17 1550  Weight: 87.5 kg (192 lb 12.8 oz) 87.1 kg (192 lb) 87.5 kg (193 lb)    Examination:   General: deconditioned and ill looking appearing Neurology: Awake and alert, non focal  E ENT: positive pallor, no icterus, oral mucosa moist Cardiovascular: No JVD.  S1-S2 present, rhythmic, no gallops, rubs, or murmurs. No lower extremity edema. Pulmonary: positive breath sounds bilaterally, adequate air movement, no wheezing, rhonchi or rales. Gastrointestinal. Abdomen protuberant, no organomegaly, non  tender, no rebound or guarding Skin. No rashes Musculoskeletal: no joint deformities/ hypertrophic knees.      Data Reviewed: I have personally reviewed following labs and imaging studies  CBC: Recent Labs  Lab 11/09/17 0334 11/10/17 0725 11/11/17 0707 11/12/17 0340 11/12/17 1249  WBC 4.5 5.4 6.1 6.4  --   NEUTROABS  --  2.9 3.4 3.4  --   HGB 6.2* 6.2* 6.1* 6.1* 6.2*  HCT 19.3* 19.4* 18.8* 19.3* 19.2*  MCV 103.8* 103.7* 103.3* 103.8*  --   PLT 172 162 152 180  --    Basic Metabolic Panel: Recent Labs  Lab 11/11/17 0707 11/12/17 0340 11/13/17 0343 11/14/17 0351 11/15/17 0424  NA 131* 129* 129* 129* 129*  K 3.9 4.2 4.0 3.6 3.2*  CL 100* 99* 99* 99* 102  CO2 '30 29 29 26 26  '$ GLUCOSE 110* 112* 114* 111* 96  BUN 14 20 22* 32* 30*  CREATININE 1.02* 1.29* 1.39* 1.86* 1.73*  CALCIUM 10.1 11.0* 11.1* 10.3 9.0   GFR: Estimated Creatinine Clearance: 26 mL/min (A) (by C-G formula based on SCr of 1.73 mg/dL (H)). Liver Function Tests: Recent Labs  Lab 11/11/17 0707 11/12/17 0340 11/13/17 0343  AST 14* 12* 13*  ALT 11* 10* 11*  ALKPHOS 44 45 47  BILITOT 0.9 1.2 1.1  PROT 11.1* 11.4* 11.6*  ALBUMIN 2.3* 2.3* 2.2*   No results for input(s): LIPASE, AMYLASE in the last 168 hours. No results for input(s): AMMONIA in the last 168 hours. Coagulation Profile: No results for input(s): INR, PROTIME in the last 168 hours. Cardiac Enzymes: No results for input(s): CKTOTAL, CKMB, CKMBINDEX, TROPONINI in the last 168 hours. BNP (last 3 results) No results for input(s): PROBNP in the last 8760 hours. HbA1C: No results for input(s): HGBA1C in the last 72 hours. CBG: No results for input(s): GLUCAP in the last 168 hours. Lipid Profile: No results for input(s): CHOL, HDL, LDLCALC, TRIG, CHOLHDL, LDLDIRECT in the last 72 hours. Thyroid Function Tests: No results for input(s): TSH, T4TOTAL, FREET4, T3FREE, THYROIDAB in the last 72 hours. Anemia Panel: No results for input(s):  VITAMINB12, FOLATE, FERRITIN, TIBC, IRON, RETICCTPCT in the last 72 hours.    Radiology Studies: I have reviewed all of the imaging during this hospital visit personally     Scheduled Meds: . calcitonin  6 Units/kg Intramuscular Once  . diclofenac sodium  2 g Topical QID  . epoetin alfa  20,000 Units Subcutaneous Daily  . famciclovir  500 mg Oral Daily  . feeding supplement (ENSURE ENLIVE)  237 mL Oral BID BM  . pantoprazole  40 mg Oral BID  . pravastatin  40 mg Oral q1800  . traZODone  50 mg Oral QHS   Continuous Infusions: . sodium chloride 100 mL/hr at 11/14/17 1923     LOS: 11 days        Desare Duddy Gerome Apley, MD [facility] Pager [phone]

## 2017-11-16 DIAGNOSIS — D508 Other iron deficiency anemias: Secondary | ICD-10-CM | POA: Diagnosis not present

## 2017-11-16 DIAGNOSIS — M25571 Pain in right ankle and joints of right foot: Secondary | ICD-10-CM | POA: Diagnosis not present

## 2017-11-16 DIAGNOSIS — C9000 Multiple myeloma not having achieved remission: Secondary | ICD-10-CM | POA: Diagnosis not present

## 2017-11-16 DIAGNOSIS — E871 Hypo-osmolality and hyponatremia: Secondary | ICD-10-CM | POA: Diagnosis not present

## 2017-11-16 DIAGNOSIS — I499 Cardiac arrhythmia, unspecified: Secondary | ICD-10-CM | POA: Diagnosis not present

## 2017-11-16 DIAGNOSIS — N189 Chronic kidney disease, unspecified: Secondary | ICD-10-CM | POA: Diagnosis not present

## 2017-11-16 DIAGNOSIS — E876 Hypokalemia: Secondary | ICD-10-CM | POA: Diagnosis not present

## 2017-11-16 DIAGNOSIS — R1900 Intra-abdominal and pelvic swelling, mass and lump, unspecified site: Secondary | ICD-10-CM | POA: Diagnosis not present

## 2017-11-16 DIAGNOSIS — M6281 Muscle weakness (generalized): Secondary | ICD-10-CM | POA: Diagnosis not present

## 2017-11-16 DIAGNOSIS — M25561 Pain in right knee: Secondary | ICD-10-CM | POA: Diagnosis not present

## 2017-11-16 DIAGNOSIS — C189 Malignant neoplasm of colon, unspecified: Secondary | ICD-10-CM | POA: Diagnosis not present

## 2017-11-16 DIAGNOSIS — M2550 Pain in unspecified joint: Secondary | ICD-10-CM | POA: Diagnosis not present

## 2017-11-16 DIAGNOSIS — N390 Urinary tract infection, site not specified: Secondary | ICD-10-CM | POA: Diagnosis not present

## 2017-11-16 DIAGNOSIS — Z789 Other specified health status: Secondary | ICD-10-CM | POA: Diagnosis not present

## 2017-11-16 DIAGNOSIS — K219 Gastro-esophageal reflux disease without esophagitis: Secondary | ICD-10-CM | POA: Diagnosis not present

## 2017-11-16 DIAGNOSIS — Z7401 Bed confinement status: Secondary | ICD-10-CM | POA: Diagnosis not present

## 2017-11-16 DIAGNOSIS — E785 Hyperlipidemia, unspecified: Secondary | ICD-10-CM | POA: Diagnosis not present

## 2017-11-16 DIAGNOSIS — N179 Acute kidney failure, unspecified: Secondary | ICD-10-CM | POA: Diagnosis not present

## 2017-11-16 DIAGNOSIS — R2681 Unsteadiness on feet: Secondary | ICD-10-CM | POA: Diagnosis not present

## 2017-11-16 DIAGNOSIS — F419 Anxiety disorder, unspecified: Secondary | ICD-10-CM | POA: Diagnosis not present

## 2017-11-16 DIAGNOSIS — M1711 Unilateral primary osteoarthritis, right knee: Secondary | ICD-10-CM | POA: Diagnosis not present

## 2017-11-16 DIAGNOSIS — G9341 Metabolic encephalopathy: Secondary | ICD-10-CM | POA: Diagnosis not present

## 2017-11-16 DIAGNOSIS — F4320 Adjustment disorder, unspecified: Secondary | ICD-10-CM | POA: Diagnosis not present

## 2017-11-16 DIAGNOSIS — I10 Essential (primary) hypertension: Secondary | ICD-10-CM | POA: Diagnosis not present

## 2017-11-16 DIAGNOSIS — R443 Hallucinations, unspecified: Secondary | ICD-10-CM | POA: Diagnosis not present

## 2017-11-16 DIAGNOSIS — K31819 Angiodysplasia of stomach and duodenum without bleeding: Secondary | ICD-10-CM | POA: Diagnosis not present

## 2017-11-16 DIAGNOSIS — K639 Disease of intestine, unspecified: Secondary | ICD-10-CM | POA: Diagnosis not present

## 2017-11-16 DIAGNOSIS — D649 Anemia, unspecified: Secondary | ICD-10-CM | POA: Diagnosis not present

## 2017-11-16 DIAGNOSIS — R41841 Cognitive communication deficit: Secondary | ICD-10-CM | POA: Diagnosis not present

## 2017-11-16 DIAGNOSIS — Z79899 Other long term (current) drug therapy: Secondary | ICD-10-CM | POA: Diagnosis not present

## 2017-11-16 DIAGNOSIS — K922 Gastrointestinal hemorrhage, unspecified: Secondary | ICD-10-CM | POA: Diagnosis not present

## 2017-11-16 DIAGNOSIS — B952 Enterococcus as the cause of diseases classified elsewhere: Secondary | ICD-10-CM | POA: Diagnosis not present

## 2017-11-16 LAB — BASIC METABOLIC PANEL
ANION GAP: 2 — AB (ref 5–15)
BUN: 21 mg/dL — ABNORMAL HIGH (ref 6–20)
CALCIUM: 8.4 mg/dL — AB (ref 8.9–10.3)
CHLORIDE: 107 mmol/L (ref 101–111)
CO2: 24 mmol/L (ref 22–32)
Creatinine, Ser: 1.52 mg/dL — ABNORMAL HIGH (ref 0.44–1.00)
GFR calc non Af Amer: 30 mL/min — ABNORMAL LOW (ref >60)
GFR, EST AFRICAN AMERICAN: 34 mL/min — AB (ref >60)
Glucose, Bld: 90 mg/dL (ref 65–99)
Potassium: 3 mmol/L — ABNORMAL LOW (ref 3.5–5.1)
Sodium: 133 mmol/L — ABNORMAL LOW (ref 135–145)

## 2017-11-16 LAB — HEMOGLOBIN AND HEMATOCRIT, BLOOD
HEMATOCRIT: 17.9 % — AB (ref 36.0–46.0)
Hemoglobin: 5.7 g/dL — CL (ref 12.0–15.0)

## 2017-11-16 MED ORDER — DICLOFENAC SODIUM 1 % TD GEL: 2.0000 g | Freq: Four times a day (QID) | TRANSDERMAL | 0 refills | Status: AC

## 2017-11-16 MED ORDER — PANTOPRAZOLE SODIUM 40 MG PO TBEC: 40.0000 mg | DELAYED_RELEASE_TABLET | Freq: Every day | ORAL | 0 refills | Status: AC

## 2017-11-16 MED ORDER — POTASSIUM CHLORIDE 20 MEQ PO PACK
40.0000 meq | PACK | ORAL | Status: AC
  Administered 2017-11-16 (×2): 40 meq via ORAL
  Filled 2017-11-16 (×3): qty 2

## 2017-11-16 MED ORDER — ENSURE ENLIVE PO LIQD: 237.0000 mL | Freq: Two times a day (BID) | ORAL | 12 refills | Status: AC

## 2017-11-16 NOTE — Progress Notes (Signed)
CRITICAL VALUE ALERT  Critical Value: HGB 5.7  Date & Time Notied:  11/16/17 at 0557am  Provider Notified: N/A; MD is aware of low HGB. Patient refusing blood products.   Orders Received/Actions taken: N/A

## 2017-11-16 NOTE — Progress Notes (Signed)
Physical Therapy Treatment Patient Details Name: Mindy Taylor MRN: [id number] DOB: [birth year] Today's Date: 11/16/2017    History of Present Illness Mindy Taylor is a 84 y.o. female with medical history significant for multiple myeloma, CVA, HTN, who presented to the ED with complaints of fatigue, shortness of breath with exertion, and dizziness, of 3 weeks duration.  GI consulted in ED d/t blood in rectum, mass questionable for adeno carcinoma;  s/p bone marrow bx/aspiration on 11/10/17; pt is now amenable to treatment; R knee xray =Significant tricompartmental degenerative joint disease of the right        PT Comments    Pt right knee pain  Much improved today and therefore mobility improved, pt able to take steps to chair and tolerate WBing on RLE; will continue to follow in acute setting; continue PT POC  Follow Up Recommendations  SNF;Supervision for mobility/OOB     Equipment Recommendations  None recommended by PT    Recommendations for Other Services       Precautions / Restrictions Precautions Precautions: Fall Restrictions Weight Bearing Restrictions: No    Mobility  Bed Mobility Overal bed mobility: Needs Assistance Bed Mobility: Supine to Sit     Supine to sit: Min assist     General bed mobility comments: assist with LEs off bed, cues to  use UEs to self assist, incr time  Transfers Overall transfer level: Needs assistance Equipment used: Rolling walker (2 wheeled) Transfers: Sit to/from [facility] Sit to Stand: Min assist;Mod assist Stand pivot transfers: Min assist;Mod assist       General transfer comment: cues for hand placement and wt shift, incr time and cues for sequence needed for stand pivot  Ambulation/Gait             General Gait Details: pivotal steps only    Marine scientist Rankin (Stroke Patients Only)       Balance                                            Cognition Arousal/Alertness: Awake/alert Behavior During Therapy: WFL for tasks assessed/performed Overall Cognitive Status: Within Functional Limits for tasks assessed                                        Exercises General Exercises - Lower Extremity Ankle Circles/Pumps: Both;10 reps;Supine;AROM Long Arc Quad: AROM;Both;Seated;5 reps    General Comments        Pertinent Vitals/Pain Pain Assessment: 0-10 Pain Score: 3  Pain Location: right knee Pain Descriptors / Indicators: Discomfort Pain Intervention(s): Monitored during session;Premedicated before session    Home Living                      Prior Function            PT Goals (current goals can now be found in the care plan section) Acute Rehab PT Goals Patient Stated Goal: go to rehab to get stronger before going home to live alone again. PT Goal Formulation: With patient/family Time For Goal Achievement: 11/16/17 Potential to Achieve Goals: Fair Progress towards PT goals: Progressing toward goals    Frequency  Min 2X/week      PT Plan Current plan remains appropriate    Co-evaluation              AM-PAC PT "6 Clicks" Daily Activity  Outcome Measure  Difficulty turning over in bed (including adjusting bedclothes, sheets and blankets)?: Unable Difficulty moving from lying on back to sitting on the side of the bed? : Unable Difficulty sitting down on and standing up from a chair with arms (e.g., wheelchair, bedside commode, etc,.)?: Unable Help needed moving to and from a bed to chair (including a wheelchair)?: A Lot Help needed walking in hospital room?: A Lot Help needed climbing 3-5 steps with a railing? : Total 6 Click Score: 8    End of Session Equipment Utilized During Treatment: Gait belt Activity Tolerance: Patient tolerated treatment well Patient left: in chair;with call bell/phone within reach;with family/visitor present Nurse  Communication: Mobility status PT Visit Diagnosis: Unsteadiness on feet (R26.81);Difficulty in walking, not elsewhere classified (R26.2);Muscle weakness (generalized) (M62.81) Pain - Right/Left: Right Pain - part of body: Knee     Time: 3818-4037 PT Time Calculation (min) (ACUTE ONLY): 24 min  Charges:  $Therapeutic Activity: 23-37 mins                    G CodesKenyon Ana, PT Pager: [phone] 11/16/2017    [facility] 11/16/2017, 3:50 PM

## 2017-11-16 NOTE — Care Management Important Message (Signed)
Important Message  Patient Details  Name: Mindy Taylor MRN: [id number] Date of Birth: Apr 18, 1929   Medicare Important Message Given:  Yes    Kerin Salen 11/16/2017, 10:46 AMImportant Message  Patient Details  Name: Mindy Taylor MRN: [id number] Date of Birth: [birth year]   Medicare Important Message Given:  Yes    Kerin Salen 11/16/2017, 10:46 AM

## 2017-11-16 NOTE — Discharge Summary (Signed)
Physician Discharge Summary  Mindy Taylor ACZ:[id number] DOB: [birth year] DOA: 11/03/2017  PCP: Cassandria Anger, MD  Admit date: 11/03/2017 Discharge date: 11/16/2017  Admitted From: Home  Disposition:  SNF with palliative care   Recommendations for Outpatient Follow-up and new medication changes:  1. Follow up with Dr. Alain Marion in 7 days 2. Patient was discharged with palliative care services 3. Patient has declined PRBC transfusions or further chemotherapy.     Home Health: no   Equipment/Devices: no    Discharge Condition: stable  CODE STATUS: DNR   Diet recommendation: regular  Brief/Interim Summary: 84 year old female who presented with fatigue, dyspnea on exertion, and dizziness. She does have the significant past medical history for multiple myeloma, history of CVA, and hypertension. She had persistent symptoms for last 3 weeks, associated with lower extremity edema.On the initial physical examination temperature 99.5, blood pressure systolic 109, heart rate 70. Moist mucous membranes, lungs clear to auscultation bilaterally, heart sounds S1-S2, present, rhythmic, the abdomen was soft and nontender, positive lower extremity edema.Sodium 136, potassium 4.3, chloride 108, bicarb 26, glucose 104, BUN 21, creatinine 1.23,white blood cell count 5.5, hemoglobin 6.4, hematocrit 19.6, platelets 181.Urine analysis specific gravity 1.0 13, 0-5 white cells. Head and neckCT angiography negative for acute changes. CT of the abdomen with biliary sludge in the gallbladder. EKG with normal sinus rhythm, normal intervals, normal axis, poor R wave progression.  Patient was admitted to the hospitalwith theworking diagnosis ofacutesymptomatic anemia.   1.  Symptomatic anemia multifactorial.  Patient is a Jehovah witness, declined any PRBC transfusion, her hb remained stable between 6.2 and 5.7.  Iron studies showed iron 47, TIBC 261, transferrin saturation 18, transferrin binding  capacity 214.  Patient received IV iron and erythropoietin.  Further work-up with upper endoscopy showed a single nonbleeding angiodysplastic lesion in the stomach, treated with argon plasma coagulation.  Colonoscopy showed a polypoid mass in the rectosigmoid colon along with internal hemorrhoids.  Biopsy was positive for tubular villous adenoma the colon.  Patient was found high risk for bleeding for surgical or endoscopic procedure.  2.  Multiple myeloma complicated by hypercalcemia.  It was intended to give chemotherapy with the objective to improve anemia related to multiple myeloma.  Patient initially agree with this line of treatment, after further discussion with her family she and her family have decided to decline further chemotherapy.  Patient received biphosphonate therapy with improvement hypercalcemia.  Discharge calcium 8.4.  3.  Acute kidney injury with hyponatremia and hypokalemia.  Likely prerenal acute renal failure, patient received isotonic IV fluids with improvement of kidney function, discharge creatinine 1.52, sodium 133 potassium 3.0.  Patient received potassium chloride before discharge.  4.  Transient metabolic encephalopathy with delirium.  Clinically has improved with supportive medical therapy, multifactorial metabolic encephalopathy including dehydration and electrolytes disturbances.  5.  Hypertension.  Holding antihypertensive agents.  6.  Dyslipidemia.  Patient has a poor prognosis, short life expectancy, will discontinue statins.  7.  Right knee osteoarthritis.  Continue diclofenac topical for pain control, out of bed as tolerated, physical therapy.   Discharge Diagnoses:  Active Problems:   Essential hypertension   Cerebral artery occlusion with cerebral infarction ([place])   Multiple myeloma ([facility])   Anemia   Colonic mass   Goals of care, counseling/discussion    Discharge Instructions   Allergies as of 11/16/2017      Reactions   Bee Venom Swelling    Swelling at site of sting   Ceftin [cefuroxime Axetil] Other (  See Comments)   Made the patient "feel weird"   Other    PT is a Jehovah Witness and wants no blood products.   Spider Antivenin [black Widow Spider Antivenin (l.mactans)] Swelling, Other (See Comments)   Pain and swelling at site of bite      Medication List    STOP taking these medications   amLODipine 5 MG tablet Commonly known as:  NORVASC   celecoxib 200 MG capsule Commonly known as:  CELEBREX   cephALEXin 500 MG capsule Commonly known as:  KEFLEX   fish oil-omega-3 fatty acids 1000 MG capsule   HYDROcodone-acetaminophen 5-325 MG tablet Commonly known as:  NORCO/VICODIN   LORazepam 0.5 MG tablet Commonly known as:  ATIVAN   olmesartan 40 MG tablet Commonly known as:  BENICAR   pravastatin 40 MG tablet Commonly known as:  PRAVACHOL   TOPROL XL 50 MG 24 hr tablet Generic drug:  metoprolol succinate   vitamin C 500 MG tablet Commonly known as:  ASCORBIC ACID   Vitamin D 1000 units capsule     TAKE these medications   diclofenac sodium 1 % Gel Commonly known as:  VOLTAREN Apply 2 g topically 4 (four) times daily.   feeding supplement (ENSURE ENLIVE) Liqd Take 237 mLs by mouth 2 (two) times daily between meals.   multivitamin tablet Take 1 tablet by mouth daily.   pantoprazole 40 MG tablet Commonly known as:  PROTONIX Take 1 tablet (40 mg total) by mouth daily.       Allergies  Allergen Reactions  . Bee Venom Swelling    Swelling at site of sting  . Ceftin [Cefuroxime Axetil] Other (See Comments)    Made the patient "feel weird"  . Other     PT is a Jehovah Witness and wants no blood products.  . Spider Antivenin [Black Widow Spider Antivenin (L.Mactans)] Swelling and Other (See Comments)    Pain and swelling at site of bite    Consultations:  Oncology    Procedures/Studies: Ct Angio Head W Or Wo Contrast  Result Date: 11/03/2017 CLINICAL DATA:  Dizziness, headache,  imbalance, and generalized weakness for 1 month. EXAM: CT ANGIOGRAPHY HEAD AND NECK TECHNIQUE: Multidetector CT imaging of the head and neck was performed using the standard protocol during bolus administration of intravenous contrast. Multiplanar CT image reconstructions and MIPs were obtained to evaluate the vascular anatomy. Carotid stenosis measurements (when applicable) are obtained utilizing NASCET criteria, using the distal internal carotid diameter as the denominator. CONTRAST:  4m ISOVUE-370 IOPAMIDOL (ISOVUE-370) INJECTION 76% COMPARISON:  Neck CTA 04/04/2011.  Brain MRI 01/02/2010. FINDINGS: CT HEAD FINDINGS Brain: There is no evidence of acute infarct, intracranial hemorrhage, mass, midline shift, or extra-axial fluid collection. The ventricles and sulci are normal for age. Patchy cerebral white matter hypodensities are nonspecific but compatible with chronic small vessel ischemic disease, mild-to-moderate for age and similar in distribution to the T2 hyperintensities on the prior MRI. Vascular: Calcified atherosclerosis at the skull base. No hyperdense vessel. Skull: No fracture or focal osseous lesion. Sinuses: Visualized paranasal sinuses and mastoid air cells are clear. Orbits: Unremarkable. Review of the MIP images confirms the above findings CTA NECK FINDINGS Aortic arch: There is a standard 3 vessel aortic arch. Arch vessel origins are widely patent. Right carotid system: Patent without evidence of stenosis or dissection. Mild atherosclerotic plaque in the carotid bulb. Tortuous proximal common carotid artery and mid cervical ICA. Left carotid system: Patent without evidence of stenosis or dissection. Mild calcified  plaque at the carotid bifurcation. Vertebral arteries: Patent without evidence of stenosis or dissection. Strongly dominant left vertebral artery with focal nonstenotic calcified plaque at its origin. Skeleton: Chronically advanced disc degeneration at C6-7 greater than C5-6.  Moderate to advanced cervical facet arthrosis with unchanged grade 1 anterolisthesis of C4 on C5. Other neck: Similar appearance of left thyroid goiter which posteriorly displaces the proximal left common carotid and left subclavian arteries. Upper chest: Clear lung apices. Review of the MIP images confirms the above findings CTA HEAD FINDINGS Anterior circulation: The internal carotid arteries are patent from skull base to carotid termini with mild nonstenotic siphon atherosclerosis bilaterally. ACAs and MCAs are patent without evidence of proximal branch occlusion or flow limiting proximal stenosis. There may be a scratched of there may be an incidental fenestration of the distal right A1 segment, a normal variant. No aneurysm is identified. Posterior circulation: The intracranial vertebral arteries are patent to the basilar without focal stenosis. The right vertebral artery is particularly hypoplastic distal to the PICA origin. Patent PICA, AICA, and SCA origins are identified bilaterally. The basilar artery is widely patent. There are patent posterior communicating arteries bilaterally, right slightly larger than left. PCAs are patent without evidence of significant stenosis. No aneurysm is identified. Venous sinuses: Patent. Anatomic variants: None of significance. Delayed phase: No abnormal enhancement. Review of the MIP images confirms the above findings IMPRESSION: 1. No evidence of acute intracranial abnormality. 2. Mild-to-moderate chronic small vessel ischemic disease. 3. Mild intracranial and cervical atherosclerosis without major vessel occlusion or significant stenosis. Electronically Signed   By: Logan Bores M.D.   On: 11/03/2017 17:12   Ct Angio Neck W And/or Wo Contrast  Result Date: 11/03/2017 CLINICAL DATA:  Dizziness, headache, imbalance, and generalized weakness for 1 month. EXAM: CT ANGIOGRAPHY HEAD AND NECK TECHNIQUE: Multidetector CT imaging of the head and neck was performed using the  standard protocol during bolus administration of intravenous contrast. Multiplanar CT image reconstructions and MIPs were obtained to evaluate the vascular anatomy. Carotid stenosis measurements (when applicable) are obtained utilizing NASCET criteria, using the distal internal carotid diameter as the denominator. CONTRAST:  69m ISOVUE-370 IOPAMIDOL (ISOVUE-370) INJECTION 76% COMPARISON:  Neck CTA 04/04/2011.  Brain MRI 01/02/2010. FINDINGS: CT HEAD FINDINGS Brain: There is no evidence of acute infarct, intracranial hemorrhage, mass, midline shift, or extra-axial fluid collection. The ventricles and sulci are normal for age. Patchy cerebral white matter hypodensities are nonspecific but compatible with chronic small vessel ischemic disease, mild-to-moderate for age and similar in distribution to the T2 hyperintensities on the prior MRI. Vascular: Calcified atherosclerosis at the skull base. No hyperdense vessel. Skull: No fracture or focal osseous lesion. Sinuses: Visualized paranasal sinuses and mastoid air cells are clear. Orbits: Unremarkable. Review of the MIP images confirms the above findings CTA NECK FINDINGS Aortic arch: There is a standard 3 vessel aortic arch. Arch vessel origins are widely patent. Right carotid system: Patent without evidence of stenosis or dissection. Mild atherosclerotic plaque in the carotid bulb. Tortuous proximal common carotid artery and mid cervical ICA. Left carotid system: Patent without evidence of stenosis or dissection. Mild calcified plaque at the carotid bifurcation. Vertebral arteries: Patent without evidence of stenosis or dissection. Strongly dominant left vertebral artery with focal nonstenotic calcified plaque at its origin. Skeleton: Chronically advanced disc degeneration at C6-7 greater than C5-6. Moderate to advanced cervical facet arthrosis with unchanged grade 1 anterolisthesis of C4 on C5. Other neck: Similar appearance of left thyroid goiter which posteriorly  displaces  the proximal left common carotid and left subclavian arteries. Upper chest: Clear lung apices. Review of the MIP images confirms the above findings CTA HEAD FINDINGS Anterior circulation: The internal carotid arteries are patent from skull base to carotid termini with mild nonstenotic siphon atherosclerosis bilaterally. ACAs and MCAs are patent without evidence of proximal branch occlusion or flow limiting proximal stenosis. There may be a scratched of there may be an incidental fenestration of the distal right A1 segment, a normal variant. No aneurysm is identified. Posterior circulation: The intracranial vertebral arteries are patent to the basilar without focal stenosis. The right vertebral artery is particularly hypoplastic distal to the PICA origin. Patent PICA, AICA, and SCA origins are identified bilaterally. The basilar artery is widely patent. There are patent posterior communicating arteries bilaterally, right slightly larger than left. PCAs are patent without evidence of significant stenosis. No aneurysm is identified. Venous sinuses: Patent. Anatomic variants: None of significance. Delayed phase: No abnormal enhancement. Review of the MIP images confirms the above findings IMPRESSION: 1. No evidence of acute intracranial abnormality. 2. Mild-to-moderate chronic small vessel ischemic disease. 3. Mild intracranial and cervical atherosclerosis without major vessel occlusion or significant stenosis. Electronically Signed   By: Logan Bores M.D.   On: 11/03/2017 17:12   Ct Abdomen Pelvis W Contrast  Result Date: 11/06/2017 CLINICAL DATA:  84 year old female with history of multiple myeloma. Three week history of fatigue and shortness of breath with exertion, with some dizziness. EXAM: CT ABDOMEN AND PELVIS WITH CONTRAST TECHNIQUE: Multidetector CT imaging of the abdomen and pelvis was performed using the standard protocol following bolus administration of intravenous contrast. CONTRAST:  25m  ISOVUE-300 IOPAMIDOL (ISOVUE-300) INJECTION 61%, 342mISOVUE-300 IOPAMIDOL (ISOVUE-300) INJECTION 61% COMPARISON:  None. FINDINGS: Lower chest: Mild cardiomegaly. Atherosclerotic calcifications in the left anterior descending and right coronary arteries. Calcifications of the aortic valve and mitral annulus. Hepatobiliary: Several well-defined low-attenuation lesions in the liver, largest of which measures 2.9 cm in segment 7. This largest lesion is compatible with a simple cyst. The smaller lesions are too small to characterize, but are likely to represent tiny cysts or biliary hamartomas. No suspicious hepatic lesions are noted. There may be very mild intrahepatic biliary ductal dilatation. Common bile duct measures 7 mm in the porta hepatis (within normal limits for the patient's age). Amorphous high attenuation material lying dependently in the gallbladder likely reflects biliary sludge. Gallbladder does not appear distended. No pericholecystic fluid or surrounding inflammatory changes. Pancreas: No pancreatic mass. No pancreatic ductal dilatation. No pancreatic or peripancreatic fluid or inflammatory changes. Spleen: Unremarkable. Adrenals/Urinary Tract: Subcentimeter low-attenuation lesion in the interpolar region of the right kidney is too small to characterize, but is statistically likely a tiny cyst. Left kidney and bilateral adrenal glands are normal in appearance. There is no hydroureteronephrosis. Urinary bladder is normal in appearance. Stomach/Bowel: Normal appearance of the stomach. No pathologic dilatation of small bowel or colon. The appendix is not confidently identified and may be surgically absent. Regardless, there are no inflammatory changes noted adjacent to the cecum to suggest the presence of an acute appendicitis at this time. Vascular/Lymphatic: Aortic atherosclerosis, without evidence of aneurysm or dissection in the abdominal or pelvic vasculature. No lymphadenopathy noted in the abdomen  or pelvis. Reproductive: Status post hysterectomy. Ovaries are not confidently identified may be surgically absent or atrophic. Other: No significant volume of ascites.  No pneumoperitoneum. Musculoskeletal: There are no aggressive appearing lytic or blastic lesions noted in the visualized portions of the skeleton. IMPRESSION: 1.  Biliary sludge in the gallbladder. No findings to strongly suggest an acute cholecystitis are noted at this time. However, there could be some very mild intrahepatic biliary ductal dilatation. If there is any clinical concern for biliary tract obstruction, further evaluation with MRI of the abdomen with and without IV gadolinium with MRCP could be considered. 2. Aortic atherosclerosis, in addition to at least 2 vessel coronary artery disease. 3. There are calcifications of the aortic valve and mitral annulus. Echocardiographic correlation for evaluation of potential valvular dysfunction may be warranted if clinically indicated. 4. Additional incidental findings, as above. Aortic Atherosclerosis (ICD10-I70.0). Electronically Signed   By: Vinnie Langton M.D.   On: 11/06/2017 08:12   Ct Biopsy  Result Date: 11/10/2017 INDICATION: History of multiple myeloma. Please perform CT-guided bone marrow biopsy for tissue diagnostic purposes. EXAM: CT-GUIDED BONE MARROW BIOPSY AND ASPIRATION MEDICATIONS: None ANESTHESIA/SEDATION: Fentanyl 100 mcg IV; Versed 1 mg IV Sedation Time: 10 Minutes; The patient was continuously monitored during the procedure by the interventional radiology nurse under my direct supervision. COMPLICATIONS: None immediate. PROCEDURE: Informed consent was obtained from the patient following an explanation of the procedure, risks, benefits and alternatives. The patient understands, agrees and consents for the procedure. All questions were addressed. A time out was performed prior to the initiation of the procedure. The patient was positioned prone and non-contrast  localization CT was performed of the pelvis to demonstrate the iliac marrow spaces. The operative site was prepped and draped in the usual sterile fashion. Under sterile conditions and local anesthesia, a 22 gauge spinal needle was utilized for procedural planning. Next, an 11 gauge coaxial bone biopsy needle was advanced into the left iliac marrow space. Needle position was confirmed with CT imaging. Initially, bone marrow aspiration was performed. Next, a bone marrow biopsy was obtained with the 11 gauge outer bone marrow device. Samples were prepared with the cytotechnologist and deemed adequate. The needle was removed intact. Hemostasis was obtained with compression and a dressing was placed. The patient tolerated the procedure well without immediate post procedural complication. IMPRESSION: Successful CT guided left iliac bone marrow aspiration and core biopsy. Electronically Signed   By: Sandi Mariscal M.D.   On: 11/10/2017 12:32   Dg Knee Right Port  Result Date: 11/10/2017 CLINICAL DATA:  Twisted the right knee, now with diffuse right knee pain EXAM: PORTABLE RIGHT KNEE - 1-2 VIEW COMPARISON:  None. FINDINGS: There is significant tricompartmental degenerative joint disease of the right knee primarily involving the lateral compartment where there is almost complete loss of joint space with sclerosis and spurring. Loss of medial compartment and patellofemoral compartment joint space also is noted with degenerative spurring. No acute fracture is seen. No significant joint effusion is noted. IMPRESSION: Significant tricompartmental degenerative joint disease of the right knee primarily involving the lateral compartment. Electronically Signed   By: Ivar Drape M.D.   On: 11/10/2017 09:40   Dg Abd 2 Views  Result Date: 11/07/2017 CLINICAL DATA:  Loose stools this week.  Constipation? EXAM: ABDOMEN - 2 VIEW COMPARISON:  CT abdomen dated 11/05/2017. FINDINGS: Bowel gas pattern is nonobstructive. Typical amount  of stool in the colon. No evidence of soft tissue mass or abnormal fluid collection. No evidence of free intraperitoneal air. Degenerative changes throughout the scoliotic thoracolumbar spine, mild to moderate in degree. No acute or suspicious osseous finding. IMPRESSION: Negative. Nonobstructive bowel gas pattern. No radiographic evidence of constipation. Electronically Signed   By: Franki Cabot M.D.   On: 11/07/2017 15:39  Ct Bone Marrow Biopsy & Aspiration  Result Date: 11/10/2017 INDICATION: History of multiple myeloma. Please perform CT-guided bone marrow biopsy for tissue diagnostic purposes. EXAM: CT-GUIDED BONE MARROW BIOPSY AND ASPIRATION MEDICATIONS: None ANESTHESIA/SEDATION: Fentanyl 100 mcg IV; Versed 1 mg IV Sedation Time: 10 Minutes; The patient was continuously monitored during the procedure by the interventional radiology nurse under my direct supervision. COMPLICATIONS: None immediate. PROCEDURE: Informed consent was obtained from the patient following an explanation of the procedure, risks, benefits and alternatives. The patient understands, agrees and consents for the procedure. All questions were addressed. A time out was performed prior to the initiation of the procedure. The patient was positioned prone and non-contrast localization CT was performed of the pelvis to demonstrate the iliac marrow spaces. The operative site was prepped and draped in the usual sterile fashion. Under sterile conditions and local anesthesia, a 22 gauge spinal needle was utilized for procedural planning. Next, an 11 gauge coaxial bone biopsy needle was advanced into the left iliac marrow space. Needle position was confirmed with CT imaging. Initially, bone marrow aspiration was performed. Next, a bone marrow biopsy was obtained with the 11 gauge outer bone marrow device. Samples were prepared with the cytotechnologist and deemed adequate. The needle was removed intact. Hemostasis was obtained with compression  and a dressing was placed. The patient tolerated the procedure well without immediate post procedural complication. IMPRESSION: Successful CT guided left iliac bone marrow aspiration and core biopsy. Electronically Signed   By: Sandi Mariscal M.D.   On: 11/10/2017 12:32       Subjective: Patient is feeling better, this am, feeling very weak and deconditioned. Improved right knee, no further confusion or agitation.    Discharge Exam: Vitals:   11/15/17 2219 11/16/17 0512  BP: 136/60 (!) 126/59  Pulse: 97 85  Resp: 16 16  Temp: 98.6 F (37 C) 97.9 F (36.6 C)  SpO2: 99% 99%   Vitals:   11/15/17 0457 11/15/17 1418 11/15/17 2219 11/16/17 0512  BP: 132/66 (!) 119/53 136/60 (!) 126/59  Pulse: 91 (!) 103 97 85  Resp: '14 18 16 16  '$ Temp: 97.9 F (36.6 C) 98.7 F (37.1 C) 98.6 F (37 C) 97.9 F (36.6 C)  TempSrc: Oral Oral Oral Oral  SpO2: 91% 100% 99% 99%  Weight:      Height:         General: deconditioned  Neurology: Awake and alert, non focal  E ENT: positive pallor, no icterus, oral mucosa moist Cardiovascular: No JVD. S1-S2 present, rhythmic, no gallops, rubs, or murmurs. No lower extremity edema. Pulmonary: decreased breath sounds bilaterally at bases, adequate air movement, no wheezing, rhonchi or rales. Gastrointestinal. Abdomen protuberant with no organomegaly, non tender, no rebound or guarding Skin. No rashes Musculoskeletal: no joint deformities/ hypertrophic knees bilaterally.      The results of significant diagnostics from this hospitalization (including imaging, microbiology, ancillary and laboratory) are listed below for reference.     Microbiology: No results found for this or any previous visit (from the past 240 hour(s)).   Labs: BNP (last 3 results) No results for input(s): BNP in the last 8760 hours. Basic Metabolic Panel: Recent Labs  Lab 11/12/17 0340 11/13/17 0343 11/14/17 0351 11/15/17 0424 11/16/17 0508  NA 129* 129* 129* 129* 133*   K 4.2 4.0 3.6 3.2* 3.0*  CL 99* 99* 99* 102 107  CO2 '29 29 26 26 24  '$ GLUCOSE 112* 114* 111* 96 90  BUN 20 22* 32* 30* 21*  CREATININE 1.29* 1.39* 1.86* 1.73* 1.52*  CALCIUM 11.0* 11.1* 10.3 9.0 8.4*   Liver Function Tests: Recent Labs  Lab 11/11/17 0707 11/12/17 0340 11/13/17 0343  AST 14* 12* 13*  ALT 11* 10* 11*  ALKPHOS 44 45 47  BILITOT 0.9 1.2 1.1  PROT 11.1* 11.4* 11.6*  ALBUMIN 2.3* 2.3* 2.2*   No results for input(s): LIPASE, AMYLASE in the last 168 hours. No results for input(s): AMMONIA in the last 168 hours. CBC: Recent Labs  Lab 11/10/17 0725 11/11/17 0707 11/12/17 0340 11/12/17 1249 11/16/17 0508  WBC 5.4 6.1 6.4  --   --   NEUTROABS 2.9 3.4 3.4  --   --   HGB 6.2* 6.1* 6.1* 6.2* 5.7*  HCT 19.4* 18.8* 19.3* 19.2* 17.9*  MCV 103.7* 103.3* 103.8*  --   --   PLT 162 152 180  --   --    Cardiac Enzymes: No results for input(s): CKTOTAL, CKMB, CKMBINDEX, TROPONINI in the last 168 hours. BNP: Invalid input(s): POCBNP CBG: No results for input(s): GLUCAP in the last 168 hours. D-Dimer No results for input(s): DDIMER in the last 72 hours. Hgb A1c No results for input(s): HGBA1C in the last 72 hours. Lipid Profile No results for input(s): CHOL, HDL, LDLCALC, TRIG, CHOLHDL, LDLDIRECT in the last 72 hours. Thyroid function studies No results for input(s): TSH, T4TOTAL, T3FREE, THYROIDAB in the last 72 hours.  Invalid input(s): FREET3 Anemia work up No results for input(s): VITAMINB12, FOLATE, FERRITIN, TIBC, IRON, RETICCTPCT in the last 72 hours. Urinalysis    Component Value Date/Time   COLORURINE YELLOW 11/03/2017 1344   APPEARANCEUR CLEAR 11/03/2017 1344   LABSPEC 1.013 11/03/2017 1344   PHURINE 5.0 11/03/2017 1344   GLUCOSEU NEGATIVE 11/03/2017 1344   GLUCOSEU NEGATIVE 09/06/2012 0900   HGBUR LARGE (A) 11/03/2017 1344   BILIRUBINUR NEGATIVE 11/03/2017 1344   KETONESUR NEGATIVE 11/03/2017 1344   PROTEINUR NEGATIVE 11/03/2017 1344    UROBILINOGEN 0.2 03/14/2015 1300   NITRITE NEGATIVE 11/03/2017 1344   LEUKOCYTESUR NEGATIVE 11/03/2017 1344   Sepsis Labs Invalid input(s): PROCALCITONIN,  WBC,  LACTICIDVEN Microbiology No results found for this or any previous visit (from the past 240 hour(s)).   Time coordinating discharge: 45 minutes  SIGNED:   Tawni Millers, MD  [facility] 11/16/2017, 12:23 PM Pager [phone]  If 7PM-7AM, please contact night-coverage [email or web address] Password [email or web address]

## 2017-11-16 NOTE — Clinical Social Work Placement (Signed)
Plan for pt to admit to [facility]- report [phone]. Pt's niece going to facility to complete admission paperwork this afternoon.  Will provide pt's DC information via the [facility]. Plan per pt and niece is for pt to participate in rehab with goal of gaining enough strength to return home with hospice following rehab.   CLINICAL SOCIAL WORK PLACEMENT  NOTE  Date:  11/16/2017  Patient Details  Name: Mindy Taylor MRN: [id number] Date of Birth: [birth year]  Clinical Social Work is seeking post-discharge placement for this patient at the [place] level of care (*CSW will initial, date and re-position this form in  chart as items are completed):  Yes   Patient/family provided with [facility] Work Department's list of facilities offering this level of care within the geographic area requested by the patient (or if unable, by the patient's family).  Yes   Patient/family informed of their freedom to choose among providers that offer the needed level of care, that participate in Medicare, Medicaid or managed care program needed by the patient, have an available bed and are willing to accept the patient.  Yes   Patient/family informed of [facility]'s ownership interest in [facility] and [facility], as well as of the fact that they are under no obligation to receive care at these facilities.  PASRR submitted to [facility] on 11/09/17     PASRR number received on       Existing PASRR number confirmed on       FL2 transmitted to all facilities in geographic area requested by pt/family on 11/09/17     FL2 transmitted to all facilities within larger geographic area on       Patient informed that his/her managed care company has contracts with or will negotiate with certain facilities, including the following:        Yes   Patient/family informed of bed offers received.  Patient chooses bed at       Physician recommends and patient chooses bed at [facility]    Patient to be transferred to [facility] on 11/16/17.  Patient to be transferred to facility by [facility]     Patient family notified on 11/16/17 of transfer.  Name of family member notified:  niece [place]     PHYSICIAN       Additional Comment:    _______________________________________________ Nila Nephew, LCSW 11/16/2017, 12:34 PM  [phone]

## 2017-11-16 NOTE — Progress Notes (Signed)
Ms. Lehan is not feeling well this morning.  She is definitely feeling weaker.  Her hemoglobin is 5.7.  Her bone marrow biopsy shows 75% involvement of her marrow by myeloma.  As such, I sincerely doubt that the [facility] is really going to be able to help her much.  She, and her family, obviously have differences of opinion.  She wants to know why we are not treating her.  I told her that her family refused to let me treat her.  They tell me that she was not able to make her own decisions.  She obviously was very surprised to hear this.  At this point, I am not sure if treatment can really do much.  I am sure we could try.  However, there could be some toxicity.  She just has no energy.  She does not appear to be eating all that much.  How much her how much physical therapy she is doing.  Again, her family, whom I talked to last week, did not want her to get treated.  They really did not give me a reason why.  We just do not have much else that we can offer her right now.  I thought that hospice would be a good idea for her.  I would think trying to get her to a hospice home would be very reasonable.  She says that she is going to talk with her family today.  Lattie Haw, MD  1 Timothy 4:10

## 2017-11-16 NOTE — Progress Notes (Signed)
Pt discharged to [facility] by ambulance. Report given to [employer] at [facility]. Pt happy to be discharged. Hemoglobin 5.7, pt is a Jehovah witness and refuses any blood products. Dr. Cathlean Sauer aware.

## 2017-11-17 ENCOUNTER — Telehealth: Payer: Self-pay | Admitting: *Deleted

## 2017-11-17 NOTE — Telephone Encounter (Signed)
Tried calling pt to set-up TCM Hosp f/u appt no answer LMOM RTC.Marland Kitchensent CRM msg to inform [facility] if pt call back.Marland KitchenJohny Chess

## 2017-11-18 ENCOUNTER — Non-Acute Institutional Stay (SKILLED_NURSING_FACILITY): Payer: Medicare Other | Admitting: Internal Medicine

## 2017-11-18 ENCOUNTER — Encounter (HOSPITAL_COMMUNITY): Payer: Self-pay | Admitting: Hematology & Oncology

## 2017-11-18 ENCOUNTER — Encounter: Payer: Self-pay | Admitting: Internal Medicine

## 2017-11-18 DIAGNOSIS — Z789 Other specified health status: Secondary | ICD-10-CM

## 2017-11-18 DIAGNOSIS — N179 Acute kidney failure, unspecified: Secondary | ICD-10-CM

## 2017-11-18 DIAGNOSIS — D649 Anemia, unspecified: Secondary | ICD-10-CM | POA: Diagnosis not present

## 2017-11-18 DIAGNOSIS — D508 Other iron deficiency anemias: Secondary | ICD-10-CM | POA: Diagnosis not present

## 2017-11-18 DIAGNOSIS — K219 Gastro-esophageal reflux disease without esophagitis: Secondary | ICD-10-CM

## 2017-11-18 DIAGNOSIS — E785 Hyperlipidemia, unspecified: Secondary | ICD-10-CM | POA: Diagnosis not present

## 2017-11-18 DIAGNOSIS — E871 Hypo-osmolality and hyponatremia: Secondary | ICD-10-CM | POA: Diagnosis not present

## 2017-11-18 DIAGNOSIS — G8929 Other chronic pain: Secondary | ICD-10-CM

## 2017-11-18 DIAGNOSIS — I10 Essential (primary) hypertension: Secondary | ICD-10-CM

## 2017-11-18 DIAGNOSIS — IMO0001 Reserved for inherently not codable concepts without codable children: Secondary | ICD-10-CM

## 2017-11-18 DIAGNOSIS — E876 Hypokalemia: Secondary | ICD-10-CM | POA: Diagnosis not present

## 2017-11-18 DIAGNOSIS — G9341 Metabolic encephalopathy: Secondary | ICD-10-CM | POA: Diagnosis not present

## 2017-11-18 DIAGNOSIS — M25561 Pain in right knee: Secondary | ICD-10-CM

## 2017-11-18 DIAGNOSIS — C9000 Multiple myeloma not having achieved remission: Secondary | ICD-10-CM

## 2017-11-18 DIAGNOSIS — M25562 Pain in left knee: Secondary | ICD-10-CM

## 2017-11-18 DIAGNOSIS — K639 Disease of intestine, unspecified: Secondary | ICD-10-CM | POA: Diagnosis not present

## 2017-11-18 DIAGNOSIS — K31819 Angiodysplasia of stomach and duodenum without bleeding: Secondary | ICD-10-CM

## 2017-11-18 DIAGNOSIS — K6389 Other specified diseases of intestine: Secondary | ICD-10-CM

## 2017-11-18 DIAGNOSIS — F23 Brief psychotic disorder: Secondary | ICD-10-CM

## 2017-11-18 NOTE — Telephone Encounter (Signed)
Called pt again still no answer LMOM to [facility] and schedule hosp f/u appt.Marland KitchenJohny Chess

## 2017-11-18 NOTE — Progress Notes (Addendum)
:    Location:  [facility] Room Number: 110-P Place of Service:  SNF (31)   Mindy Taylor Coil, MD   PCP: Cassandria Anger, MD Patient Care Team: Cassandria Anger, MD as PCP - General Berle Mull, MD as Attending Physician (Family Medicine) [name], MD as Consulting Physician (Internal Medicine) [name], MD as Rounding Team (Internal Medicine)  Extended Emergency Contact Information Primary Emergency Contact: Mindy Taylor,Mindy Taylor Address: [address]y [address]          [place], [place] [address] [place]y [place] of [place] Phone: [phone] Mobile Phone: [phone] Relation: Niece Secondary Emergency Contact: Mindy Taylor [place] of [place] Phone: [phone] Relation: Other     Allergies: Bee venom; Ceftin [cefuroxime axetil]; Other; and Spider antivenin [black widow spider antivenin (l.mactans)]  Chief Complaint  Patient presents with  . New Admit To SNF    Admit to [facility]    HPI: Patient is 84 y.o. female with multiple myeloma, CVA, hypertension, who presented to the ED with complaints of fatigue, shortness of breath with exertion, and dizziness of 3 weeks duration.  Also reported left lower extremity swelling over the past 3 weeks.  Patient denied chest pain cough fever chills.  She reported that she took 2 tablets of ibuprofen 3-4 times weekly.  She reported that her stool color is mostly unchanged, denies blood in stools denies abdominal pain, admits to colonoscopy in the past.  In the ED patient's systolic blood pressure was in the 120s 140s heart rate was in the 70s 90s hemoglobin was 6.4 down from 7.8 2 months ago with a baseline of 10-11.  There was some blood in her rectum and a small amount of gross blood in stool and rectum.  GI was consulted in the ED and will see patient in the a.m.  Patient refused blood transfusion in the ED as she is Jehovah's Witness.  Patient has declined any transfusions or any  further chemotherapy.  Patient was admitted to [facility] from 6/11-24 for symptomatic anemia.  Work-up for that revealed a tubular villous adenoma in the colon.  Patient was found to be high risk for bleeding for surgical or endoscopic procedure.  Upper endoscopy showed a nonbleeding angioplastic lesion in the stomach treated with argon plasma coagulation.  Patient had hyperglycemia secondary to her multiple myeloma and received biphosphonate therapy to improve her hypercalcemia.  Patient had decided to decline further chemotherapy.  Patient had acute kidney injury with hyponatremia and hypokalemia likely prerenal acute renal failure and patient received IV fluids with improvement of renal function.  Patient did have transient metabolic encephalopathy with delirium which improved with supportive medical treatment.  Patient is admitted to skilled nursing facility for OT/PT.  While at skilled nursing facility patient will be followed for right knee pain treated with diclofenac gel, and GERD treated with Protonix.  Patient had multiple medications stopped including her blood pressure medication her cholesterol medications and anxiety medications.  Past Medical History:  Diagnosis Date  . ANXIETY 03/05/2007  . ATAXIA 12/26/2009  . BEE STING 03/05/2007  . BRONCHITIS, ACUTE 06/28/2010  . CANDIDIASIS, VAGINAL 09/08/2008  . CEREBROVASCULAR ACCIDENT, HX OF 01/04/2010  . CERUMEN IMPACTION 06/05/2008  . DIZZINESS 12/26/2009  . Epistaxis 06/05/2008  . Goals of care, counseling/discussion 11/12/2017  . Headache(784.0) 06/05/2008  . HYPERLIPIDEMIA 12/08/2006  . HYPERTENSION 12/08/2006  . 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 Multiple myeloma ([place]) 04/03/2015  . NECK PAIN 06/05/2008  .  OBESITY 09/07/2009  . ONYCHOMYCOSIS 03/09/2009  . OSTEOARTHRITIS 12/08/2006  . PARESTHESIA 12/26/2009  . TOBACCO USE, QUIT 03/09/2009    Past Surgical History:  Procedure Laterality Date  . ABDOMINAL HYSTERECTOMY    . BIOPSY  11/05/2017    Procedure: BIOPSY;  Surgeon: Yetta Flock, MD;  Location: [facility];  Service: Gastroenterology;;  . BREAST SURGERY    . COLONOSCOPY WITH PROPOFOL N/A 11/05/2017   Procedure: COLONOSCOPY WITH PROPOFOL;  Surgeon: Yetta Flock, MD;  Location: [facility];  Service: Gastroenterology;  Laterality: N/A;  spot tattoo   . ESOPHAGOGASTRODUODENOSCOPY (EGD) WITH PROPOFOL N/A 11/05/2017   Procedure: ESOPHAGOGASTRODUODENOSCOPY (EGD) WITH PROPOFOL;  Surgeon: Yetta Flock, MD;  Location: [facility];  Service: Gastroenterology;  Laterality: N/A;  APC  . SUBMUCOSAL INJECTION  11/05/2017   Procedure: SUBMUCOSAL INJECTION;  Surgeon: Yetta Flock, MD;  Location: [facility];  Service: Gastroenterology;;    Allergies as of 11/18/2017      Reactions   Bee Venom Swelling   Swelling at site of sting   Ceftin [cefuroxime Axetil] Other (See Comments)   Made the patient "feel weird"   Other    PT is a Jehovah Witness and wants no blood products.   Spider Antivenin [black Widow Spider Antivenin (l.mactans)] Swelling, Other (See Comments)   Pain and swelling at site of bite      Medication List        Accurate as of 11/18/17 11:27 AM. Always use your most recent med list.          acetaminophen 325 MG tablet Commonly known as:  TYLENOL Take 650 mg by mouth every 6 (six) hours as needed.   diclofenac sodium 1 % Gel Commonly known as:  VOLTAREN Apply 2 g topically 4 (four) times daily.   feeding supplement (ENSURE ENLIVE) Liqd Take 237 mLs by mouth 2 (two) times daily between meals.   multivitamin tablet Take 1 tablet by mouth daily.   pantoprazole 40 MG tablet Commonly known as:  PROTONIX Take 1 tablet (40 mg total) by mouth daily.       No orders of the defined types were placed in this encounter.   Immunization History  Administered Date(s) Administered  . Influenza Split 02/18/2011, 03/19/2012  . Influenza Whole 03/09/2009, 02/18/2010  .  Influenza, High Dose Seasonal PF 03/21/2016, 03/27/2017  . Influenza,inj,Quad PF,6+ Mos 03/04/2013, 02/01/2014, 02/01/2015  . Pneumococcal Conjugate-13 08/10/2015  . Pneumococcal Polysaccharide-23 12/31/2004, 09/17/2016  . Tdap 04/20/2017    Social History   Tobacco Use  . Smoking status: Former Research scientist (life sciences)  . Smokeless tobacco: Never Used  . Tobacco comment: Not sure when; many, many years   Substance Use Topics  . Alcohol use: No    Alcohol/week: 0.0 oz    Family history is   Family History  Problem Relation Age of Onset  . Hypertension Mother   . Hypertension Father   . Hypertension Other       Review of Systems  DATA OBTAINED: from patient-is adamant that she wants shots to improve her hemoglobin; per admission patient was admitted based on the fact that she would not receive any more Epogen per family wishes; patient gets her Epogen at her oncology office, nurse- no acute concerns GENERAL:  no fevers, fatigue, appetite changes SKIN: No itching, or rash EYES: No eye pain, redness, discharge EARS: No earache, tinnitus, change in hearing NOSE: No congestion, drainage or bleeding  MOUTH/THROAT: No mouth or tooth pain, No sore throat  RESPIRATORY: No cough, wheezing, SOB CARDIAC: No chest pain, palpitations, lower extremity edema  GI: No abdominal pain, No N/V/D or constipation, No heartburn or reflux  GU: No dysuria, frequency or urgency, or incontinence  MUSCULOSKELETAL: No unrelieved bone/joint pain NEUROLOGIC: No headache, dizziness or focal weakness PSYCHIATRIC: Patient describes a feeling of dread and fear, that something is wrong, may be someone is there and it makes her afraid  Vitals:   11/18/17 1123  BP: (!) 149/78  Pulse: 92  Resp: 20  Temp: 99 F (37.2 C)    SpO2 Readings from Last 1 Encounters:  11/16/17 98%   Body mass index is 29.76 kg/m.     Physical Exam  GENERAL APPEARANCE: Alert, conversant,  No acute distress.  SKIN: No diaphoresis  rash HEAD: Normocephalic, atraumatic  EYES: Conjunctiva/lids clear. Pupils round, reactive. EOMs intact.  EARS: External exam WNL, canals clear. Hearing grossly normal.  NOSE: No deformity or discharge.  MOUTH/THROAT: Lips w/o lesions  RESPIRATORY: Breathing is even, unlabored. Lung sounds are clear   CARDIOVASCULAR: Heart RRR no murmurs, rubs or gallops. No peripheral edema.   GASTROINTESTINAL: Abdomen is soft, non-tender, not distended w/ normal bowel sounds. GENITOURINARY: Bladder non tender, not distended  MUSCULOSKELETAL: No abnormal joints or musculature NEUROLOGIC:  Cranial nerves 2-12 grossly intact. Moves all extremities  PSYCHIATRIC: Mood and affect with very mild but present paranoia, no behavioral issues  Patient Active Problem List   Diagnosis Date Noted  . Goals of care, counseling/discussion 11/12/2017  . Colonic mass   . Anemia 11/03/2017  . Insomnia 06/01/2017  . Contusion of left leg 04/20/2017  . Sore in nose 06/03/2016  . Cough 05/22/2016  . HLD (hyperlipidemia)   . Cellulitis 12/24/2015  . Cellulitis of left upper extremity 12/23/2015  . Well adult exam 11/19/2015  . Loss of weight 10/12/2015  . Normocytic anemia 05/07/2015  . Multiple myeloma ([place]) 04/03/2015  . Arthralgia 02/01/2015  . Right wrist pain 12/01/2014  . Arm pain, left 11/22/2014  . No blood products 03/11/2014  . Right knee pain 07/26/2013  . Chest pain 04/26/2013  . Left ankle swelling 03/04/2013  . Clostridium difficile diarrhea 10/22/2012  . Right hand pain 09/08/2012  . Elbow pain, right 03/15/2012  . Knee pain, left 10/02/2011  . Hypercalcemia 10/02/2011  . Hyperglycemia 08/08/2011  . Neck pain, chronic 05/05/2011  . Knee pain, bilateral 02/18/2011  . Fatigue 01/03/2011  . Bronchitis, acute, with bronchospasm 12/11/2010  . Wheezing 11/28/2010  . Preventative health care 11/28/2010  . Cerebral artery occlusion with cerebral infarction ([place]) 01/04/2010  . CVA (cerebral vascular  accident) ([place]) 01/04/2010  . DIZZINESS 12/26/2009  . ATAXIA 12/26/2009  . PARESTHESIA 12/26/2009  . Morbid obesity due to excess calories ([place]) 09/07/2009  . ONYCHOMYCOSIS 03/09/2009  . TOBACCO USE, QUIT 03/09/2009  . CANDIDIASIS, VAGINAL 09/08/2008  . CERUMEN IMPACTION 06/05/2008  . Headache(784.0) 06/05/2008  . Epistaxis 06/05/2008  . Anxiety state 03/05/2007  . BEE STING 03/05/2007  . Dyslipidemia 12/08/2006  . Essential hypertension 12/08/2006  . Osteoarthritis 12/08/2006      Labs reviewed: Basic Metabolic Panel:    Component Value Date/Time   NA 133 (L) 11/16/2017 0508   NA 140 12/11/2015 1002   K 3.0 (L) 11/16/2017 0508   K 3.8 12/11/2015 1002   CL 107 11/16/2017 0508   CL 107 10/08/2015 0956   CO2 24 11/16/2017 0508   CO2 28 12/11/2015 1002   GLUCOSE 90 11/16/2017 0508   GLUCOSE 78 12/11/2015  1002   GLUCOSE 103 10/08/2015 0956   BUN 21 (H) 11/16/2017 0508   BUN 12.6 12/11/2015 1002   CREATININE 1.52 (H) 11/16/2017 0508   CREATININE 0.8 12/11/2015 1002   CALCIUM 8.4 (L) 11/16/2017 0508   CALCIUM 9.2 12/11/2015 1002   PROT 11.6 (H) 11/13/2017 0343   PROT 7.0 12/11/2015 1002   PROT 7.7 12/11/2015 1002   ALBUMIN 2.2 (L) 11/13/2017 0343   ALBUMIN 3.5 12/11/2015 1002   AST 13 (L) 11/13/2017 0343   AST 17 12/11/2015 1002   ALT 11 (L) 11/13/2017 0343   ALT 11 12/11/2015 1002   ALKPHOS 47 11/13/2017 0343   ALKPHOS 85 12/11/2015 1002   BILITOT 1.1 11/13/2017 0343   BILITOT 1.30 (H) 12/11/2015 1002   GFRNONAA 30 (L) 11/16/2017 0508   GFRAA 34 (L) 11/16/2017 0508    Recent Labs    11/14/17 0351 11/15/17 0424 11/16/17 0508  NA 129* 129* 133*  K 3.6 3.2* 3.0*  CL 99* 102 107  CO2 '26 26 24  '$ GLUCOSE 111* 96 90  BUN 32* 30* 21*  CREATININE 1.86* 1.73* 1.52*  CALCIUM 10.3 9.0 8.4*   Liver Function Tests: Recent Labs    11/11/17 0707 11/12/17 0340 11/13/17 0343  AST 14* 12* 13*  ALT 11* 10* 11*  ALKPHOS 44 45 47  BILITOT 0.9 1.2 1.1  PROT 11.1*  11.4* 11.6*  ALBUMIN 2.3* 2.3* 2.2*   No results for input(s): LIPASE, AMYLASE in the last 8760 hours. No results for input(s): AMMONIA in the last 8760 hours. CBC: Recent Labs    11/10/17 0725 11/11/17 0707 11/12/17 0340 11/12/17 1249 11/16/17 0508  WBC 5.4 6.1 6.4  --   --   NEUTROABS 2.9 3.4 3.4  --   --   HGB 6.2* 6.1* 6.1* 6.2* 5.7*  HCT 19.4* 18.8* 19.3* 19.2* 17.9*  MCV 103.7* 103.3* 103.8*  --   --   PLT 162 152 180  --   --    Lipid No results for input(s): CHOL, HDL, LDLCALC, TRIG in the last 8760 hours.  Cardiac Enzymes: No results for input(s): CKTOTAL, CKMB, CKMBINDEX, TROPONINI in the last 8760 hours. BNP: No results for input(s): BNP in the last 8760 hours. No results found for: [facility] Lab Results  Component Value Date   HGBA1C 5.7 08/11/2011   Lab Results  Component Value Date   TSH 0.58 03/21/2016   Lab Results  Component Value Date   GEXBMWUX32 440 11/04/2017   Lab Results  Component Value Date   FOLATE 24.1 11/04/2017   Lab Results  Component Value Date   IRON 47 11/04/2017   TIBC 261 11/04/2017   FERRITIN 17 11/04/2017    Imaging and Procedures obtained prior to SNF admission: Ct Angio Head W Or Wo Contrast  Result Date: 11/03/2017 CLINICAL DATA:  Dizziness, headache, imbalance, and generalized weakness for 1 month. EXAM: CT ANGIOGRAPHY HEAD AND NECK TECHNIQUE: Multidetector CT imaging of the head and neck was performed using the standard protocol during bolus administration of intravenous contrast. Multiplanar CT image reconstructions and MIPs were obtained to evaluate the vascular anatomy. Carotid stenosis measurements (when applicable) are obtained utilizing NASCET criteria, using the distal internal carotid diameter as the denominator. CONTRAST:  38m ISOVUE-370 IOPAMIDOL (ISOVUE-370) INJECTION 76% COMPARISON:  Neck CTA 04/04/2011.  Brain MRI 01/02/2010. FINDINGS: CT HEAD FINDINGS Brain: There is no evidence of acute infarct,  intracranial hemorrhage, mass, midline shift, or extra-axial fluid collection. The ventricles and sulci are normal  for age. Patchy cerebral white matter hypodensities are nonspecific but compatible with chronic small vessel ischemic disease, mild-to-moderate for age and similar in distribution to the T2 hyperintensities on the prior MRI. Vascular: Calcified atherosclerosis at the skull base. No hyperdense vessel. Skull: No fracture or focal osseous lesion. Sinuses: Visualized paranasal sinuses and mastoid air cells are clear. Orbits: Unremarkable. Review of the MIP images confirms the above findings CTA NECK FINDINGS Aortic arch: There is a standard 3 vessel aortic arch. Arch vessel origins are widely patent. Right carotid system: Patent without evidence of stenosis or dissection. Mild atherosclerotic plaque in the carotid bulb. Tortuous proximal common carotid artery and mid cervical ICA. Left carotid system: Patent without evidence of stenosis or dissection. Mild calcified plaque at the carotid bifurcation. Vertebral arteries: Patent without evidence of stenosis or dissection. Strongly dominant left vertebral artery with focal nonstenotic calcified plaque at its origin. Skeleton: Chronically advanced disc degeneration at C6-7 greater than C5-6. Moderate to advanced cervical facet arthrosis with unchanged grade 1 anterolisthesis of C4 on C5. Other neck: Similar appearance of left thyroid goiter which posteriorly displaces the proximal left common carotid and left subclavian arteries. Upper chest: Clear lung apices. Review of the MIP images confirms the above findings CTA HEAD FINDINGS Anterior circulation: The internal carotid arteries are patent from skull base to carotid termini with mild nonstenotic siphon atherosclerosis bilaterally. ACAs and MCAs are patent without evidence of proximal branch occlusion or flow limiting proximal stenosis. There may be a scratched of there may be an incidental fenestration of  the distal right A1 segment, a normal variant. No aneurysm is identified. Posterior circulation: The intracranial vertebral arteries are patent to the basilar without focal stenosis. The right vertebral artery is particularly hypoplastic distal to the PICA origin. Patent PICA, AICA, and SCA origins are identified bilaterally. The basilar artery is widely patent. There are patent posterior communicating arteries bilaterally, right slightly larger than left. PCAs are patent without evidence of significant stenosis. No aneurysm is identified. Venous sinuses: Patent. Anatomic variants: None of significance. Delayed phase: No abnormal enhancement. Review of the MIP images confirms the above findings IMPRESSION: 1. No evidence of acute intracranial abnormality. 2. Mild-to-moderate chronic small vessel ischemic disease. 3. Mild intracranial and cervical atherosclerosis without major vessel occlusion or significant stenosis. Electronically Signed   By: Logan Bores M.D.   On: 11/03/2017 17:12   Ct Angio Neck W And/or Wo Contrast  Result Date: 11/03/2017 CLINICAL DATA:  Dizziness, headache, imbalance, and generalized weakness for 1 month. EXAM: CT ANGIOGRAPHY HEAD AND NECK TECHNIQUE: Multidetector CT imaging of the head and neck was performed using the standard protocol during bolus administration of intravenous contrast. Multiplanar CT image reconstructions and MIPs were obtained to evaluate the vascular anatomy. Carotid stenosis measurements (when applicable) are obtained utilizing NASCET criteria, using the distal internal carotid diameter as the denominator. CONTRAST:  65m ISOVUE-370 IOPAMIDOL (ISOVUE-370) INJECTION 76% COMPARISON:  Neck CTA 04/04/2011.  Brain MRI 01/02/2010. FINDINGS: CT HEAD FINDINGS Brain: There is no evidence of acute infarct, intracranial hemorrhage, mass, midline shift, or extra-axial fluid collection. The ventricles and sulci are normal for age. Patchy cerebral white matter hypodensities are  nonspecific but compatible with chronic small vessel ischemic disease, mild-to-moderate for age and similar in distribution to the T2 hyperintensities on the prior MRI. Vascular: Calcified atherosclerosis at the skull base. No hyperdense vessel. Skull: No fracture or focal osseous lesion. Sinuses: Visualized paranasal sinuses and mastoid air cells are clear. Orbits: Unremarkable. Review of the  MIP images confirms the above findings CTA NECK FINDINGS Aortic arch: There is a standard 3 vessel aortic arch. Arch vessel origins are widely patent. Right carotid system: Patent without evidence of stenosis or dissection. Mild atherosclerotic plaque in the carotid bulb. Tortuous proximal common carotid artery and mid cervical ICA. Left carotid system: Patent without evidence of stenosis or dissection. Mild calcified plaque at the carotid bifurcation. Vertebral arteries: Patent without evidence of stenosis or dissection. Strongly dominant left vertebral artery with focal nonstenotic calcified plaque at its origin. Skeleton: Chronically advanced disc degeneration at C6-7 greater than C5-6. Moderate to advanced cervical facet arthrosis with unchanged grade 1 anterolisthesis of C4 on C5. Other neck: Similar appearance of left thyroid goiter which posteriorly displaces the proximal left common carotid and left subclavian arteries. Upper chest: Clear lung apices. Review of the MIP images confirms the above findings CTA HEAD FINDINGS Anterior circulation: The internal carotid arteries are patent from skull base to carotid termini with mild nonstenotic siphon atherosclerosis bilaterally. ACAs and MCAs are patent without evidence of proximal branch occlusion or flow limiting proximal stenosis. There may be a scratched of there may be an incidental fenestration of the distal right A1 segment, a normal variant. No aneurysm is identified. Posterior circulation: The intracranial vertebral arteries are patent to the basilar without focal  stenosis. The right vertebral artery is particularly hypoplastic distal to the PICA origin. Patent PICA, AICA, and SCA origins are identified bilaterally. The basilar artery is widely patent. There are patent posterior communicating arteries bilaterally, right slightly larger than left. PCAs are patent without evidence of significant stenosis. No aneurysm is identified. Venous sinuses: Patent. Anatomic variants: None of significance. Delayed phase: No abnormal enhancement. Review of the MIP images confirms the above findings IMPRESSION: 1. No evidence of acute intracranial abnormality. 2. Mild-to-moderate chronic small vessel ischemic disease. 3. Mild intracranial and cervical atherosclerosis without major vessel occlusion or significant stenosis. Electronically Signed   By: Logan Bores M.D.   On: 11/03/2017 17:12     Not all labs, radiology exams or other studies done during hospitalization come through on my EPIC note; however they are reviewed by me.    Assessment and Plan  Symptomatic anemia, multifactorial/colonic mass- patient is Jehovah's Witness, declined any PRBC transfusion, her hemoglobin remained stable between 6.2 and 5.7 iron studies showed iron deficiency anemia and patient received IV iron and erythropoietin.  Further work-up with upper endoscopy showed a single nonbleeding angioplastic lesion in the stomach treated with argon plasma coagulation and colonoscopy showed a polypoid mass in the rectosigmoid colon along with internal hemorrhoids, the biopsy was positive for a tubular villous adenoma of the colon.  Patient was found to be high risk for bleeding for surgical or endoscopic procedure SNF -patient is admitted for OT/PT; it is unclear at this time whether patient should be or will be receiving Epogen, will need to speak to patient's responsible party for this decision can be made.  Multiple myeloma/hypercalcemia-patient and family have decided to decline further chemotherapy,  therefore patient received biphosphonate therapy with improvement of her hypercalcemia; discharge calcium is 8.4 SNF -follow-up BMP  Acute kidney injury/hyponatremia/hypokalemia- consider prerenal acute renal failure, patient received IV fluids with improvement of kidney function and discharge creatinine was 1.5 to, sodium 133 potassium 3.0-patient received potassium chloride before discharge SNF -follow-up BMP  Metabolic encephalopathy with delirium-clinically improved with supportive medical therapy  Hypertension SNF -blood pressure medications were held while patient was in the hospital to be: If patient's blood  pressure continues to be elevated we will restart her amlodipine 5 mg daily  Hyperlipidemia- patient with poor prognosis, short life expectancy and statins were discontinued  Right knee pain SNF -tinea with diclofenac gel for pain  GERD/angiodysplastic lesion in stomach SNF -continue Protonix 40 mg daily  Psychoses SNF -patient is vague feelings of unease have some psychotic features, including paranoia mild; will start Seroquel 25 mg nightly monitor response   Time spent greater than 45 minutes;> 50% of time with patient was spent reviewing records, labs, tests and studies, counseling and developing plan of care  Webb Silversmith D. Taylor Coil, MD

## 2017-11-19 ENCOUNTER — Telehealth: Payer: Self-pay

## 2017-11-19 NOTE — Telephone Encounter (Signed)
Copied from [facility] #[id number]. Topic: General - Other >> Nov 18, 2017  8:06 AM Yvette Rack wrote: Reason for CRM: pt states that she is at [facility] room 110 and that she would like to speak with Plotnikov only she states that it want take up to much of his time wouldn't say why you can reach her at [phone]  >> Nov 18, 2017  8:10 AM Yvette Rack wrote: Pt states that she is in rehab because she can't walk

## 2017-11-20 NOTE — Telephone Encounter (Signed)
Per chart pt never called back to schedule hosp f./u appt. She is scheduled to see MD on 7/19 for 3 month f/u. Closing encounter.Marland KitchenJohny Chess

## 2017-11-21 ENCOUNTER — Encounter: Payer: Self-pay | Admitting: Internal Medicine

## 2017-11-21 DIAGNOSIS — IMO0001 Reserved for inherently not codable concepts without codable children: Secondary | ICD-10-CM | POA: Insufficient documentation

## 2017-11-21 DIAGNOSIS — F29 Unspecified psychosis not due to a substance or known physiological condition: Secondary | ICD-10-CM | POA: Insufficient documentation

## 2017-11-21 DIAGNOSIS — K31819 Angiodysplasia of stomach and duodenum without bleeding: Secondary | ICD-10-CM | POA: Insufficient documentation

## 2017-11-21 DIAGNOSIS — E876 Hypokalemia: Secondary | ICD-10-CM | POA: Insufficient documentation

## 2017-11-21 DIAGNOSIS — N179 Acute kidney failure, unspecified: Secondary | ICD-10-CM | POA: Insufficient documentation

## 2017-11-21 DIAGNOSIS — Z789 Other specified health status: Secondary | ICD-10-CM | POA: Insufficient documentation

## 2017-11-21 DIAGNOSIS — K219 Gastro-esophageal reflux disease without esophagitis: Secondary | ICD-10-CM | POA: Insufficient documentation

## 2017-11-21 DIAGNOSIS — D509 Iron deficiency anemia, unspecified: Secondary | ICD-10-CM | POA: Insufficient documentation

## 2017-11-21 DIAGNOSIS — E871 Hypo-osmolality and hyponatremia: Secondary | ICD-10-CM | POA: Insufficient documentation

## 2017-11-21 DIAGNOSIS — G9341 Metabolic encephalopathy: Secondary | ICD-10-CM | POA: Insufficient documentation

## 2017-11-23 DIAGNOSIS — C189 Malignant neoplasm of colon, unspecified: Secondary | ICD-10-CM | POA: Diagnosis not present

## 2017-11-23 DIAGNOSIS — D649 Anemia, unspecified: Secondary | ICD-10-CM | POA: Diagnosis not present

## 2017-11-23 DIAGNOSIS — E876 Hypokalemia: Secondary | ICD-10-CM | POA: Diagnosis not present

## 2017-11-23 DIAGNOSIS — B952 Enterococcus as the cause of diseases classified elsewhere: Secondary | ICD-10-CM | POA: Diagnosis not present

## 2017-11-23 DIAGNOSIS — F4320 Adjustment disorder, unspecified: Secondary | ICD-10-CM | POA: Diagnosis not present

## 2017-11-23 DIAGNOSIS — D508 Other iron deficiency anemias: Secondary | ICD-10-CM | POA: Diagnosis not present

## 2017-11-23 DIAGNOSIS — K922 Gastrointestinal hemorrhage, unspecified: Secondary | ICD-10-CM | POA: Diagnosis not present

## 2017-11-23 DIAGNOSIS — R443 Hallucinations, unspecified: Secondary | ICD-10-CM | POA: Diagnosis not present

## 2017-11-23 DIAGNOSIS — Z789 Other specified health status: Secondary | ICD-10-CM | POA: Diagnosis not present

## 2017-11-23 DIAGNOSIS — F419 Anxiety disorder, unspecified: Secondary | ICD-10-CM | POA: Diagnosis not present

## 2017-11-23 DIAGNOSIS — N189 Chronic kidney disease, unspecified: Secondary | ICD-10-CM | POA: Diagnosis not present

## 2017-11-23 DIAGNOSIS — N390 Urinary tract infection, site not specified: Secondary | ICD-10-CM | POA: Diagnosis not present

## 2017-11-23 DIAGNOSIS — M6281 Muscle weakness (generalized): Secondary | ICD-10-CM | POA: Diagnosis not present

## 2017-11-23 DIAGNOSIS — R2681 Unsteadiness on feet: Secondary | ICD-10-CM | POA: Diagnosis not present

## 2017-11-23 DIAGNOSIS — E871 Hypo-osmolality and hyponatremia: Secondary | ICD-10-CM | POA: Diagnosis not present

## 2017-11-23 DIAGNOSIS — M25571 Pain in right ankle and joints of right foot: Secondary | ICD-10-CM | POA: Diagnosis not present

## 2017-11-23 DIAGNOSIS — E785 Hyperlipidemia, unspecified: Secondary | ICD-10-CM | POA: Diagnosis not present

## 2017-11-23 DIAGNOSIS — K219 Gastro-esophageal reflux disease without esophagitis: Secondary | ICD-10-CM | POA: Diagnosis not present

## 2017-11-23 DIAGNOSIS — N179 Acute kidney failure, unspecified: Secondary | ICD-10-CM | POA: Diagnosis not present

## 2017-11-23 DIAGNOSIS — M1711 Unilateral primary osteoarthritis, right knee: Secondary | ICD-10-CM | POA: Diagnosis not present

## 2017-11-23 DIAGNOSIS — K31819 Angiodysplasia of stomach and duodenum without bleeding: Secondary | ICD-10-CM | POA: Diagnosis not present

## 2017-11-23 DIAGNOSIS — K639 Disease of intestine, unspecified: Secondary | ICD-10-CM | POA: Diagnosis not present

## 2017-11-23 DIAGNOSIS — R41841 Cognitive communication deficit: Secondary | ICD-10-CM | POA: Diagnosis not present

## 2017-11-23 DIAGNOSIS — I10 Essential (primary) hypertension: Secondary | ICD-10-CM | POA: Diagnosis not present

## 2017-11-23 DIAGNOSIS — G9341 Metabolic encephalopathy: Secondary | ICD-10-CM | POA: Diagnosis not present

## 2017-11-23 DIAGNOSIS — Z79899 Other long term (current) drug therapy: Secondary | ICD-10-CM | POA: Diagnosis not present

## 2017-11-23 DIAGNOSIS — C9000 Multiple myeloma not having achieved remission: Secondary | ICD-10-CM | POA: Diagnosis present

## 2017-11-25 ENCOUNTER — Inpatient Hospital Stay: Payer: PRIVATE HEALTH INSURANCE | Attending: Hematology & Oncology

## 2017-11-25 VITALS — BP 122/63 | HR 100 | Resp 18

## 2017-11-25 DIAGNOSIS — Z789 Other specified health status: Secondary | ICD-10-CM

## 2017-11-25 DIAGNOSIS — D649 Anemia, unspecified: Secondary | ICD-10-CM | POA: Diagnosis not present

## 2017-11-25 DIAGNOSIS — Z79899 Other long term (current) drug therapy: Secondary | ICD-10-CM | POA: Diagnosis not present

## 2017-11-25 DIAGNOSIS — K922 Gastrointestinal hemorrhage, unspecified: Secondary | ICD-10-CM | POA: Diagnosis not present

## 2017-11-25 DIAGNOSIS — N189 Chronic kidney disease, unspecified: Secondary | ICD-10-CM

## 2017-11-25 DIAGNOSIS — IMO0001 Reserved for inherently not codable concepts without codable children: Secondary | ICD-10-CM

## 2017-11-25 DIAGNOSIS — C9000 Multiple myeloma not having achieved remission: Secondary | ICD-10-CM | POA: Insufficient documentation

## 2017-11-25 DIAGNOSIS — D631 Anemia in chronic kidney disease: Secondary | ICD-10-CM

## 2017-11-25 MED ORDER — EPOETIN ALFA 20000 UNIT/ML IJ SOLN: 40000.0000 [IU] | Freq: Once | INTRAMUSCULAR | Status: DC

## 2017-11-25 MED ORDER — EPOETIN ALFA 40000 UNIT/ML IJ SOLN
INTRAMUSCULAR | Status: AC
  Filled 2017-11-25: qty 1

## 2017-11-30 ENCOUNTER — Other Ambulatory Visit: Payer: Self-pay | Admitting: *Deleted

## 2017-11-30 NOTE — Patient Outreach (Signed)
[facility]) Care Management  11/30/2017  Mindy Taylor Esperanza 02-21-1929 [id number]   Met with Mindy Taylor, SW at facility regarding this patient.  She reports that discharge plan is up in the air at this time. She reports that patient had a PC consult today. She has an ortho appointment on Thursday She wants to return home but depends on how she is ambulating as she lives alone.  Met with patient and her niece at the facility.  Patient sitting in w/c, she had a kleenex and had an active nosebleed while RNCM was visiting.  Patient reports her goal is to go home, however she will not be able to go until she can ambulate. She does live in a handicapped apartment and could maneuver in her wheelchair if she needed to,  However for "quality of life" she wants to be able to ambulate.  She is to have a repeat CBC later this week as she has severe anemia, she is Jehovah's Witness and does not any blood transfusions.   She reports that she has no children but has supportive family in her niece and her niece's spouse and a supportive group of friends and church friends.  She does not feel she will lack transportation due to strong support stating "they are asking me what I need" and "ready to help" She states she manages her own medications and gets her medications from [facility].   RNCM reviewed [facility] care management program services and the difference in Care management and home care.  RNCM left a packet of information. Will visit with patient again before her discharge. Plan to coordinate with [place] Laymond Purser, RN, BSN, [place] [phone]) Business Cell  [phone]) Toll Free Office

## 2017-12-01 ENCOUNTER — Non-Acute Institutional Stay: Payer: Medicare Other | Admitting: *Deleted

## 2017-12-01 ENCOUNTER — Non-Acute Institutional Stay: Payer: Medicare Other | Admitting: Licensed Clinical Social Worker

## 2017-12-01 DIAGNOSIS — Z515 Encounter for palliative care: Secondary | ICD-10-CM

## 2017-12-01 NOTE — Progress Notes (Signed)
COMMUNITY PALLIATIVE CARE SW NOTE  PATIENT NAME: Mindy Taylor DOB: 08/02/1928 MRN: [id number]  PRIMARY CARE PROVIDER: Cassandria Anger, MD  RESPONSIBLE PARTY:  Acct ID - Guarantor Home Phone Work Phone Relationship Acct Type  [id number] - [place]* [phone]  Self P/F     [address] , [place], [place] [address]     PLAN OF CARE and INTERVENTIONS:             1. GOALS OF CARE/ ADVANCE CARE PLANNING:  Patient wishes to return home.  Patient appears to be a full code. 2. SOCIAL/EMOTIONAL/SPIRITUAL ASSESSMENT/ INTERVENTIONS:  SW, Palliative Care RN, Daryl Eastern, and Palliative Care NP, Stephanie Martinique, met with patient, her niece, Marisah, and Starlynn's husband, Ron.  Family appeared to want a short visit with the Palliative Care team today as they stated they spent an extended period of time with Gonzella Lex, NP, yesterday.  Patient is retired and is a Restaurant manager, fast food.  She declines any blood transfusions.  She has a handicapped apartment she wishes to return to, but reports she needs to ambulate.  Patient is a widow, with no mention of children in her chart.  Patient displayed a bright affect and was open to the team returning for a more extended visit.   3. PATIENT/CAREGIVER EDUCATION/ COPING:  Patient expressed her feelings openly. 4. PERSONAL EMERGENCY PLAN:  Per facility protocol. 5. COMMUNITY RESOURCES COORDINATION/ HEALTH CARE NAVIGATION:  None per family. 6. FINANCIAL/LEGAL CONCERNS/INTERVENTIONS:  None per family.     SOCIAL HX:  Social History   Tobacco Use  . Smoking status: Former Research scientist (life sciences)  . Smokeless tobacco: Never Used  . Tobacco comment: Not sure when; many, many years   Substance Use Topics  . Alcohol use: No    Alcohol/week: 0.0 oz    CODE STATUS:  Full Code.  ADVANCED DIRECTIVES: Living Will, HCPOA MOST FORM COMPLETE:  No HOSPICE EDUCATION PROVIDED: No  PPS:  Patient was in a w/c. Duration of visit and documentation:  30 minutes.     Creola Corn  Render Marley, LCSW

## 2017-12-02 ENCOUNTER — Telehealth: Payer: Self-pay

## 2017-12-02 NOTE — Telephone Encounter (Signed)
Received call from Olu with Dr Sheppard Coil at [facility] stating pt's Hgb is 5.5 today. Patient is a Jehovah's witness and declines blood products. Dr Sheppard Coil questions how Dr Marin Olp would like to proceed.  Per Dr Marin Olp, we can try Procrit 40000 units WEEKLY. Olu aware that pt can come in for injection tomorrow at 1230. Verbalizes understanding and repeats back instructions. Note to scheduler to add on for tomorrow. dph

## 2017-12-03 ENCOUNTER — Inpatient Hospital Stay: Payer: PRIVATE HEALTH INSURANCE

## 2017-12-03 VITALS — BP 125/69 | HR 105 | Temp 98.6°F | Resp 20

## 2017-12-03 DIAGNOSIS — IMO0001 Reserved for inherently not codable concepts without codable children: Secondary | ICD-10-CM

## 2017-12-03 DIAGNOSIS — M25571 Pain in right ankle and joints of right foot: Secondary | ICD-10-CM | POA: Diagnosis not present

## 2017-12-03 DIAGNOSIS — C9000 Multiple myeloma not having achieved remission: Secondary | ICD-10-CM | POA: Diagnosis not present

## 2017-12-03 DIAGNOSIS — Z789 Other specified health status: Secondary | ICD-10-CM

## 2017-12-03 DIAGNOSIS — Z79899 Other long term (current) drug therapy: Secondary | ICD-10-CM | POA: Diagnosis not present

## 2017-12-03 DIAGNOSIS — D631 Anemia in chronic kidney disease: Secondary | ICD-10-CM

## 2017-12-03 DIAGNOSIS — K922 Gastrointestinal hemorrhage, unspecified: Secondary | ICD-10-CM | POA: Diagnosis not present

## 2017-12-03 DIAGNOSIS — D649 Anemia, unspecified: Secondary | ICD-10-CM | POA: Diagnosis not present

## 2017-12-03 DIAGNOSIS — N189 Chronic kidney disease, unspecified: Secondary | ICD-10-CM

## 2017-12-03 DIAGNOSIS — M1711 Unilateral primary osteoarthritis, right knee: Secondary | ICD-10-CM | POA: Diagnosis not present

## 2017-12-03 MED ORDER — EPOETIN ALFA 40000 UNIT/ML IJ SOLN
INTRAMUSCULAR | Status: AC
  Filled 2017-12-03: qty 1

## 2017-12-03 MED ORDER — EPOETIN ALFA 40000 UNIT/ML IJ SOLN
40000.0000 [IU] | Freq: Once | INTRAMUSCULAR | Status: AC
  Administered 2017-12-03: 40000 [IU] via SUBCUTANEOUS

## 2017-12-03 NOTE — Progress Notes (Signed)
COMMUNITY PALLIATIVE CARE RN NOTE  PATIENT NAME: Mindy Taylor DOB: Oct 14, 1928 MRN: [id number]  PRIMARY CARE PROVIDER: Cassandria Anger, MD  RESPONSIBLE PARTY:  Acct ID - Guarantor Home Phone Work Phone Relationship Acct Type  [id number] - [place]* [phone]  Self P/F     [address] , [place], [place] [address]    PLAN OF CARE and INTERVENTION:  1. ADVANCE CARE PLANNING/GOALS OF CARE: Avoid hospitalizations 2. PATIENT/CAREGIVER EDUCATION: Explained Palliative Care Services and introduced team 3. DISEASE STATUS: Joint visit made with [place] and Stephanie Martinique NP. Niece Kasi present along with her husband Ron. Shatira stated that she had an extended, thorough visit with Palliative NP Gonzella Lex yesterday so wanted a short visit today. Family agreeable in allowing team to visit on a monthly basis. Patient sitting up in a wheelchair upon arrival. Pleasant and able to answer simple questions and denies pain. Niece denies any patient issues today.    HISTORY OF PRESENT ILLNESS: This is a 84 yo female who currently resides at [facility]. Patient has a MD visit scheduled for 12/03/17 for an Epoetin infusion. Patient is a Restaurant manager, fast food. Palliative Care to follow patient to assess overall condition and assist with any symptom management needs.    CODE STATUS: FULL Code  ADVANCED DIRECTIVES: Y (Living Will and HCPOA) MOST FORM: no PPS: 30%   PHYSICAL EXAM:    LUNGS: No evidence of dyspnea noted CARDIAC: Not assessed SKIN: Exposed skin dry and intact  NEURO: Alert and oriented to person and place, able to answer simple questions and make needs known, generalized weakness   (Duration of visit and documentation 30 minutes)    Daryl Eastern, RN, BSN

## 2017-12-03 NOTE — Patient Instructions (Signed)
Epoetin Alfa injection °What is this medicine? °EPOETIN ALFA (e POE e tin AL fa) helps your body make more red blood cells. This medicine is used to treat anemia caused by chronic kidney failure, cancer chemotherapy, or HIV-therapy. It may also be used before surgery if you have anemia. °This medicine may be used for other purposes; ask your health care provider or pharmacist if you have questions. °COMMON BRAND NAME(S): Epogen, Procrit °What should I tell my health care provider before I take this medicine? °They need to know if you have any of these conditions: °-blood clotting disorders °-cancer patient not on chemotherapy °-cystic fibrosis °-heart disease, such as angina or heart failure °-hemoglobin level of 12 g/dL or greater °-high blood pressure °-low levels of folate, iron, or vitamin B12 °-seizures °-an unusual or allergic reaction to erythropoietin, albumin, benzyl alcohol, hamster proteins, other medicines, foods, dyes, or preservatives °-pregnant or trying to get pregnant °-breast-feeding °How should I use this medicine? °This medicine is for injection into a vein or under the skin. It is usually given by a health care professional in a hospital or clinic setting. °If you get this medicine at home, you will be taught how to prepare and give this medicine. Use exactly as directed. Take your medicine at regular intervals. Do not take your medicine more often than directed. °It is important that you put your used needles and syringes in a special sharps container. Do not put them in a trash can. If you do not have a sharps container, call your pharmacist or healthcare provider to get one. °A special MedGuide will be given to you by the pharmacist with each prescription and refill. Be sure to read this information carefully each time. °Talk to your pediatrician regarding the use of this medicine in children. While this drug may be prescribed for selected conditions, precautions do apply. °Overdosage: If you  think you have taken too much of this medicine contact a poison control center or emergency room at once. °NOTE: This medicine is only for you. Do not share this medicine with others. °What if I miss a dose? °If you miss a dose, take it as soon as you can. If it is almost time for your next dose, take only that dose. Do not take double or extra doses. °What may interact with this medicine? °Do not take this medicine with any of the following medications: °-darbepoetin alfa °This list may not describe all possible interactions. Give your health care provider a list of all the medicines, herbs, non-prescription drugs, or dietary supplements you use. Also tell them if you smoke, drink alcohol, or use illegal drugs. Some items may interact with your medicine. °What should I watch for while using this medicine? °Your condition will be monitored carefully while you are receiving this medicine. °You may need blood work done while you are taking this medicine. °What side effects may I notice from receiving this medicine? °Side effects that you should report to your doctor or health care professional as soon as possible: °-allergic reactions like skin rash, itching or hives, swelling of the face, lips, or tongue °-breathing problems °-changes in vision °-chest pain °-confusion, trouble speaking or understanding °-feeling faint or lightheaded, falls °-high blood pressure °-muscle aches or pains °-pain, swelling, warmth in the leg °-rapid weight gain °-severe headaches °-sudden numbness or weakness of the face, arm or leg °-trouble walking, dizziness, loss of balance or coordination °-seizures (convulsions) °-swelling of the ankles, feet, hands °-unusually weak or tired °  Side effects that usually do not require medical attention (report to your doctor or health care professional if they continue or are bothersome): °-diarrhea °-fever, chills (flu-like symptoms) °-headaches °-nausea, vomiting °-redness, stinging, or swelling at  site where injected °This list may not describe all possible side effects. Call your doctor for medical advice about side effects. You may report side effects to FDA at 1-800-FDA-1088. °Where should I keep my medicine? °Keep out of the reach of children. °Store in a refrigerator between 2 and 8 degrees C (36 and 46 degrees F). Do not freeze or shake. Throw away any unused portion if using a single-dose vial. Multi-dose vials can be kept in the refrigerator for up to 21 days after the initial dose. Throw away unused medicine. °NOTE: This sheet is a summary. It may not cover all possible information. If you have questions about this medicine, talk to your doctor, pharmacist, or health care provider. °© 2018 Elsevier/Gold Standard (2015-12-31 19:42:31) ° °

## 2017-12-07 NOTE — Telephone Encounter (Signed)
I called the pt. She said she did not call me

## 2017-12-08 ENCOUNTER — Non-Acute Institutional Stay: Payer: Medicare Other | Admitting: Internal Medicine

## 2017-12-08 ENCOUNTER — Non-Acute Institutional Stay (SKILLED_NURSING_FACILITY): Payer: Medicare Other | Admitting: Internal Medicine

## 2017-12-08 DIAGNOSIS — B952 Enterococcus as the cause of diseases classified elsewhere: Secondary | ICD-10-CM

## 2017-12-08 DIAGNOSIS — N390 Urinary tract infection, site not specified: Secondary | ICD-10-CM | POA: Diagnosis not present

## 2017-12-09 ENCOUNTER — Encounter: Payer: Self-pay | Admitting: Internal Medicine

## 2017-12-09 ENCOUNTER — Non-Acute Institutional Stay (SKILLED_NURSING_FACILITY): Payer: Medicare Other | Admitting: Internal Medicine

## 2017-12-09 DIAGNOSIS — E876 Hypokalemia: Secondary | ICD-10-CM | POA: Diagnosis not present

## 2017-12-09 DIAGNOSIS — K639 Disease of intestine, unspecified: Secondary | ICD-10-CM | POA: Diagnosis not present

## 2017-12-09 DIAGNOSIS — IMO0001 Reserved for inherently not codable concepts without codable children: Secondary | ICD-10-CM

## 2017-12-09 DIAGNOSIS — G8929 Other chronic pain: Secondary | ICD-10-CM

## 2017-12-09 DIAGNOSIS — G9341 Metabolic encephalopathy: Secondary | ICD-10-CM | POA: Diagnosis not present

## 2017-12-09 DIAGNOSIS — K31819 Angiodysplasia of stomach and duodenum without bleeding: Secondary | ICD-10-CM | POA: Diagnosis not present

## 2017-12-09 DIAGNOSIS — D508 Other iron deficiency anemias: Secondary | ICD-10-CM

## 2017-12-09 DIAGNOSIS — D631 Anemia in chronic kidney disease: Secondary | ICD-10-CM

## 2017-12-09 DIAGNOSIS — I10 Essential (primary) hypertension: Secondary | ICD-10-CM

## 2017-12-09 DIAGNOSIS — K6389 Other specified diseases of intestine: Secondary | ICD-10-CM

## 2017-12-09 DIAGNOSIS — N179 Acute kidney failure, unspecified: Secondary | ICD-10-CM

## 2017-12-09 DIAGNOSIS — E871 Hypo-osmolality and hyponatremia: Secondary | ICD-10-CM | POA: Diagnosis not present

## 2017-12-09 DIAGNOSIS — Z789 Other specified health status: Secondary | ICD-10-CM

## 2017-12-09 DIAGNOSIS — C9000 Multiple myeloma not having achieved remission: Secondary | ICD-10-CM

## 2017-12-09 DIAGNOSIS — N189 Chronic kidney disease, unspecified: Secondary | ICD-10-CM

## 2017-12-09 DIAGNOSIS — F23 Brief psychotic disorder: Secondary | ICD-10-CM

## 2017-12-09 DIAGNOSIS — K219 Gastro-esophageal reflux disease without esophagitis: Secondary | ICD-10-CM

## 2017-12-09 DIAGNOSIS — M25561 Pain in right knee: Secondary | ICD-10-CM

## 2017-12-09 NOTE — Progress Notes (Signed)
Location: [facility] Service:  SNF (31) Provider: Hennie Duos MD  PCP: Cassandria Anger, MD Patient Care Team: Cassandria Anger, MD as PCP - General Berle Mull, MD as Attending Physician (Family Medicine) [name], MD as Consulting Physician (Internal Medicine) [name], MD as Rounding Team (Internal Medicine)  Extended Emergency Contact Information Primary Emergency Contact: [name],Tyler Address: [address] [place]          [place], [place] [address] [name] of [place] Phone: [phone] Mobile Phone: [phone] Relation: Niece Secondary Emergency Contact: [name] [place] of [place] Phone: [phone] Relation: Other  Allergies  Allergen Reactions  . Bee Venom Swelling    Swelling at site of sting  . Ceftin [Cefuroxime Axetil] Other (See Comments)    Made the patient "feel weird"  . Other     PT is a Jehovah Witness and wants no blood products.  . Spider Antivenin [Black Widow Spider Antivenin (L.Mactans)] Swelling and Other (See Comments)    Pain and swelling at site of bite    Chief Complaint  Patient presents with  . Discharge Note    HPI:  84 y.o. female With multiple myeloma, CVA, hypertension, who presented to the ED with complaints of fatigue shortness of breath with exertion and dizziness of 3 weeks duration as well as left lower extremity swelling over the past 3 weeks.  In the ED patient systolic blood pressure was in the 120s to 140s heart rate was in the 70s and 90s hemoglobin was 6.4 down from 7.82 months ago with a baseline of 10-11.  There was some blood in the rectum and a small amount of gross blood in stool and rectum.  GI was consulted in the ED and will see patient in the a.m.  Patient refused blood transfusion in the ED as she is Jehovah's Witness.  Patient was admitted to [facility] from 6/11-24 for symptomatic anemia.  Work-up revealed a tubular villous adenoma in the  colon.  Patient was found to be a high risk for bleeding for surgical or endoscopic procedure.  Upper endoscopy showed nonbleeding angioplastic lesion in the stomach treated with argon plasma coagulation.  Patient had hyperglycemia secondary to her multiple myeloma and received biphosphonate therapy to improve her hypercalcemia.  Patient had decided to decline further chemotherapy.  Patient had acute kidney injury with hyponatremia and hypocalcemia likely prerenal acute renal failure and patient received IV fluids with improvement of renal function.  Patient did have a transient metabolic encephalopathy with delirium which improved with supportive medical treatment.  Patient was admitted to skilled nursing facility for OT/PT and is now ready to be discharged to assisted living facility.    Past Medical History:  Diagnosis Date  . ANXIETY 03/05/2007  . ATAXIA 12/26/2009  . BEE STING 03/05/2007  . BRONCHITIS, ACUTE 06/28/2010  . CANDIDIASIS, VAGINAL 09/08/2008  . CEREBROVASCULAR ACCIDENT, HX OF 01/04/2010  . CERUMEN IMPACTION 06/05/2008  . DIZZINESS 12/26/2009  . Epistaxis 06/05/2008  . Goals of care, counseling/discussion 11/12/2017  . Headache(784.0) 06/05/2008  . HYPERLIPIDEMIA 12/08/2006  . HYPERTENSION 12/08/2006  . 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 Multiple myeloma ([place]) 04/03/2015  . NECK PAIN 06/05/2008  . OBESITY 09/07/2009  . ONYCHOMYCOSIS 03/09/2009  . OSTEOARTHRITIS 12/08/2006  . PARESTHESIA 12/26/2009  . TOBACCO USE, QUIT 03/09/2009    Past Surgical History:  Procedure Laterality Date  . ABDOMINAL HYSTERECTOMY    . BIOPSY  11/05/2017   Procedure: BIOPSY;  Surgeon: Yetta Flock, MD;  Location: [facility];  Service: Gastroenterology;;  . BREAST SURGERY    . COLONOSCOPY WITH PROPOFOL N/A 11/05/2017   Procedure: COLONOSCOPY WITH PROPOFOL;  Surgeon: Yetta Flock, MD;  Location: [facility];  Service: Gastroenterology;  Laterality: N/A;  spot tattoo   .  ESOPHAGOGASTRODUODENOSCOPY (EGD) WITH PROPOFOL N/A 11/05/2017   Procedure: ESOPHAGOGASTRODUODENOSCOPY (EGD) WITH PROPOFOL;  Surgeon: Yetta Flock, MD;  Location: [facility];  Service: Gastroenterology;  Laterality: N/A;  APC  . SUBMUCOSAL INJECTION  11/05/2017   Procedure: SUBMUCOSAL INJECTION;  Surgeon: Yetta Flock, MD;  Location: [facility];  Service: Gastroenterology;;     reports that she has quit smoking. She has never used smokeless tobacco. She reports that she does not drink alcohol or use drugs. Social History   Socioeconomic History  . Marital status: Widowed    Spouse name: Not on file  . Number of children: Not on file  . Years of education: Not on file  . Highest education level: Not on file  Occupational History  . Not on file  Social Needs  . Financial resource strain: Not on file  . Food insecurity:    Worry: Not on file    Inability: Not on file  . Transportation needs:    Medical: Not on file    Non-medical: Not on file  Tobacco Use  . Smoking status: Former Research scientist (life sciences)  . Smokeless tobacco: Never Used  . Tobacco comment: Not sure when; many, many years   Substance and Sexual Activity  . Alcohol use: No    Alcohol/week: 0.0 oz  . Drug use: No  . Sexual activity: Never  Lifestyle  . Physical activity:    Days per week: Not on file    Minutes per session: Not on file  . Stress: Not on file  Relationships  . Social connections:    Talks on phone: Not on file    Gets together: Not on file    Attends religious service: Not on file    Active member of club or organization: Not on file    Attends meetings of clubs or organizations: Not on file    Relationship status: Not on file  . Intimate partner violence:    Fear of current or ex partner: Not on file    Emotionally abused: Not on file    Physically abused: Not on file    Forced sexual activity: Not on file  Other Topics Concern  . Not on file  Social History Narrative  . Not on file      Pertinent  Health Maintenance Due  Topic Date Due  . DEXA SCAN  05/30/1995  . INFLUENZA VACCINE  12/24/2017  . PNA vac Low Risk Adult  Completed    Medications: Allergies as of 12/09/2017      Reactions   Bee Venom Swelling   Swelling at site of sting   Ceftin [cefuroxime Axetil] Other (See Comments)   Made the patient "feel weird"   Other    PT is a Jehovah Witness and wants no blood products.   Spider Antivenin [black Widow Spider Antivenin (l.mactans)] Swelling, Other (See Comments)   Pain and swelling at site of bite      Medication List        Accurate as of 12/09/17 11:59 PM. Always use your most recent med list.          acetaminophen 325 MG tablet Commonly known as:  TYLENOL Take  650 mg by mouth every 6 (six) hours as needed.   ALPRAZolam 0.5 MG tablet Commonly known as:  XANAX Take 0.5 mg by mouth 2 (two) times daily. 14 pills   amLODipine 5 MG tablet Commonly known as:  NORVASC Take 5 mg by mouth daily.   ciprofloxacin 500 MG tablet Commonly known as:  CIPRO Take 500 mg by mouth every 12 (twelve) hours. For 5 more days   diclofenac sodium 1 % Gel Commonly known as:  VOLTAREN Apply 2 g topically 4 (four) times daily.   feeding supplement (ENSURE ENLIVE) Liqd Take 237 mLs by mouth 2 (two) times daily between meals.   HYDROcodone-acetaminophen 5-325 MG tablet Commonly known as:  NORCO/VICODIN Take 1 tablet by mouth every 12 (twelve) hours as needed for moderate pain.   hydrOXYzine 25 MG tablet Commonly known as:  ATARAX/VISTARIL Take 25 mg by mouth at bedtime.   multivitamin tablet Take 1 tablet by mouth daily.   pantoprazole 40 MG tablet Commonly known as:  PROTONIX Take 1 tablet (40 mg total) by mouth daily.   predniSONE 10 MG tablet Commonly known as:  DELTASONE Take 10 mg by mouth daily with breakfast. For 2 more days   QUEtiapine 25 MG tablet Commonly known as:  SEROQUEL Take 25 mg by mouth at bedtime.        Vitals:    12/09/17 1443  BP: 124/77  Pulse: 90  Resp: 19  Temp: 98.3 F (36.8 C)   There is no height or weight on file to calculate BMI.  Physical Exam  GENERAL APPEARANCE: Alert, conversant. No acute distress.  HEENT: Unremarkable. RESPIRATORY: Breathing is even, unlabored. Lung sounds are clear   CARDIOVASCULAR: Heart RRR no murmurs, rubs or gallops. No peripheral edema.  GASTROINTESTINAL: Abdomen is soft, non-tender, not distended w/ normal bowel sounds.  NEUROLOGIC: Cranial nerves 2-12 grossly intact. Moves all extremities   Labs reviewed: Basic Metabolic Panel: Recent Labs    11/14/17 0351 11/15/17 0424 11/16/17 0508  NA 129* 129* 133*  K 3.6 3.2* 3.0*  CL 99* 102 107  CO2 '26 26 24  '$ GLUCOSE 111* 96 90  BUN 32* 30* 21*  CREATININE 1.86* 1.73* 1.52*  CALCIUM 10.3 9.0 8.4*   No results found for: [facility] Liver Function Tests: Recent Labs    11/11/17 0707 11/12/17 0340 11/13/17 0343  AST 14* 12* 13*  ALT 11* 10* 11*  ALKPHOS 44 45 47  BILITOT 0.9 1.2 1.1  PROT 11.1* 11.4* 11.6*  ALBUMIN 2.3* 2.3* 2.2*   No results for input(s): LIPASE, AMYLASE in the last 8760 hours. No results for input(s): AMMONIA in the last 8760 hours. CBC: Recent Labs    11/10/17 0725 11/11/17 0707 11/12/17 0340 11/12/17 1249 11/16/17 0508  WBC 5.4 6.1 6.4  --   --   NEUTROABS 2.9 3.4 3.4  --   --   HGB 6.2* 6.1* 6.1* 6.2* 5.7*  HCT 19.4* 18.8* 19.3* 19.2* 17.9*  MCV 103.7* 103.3* 103.8*  --   --   PLT 162 152 180  --   --    Lipid No results for input(s): CHOL, HDL, LDLCALC, TRIG in the last 8760 hours. Cardiac Enzymes: No results for input(s): CKTOTAL, CKMB, CKMBINDEX, TROPONINI in the last 8760 hours. BNP: No results for input(s): BNP in the last 8760 hours. CBG: Recent Labs    11/03/17 1258  GLUCAP 85    Procedures and Imaging Studies During Stay: No results found.  Assessment/Plan:   Anemia  due to chronic kidney disease, unspecified CKD stage  Iron deficiency  anemia secondary to inadequate dietary iron intake  Colonic mass  Patient is Jehovah's Witness  Hypercalcemia  Multiple myeloma not having achieved remission ([place])  Acute kidney injury ([place])  Hyponatremia  Hypokalemia  Metabolic encephalopathy  Gastroesophageal reflux disease without esophagitis  Angiodysplasia of stomach  Brief psychotic disorder (H[facility])  Chronic pain of right knee  Essential hypertension   Patient is being discharged with the following home health services: OT/PT  Patient is being discharged with the following durable medical equipment: Semi-electric bed, standard wheelchair, 3 and 1 bedside commode  Patient has been advised to f/u with their PCP in 1-2 weeks to bring them up to date on their rehab stay.  Social services at facility was responsible for arranging this appointment.  Pt was provided with a 30 day supply of prescriptions for medications and refills must be obtained from their PCP.  For controlled substances, a more limited supply may be provided adequate until PCP appointment only.  Medications have been reconciled.  Time spent greater than 30 minutes;> 50% of time with patient was spent reviewing records, labs, tests and studies, counseling and developing plan of care  Inocencio Homes, MD

## 2017-12-10 ENCOUNTER — Other Ambulatory Visit: Payer: Self-pay

## 2017-12-10 ENCOUNTER — Ambulatory Visit: Payer: Medicare Other

## 2017-12-10 ENCOUNTER — Inpatient Hospital Stay: Payer: PRIVATE HEALTH INSURANCE

## 2017-12-10 DIAGNOSIS — K922 Gastrointestinal hemorrhage, unspecified: Secondary | ICD-10-CM | POA: Diagnosis not present

## 2017-12-10 DIAGNOSIS — N189 Chronic kidney disease, unspecified: Secondary | ICD-10-CM

## 2017-12-10 DIAGNOSIS — IMO0001 Reserved for inherently not codable concepts without codable children: Secondary | ICD-10-CM

## 2017-12-10 DIAGNOSIS — C9000 Multiple myeloma not having achieved remission: Secondary | ICD-10-CM | POA: Diagnosis not present

## 2017-12-10 DIAGNOSIS — D631 Anemia in chronic kidney disease: Secondary | ICD-10-CM

## 2017-12-10 DIAGNOSIS — Z789 Other specified health status: Secondary | ICD-10-CM

## 2017-12-10 DIAGNOSIS — Z79899 Other long term (current) drug therapy: Secondary | ICD-10-CM | POA: Diagnosis not present

## 2017-12-10 DIAGNOSIS — D649 Anemia, unspecified: Secondary | ICD-10-CM | POA: Diagnosis not present

## 2017-12-10 MED ORDER — EPOETIN ALFA 20000 UNIT/ML IJ SOLN
40000.0000 [IU] | Freq: Once | INTRAMUSCULAR | Status: AC
  Administered 2017-12-10: 40000 [IU] via SUBCUTANEOUS

## 2017-12-10 MED ORDER — EPOETIN ALFA 40000 UNIT/ML IJ SOLN
INTRAMUSCULAR | Status: AC
  Filled 2017-12-10: qty 1

## 2017-12-10 NOTE — Progress Notes (Signed)
Patient had labwork at [facility] and rehab facility.  Hgb on 12/02/17 was 5.5.  Verified with Romualdo Bolk [facility] that this date was acceptable for administration.

## 2017-12-10 NOTE — Patient Instructions (Signed)
Epoetin Alfa injection °What is this medicine? °EPOETIN ALFA (e POE e tin AL fa) helps your body make more red blood cells. This medicine is used to treat anemia caused by chronic kidney failure, cancer chemotherapy, or HIV-therapy. It may also be used before surgery if you have anemia. °This medicine may be used for other purposes; ask your health care provider or pharmacist if you have questions. °COMMON BRAND NAME(S): Epogen, Procrit °What should I tell my health care provider before I take this medicine? °They need to know if you have any of these conditions: °-blood clotting disorders °-cancer patient not on chemotherapy °-cystic fibrosis °-heart disease, such as angina or heart failure °-hemoglobin level of 12 g/dL or greater °-high blood pressure °-low levels of folate, iron, or vitamin B12 °-seizures °-an unusual or allergic reaction to erythropoietin, albumin, benzyl alcohol, hamster proteins, other medicines, foods, dyes, or preservatives °-pregnant or trying to get pregnant °-breast-feeding °How should I use this medicine? °This medicine is for injection into a vein or under the skin. It is usually given by a health care professional in a hospital or clinic setting. °If you get this medicine at home, you will be taught how to prepare and give this medicine. Use exactly as directed. Take your medicine at regular intervals. Do not take your medicine more often than directed. °It is important that you put your used needles and syringes in a special sharps container. Do not put them in a trash can. If you do not have a sharps container, call your pharmacist or healthcare provider to get one. °A special MedGuide will be given to you by the pharmacist with each prescription and refill. Be sure to read this information carefully each time. °Talk to your pediatrician regarding the use of this medicine in children. While this drug may be prescribed for selected conditions, precautions do apply. °Overdosage: If you  think you have taken too much of this medicine contact a poison control center or emergency room at once. °NOTE: This medicine is only for you. Do not share this medicine with others. °What if I miss a dose? °If you miss a dose, take it as soon as you can. If it is almost time for your next dose, take only that dose. Do not take double or extra doses. °What may interact with this medicine? °Do not take this medicine with any of the following medications: °-darbepoetin alfa °This list may not describe all possible interactions. Give your health care provider a list of all the medicines, herbs, non-prescription drugs, or dietary supplements you use. Also tell them if you smoke, drink alcohol, or use illegal drugs. Some items may interact with your medicine. °What should I watch for while using this medicine? °Your condition will be monitored carefully while you are receiving this medicine. °You may need blood work done while you are taking this medicine. °What side effects may I notice from receiving this medicine? °Side effects that you should report to your doctor or health care professional as soon as possible: °-allergic reactions like skin rash, itching or hives, swelling of the face, lips, or tongue °-breathing problems °-changes in vision °-chest pain °-confusion, trouble speaking or understanding °-feeling faint or lightheaded, falls °-high blood pressure °-muscle aches or pains °-pain, swelling, warmth in the leg °-rapid weight gain °-severe headaches °-sudden numbness or weakness of the face, arm or leg °-trouble walking, dizziness, loss of balance or coordination °-seizures (convulsions) °-swelling of the ankles, feet, hands °-unusually weak or tired °  Side effects that usually do not require medical attention (report to your doctor or health care professional if they continue or are bothersome): °-diarrhea °-fever, chills (flu-like symptoms) °-headaches °-nausea, vomiting °-redness, stinging, or swelling at  site where injected °This list may not describe all possible side effects. Call your doctor for medical advice about side effects. You may report side effects to FDA at 1-800-FDA-1088. °Where should I keep my medicine? °Keep out of the reach of children. °Store in a refrigerator between 2 and 8 degrees C (36 and 46 degrees F). Do not freeze or shake. Throw away any unused portion if using a single-dose vial. Multi-dose vials can be kept in the refrigerator for up to 21 days after the initial dose. Throw away unused medicine. °NOTE: This sheet is a summary. It may not cover all possible information. If you have questions about this medicine, talk to your doctor, pharmacist, or health care provider. °© 2018 Elsevier/Gold Standard (2015-12-31 19:42:31) ° °

## 2017-12-11 ENCOUNTER — Ambulatory Visit: Payer: Medicare Other | Admitting: Internal Medicine

## 2017-12-11 ENCOUNTER — Ambulatory Visit: Payer: Medicare Other

## 2017-12-11 DIAGNOSIS — Z0289 Encounter for other administrative examinations: Secondary | ICD-10-CM

## 2017-12-12 DIAGNOSIS — S31831D Laceration without foreign body of anus, subsequent encounter: Secondary | ICD-10-CM | POA: Diagnosis not present

## 2017-12-12 DIAGNOSIS — M25561 Pain in right knee: Secondary | ICD-10-CM | POA: Diagnosis not present

## 2017-12-12 DIAGNOSIS — M1991 Primary osteoarthritis, unspecified site: Secondary | ICD-10-CM | POA: Diagnosis not present

## 2017-12-12 DIAGNOSIS — I10 Essential (primary) hypertension: Secondary | ICD-10-CM | POA: Diagnosis not present

## 2017-12-12 DIAGNOSIS — C9000 Multiple myeloma not having achieved remission: Secondary | ICD-10-CM | POA: Diagnosis not present

## 2017-12-12 DIAGNOSIS — D126 Benign neoplasm of colon, unspecified: Secondary | ICD-10-CM | POA: Diagnosis not present

## 2017-12-14 ENCOUNTER — Telehealth: Payer: Self-pay | Admitting: Internal Medicine

## 2017-12-14 DIAGNOSIS — M25561 Pain in right knee: Secondary | ICD-10-CM | POA: Diagnosis not present

## 2017-12-14 DIAGNOSIS — C9000 Multiple myeloma not having achieved remission: Secondary | ICD-10-CM | POA: Diagnosis not present

## 2017-12-14 DIAGNOSIS — M1991 Primary osteoarthritis, unspecified site: Secondary | ICD-10-CM | POA: Diagnosis not present

## 2017-12-14 DIAGNOSIS — D126 Benign neoplasm of colon, unspecified: Secondary | ICD-10-CM | POA: Diagnosis not present

## 2017-12-14 DIAGNOSIS — I10 Essential (primary) hypertension: Secondary | ICD-10-CM | POA: Diagnosis not present

## 2017-12-14 DIAGNOSIS — S31831D Laceration without foreign body of anus, subsequent encounter: Secondary | ICD-10-CM | POA: Diagnosis not present

## 2017-12-14 NOTE — Telephone Encounter (Signed)
Ok Thx 

## 2017-12-14 NOTE — Telephone Encounter (Signed)
Notified Amanda w/MD response.../lmb 

## 2017-12-14 NOTE — Telephone Encounter (Signed)
Copied from [place] [phone]. Topic: Quick Communication - See Telephone Encounter >> Dec 14, 2017 10:16 AM Synthia Innocent wrote: CRM for notification. See Telephone encounter for: 12/14/17. [facility] requesting nursing orders. 2x a week for 4 weeks, 1x a week for 2 weeks mediation management and skin tear

## 2017-12-15 ENCOUNTER — Telehealth: Payer: Self-pay | Admitting: Internal Medicine

## 2017-12-15 DIAGNOSIS — I10 Essential (primary) hypertension: Secondary | ICD-10-CM | POA: Diagnosis not present

## 2017-12-15 DIAGNOSIS — D126 Benign neoplasm of colon, unspecified: Secondary | ICD-10-CM | POA: Diagnosis not present

## 2017-12-15 DIAGNOSIS — M1991 Primary osteoarthritis, unspecified site: Secondary | ICD-10-CM | POA: Diagnosis not present

## 2017-12-15 DIAGNOSIS — C9000 Multiple myeloma not having achieved remission: Secondary | ICD-10-CM | POA: Diagnosis not present

## 2017-12-15 DIAGNOSIS — S31831D Laceration without foreign body of anus, subsequent encounter: Secondary | ICD-10-CM | POA: Diagnosis not present

## 2017-12-15 DIAGNOSIS — M25561 Pain in right knee: Secondary | ICD-10-CM | POA: Diagnosis not present

## 2017-12-15 NOTE — Telephone Encounter (Signed)
Copied from [place] [id number]. Topic: General - Other >> Dec 15, 2017  8:48 AM Cecelia Byars, NT wrote: Reason for CRM: Retaj Hilbun the patiens niece  called and would like for Dr Alain Marion give permission to the nurse from [facility]  to do a blood count on the patient  as soon as possible  please call the nurse Krisitna  at [phone]

## 2017-12-15 NOTE — Telephone Encounter (Unsigned)
Copied from [place] [phone]. Topic: Quick Communication - See Telephone Encounter >> Dec 15, 2017  4:49 PM Neva Seat wrote: Orland Mustard w/ [facility] [phone]  Verbal Orders for: Home Health  2 times a week for 5 weeks

## 2017-12-15 NOTE — Telephone Encounter (Signed)
Please advise 

## 2017-12-16 ENCOUNTER — Telehealth: Payer: Self-pay | Admitting: Internal Medicine

## 2017-12-16 ENCOUNTER — Other Ambulatory Visit: Payer: Self-pay | Admitting: *Deleted

## 2017-12-16 DIAGNOSIS — D126 Benign neoplasm of colon, unspecified: Secondary | ICD-10-CM | POA: Diagnosis not present

## 2017-12-16 DIAGNOSIS — M25561 Pain in right knee: Secondary | ICD-10-CM | POA: Diagnosis not present

## 2017-12-16 DIAGNOSIS — I10 Essential (primary) hypertension: Secondary | ICD-10-CM | POA: Diagnosis not present

## 2017-12-16 DIAGNOSIS — S31831D Laceration without foreign body of anus, subsequent encounter: Secondary | ICD-10-CM | POA: Diagnosis not present

## 2017-12-16 DIAGNOSIS — M1991 Primary osteoarthritis, unspecified site: Secondary | ICD-10-CM | POA: Diagnosis not present

## 2017-12-16 DIAGNOSIS — C9000 Multiple myeloma not having achieved remission: Secondary | ICD-10-CM | POA: Diagnosis not present

## 2017-12-16 MED ORDER — ALPRAZOLAM 0.5 MG PO TABS: 0.5000 mg | ORAL_TABLET | Freq: Two times a day (BID) | ORAL | 1 refills | Status: DC

## 2017-12-16 MED ORDER — ALPRAZOLAM 0.5 MG PO TABS: 0.5000 mg | ORAL_TABLET | Freq: Two times a day (BID) | ORAL | 1 refills | Status: AC

## 2017-12-16 NOTE — Telephone Encounter (Signed)
Orders given to [facility]

## 2017-12-16 NOTE — Telephone Encounter (Signed)
Per PCP Alprazolam Rx printed/faxed to [facility] @ [phone].

## 2017-12-16 NOTE — Telephone Encounter (Signed)
Copied from [place] [phone]. Topic: Quick Communication - See Telephone Encounter >> Dec 16, 2017 12:04 PM Synthia Innocent wrote: CRM for notification. See Telephone encounter for: 12/16/17. OT with [facility] requesting verbals orders, 2x a week for 4 weeks, 1x a week for 1 week

## 2017-12-16 NOTE — Telephone Encounter (Signed)
Ok Thx 

## 2017-12-17 ENCOUNTER — Inpatient Hospital Stay: Payer: PRIVATE HEALTH INSURANCE

## 2017-12-17 ENCOUNTER — Encounter: Payer: Self-pay | Admitting: Hematology & Oncology

## 2017-12-17 ENCOUNTER — Inpatient Hospital Stay (HOSPITAL_BASED_OUTPATIENT_CLINIC_OR_DEPARTMENT_OTHER): Payer: PRIVATE HEALTH INSURANCE | Admitting: Hematology & Oncology

## 2017-12-17 ENCOUNTER — Other Ambulatory Visit: Payer: Self-pay

## 2017-12-17 ENCOUNTER — Telehealth: Payer: Self-pay | Admitting: *Deleted

## 2017-12-17 VITALS — BP 115/51 | HR 97 | Temp 98.8°F | Resp 20

## 2017-12-17 DIAGNOSIS — D649 Anemia, unspecified: Secondary | ICD-10-CM | POA: Diagnosis not present

## 2017-12-17 DIAGNOSIS — C9000 Multiple myeloma not having achieved remission: Secondary | ICD-10-CM

## 2017-12-17 DIAGNOSIS — Z79899 Other long term (current) drug therapy: Secondary | ICD-10-CM

## 2017-12-17 DIAGNOSIS — K922 Gastrointestinal hemorrhage, unspecified: Secondary | ICD-10-CM

## 2017-12-17 DIAGNOSIS — C9002 Multiple myeloma in relapse: Secondary | ICD-10-CM

## 2017-12-17 LAB — CMP (CANCER CENTER ONLY)
ALBUMIN: 2.3 g/dL — AB (ref 3.5–5.0)
ALT: 10 U/L (ref 0–44)
ANION GAP: 2 — AB (ref 5–15)
AST: 21 U/L (ref 15–41)
Alkaline Phosphatase: 51 U/L (ref 38–126)
BILIRUBIN TOTAL: 0.6 mg/dL (ref 0.3–1.2)
BUN: 13 mg/dL (ref 8–23)
CO2: 24 mmol/L (ref 22–32)
Calcium: 8.3 mg/dL — ABNORMAL LOW (ref 8.9–10.3)
Chloride: 109 mmol/L (ref 98–111)
Creatinine: 1.31 mg/dL — ABNORMAL HIGH (ref 0.44–1.00)
GFR, Est AFR Am: 41 mL/min — ABNORMAL LOW (ref >60)
GFR, Estimated: 35 mL/min — ABNORMAL LOW (ref >60)
Glucose, Bld: 102 mg/dL — ABNORMAL HIGH (ref 70–99)
Potassium: 3.8 mmol/L (ref 3.5–5.1)
SODIUM: 135 mmol/L (ref 135–145)
TOTAL PROTEIN: 12.2 g/dL — AB (ref 6.5–8.1)

## 2017-12-17 LAB — CBC WITH DIFFERENTIAL (CANCER CENTER ONLY)
BASOS ABS: 0 10*3/uL (ref 0.0–0.1)
Basophils Relative: 0 %
Eosinophils Absolute: 0.1 10*3/uL (ref 0.0–0.5)
Eosinophils Relative: 2 %
HEMATOCRIT: 20.7 % — AB (ref 34.8–46.6)
HEMOGLOBIN: 6.3 g/dL — AB (ref 11.6–15.9)
Lymphocytes Relative: 30 %
Lymphs Abs: 1.8 10*3/uL (ref 0.9–3.3)
MCH: 33.2 pg (ref 26.0–34.0)
MCHC: 30.4 g/dL — ABNORMAL LOW (ref 32.0–36.0)
MCV: 108.9 fL — AB (ref 81.0–101.0)
Monocytes Absolute: 0.7 10*3/uL (ref 0.1–0.9)
Monocytes Relative: 12 %
NEUTROS ABS: 3.5 10*3/uL (ref 1.5–6.5)
Neutrophils Relative %: 56 %
Platelet Count: 208 10*3/uL (ref 145–400)
RBC: 1.9 MIL/uL — AB (ref 3.70–5.32)
RDW: 19.9 % — ABNORMAL HIGH (ref 11.1–15.7)
WBC: 6.1 10*3/uL (ref 3.9–10.0)

## 2017-12-17 NOTE — Telephone Encounter (Signed)
Critical Value Hgb 6.3 Dr Ennever notified. No orders at this time 

## 2017-12-17 NOTE — Telephone Encounter (Signed)
LM giving verbals, FYI 

## 2017-12-17 NOTE — Progress Notes (Signed)
Hematology and Oncology Follow Up Visit  Mindy Taylor [id number] 1928/07/03 84 y.o. 12/17/2017   Principle Diagnosis:   Recurrent IgG dual light chain myeloma  Anemia due to myeloma/bleeding  JEHOVAH'S WITNESS  Current Therapy:    Observation     Interim History:  Mindy Taylor is back to the office for a follow-up.  I am somewhat surprised that we are seeing her.  I saw her in the hospital back in June.  At that time, Mindy Taylor came in with marked anemia.  Mindy Taylor has GI bleeding.  Mindy Taylor was iron deficient.  Mindy Taylor received iron supplements.  Her myeloma studies showed an IgG level of 8400 mg/dL.  Her Kappa light chain was 5.8 mg/dL.  Her lambda light chain was 7.4 mg/dL.  Her M spike was 5.3 g/dL.  We did a bone marrow biopsy on her.  This was done on 11/10/2017.  The pathology report ([id number]) showed extensive involvement by plasma cell disease.  Mindy Taylor had 75% involvement.  Mindy Taylor had normal cytogenetics on the bone marrow.  Mindy Taylor was on Procrit in the hospital.  I do not think that this was really going to work unless the myeloma was treated.  We try to get her set up with therapy.  I thought that Kyprolis/Cytoxan will be a great regimen for her.  Given that Mindy Taylor is 84 years old, I thought this would be pretty well tolerated.  At the time of, her family did not feel that Mindy Taylor would be able to tolerate treatment.  Mindy Taylor was discharged to a nursing home.  Mindy Taylor comes in today.  Mindy Taylor has Mindy Taylor looks better than I would have thought.  Her hemoglobin was 6.3.  Her white cell count was 6.1 and platelet count 208,000.  Her Total protein was 12.2.  Her albumin was 2.3.  Her creatinine 1.31.  I talked at length to her again about treatment.  I just cannot imagine that Mindy Taylor is ever going to improve without getting some myeloma out of her bone marrow.  Mindy Taylor says Mindy Taylor thought that Mindy Taylor would be interested in trying.  However, Mindy Taylor wanted me to talk to her knees.  I did talk to her niece today.  Her niece is up in [place] at  a family reunion.  I explained to her niece that I thought that Mindy Taylor would be able to handle treatment.  I would reduce dose the treatment.  Again, we are not looking for cure with treatment but just myelomatous improvement so that Mindy Taylor will be able to make red blood cells again.  Her niece says Mindy Taylor will talk to other family members.  Mindy Taylor is eating okay.  Mindy Taylor is had no nausea or vomiting.  Mindy Taylor has had no cough.  Mindy Taylor is had no infections.  Mindy Taylor said that Mindy Taylor still has some bleeding.  Overall, her performance status is ECOG 2.   Medications:  Current Outpatient Medications:  .  acetaminophen (TYLENOL) 325 MG tablet, Take 650 mg by mouth every 6 (six) hours as needed., Disp: , Rfl:  .  ALPRAZolam (XANAX) 0.5 MG tablet, Take 1 tablet (0.5 mg total) by mouth 2 (two) times daily., Disp: 60 tablet, Rfl: 1 .  amLODipine (NORVASC) 5 MG tablet, Take 5 mg by mouth daily., Disp: , Rfl:  .  ciprofloxacin (CIPRO) 500 MG tablet, Take 500 mg by mouth every 12 (twelve) hours. For 5 more days, Disp: , Rfl:  .  diclofenac sodium (VOLTAREN) 1 % GEL, Apply 2 g  topically 4 (four) times daily., Disp: 1 Tube, Rfl: 0 .  feeding supplement, ENSURE ENLIVE, (ENSURE ENLIVE) LIQD, Take 237 mLs by mouth 2 (two) times daily between meals., Disp: 237 mL, Rfl: 12 .  HYDROcodone-acetaminophen (NORCO/VICODIN) 5-325 MG tablet, Take 1 tablet by mouth every 12 (twelve) hours as needed for moderate pain., Disp: , Rfl:  .  hydrOXYzine (ATARAX/VISTARIL) 25 MG tablet, Take 25 mg by mouth at bedtime., Disp: , Rfl:  .  Multiple Vitamin (MULTIVITAMIN) tablet, Take 1 tablet by mouth daily.  , Disp: , Rfl:  .  pantoprazole (PROTONIX) 40 MG tablet, Take 1 tablet (40 mg total) by mouth daily., Disp: 30 tablet, Rfl: 0 .  predniSONE (DELTASONE) 10 MG tablet, Take 10 mg by mouth daily with breakfast. For 2 more days, Disp: , Rfl:  .  QUEtiapine (SEROQUEL) 25 MG tablet, Take 25 mg by mouth at bedtime., Disp: , Rfl:    Allergies:  Allergies  Allergen Reactions  . Bee Venom Swelling    Swelling at site of sting  . Ceftin [Cefuroxime Axetil] Other (See Comments)    Made the patient "feel weird"  . Other     PT is a Jehovah Witness and wants no blood products.  . Spider Antivenin [Black Widow Spider Antivenin (L.Mactans)] Swelling and Other (See Comments)    Pain and swelling at site of bite    Past Medical History, Surgical history, Social history, and Family History were reviewed and updated.  Review of Systems: Review of Systems  Constitutional: Negative.   HENT:  Negative.   Eyes: Negative.   Respiratory: Negative.   Cardiovascular: Negative.   Gastrointestinal: Positive for blood in stool.  Endocrine: Negative.   Genitourinary: Negative.    Musculoskeletal: Negative.   Skin: Negative.   Neurological: Negative.   Hematological: Negative.   Psychiatric/Behavioral: Negative.     Physical Exam:  oral temperature is 98.8 F (37.1 C). Her blood pressure is 115/51 (abnormal) and her pulse is 97. Her respiration is 20 and oxygen saturation is 99%.   Wt Readings from Last 3 Encounters:  11/18/17 190 lb (86.2 kg)  11/11/17 193 lb (87.5 kg)  09/04/17 199 lb (90.3 kg)    Physical Exam  Constitutional: Mindy Taylor is oriented to person, place, and time.  HENT:  Head: Normocephalic and atraumatic.  Mouth/Throat: Oropharynx is clear and moist.  Eyes: Pupils are equal, round, and reactive to light. EOM are normal.  Neck: Normal range of motion.  Cardiovascular: Normal rate, regular rhythm and normal heart sounds.  Pulmonary/Chest: Effort normal and breath sounds normal.  Abdominal: Soft. Bowel sounds are normal.  Musculoskeletal: Normal range of motion. Mindy Taylor exhibits no edema, tenderness or deformity.  Lymphadenopathy:    Mindy Taylor has no cervical adenopathy.  Neurological: Mindy Taylor is alert and oriented to person, place, and time.  Skin: Skin is warm and dry. No rash noted. No erythema.  Psychiatric:  Mindy Taylor has a normal mood and affect. Her behavior is normal. Judgment and thought content normal.  Vitals reviewed.    Lab Results  Component Value Date   WBC 6.1 12/17/2017   HGB 6.3 (LL) 12/17/2017   HCT 20.7 (L) 12/17/2017   MCV 108.9 (H) 12/17/2017   PLT 208 12/17/2017     Chemistry      Component Value Date/Time   NA 135 12/17/2017 1143   NA 140 12/11/2015 1002   K 3.8 12/17/2017 1143   K 3.8 12/11/2015 1002   CL 109 12/17/2017 1143  CL 107 10/08/2015 0956   CO2 24 12/17/2017 1143   CO2 28 12/11/2015 1002   BUN 13 12/17/2017 1143   BUN 12.6 12/11/2015 1002   CREATININE 1.31 (H) 12/17/2017 1143   CREATININE 0.8 12/11/2015 1002      Component Value Date/Time   CALCIUM 8.3 (L) 12/17/2017 1143   CALCIUM 9.2 12/11/2015 1002   ALKPHOS 51 12/17/2017 1143   ALKPHOS 85 12/11/2015 1002   AST 21 12/17/2017 1143   AST 17 12/11/2015 1002   ALT 10 12/17/2017 1143   ALT 11 12/11/2015 1002   BILITOT 0.6 12/17/2017 1143   BILITOT 1.30 (H) 12/11/2015 1002       Impression and Plan: Mindy Taylor is a 84 year old African-American female.  Mindy Taylor has relapsed IgG kappa/lambda myeloma.  This was proven by bone marrow biopsy.  Mindy Taylor has a markedly elevated IgG level.  Again, her anemia will never get better unless the myeloma is somehow cleared out of her marrow.  I think the only way that this will happen is with chemotherapy.  Again, I think Kyprolis/Cytoxan would be tolerable.  I think it would have about a 75% chance of helping.  I will try to get her set up, tentatively, for treatment next week.  I spent 40 minutes with her today.  All the time was spent face-to-face.  I spent time counseling her and coordinating care.  I talked to her daughter by phone after I saw my patients.  Will have her come back in a week.  We will have her see our nurse practitioner.  Her niece said that they will let us know if Mindy Taylor is to have treatment.   [name], MD 7/25/20195:44 PM

## 2017-12-18 DIAGNOSIS — M25561 Pain in right knee: Secondary | ICD-10-CM | POA: Diagnosis not present

## 2017-12-18 DIAGNOSIS — C9000 Multiple myeloma not having achieved remission: Secondary | ICD-10-CM | POA: Diagnosis not present

## 2017-12-18 DIAGNOSIS — S31831D Laceration without foreign body of anus, subsequent encounter: Secondary | ICD-10-CM | POA: Diagnosis not present

## 2017-12-18 DIAGNOSIS — D126 Benign neoplasm of colon, unspecified: Secondary | ICD-10-CM | POA: Diagnosis not present

## 2017-12-18 DIAGNOSIS — I10 Essential (primary) hypertension: Secondary | ICD-10-CM | POA: Diagnosis not present

## 2017-12-18 DIAGNOSIS — M1991 Primary osteoarthritis, unspecified site: Secondary | ICD-10-CM | POA: Diagnosis not present

## 2017-12-18 LAB — KAPPA/LAMBDA LIGHT CHAINS
KAPPA FREE LGHT CHN: 156.1 mg/L — AB (ref 3.3–19.4)
Kappa, lambda light chain ratio: 1.26 (ref 0.26–1.65)
LAMDA FREE LIGHT CHAINS: 123.7 mg/L — AB (ref 5.7–26.3)

## 2017-12-18 LAB — IGG, IGA, IGM
IGA: 12 mg/dL — AB (ref 64–422)
IgG (Immunoglobin G), Serum: 8535 mg/dL — ABNORMAL HIGH (ref 700–1600)
IgM (Immunoglobulin M), Srm: 14 mg/dL — ABNORMAL LOW (ref 26–217)

## 2017-12-20 LAB — PROTEIN ELECTROPHORESIS, SERUM
A/G Ratio: 0.4 — ABNORMAL LOW (ref 0.7–1.7)
Albumin ELP: 3.2 g/dL (ref 2.9–4.4)
Alpha-1-Globulin: 0.3 g/dL (ref 0.0–0.4)
Alpha-2-Globulin: 1.1 g/dL — ABNORMAL HIGH (ref 0.4–1.0)
BETA GLOBULIN: 1.2 g/dL (ref 0.7–1.3)
Gamma Globulin: 5.5 g/dL — ABNORMAL HIGH (ref 0.4–1.8)
Globulin, Total: 8.1 g/dL — ABNORMAL HIGH (ref 2.2–3.9)
M-Spike, %: 5.3 g/dL — ABNORMAL HIGH
Total Protein ELP: 11.3 g/dL — ABNORMAL HIGH (ref 6.0–8.5)

## 2017-12-21 ENCOUNTER — Non-Acute Institutional Stay: Payer: Medicare Other | Admitting: Licensed Clinical Social Worker

## 2017-12-21 DIAGNOSIS — Z515 Encounter for palliative care: Secondary | ICD-10-CM

## 2017-12-22 ENCOUNTER — Telehealth: Payer: Self-pay | Admitting: *Deleted

## 2017-12-22 DIAGNOSIS — D126 Benign neoplasm of colon, unspecified: Secondary | ICD-10-CM | POA: Diagnosis not present

## 2017-12-22 DIAGNOSIS — M1991 Primary osteoarthritis, unspecified site: Secondary | ICD-10-CM | POA: Diagnosis not present

## 2017-12-22 DIAGNOSIS — I10 Essential (primary) hypertension: Secondary | ICD-10-CM | POA: Diagnosis not present

## 2017-12-22 DIAGNOSIS — C9000 Multiple myeloma not having achieved remission: Secondary | ICD-10-CM | POA: Diagnosis not present

## 2017-12-22 DIAGNOSIS — M25561 Pain in right knee: Secondary | ICD-10-CM | POA: Diagnosis not present

## 2017-12-22 DIAGNOSIS — S31831D Laceration without foreign body of anus, subsequent encounter: Secondary | ICD-10-CM | POA: Diagnosis not present

## 2017-12-22 NOTE — Telephone Encounter (Signed)
Received call from patient's niece, Meredeth Furber, stating the patient has decided that she does NOT want to proceed with treatment. She has decided to decline all treatment at this time.  All appointments for chemo education and chemo treatments cancelled. Patient also wishes to cancel all future appointments for lab and MD at this time. ALL future appointments cancelled. Explained to the niece, that at any time the patient needs to see Dr Marin Olp for symptom management or follow up, she can call to schedule a follow up appointment. She will call back if needed.

## 2017-12-23 ENCOUNTER — Ambulatory Visit: Payer: Medicare Other

## 2017-12-23 ENCOUNTER — Other Ambulatory Visit: Payer: Medicare Other

## 2017-12-23 ENCOUNTER — Ambulatory Visit: Payer: Medicare Other | Admitting: Family

## 2017-12-23 DIAGNOSIS — S31831D Laceration without foreign body of anus, subsequent encounter: Secondary | ICD-10-CM | POA: Diagnosis not present

## 2017-12-23 DIAGNOSIS — M1991 Primary osteoarthritis, unspecified site: Secondary | ICD-10-CM | POA: Diagnosis not present

## 2017-12-23 DIAGNOSIS — C9000 Multiple myeloma not having achieved remission: Secondary | ICD-10-CM | POA: Diagnosis not present

## 2017-12-23 DIAGNOSIS — I10 Essential (primary) hypertension: Secondary | ICD-10-CM | POA: Diagnosis not present

## 2017-12-23 DIAGNOSIS — M25561 Pain in right knee: Secondary | ICD-10-CM | POA: Diagnosis not present

## 2017-12-23 DIAGNOSIS — D126 Benign neoplasm of colon, unspecified: Secondary | ICD-10-CM | POA: Diagnosis not present

## 2017-12-23 NOTE — Progress Notes (Signed)
COMMUNITY PALLIATIVE CARE SW NOTE  PATIENT NAME: Mindy Taylor DOB: 1929/03/15 MRN: [id number]  PRIMARY CARE PROVIDER: Cassandria Anger, MD  RESPONSIBLE PARTY:  Acct ID - Guarantor Home Phone Work Phone Relationship Acct Type  [id number] - Munoz,VER* [phone]  Self P/F     [facility] [address], [place], [place], [place] and INTERVENTIONS:             1. GOALS OF CARE/ ADVANCE CARE PLANNING:  Goal is for patient to be comfortable with her own items in her current surroundings.  Patient has a DNR and MOST form. 2. SOCIAL/EMOTIONAL/SPIRITUAL ASSESSMENT/ INTERVENTIONS:  SW met with patient at [facility].  She moved to the [facility] from [facility] two weeks ago per facility staff.  Patient was in her room in her w/c.  She denied pain.  SW provided active listening while she expressed her feelings regarding the move.  Patient appeared to be grieving the loss of her car and apartment.  She was previously independent and misses that feeling.  SW consulted the [facility], Levada Dy, who reported patient appears to be adjusting to the move. 3. PATIENT/CAREGIVER EDUCATION/ COPING:  SW provided education regarding the Palliative Care Program.  Patient copes by expressing her feelings openly. 4. PERSONAL EMERGENCY PLAN:  Per facility protocol. 5. COMMUNITY RESOURCES COORDINATION/ HEALTH CARE NAVIGATION:  None. 6. FINANCIAL/LEGAL CONCERNS/INTERVENTIONS:  None.     SOCIAL HX:  Social History   Tobacco Use  . Smoking status: Former Research scientist (life sciences)  . Smokeless tobacco: Never Used  . Tobacco comment: Not sure when; many, many years   Substance Use Topics  . Alcohol use: No    Alcohol/week: 0.0 oz    CODE STATUS:   DNR  ADVANCED DIRECTIVES: N MOST FORM COMPLETE:  Yes HOSPICE EDUCATION PROVIDED: No  PPS:  Patient appetite appears normal.  She is able to move herself around in her w/c. Duration of visit and documentation:  60 minites.      Creola Corn  Yarielys Beed, LCSW

## 2017-12-24 ENCOUNTER — Encounter: Payer: Self-pay | Admitting: Internal Medicine

## 2017-12-24 ENCOUNTER — Ambulatory Visit: Payer: Medicare Other

## 2017-12-24 ENCOUNTER — Ambulatory Visit (INDEPENDENT_AMBULATORY_CARE_PROVIDER_SITE_OTHER): Payer: Medicare Other | Admitting: Internal Medicine

## 2017-12-24 DIAGNOSIS — D126 Benign neoplasm of colon, unspecified: Secondary | ICD-10-CM | POA: Diagnosis not present

## 2017-12-24 DIAGNOSIS — M1991 Primary osteoarthritis, unspecified site: Secondary | ICD-10-CM | POA: Diagnosis not present

## 2017-12-24 DIAGNOSIS — S31831D Laceration without foreign body of anus, subsequent encounter: Secondary | ICD-10-CM | POA: Diagnosis not present

## 2017-12-24 DIAGNOSIS — Z789 Other specified health status: Secondary | ICD-10-CM | POA: Diagnosis not present

## 2017-12-24 DIAGNOSIS — D508 Other iron deficiency anemias: Secondary | ICD-10-CM

## 2017-12-24 DIAGNOSIS — M25561 Pain in right knee: Secondary | ICD-10-CM | POA: Diagnosis not present

## 2017-12-24 DIAGNOSIS — R040 Epistaxis: Secondary | ICD-10-CM | POA: Diagnosis not present

## 2017-12-24 DIAGNOSIS — I10 Essential (primary) hypertension: Secondary | ICD-10-CM | POA: Diagnosis not present

## 2017-12-24 DIAGNOSIS — I63519 Cerebral infarction due to unspecified occlusion or stenosis of unspecified middle cerebral artery: Secondary | ICD-10-CM | POA: Diagnosis not present

## 2017-12-24 DIAGNOSIS — R634 Abnormal weight loss: Secondary | ICD-10-CM

## 2017-12-24 DIAGNOSIS — IMO0001 Reserved for inherently not codable concepts without codable children: Secondary | ICD-10-CM

## 2017-12-24 DIAGNOSIS — C9000 Multiple myeloma not having achieved remission: Secondary | ICD-10-CM | POA: Diagnosis not present

## 2017-12-24 MED ORDER — GABAPENTIN 100 MG PO CAPS: 100.0000 mg | ORAL_CAPSULE | Freq: Two times a day (BID) | ORAL | 3 refills | Status: AC | PRN

## 2017-12-24 NOTE — Assessment & Plan Note (Signed)
Monitor CMET

## 2017-12-24 NOTE — Assessment & Plan Note (Signed)
Severe anemia discussed Hgb 6.3%

## 2017-12-24 NOTE — Progress Notes (Signed)
Subjective:  Patient ID: Mindy Taylor, female    DOB: [birth year]  Age: 84 y.o. MRN: [id number]  CC: No chief complaint on file.   HPI Mindy Taylor presents for severe anemia, MM, OA C/o nose bleeds   Outpatient Medications Prior to Visit  Medication Sig Dispense Refill  . acetaminophen (TYLENOL) 325 MG tablet Take 650 mg by mouth every 6 (six) hours as needed.    . ALPRAZolam (XANAX) 0.5 MG tablet Take 1 tablet (0.5 mg total) by mouth 2 (two) times daily. 60 tablet 1  . amLODipine (NORVASC) 5 MG tablet Take 5 mg by mouth daily.    . diclofenac sodium (VOLTAREN) 1 % GEL Apply 2 g topically 4 (four) times daily. 1 Tube 0  . feeding supplement, ENSURE ENLIVE, (ENSURE ENLIVE) LIQD Take 237 mLs by mouth 2 (two) times daily between meals. 237 mL 12  . HYDROcodone-acetaminophen (NORCO/VICODIN) 5-325 MG tablet Take 1 tablet by mouth every 12 (twelve) hours as needed for moderate pain.    . hydrOXYzine (ATARAX/VISTARIL) 25 MG tablet Take 25 mg by mouth at bedtime.    . Multiple Vitamin (MULTIVITAMIN) tablet Take 1 tablet by mouth daily.      . QUEtiapine (SEROQUEL) 25 MG tablet Take 25 mg by mouth at bedtime.    . pantoprazole (PROTONIX) 40 MG tablet Take 1 tablet (40 mg total) by mouth daily. 30 tablet 0  . ciprofloxacin (CIPRO) 500 MG tablet Take 500 mg by mouth every 12 (twelve) hours. For 5 more days    . predniSONE (DELTASONE) 10 MG tablet Take 10 mg by mouth daily with breakfast. For 2 more days     No facility-administered medications prior to visit.     ROS: Review of Systems  Constitutional: Positive for fatigue. Negative for activity change, appetite change, chills and unexpected weight change.  HENT: Positive for nosebleeds and rhinorrhea. Negative for congestion, mouth sores and sinus pressure.   Eyes: Negative for visual disturbance.  Respiratory: Negative for cough and chest tightness.   Gastrointestinal: Negative for abdominal pain and nausea.  Genitourinary: Negative  for difficulty urinating, frequency and vaginal pain.  Musculoskeletal: Positive for back pain. Negative for gait problem.  Skin: Negative for pallor and rash.  Neurological: Positive for dizziness, weakness and headaches. Negative for tremors and numbness.  Psychiatric/Behavioral: Positive for decreased concentration. Negative for confusion, sleep disturbance and suicidal ideas.    Objective:  BP 120/70 (BP Location: Left Arm, Patient Position: Sitting, Cuff Size: Normal)   Pulse 98   Temp 97.8 F (36.6 C) (Oral)   Ht 5\' 7"  (1.702 m)   SpO2 98%   BMI 29.76 kg/m   BP Readings from Last 3 Encounters:  12/24/17 120/70  12/17/17 (!) 115/51  12/09/17 124/77    Wt Readings from Last 3 Encounters:  11/18/17 190 lb (86.2 kg)  11/11/17 193 lb (87.5 kg)  09/04/17 199 lb (90.3 kg)    Physical Exam  Constitutional: She appears well-developed. No distress.  HENT:  Head: Normocephalic.  Right Ear: External ear normal.  Left Ear: External ear normal.  Nose: Nose normal.  Mouth/Throat: Oropharynx is clear and moist.  Eyes: Pupils are equal, round, and reactive to light. Conjunctivae are normal. Right eye exhibits no discharge. Left eye exhibits no discharge.  Neck: Normal range of motion. Neck supple. No JVD present. No tracheal deviation present. No thyromegaly present.  Cardiovascular: Normal rate, regular rhythm and normal heart sounds.  Pulmonary/Chest: No stridor. No respiratory distress.  She has no wheezes.  Abdominal: Soft. Bowel sounds are normal. She exhibits no distension and no mass. There is no tenderness. There is no rebound and no guarding.  Musculoskeletal: She exhibits tenderness. She exhibits no edema.  Lymphadenopathy:    She has no cervical adenopathy.  Neurological: She displays normal reflexes. No cranial nerve deficit. She exhibits normal muscle tone. Coordination abnormal.  Skin: No rash noted. No erythema.  Psychiatric: She has a normal mood and affect. Her  behavior is normal. Judgment and thought content normal.  pale Blood in B nostrils In a w/c    Lab Results  Component Value Date   WBC 6.1 12/17/2017   HGB 6.3 (LL) 12/17/2017   HCT 20.7 (L) 12/17/2017   PLT 208 12/17/2017   GLUCOSE 102 (H) 12/17/2017   CHOL 129 09/06/2012   TRIG 63.0 09/06/2012   HDL 33.90 (L) 09/06/2012   LDLCALC 83 09/06/2012   ALT 10 12/17/2017   AST 21 12/17/2017   NA 135 12/17/2017   K 3.8 12/17/2017   CL 109 12/17/2017   CREATININE 1.31 (H) 12/17/2017   BUN 13 12/17/2017   CO2 24 12/17/2017   TSH 0.58 03/21/2016   INR 1.04 03/16/2015   HGBA1C 5.7 08/11/2011    Ct Angio Head W Or Wo Contrast  Result Date: 11/03/2017 CLINICAL DATA:  Dizziness, headache, imbalance, and generalized weakness for 1 month. EXAM: CT ANGIOGRAPHY HEAD AND NECK TECHNIQUE: Multidetector CT imaging of the head and neck was performed using the standard protocol during bolus administration of intravenous contrast. Multiplanar CT image reconstructions and MIPs were obtained to evaluate the vascular anatomy. Carotid stenosis measurements (when applicable) are obtained utilizing NASCET criteria, using the distal internal carotid diameter as the denominator. CONTRAST:  40mL ISOVUE-370 IOPAMIDOL (ISOVUE-370) INJECTION 76% COMPARISON:  Neck CTA 04/04/2011.  Brain MRI 01/02/2010. FINDINGS: CT HEAD FINDINGS Brain: There is no evidence of acute infarct, intracranial hemorrhage, mass, midline shift, or extra-axial fluid collection. The ventricles and sulci are normal for age. Patchy cerebral white matter hypodensities are nonspecific but compatible with chronic small vessel ischemic disease, mild-to-moderate for age and similar in distribution to the T2 hyperintensities on the prior MRI. Vascular: Calcified atherosclerosis at the skull base. No hyperdense vessel. Skull: No fracture or focal osseous lesion. Sinuses: Visualized paranasal sinuses and mastoid air cells are clear. Orbits: Unremarkable.  Review of the MIP images confirms the above findings CTA NECK FINDINGS Aortic arch: There is a standard 3 vessel aortic arch. Arch vessel origins are widely patent. Right carotid system: Patent without evidence of stenosis or dissection. Mild atherosclerotic plaque in the carotid bulb. Tortuous proximal common carotid artery and mid cervical ICA. Left carotid system: Patent without evidence of stenosis or dissection. Mild calcified plaque at the carotid bifurcation. Vertebral arteries: Patent without evidence of stenosis or dissection. Strongly dominant left vertebral artery with focal nonstenotic calcified plaque at its origin. Skeleton: Chronically advanced disc degeneration at C6-7 greater than C5-6. Moderate to advanced cervical facet arthrosis with unchanged grade 1 anterolisthesis of C4 on C5. Other neck: Similar appearance of left thyroid goiter which posteriorly displaces the proximal left common carotid and left subclavian arteries. Upper chest: Clear lung apices. Review of the MIP images confirms the above findings CTA HEAD FINDINGS Anterior circulation: The internal carotid arteries are patent from skull base to carotid termini with mild nonstenotic siphon atherosclerosis bilaterally. ACAs and MCAs are patent without evidence of proximal branch occlusion or flow limiting proximal stenosis. There may be a  scratched of there may be an incidental fenestration of the distal right A1 segment, a normal variant. No aneurysm is identified. Posterior circulation: The intracranial vertebral arteries are patent to the basilar without focal stenosis. The right vertebral artery is particularly hypoplastic distal to the PICA origin. Patent PICA, AICA, and SCA origins are identified bilaterally. The basilar artery is widely patent. There are patent posterior communicating arteries bilaterally, right slightly larger than left. PCAs are patent without evidence of significant stenosis. No aneurysm is identified. Venous  sinuses: Patent. Anatomic variants: None of significance. Delayed phase: No abnormal enhancement. Review of the MIP images confirms the above findings IMPRESSION: 1. No evidence of acute intracranial abnormality. 2. Mild-to-moderate chronic small vessel ischemic disease. 3. Mild intracranial and cervical atherosclerosis without major vessel occlusion or significant stenosis. Electronically Signed   By: Logan Bores M.D.   On: 11/03/2017 17:12   Ct Angio Neck W And/or Wo Contrast  Result Date: 11/03/2017 CLINICAL DATA:  Dizziness, headache, imbalance, and generalized weakness for 1 month. EXAM: CT ANGIOGRAPHY HEAD AND NECK TECHNIQUE: Multidetector CT imaging of the head and neck was performed using the standard protocol during bolus administration of intravenous contrast. Multiplanar CT image reconstructions and MIPs were obtained to evaluate the vascular anatomy. Carotid stenosis measurements (when applicable) are obtained utilizing NASCET criteria, using the distal internal carotid diameter as the denominator. CONTRAST:  11mL ISOVUE-370 IOPAMIDOL (ISOVUE-370) INJECTION 76% COMPARISON:  Neck CTA 04/04/2011.  Brain MRI 01/02/2010. FINDINGS: CT HEAD FINDINGS Brain: There is no evidence of acute infarct, intracranial hemorrhage, mass, midline shift, or extra-axial fluid collection. The ventricles and sulci are normal for age. Patchy cerebral white matter hypodensities are nonspecific but compatible with chronic small vessel ischemic disease, mild-to-moderate for age and similar in distribution to the T2 hyperintensities on the prior MRI. Vascular: Calcified atherosclerosis at the skull base. No hyperdense vessel. Skull: No fracture or focal osseous lesion. Sinuses: Visualized paranasal sinuses and mastoid air cells are clear. Orbits: Unremarkable. Review of the MIP images confirms the above findings CTA NECK FINDINGS Aortic arch: There is a standard 3 vessel aortic arch. Arch vessel origins are widely patent. Right  carotid system: Patent without evidence of stenosis or dissection. Mild atherosclerotic plaque in the carotid bulb. Tortuous proximal common carotid artery and mid cervical ICA. Left carotid system: Patent without evidence of stenosis or dissection. Mild calcified plaque at the carotid bifurcation. Vertebral arteries: Patent without evidence of stenosis or dissection. Strongly dominant left vertebral artery with focal nonstenotic calcified plaque at its origin. Skeleton: Chronically advanced disc degeneration at C6-7 greater than C5-6. Moderate to advanced cervical facet arthrosis with unchanged grade 1 anterolisthesis of C4 on C5. Other neck: Similar appearance of left thyroid goiter which posteriorly displaces the proximal left common carotid and left subclavian arteries. Upper chest: Clear lung apices. Review of the MIP images confirms the above findings CTA HEAD FINDINGS Anterior circulation: The internal carotid arteries are patent from skull base to carotid termini with mild nonstenotic siphon atherosclerosis bilaterally. ACAs and MCAs are patent without evidence of proximal branch occlusion or flow limiting proximal stenosis. There may be a scratched of there may be an incidental fenestration of the distal right A1 segment, a normal variant. No aneurysm is identified. Posterior circulation: The intracranial vertebral arteries are patent to the basilar without focal stenosis. The right vertebral artery is particularly hypoplastic distal to the PICA origin. Patent PICA, AICA, and SCA origins are identified bilaterally. The basilar artery is widely patent. There are  patent posterior communicating arteries bilaterally, right slightly larger than left. PCAs are patent without evidence of significant stenosis. No aneurysm is identified. Venous sinuses: Patent. Anatomic variants: None of significance. Delayed phase: No abnormal enhancement. Review of the MIP images confirms the above findings IMPRESSION: 1. No  evidence of acute intracranial abnormality. 2. Mild-to-moderate chronic small vessel ischemic disease. 3. Mild intracranial and cervical atherosclerosis without major vessel occlusion or significant stenosis. Electronically Signed   By: Logan Bores M.D.   On: 11/03/2017 17:12    Assessment & Plan:   There are no diagnoses linked to this encounter.   No orders of the defined types were placed in this encounter.    Follow-up: No follow-ups on file.  Walker Kehr, MD

## 2017-12-24 NOTE — Assessment & Plan Note (Signed)
Wt Readings from Last 3 Encounters:  11/18/17 190 lb (86.2 kg)  11/11/17 193 lb (87.5 kg)  09/04/17 199 lb (90.3 kg)

## 2017-12-24 NOTE — Assessment & Plan Note (Signed)
ENT ref 

## 2017-12-24 NOTE — Patient Instructions (Signed)
Use Afrin nasal spray on a cotton wick/Q tip for nose bleeds

## 2017-12-25 ENCOUNTER — Telehealth: Payer: Self-pay | Admitting: Internal Medicine

## 2017-12-25 DIAGNOSIS — D126 Benign neoplasm of colon, unspecified: Secondary | ICD-10-CM | POA: Diagnosis not present

## 2017-12-25 DIAGNOSIS — M1991 Primary osteoarthritis, unspecified site: Secondary | ICD-10-CM | POA: Diagnosis not present

## 2017-12-25 DIAGNOSIS — M25561 Pain in right knee: Secondary | ICD-10-CM | POA: Diagnosis not present

## 2017-12-25 DIAGNOSIS — C9000 Multiple myeloma not having achieved remission: Secondary | ICD-10-CM | POA: Diagnosis not present

## 2017-12-25 DIAGNOSIS — S31831D Laceration without foreign body of anus, subsequent encounter: Secondary | ICD-10-CM | POA: Diagnosis not present

## 2017-12-25 DIAGNOSIS — I10 Essential (primary) hypertension: Secondary | ICD-10-CM | POA: Diagnosis not present

## 2017-12-25 NOTE — Telephone Encounter (Signed)
Informed Lori. She will try to get patient in sooner.

## 2017-12-25 NOTE — Telephone Encounter (Signed)
Copied from [place]. Topic: Quick Communication - See Telephone Encounter >> Dec 25, 2017  9:57 AM Neva Seat wrote: The ENT that pt was referrred to cannot see pt until Sept.  The ENT office stated Dr. Mamie Nick. May need to referrer her to another ENT to see her sooner.

## 2017-12-28 ENCOUNTER — Non-Acute Institutional Stay: Payer: Medicare Other | Admitting: Licensed Clinical Social Worker

## 2017-12-28 ENCOUNTER — Non-Acute Institutional Stay: Payer: Medicare Other | Admitting: *Deleted

## 2017-12-28 DIAGNOSIS — K648 Other hemorrhoids: Secondary | ICD-10-CM

## 2017-12-28 DIAGNOSIS — Z515 Encounter for palliative care: Secondary | ICD-10-CM

## 2017-12-28 DIAGNOSIS — M1991 Primary osteoarthritis, unspecified site: Secondary | ICD-10-CM | POA: Diagnosis not present

## 2017-12-28 DIAGNOSIS — Z8673 Personal history of transient ischemic attack (TIA), and cerebral infarction without residual deficits: Secondary | ICD-10-CM | POA: Diagnosis not present

## 2017-12-28 DIAGNOSIS — S31831D Laceration without foreign body of anus, subsequent encounter: Secondary | ICD-10-CM | POA: Diagnosis not present

## 2017-12-28 DIAGNOSIS — F29 Unspecified psychosis not due to a substance or known physiological condition: Secondary | ICD-10-CM | POA: Diagnosis not present

## 2017-12-28 DIAGNOSIS — I10 Essential (primary) hypertension: Secondary | ICD-10-CM | POA: Diagnosis not present

## 2017-12-28 DIAGNOSIS — M25561 Pain in right knee: Secondary | ICD-10-CM | POA: Diagnosis not present

## 2017-12-28 DIAGNOSIS — Z87891 Personal history of nicotine dependence: Secondary | ICD-10-CM

## 2017-12-28 DIAGNOSIS — Z6829 Body mass index (BMI) 29.0-29.9, adult: Secondary | ICD-10-CM | POA: Diagnosis not present

## 2017-12-28 DIAGNOSIS — C9000 Multiple myeloma not having achieved remission: Secondary | ICD-10-CM | POA: Diagnosis not present

## 2017-12-28 DIAGNOSIS — D126 Benign neoplasm of colon, unspecified: Secondary | ICD-10-CM | POA: Diagnosis not present

## 2017-12-28 NOTE — Progress Notes (Signed)
COMMUNITY PALLIATIVE CARE RN NOTE  PATIENT NAME: Mindy Taylor DOB: 1929-03-04 MRN: [id number]  PRIMARY CARE PROVIDER: Cassandria Anger, MD  RESPONSIBLE PARTY:  Acct ID - Guarantor Home Phone Work Phone Relationship Acct Type  [id number] - [place]* [phone]  Self P/F     [facility], [place], [place] [address]    PLAN OF CARE and INTERVENTION:  1. ADVANCE CARE PLANNING/GOALS OF CARE: Remain at [facility], avoid hospitalizations 2. PATIENT/CAREGIVER EDUCATION: Reinforced Safety Precautions 3. DISEASE STATUS: Joint visit made with [place]. Niece, Ovella present during visit. Patient sitting up in her wheelchair awake and alert. She is very pleasant and conversational. Denies pain. States that some tasks are difficult for her to perform such as dressing and toileting, as well as reaching her clothes in her closet. She requires assistance with all of these tasks. Patient is able to stand using her walker, but when travelling outside of her room, propels herself in her wheelchair. Intake is good. She recently transferred from [facility] and [place] and now resides at [facility]. She seems to be adjusting well. She recently had an Epoetin infusion on 12/10/17. She was recently seen by Dr. Marin Olp on 12/17/17 for a follow up visit regarding her Multiple Myeloma and possible treatment with chemotherapy. Niece and patient to give final decision on next visit to MD. Last Hgb 6.3. Continues with intermittent GI bleeding. Will continue to monitor.   HISTORY OF PRESENT ILLNESS: This is a 84 yo female who resides at [facility]. Palliative Care to continue to follow.    CODE STATUS: DNR  ADVANCED DIRECTIVES: N MOST FORM: Yes PPS: 40%   PHYSICAL EXAM:    LUNGS: clear to auscultation  CARDIAC: Cor RRR EXTREMITIES: No edema SKIN: Exposed skin dry and intact  NEURO: Alert and oriented x 3, pleasant mood, generalized weakness, mainly  self propels in wheelchair   (Duration of visit and documentation 75 minutes)    Daryl Eastern, RN, BSN

## 2017-12-28 NOTE — Progress Notes (Signed)
COMMUNITY PALLIATIVE CARE SW NOTE  PATIENT NAME: Mabry Santarelli Hollenkamp DOB: Oct 25, 1928 MRN: [id number]  PRIMARY CARE PROVIDER: Cassandria Anger, MD  RESPONSIBLE PARTY:  Acct ID - Guarantor Home Phone Work Phone Relationship Acct Type  [id number] - [place]* [phone]  Self P/F     [facility], [place], [place] [address]     PLAN OF CARE and INTERVENTIONS:             1. GOALS OF CARE/ ADVANCE CARE PLANNING:  Goal is for patient to be comfortable and cared for in a timely manner.  Patient has a DNR and MOST form. 2. SOCIAL/EMOTIONAL/SPIRITUAL ASSESSMENT/ INTERVENTIONS:  SW and Palliative Care RN, Daryl Eastern, met with patient and her niece, Kemi, at [facility].  SW assessed patient's adjustment to the facility after her move from SNF.  Her affect was brighter during this visit and she engaged more extensively in conversation.  She discussed members of her church congregation visiting her often.  Patient is of [place].  Patient was able to identify adjusting better since SW's last visit.  Patient is very appreciative of her niece's care. 3. PATIENT/CAREGIVER EDUCATION/ COPING:  Provided education to patient and her niece regarding the Palliative Care program. 4. PERSONAL EMERGENCY PLAN:  Per facility protocol. 5. COMMUNITY RESOURCES COORDINATION/ HEALTH CARE NAVIGATION:  None per niece. 6. FINANCIAL/LEGAL CONCERNS/INTERVENTIONS:  None per niece.     SOCIAL HX:  Social History   Tobacco Use  . Smoking status: Former Research scientist (life sciences)  . Smokeless tobacco: Never Used  . Tobacco comment: Not sure when; many, many years   Substance Use Topics  . Alcohol use: No    Alcohol/week: 0.0 oz    CODE STATUS:  DNR ADVANCED DIRECTIVES: N MOST FORM COMPLETE:  Yes HOSPICE EDUCATION PROVIDED:  N  PPS:  Patient's appetite is normal.  She is able to move herself around in her w/c. Duration of visit and documentation:  75 minutes.      Creola Corn Stesha Neyens, LCSW

## 2017-12-29 DIAGNOSIS — S31831D Laceration without foreign body of anus, subsequent encounter: Secondary | ICD-10-CM | POA: Diagnosis not present

## 2017-12-29 DIAGNOSIS — M1991 Primary osteoarthritis, unspecified site: Secondary | ICD-10-CM | POA: Diagnosis not present

## 2017-12-29 DIAGNOSIS — M25561 Pain in right knee: Secondary | ICD-10-CM | POA: Diagnosis not present

## 2017-12-29 DIAGNOSIS — D126 Benign neoplasm of colon, unspecified: Secondary | ICD-10-CM | POA: Diagnosis not present

## 2017-12-29 DIAGNOSIS — C9000 Multiple myeloma not having achieved remission: Secondary | ICD-10-CM | POA: Diagnosis not present

## 2017-12-29 DIAGNOSIS — I10 Essential (primary) hypertension: Secondary | ICD-10-CM | POA: Diagnosis not present

## 2017-12-30 ENCOUNTER — Ambulatory Visit: Payer: Medicare Other | Admitting: Family

## 2017-12-30 ENCOUNTER — Ambulatory Visit: Payer: Medicare Other

## 2017-12-30 ENCOUNTER — Other Ambulatory Visit: Payer: Medicare Other

## 2017-12-31 ENCOUNTER — Ambulatory Visit: Payer: Medicare Other

## 2017-12-31 DIAGNOSIS — D126 Benign neoplasm of colon, unspecified: Secondary | ICD-10-CM | POA: Diagnosis not present

## 2017-12-31 DIAGNOSIS — S31831D Laceration without foreign body of anus, subsequent encounter: Secondary | ICD-10-CM | POA: Diagnosis not present

## 2017-12-31 DIAGNOSIS — C9000 Multiple myeloma not having achieved remission: Secondary | ICD-10-CM | POA: Diagnosis not present

## 2017-12-31 DIAGNOSIS — M25561 Pain in right knee: Secondary | ICD-10-CM | POA: Diagnosis not present

## 2017-12-31 DIAGNOSIS — M1991 Primary osteoarthritis, unspecified site: Secondary | ICD-10-CM | POA: Diagnosis not present

## 2017-12-31 DIAGNOSIS — I10 Essential (primary) hypertension: Secondary | ICD-10-CM | POA: Diagnosis not present

## 2018-01-01 DIAGNOSIS — M1991 Primary osteoarthritis, unspecified site: Secondary | ICD-10-CM | POA: Diagnosis not present

## 2018-01-01 DIAGNOSIS — M25561 Pain in right knee: Secondary | ICD-10-CM | POA: Diagnosis not present

## 2018-01-01 DIAGNOSIS — I10 Essential (primary) hypertension: Secondary | ICD-10-CM | POA: Diagnosis not present

## 2018-01-01 DIAGNOSIS — C9000 Multiple myeloma not having achieved remission: Secondary | ICD-10-CM | POA: Diagnosis not present

## 2018-01-01 DIAGNOSIS — S31831D Laceration without foreign body of anus, subsequent encounter: Secondary | ICD-10-CM | POA: Diagnosis not present

## 2018-01-01 DIAGNOSIS — D126 Benign neoplasm of colon, unspecified: Secondary | ICD-10-CM | POA: Diagnosis not present

## 2018-01-03 ENCOUNTER — Encounter: Payer: Self-pay | Admitting: Internal Medicine

## 2018-01-03 NOTE — Progress Notes (Signed)
Location:   [facility]'s clerk of Service:   Skilled nursing facility Provider: Hennie Duos MD  Plotnikov, Evie Lacks, MD  Patient Care Team: Cassandria Anger, MD as PCP - General Berle Mull, MD as Attending Physician (Family Medicine) [name], MD as Consulting Physician (Internal Medicine) [name], MD as Rounding Team (Internal Medicine) Duffy, Creola Corn, LCSW as Social Worker (Licensed Clinical Social Worker)  Extended Emergency Contact Information Primary Emergency Contact: [name],Luca Address: [address] [place]          [place], [place] [address] [place] Phone: [phone] Mobile Phone: [phone] Relation: Niece Secondary Emergency Contact: [name] [place] Phone: [phone] Relation: Other    Allergies: Bee venom; Ceftin [cefuroxime axetil]; Other; and Spider antivenin [black widow spider antivenin (l.mactans)]  Chief Complaint  Patient presents with  . Acute Visit    HPI: Patient is 84 y.o. female who is being seen for the return of a urine that the nurses drew for patient did have urinary symptoms.  Culture grew out greater than 100,000 of enterococci falceum patient has not had any fever chills nausea vomiting or any other systemic symptom.  Past Medical History:  Diagnosis Date  . ANXIETY 03/05/2007  . ATAXIA 12/26/2009  . BEE STING 03/05/2007  . BRONCHITIS, ACUTE 06/28/2010  . CANDIDIASIS, VAGINAL 09/08/2008  . CEREBROVASCULAR ACCIDENT, HX OF 01/04/2010  . CERUMEN IMPACTION 06/05/2008  . DIZZINESS 12/26/2009  . Epistaxis 06/05/2008  . Goals of care, counseling/discussion 11/12/2017  . Headache(784.0) 06/05/2008  . HYPERLIPIDEMIA 12/08/2006  . HYPERTENSION 12/08/2006  . 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 Multiple myeloma ([place]) 04/03/2015  . NECK PAIN 06/05/2008  . OBESITY 09/07/2009  . ONYCHOMYCOSIS 03/09/2009  . OSTEOARTHRITIS 12/08/2006  . PARESTHESIA 12/26/2009  . TOBACCO USE, QUIT 03/09/2009     Past Surgical History:  Procedure Laterality Date  . ABDOMINAL HYSTERECTOMY    . BIOPSY  11/05/2017   Procedure: BIOPSY;  Surgeon: Yetta Flock, MD;  Location: [facility];  Service: Gastroenterology;;  . BREAST SURGERY    . COLONOSCOPY WITH PROPOFOL N/A 11/05/2017   Procedure: COLONOSCOPY WITH PROPOFOL;  Surgeon: Yetta Flock, MD;  Location: [facility];  Service: Gastroenterology;  Laterality: N/A;  spot tattoo   . ESOPHAGOGASTRODUODENOSCOPY (EGD) WITH PROPOFOL N/A 11/05/2017   Procedure: ESOPHAGOGASTRODUODENOSCOPY (EGD) WITH PROPOFOL;  Surgeon: Yetta Flock, MD;  Location: [facility];  Service: Gastroenterology;  Laterality: N/A;  APC  . SUBMUCOSAL INJECTION  11/05/2017   Procedure: SUBMUCOSAL INJECTION;  Surgeon: Yetta Flock, MD;  [facility]: [facility];  Service: Gastroenterology;;    Allergies as of 12/08/2017      Reactions   Bee Venom Swelling   Swelling at site of sting   Ceftin [cefuroxime Axetil] Other (See Comments)   Made the patient "feel weird"   Other    PT is a Jehovah Witness and wants no blood products.   Spider Antivenin [black Widow Spider Antivenin (l.mactans)] Swelling, Other (See Comments)   Pain and swelling at site of bite      Medication List        Accurate as of 12/08/17 11:59 PM. Always use your most recent med list.          acetaminophen 325 MG tablet Commonly known as:  TYLENOL Take 650 mg by mouth every 6 (six) hours as needed.   diclofenac sodium 1 % Gel Commonly known as:  VOLTAREN Apply 2 g topically 4 (four)  times daily.   feeding supplement (ENSURE ENLIVE) Liqd Take 237 mLs by mouth 2 (two) times daily between meals.   multivitamin tablet Take 1 tablet by mouth daily.   pantoprazole 40 MG tablet Commonly known as:  PROTONIX Take 1 tablet (40 mg total) by mouth daily.       No orders of the defined types were placed in this encounter.   Immunization History  Administered Date(s)  Administered  . Influenza Split 02/18/2011, 03/19/2012  . Influenza Whole 03/09/2009, 02/18/2010  . Influenza, High Dose Seasonal PF 03/21/2016, 03/27/2017  . Influenza,inj,Quad PF,6+ Mos 03/04/2013, 02/01/2014, 02/01/2015  . Pneumococcal Conjugate-13 08/10/2015  . Pneumococcal Polysaccharide-23 12/31/2004, 09/17/2016  . Tdap 04/20/2017    Social History   Tobacco Use  . Smoking status: Former Research scientist (life sciences)  . Smokeless tobacco: Never Used  . Tobacco comment: Not sure when; many, many years   Substance Use Topics  . Alcohol use: No    Alcohol/week: 0.0 standard drinks    Review of Systems  DATA OBTAINED: from patient, nurse GENERAL:  no fevers, fatigue, appetite changes SKIN: No itching, rash HEENT: No complaint RESPIRATORY: No cough, wheezing, SOB CARDIAC: No chest pain, palpitations, lower extremity edema  GI: No abdominal pain, No N/V/D or constipation, No heartburn or reflux  GU: No dysuria, frequency or urgency, or incontinence  MUSCULOSKELETAL: No unrelieved bone/joint pain NEUROLOGIC: No headache, dizziness  PSYCHIATRIC: No overt anxiety or sadness  Vitals:   01/03/18 1518  BP: 124/77  Pulse: 90  Resp: 19  Temp: 98.3 F (36.8 C)   There is no height or weight on file to calculate BMI. Physical Exam  GENERAL APPEARANCE: Alert, laterally conversant, No acute distress  SKIN: No diaphoresis rash HEENT: Unremarkable RESPIRATORY: Breathing is even, unlabored. Lung sounds are clear   CARDIOVASCULAR: Heart RRR no murmurs, rubs or gallops. No peripheral edema  GASTROINTESTINAL: Abdomen is soft, non-tender, not distended w/ normal bowel sounds.  GENITOURINARY: Bladder non tender, not distended  MUSCULOSKELETAL: No abnormal joints or musculature NEUROLOGIC: Cranial nerves 2-12 grossly intact. Moves all extremities PSYCHIATRIC: Mood and affect normal, no behavioral issues  Patient Active Problem List   Diagnosis Date Noted  . Iron deficiency anemia 11/21/2017  . Acute  kidney injury ([place]) 11/21/2017  . Hyponatremia 11/21/2017  . Hypokalemia 11/21/2017  . Metabolic encephalopathy 25/36/6440  . GERD (gastroesophageal reflux disease) 11/21/2017  . Angiodysplasia of stomach 11/21/2017  . Psychoses ([place]) 11/21/2017  . Patient is Jehovah's Witness 11/21/2017  . Goals of care, counseling/discussion 11/12/2017  . Colonic mass   . Anemia 11/03/2017  . Insomnia 06/01/2017  . Contusion of left leg 04/20/2017  . Sore in nose 06/03/2016  . Cough 05/22/2016  . HLD (hyperlipidemia)   . Cellulitis 12/24/2015  . Cellulitis of left upper extremity 12/23/2015  . Well adult exam 11/19/2015  . Loss of weight 10/12/2015  . Normocytic anemia 05/07/2015  . Multiple myeloma ([place]) 04/03/2015  . Arthralgia 02/01/2015  . Right wrist pain 12/01/2014  . Arm pain, left 11/22/2014  . No blood products 03/11/2014  . Right knee pain 07/26/2013  . Chest pain 04/26/2013  . Left ankle swelling 03/04/2013  . Clostridium difficile diarrhea 10/22/2012  . Right hand pain 09/08/2012  . Elbow pain, right 03/15/2012  . Knee pain, left 10/02/2011  . Hypercalcemia 10/02/2011  . Hyperglycemia 08/08/2011  . Neck pain, chronic 05/05/2011  . Knee pain, bilateral 02/18/2011  . Fatigue 01/03/2011  . Bronchitis, acute, with bronchospasm 12/11/2010  . Wheezing 11/28/2010  .  Preventative health care 11/28/2010  . Cerebral artery occlusion with cerebral infarction ([place]) 01/04/2010  . CVA (cerebral vascular accident) ([place]) 01/04/2010  . DIZZINESS 12/26/2009  . ATAXIA 12/26/2009  . PARESTHESIA 12/26/2009  . Morbid obesity due to excess calories ([place]) 09/07/2009  . ONYCHOMYCOSIS 03/09/2009  . TOBACCO USE, QUIT 03/09/2009  . CANDIDIASIS, VAGINAL 09/08/2008  . CERUMEN IMPACTION 06/05/2008  . Headache(784.0) 06/05/2008  . Epistaxis 06/05/2008  . Anxiety state 03/05/2007  . BEE STING 03/05/2007  . Dyslipidemia 12/08/2006  . Essential hypertension 12/08/2006  . Osteoarthritis 12/08/2006      CMP     Component Value Date/Time   NA 135 12/17/2017 1143   NA 140 12/11/2015 1002   K 3.8 12/17/2017 1143   K 3.8 12/11/2015 1002   CL 109 12/17/2017 1143   CL 107 10/08/2015 0956   CO2 24 12/17/2017 1143   CO2 28 12/11/2015 1002   GLUCOSE 102 (H) 12/17/2017 1143   GLUCOSE 78 12/11/2015 1002   GLUCOSE 103 10/08/2015 0956   BUN 13 12/17/2017 1143   BUN 12.6 12/11/2015 1002   CREATININE 1.31 (H) 12/17/2017 1143   CREATININE 0.8 12/11/2015 1002   CALCIUM 8.3 (L) 12/17/2017 1143   CALCIUM 9.2 12/11/2015 1002   PROT 12.2 (H) 12/17/2017 1143   PROT 7.0 12/11/2015 1002   PROT 7.7 12/11/2015 1002   ALBUMIN 2.3 (L) 12/17/2017 1143   ALBUMIN 3.5 12/11/2015 1002   AST 21 12/17/2017 1143   AST 17 12/11/2015 1002   ALT 10 12/17/2017 1143   ALT 11 12/11/2015 1002   ALKPHOS 51 12/17/2017 1143   ALKPHOS 85 12/11/2015 1002   BILITOT 0.6 12/17/2017 1143   BILITOT 1.30 (H) 12/11/2015 1002   GFRNONAA 35 (L) 12/17/2017 1143   GFRAA 41 (L) 12/17/2017 1143   Recent Labs    11/15/17 0424 11/16/17 0508 12/17/17 1143  NA 129* 133* 135  K 3.2* 3.0* 3.8  CL 102 107 109  CO2 '26 24 24  '$ GLUCOSE 96 90 102*  BUN 30* 21* 13  CREATININE 1.73* 1.52* 1.31*  CALCIUM 9.0 8.4* 8.3*   Recent Labs    11/12/17 0340 11/13/17 0343 12/17/17 1143  AST 12* 13* 21  ALT 10* 11* 10  ALKPHOS 45 47 51  BILITOT 1.2 1.1 0.6  PROT 11.4* 11.6* 12.2*  ALBUMIN 2.3* 2.2* 2.3*   Recent Labs    11/11/17 0707 11/12/17 0340 11/12/17 1249 11/16/17 0508 12/17/17 1143  WBC 6.1 6.4  --   --  6.1  NEUTROABS 3.4 3.4  --   --  3.5  HGB 6.1* 6.1* 6.2* 5.7* 6.3*  HCT 18.8* 19.3* 19.2* 17.9* 20.7*  MCV 103.3* 103.8*  --   --  108.9*  PLT 152 180  --   --  208   No results for input(s): CHOL, LDLCALC, TRIG in the last 8760 hours.  Invalid input(s): HCL No results found for: [facility] Lab Results  Component Value Date   TSH 0.58 03/21/2016   Lab Results  Component Value Date   HGBA1C 5.7  08/11/2011   Lab Results  Component Value Date   CHOL 129 09/06/2012   HDL 33.90 (L) 09/06/2012   LDLCALC 83 09/06/2012   TRIG 63.0 09/06/2012   CHOLHDL 4 09/06/2012    Significant Diagnostic Results in last 30 days:  No results found.  Assessment and Plan  Enterococcus UTI-greater than 100,000; patient's calculated creatinine clearance is 62.02; patient will be treated with Cipro 500 mg every 12  x7 days     Inocencio Homes, MD

## 2018-01-05 DIAGNOSIS — M25561 Pain in right knee: Secondary | ICD-10-CM | POA: Diagnosis not present

## 2018-01-05 DIAGNOSIS — S31831D Laceration without foreign body of anus, subsequent encounter: Secondary | ICD-10-CM | POA: Diagnosis not present

## 2018-01-05 DIAGNOSIS — D126 Benign neoplasm of colon, unspecified: Secondary | ICD-10-CM | POA: Diagnosis not present

## 2018-01-05 DIAGNOSIS — I10 Essential (primary) hypertension: Secondary | ICD-10-CM | POA: Diagnosis not present

## 2018-01-05 DIAGNOSIS — M1991 Primary osteoarthritis, unspecified site: Secondary | ICD-10-CM | POA: Diagnosis not present

## 2018-01-05 DIAGNOSIS — C9000 Multiple myeloma not having achieved remission: Secondary | ICD-10-CM | POA: Diagnosis not present

## 2018-01-06 ENCOUNTER — Ambulatory Visit: Payer: Medicare Other

## 2018-01-06 ENCOUNTER — Other Ambulatory Visit: Payer: Medicare Other

## 2018-01-06 ENCOUNTER — Ambulatory Visit: Payer: Medicare Other | Admitting: Family

## 2018-01-06 DIAGNOSIS — S31831D Laceration without foreign body of anus, subsequent encounter: Secondary | ICD-10-CM | POA: Diagnosis not present

## 2018-01-06 DIAGNOSIS — M25561 Pain in right knee: Secondary | ICD-10-CM | POA: Diagnosis not present

## 2018-01-06 DIAGNOSIS — D126 Benign neoplasm of colon, unspecified: Secondary | ICD-10-CM | POA: Diagnosis not present

## 2018-01-06 DIAGNOSIS — C9000 Multiple myeloma not having achieved remission: Secondary | ICD-10-CM | POA: Diagnosis not present

## 2018-01-06 DIAGNOSIS — I10 Essential (primary) hypertension: Secondary | ICD-10-CM | POA: Diagnosis not present

## 2018-01-06 DIAGNOSIS — M1991 Primary osteoarthritis, unspecified site: Secondary | ICD-10-CM | POA: Diagnosis not present

## 2018-01-07 ENCOUNTER — Ambulatory Visit: Payer: Medicare Other

## 2018-01-07 DIAGNOSIS — M1991 Primary osteoarthritis, unspecified site: Secondary | ICD-10-CM | POA: Diagnosis not present

## 2018-01-07 DIAGNOSIS — D126 Benign neoplasm of colon, unspecified: Secondary | ICD-10-CM | POA: Diagnosis not present

## 2018-01-07 DIAGNOSIS — C9000 Multiple myeloma not having achieved remission: Secondary | ICD-10-CM | POA: Diagnosis not present

## 2018-01-07 DIAGNOSIS — M25561 Pain in right knee: Secondary | ICD-10-CM | POA: Diagnosis not present

## 2018-01-07 DIAGNOSIS — I10 Essential (primary) hypertension: Secondary | ICD-10-CM | POA: Diagnosis not present

## 2018-01-07 DIAGNOSIS — S31831D Laceration without foreign body of anus, subsequent encounter: Secondary | ICD-10-CM | POA: Diagnosis not present

## 2018-01-10 DIAGNOSIS — I10 Essential (primary) hypertension: Secondary | ICD-10-CM | POA: Diagnosis not present

## 2018-01-10 DIAGNOSIS — D126 Benign neoplasm of colon, unspecified: Secondary | ICD-10-CM | POA: Diagnosis not present

## 2018-01-10 DIAGNOSIS — M25561 Pain in right knee: Secondary | ICD-10-CM | POA: Diagnosis not present

## 2018-01-10 DIAGNOSIS — M1991 Primary osteoarthritis, unspecified site: Secondary | ICD-10-CM | POA: Diagnosis not present

## 2018-01-10 DIAGNOSIS — S31831D Laceration without foreign body of anus, subsequent encounter: Secondary | ICD-10-CM | POA: Diagnosis not present

## 2018-01-10 DIAGNOSIS — C9000 Multiple myeloma not having achieved remission: Secondary | ICD-10-CM | POA: Diagnosis not present

## 2018-01-11 ENCOUNTER — Emergency Department (HOSPITAL_COMMUNITY)
Admission: EM | Admit: 2018-01-11 | Discharge: 2018-01-11 | Disposition: A | Discharge status: Home / Self Care | Payer: Medicare Other | Attending: Emergency Medicine | Admitting: Emergency Medicine

## 2018-01-11 ENCOUNTER — Telehealth: Payer: Self-pay | Admitting: Internal Medicine

## 2018-01-11 ENCOUNTER — Encounter (HOSPITAL_COMMUNITY): Payer: Self-pay | Admitting: Emergency Medicine

## 2018-01-11 DIAGNOSIS — I10 Essential (primary) hypertension: Secondary | ICD-10-CM | POA: Diagnosis not present

## 2018-01-11 DIAGNOSIS — R21 Rash and other nonspecific skin eruption: Secondary | ICD-10-CM | POA: Diagnosis not present

## 2018-01-11 DIAGNOSIS — R300 Dysuria: Secondary | ICD-10-CM | POA: Insufficient documentation

## 2018-01-11 DIAGNOSIS — Z87891 Personal history of nicotine dependence: Secondary | ICD-10-CM | POA: Diagnosis not present

## 2018-01-11 DIAGNOSIS — Z8579 Personal history of other malignant neoplasms of lymphoid, hematopoietic and related tissues: Secondary | ICD-10-CM | POA: Diagnosis not present

## 2018-01-11 DIAGNOSIS — Z79899 Other long term (current) drug therapy: Secondary | ICD-10-CM | POA: Insufficient documentation

## 2018-01-11 DIAGNOSIS — Z8673 Personal history of transient ischemic attack (TIA), and cerebral infarction without residual deficits: Secondary | ICD-10-CM | POA: Insufficient documentation

## 2018-01-11 MED ORDER — MICONAZOLE NITRATE 2 % EX CREA: TOPICAL_CREAM | CUTANEOUS | 2 refills | Status: AC

## 2018-01-11 NOTE — Discharge Instructions (Addendum)
Follow-up with your family doctor Thursday or Friday for recheck.  Keep area clean and dry and apply the cream 3-4 times a day

## 2018-01-11 NOTE — ED Triage Notes (Signed)
Pt c/o dysuria and vaginal area very painful for couple days. Pt brought in by family from [facility].

## 2018-01-11 NOTE — Telephone Encounter (Signed)
Pt at ED, have not received form.

## 2018-01-11 NOTE — ED Provider Notes (Signed)
[facility] DEPT Provider Note   CSN: [id number] Arrival date & time: 01/11/18  1402     History   Chief Complaint Chief Complaint  Patient presents with  . Dysuria    HPI Rosela Supak Cundy is a 84 y.o. female.  Patient complains of rash to her vagina for a number of days.  The history is provided by the patient.  Dysuria   This is a new problem. The current episode started 2 days ago. The problem occurs intermittently. The problem has not changed since onset.The quality of the pain is described as burning. The pain is at a severity of 1/10. The pain is mild. There has been no fever. Pertinent negatives include no chills, no frequency and no hematuria.    Past Medical History:  Diagnosis Date  . ANXIETY 03/05/2007  . ATAXIA 12/26/2009  . BEE STING 03/05/2007  . BRONCHITIS, ACUTE 06/28/2010  . CANDIDIASIS, VAGINAL 09/08/2008  . CEREBROVASCULAR ACCIDENT, HX OF 01/04/2010  . CERUMEN IMPACTION 06/05/2008  . DIZZINESS 12/26/2009  . Epistaxis 06/05/2008  . Goals of care, counseling/discussion 11/12/2017  . Headache(784.0) 06/05/2008  . HYPERLIPIDEMIA 12/08/2006  . HYPERTENSION 12/08/2006  . 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 Multiple myeloma ([place]) 04/03/2015  . NECK PAIN 06/05/2008  . OBESITY 09/07/2009  . ONYCHOMYCOSIS 03/09/2009  . OSTEOARTHRITIS 12/08/2006  . PARESTHESIA 12/26/2009  . TOBACCO USE, QUIT 03/09/2009    Patient Active Problem List   Diagnosis Date Noted  . Iron deficiency anemia 11/21/2017  . Acute kidney injury ([place]) 11/21/2017  . Hyponatremia 11/21/2017  . Hypokalemia 11/21/2017  . Metabolic encephalopathy 76/16/0737  . GERD (gastroesophageal reflux disease) 11/21/2017  . Angiodysplasia of stomach 11/21/2017  . Psychoses ([place]) 11/21/2017  . Patient is Jehovah's Witness 11/21/2017  . Goals of care, counseling/discussion 11/12/2017  . Colonic mass   . Anemia 11/03/2017  . Insomnia 06/01/2017  . Contusion of left leg 04/20/2017  . Sore  in nose 06/03/2016  . Cough 05/22/2016  . HLD (hyperlipidemia)   . Cellulitis 12/24/2015  . Cellulitis of left upper extremity 12/23/2015  . Well adult exam 11/19/2015  . Loss of weight 10/12/2015  . Normocytic anemia 05/07/2015  . Multiple myeloma ([place]) 04/03/2015  . Arthralgia 02/01/2015  . Right wrist pain 12/01/2014  . Arm pain, left 11/22/2014  . No blood products 03/11/2014  . Right knee pain 07/26/2013  . Chest pain 04/26/2013  . Left ankle swelling 03/04/2013  . Clostridium difficile diarrhea 10/22/2012  . Right hand pain 09/08/2012  . Elbow pain, right 03/15/2012  . Knee pain, left 10/02/2011  . Hypercalcemia 10/02/2011  . Hyperglycemia 08/08/2011  . Neck pain, chronic 05/05/2011  . Knee pain, bilateral 02/18/2011  . Fatigue 01/03/2011  . Bronchitis, acute, with bronchospasm 12/11/2010  . Wheezing 11/28/2010  . Preventative health care 11/28/2010  . Cerebral artery occlusion with cerebral infarction ([place]) 01/04/2010  . CVA (cerebral vascular accident) ([place]) 01/04/2010  . DIZZINESS 12/26/2009  . ATAXIA 12/26/2009  . PARESTHESIA 12/26/2009  . Morbid obesity due to excess calories ([place]) 09/07/2009  . ONYCHOMYCOSIS 03/09/2009  . TOBACCO USE, QUIT 03/09/2009  . CANDIDIASIS, VAGINAL 09/08/2008  . CERUMEN IMPACTION 06/05/2008  . Headache(784.0) 06/05/2008  . Epistaxis 06/05/2008  . Anxiety state 03/05/2007  . BEE STING 03/05/2007  . Dyslipidemia 12/08/2006  . Essential hypertension 12/08/2006  . Osteoarthritis 12/08/2006    Past Surgical History:  Procedure Laterality Date  . ABDOMINAL HYSTERECTOMY    . BIOPSY  11/05/2017   Procedure:  BIOPSY;  Surgeon: Yetta Flock, MD;  Location: [facility];  Service: Gastroenterology;;  . BREAST SURGERY    . COLONOSCOPY WITH PROPOFOL N/A 11/05/2017   Procedure: COLONOSCOPY WITH PROPOFOL;  Surgeon: Yetta Flock, MD;  [facility]: [facility];  Service: Gastroenterology;  Laterality: N/A;  spot tattoo   .  ESOPHAGOGASTRODUODENOSCOPY (EGD) WITH PROPOFOL N/A 11/05/2017   Procedure: ESOPHAGOGASTRODUODENOSCOPY (EGD) WITH PROPOFOL;  Surgeon: Yetta Flock, MD;  [facility];  Service: Gastroenterology;  Laterality: N/A;  APC  . SUBMUCOSAL INJECTION  11/05/2017   Procedure: SUBMUCOSAL INJECTION;  Surgeon: Yetta Flock, MD;  Location: [facility];  Service: Gastroenterology;;     OB History   None      Home Medications    Prior to Admission medications   Medication Sig Start Date End Date Taking? Authorizing Provider  ALPRAZolam Duanne Moron) 0.5 MG tablet Take 1 tablet (0.5 mg total) by mouth 2 (two) times daily. 12/16/17  Yes Plotnikov, Evie Lacks, MD  amLODipine (NORVASC) 5 MG tablet Take 5 mg by mouth daily.   Yes [provider]  diclofenac sodium (VOLTAREN) 1 % GEL Apply 2 g topically 4 (four) times daily. 11/16/17  Yes Arrien, Jimmy Picket, MD  feeding supplement, ENSURE ENLIVE, (ENSURE ENLIVE) LIQD Take 237 mLs by mouth 2 (two) times daily between meals. 11/16/17  Yes Arrien, Jimmy Picket, MD  gabapentin (NEURONTIN) 100 MG capsule Take 1 capsule (100 mg total) by mouth 2 (two) times daily as needed. Patient taking differently: Take 100 mg by mouth 2 (two) times daily as needed (pain).  12/24/17  Yes Plotnikov, Evie Lacks, MD  guaifenesin (ROBITUSSIN) 100 MG/5ML syrup Take 200 mg by mouth every 4 (four) hours.   Yes [provider]  HYDROcodone-acetaminophen (NORCO/VICODIN) 5-325 MG tablet Take 1 tablet by mouth every 12 (twelve) hours as needed for moderate pain.   Yes [provider]  hydrOXYzine (ATARAX/VISTARIL) 25 MG tablet Take 25 mg by mouth at bedtime.   Yes [provider]  Multiple Vitamin (MULTIVITAMIN) tablet Take 1 tablet by mouth daily.     Yes [provider]  oxymetazoline (AFRIN) 0.05 % nasal spray Place 1 spray into both nostrils 2 (two) times daily as needed for congestion (nose bleeds).   Yes [provider]  pantoprazole (PROTONIX) 40 MG tablet Take 40 mg by mouth daily.  01/03/18  Yes [provider]  QUEtiapine (SEROQUEL) 25 MG tablet Take 25 mg by mouth at bedtime.   Yes [provider]  acetaminophen (TYLENOL) 325 MG tablet Take 650 mg by mouth every 6 (six) hours as needed for moderate pain.     [provider]  miconazole (MICOTIN) 2 % cream Use cream to affected area 3-4 times a day.  Follow-up with your doctor Thursday 01/11/18   Milton Ferguson, MD  pantoprazole (PROTONIX) 40 MG tablet Take 1 tablet (40 mg total) by mouth daily. 11/16/17 12/16/17  Arrien, Jimmy Picket, MD    Family History Family History  Problem Relation Age of Onset  . Hypertension Mother   . Hypertension Father   . Hypertension Other     Social History Social History   Tobacco Use  . Smoking status: Former Research scientist (life sciences)  . Smokeless tobacco: Never Used  . Tobacco comment: Not sure when; many, many years   Substance Use Topics  . Alcohol use: No    Alcohol/week: 0.0 standard drinks  . Drug use: No     Allergies   Bee venom;  Ceftin [cefuroxime axetil]; Other; and Spider antivenin [black widow spider antivenin (l.mactans)]   Review of Systems Review of Systems  Constitutional: Negative for appetite change, chills and fatigue.  HENT: Negative for congestion, ear discharge and sinus pressure.   Eyes: Negative for discharge.  Respiratory: Negative for cough.   Cardiovascular: Negative for chest pain.  Gastrointestinal: Negative for abdominal pain and diarrhea.  Genitourinary: Positive for dysuria. Negative for frequency and hematuria.  Musculoskeletal: Negative for back pain.  Skin: Negative for rash.  Neurological: Negative for seizures and headaches.  Psychiatric/Behavioral: Negative for hallucinations.     Physical Exam Updated Vital Signs BP 120/77   Pulse (!) 101   Temp 99.2 F (37.3 C) (Oral)   Resp 18   SpO2 99%   Physical Exam  Constitutional: She  is oriented to person, place, and time. She appears well-developed.  HENT:  Head: Normocephalic.  Eyes: Conjunctivae and EOM are normal. No scleral icterus.  Neck: Neck supple. No thyromegaly present.  Cardiovascular: Normal rate and regular rhythm. Exam reveals no gallop and no friction rub.  No murmur heard. Pulmonary/Chest: No stridor. She has no wheezes. She has no rales. She exhibits no tenderness.  Abdominal: She exhibits no distension. There is no tenderness. There is no rebound.  Musculoskeletal: Normal range of motion. She exhibits no edema.  Lymphadenopathy:    She has no cervical adenopathy.  Neurological: She is oriented to person, place, and time. She exhibits normal muscle tone. Coordination normal.  Skin: No rash noted. No erythema.  Patient has significant rash to vagina consistent with Candida     ED Treatments / Results  Labs (all labs ordered are listed, but only abnormal results are displayed) Labs Reviewed - No data to display  EKG None  Radiology No results found.  Procedures Procedures (including critical care time)  Medications Ordered in ED Medications - No data to display   Initial Impression / Assessment and Plan / ED Course  I have reviewed the triage vital signs and the nursing notes.  Pertinent labs & imaging results that were available during my care of the patient were reviewed by me and considered in my medical decision making (see chart for details).     Uncle rash to vagina.  Patient is given Monistat and will follow-up with a PCP  Final Clinical Impressions(s) / ED Diagnoses   Final diagnoses:  Rash    ED Discharge Orders         Ordered    miconazole (MICOTIN) 2 % cream     01/11/18 1803           Milton Ferguson, MD 01/11/18 1810

## 2018-01-11 NOTE — Telephone Encounter (Signed)
Copied from [place] [phone]. Topic: General - Other >> Jan 11, 2018 12:41 PM Bea Graff, NT wrote: Reason for CRM: Pts daughter calling and states that [employer] faxed a form over for her mom to be seen yesterday and they are checking status on this form. She then went on to say mom is having burning and pain with urination and her mom is wanting to see if something can be called in. Advised that I can make her an appointment to be seen. Pt was on speaker phone with daughter and stated that she would like something to be sent in without an appt is possible. Please advise.

## 2018-01-12 ENCOUNTER — Telehealth: Payer: Self-pay | Admitting: Internal Medicine

## 2018-01-12 DIAGNOSIS — C9000 Multiple myeloma not having achieved remission: Secondary | ICD-10-CM | POA: Diagnosis not present

## 2018-01-12 DIAGNOSIS — I10 Essential (primary) hypertension: Secondary | ICD-10-CM | POA: Diagnosis not present

## 2018-01-12 DIAGNOSIS — M25561 Pain in right knee: Secondary | ICD-10-CM | POA: Diagnosis not present

## 2018-01-12 DIAGNOSIS — M1991 Primary osteoarthritis, unspecified site: Secondary | ICD-10-CM | POA: Diagnosis not present

## 2018-01-12 DIAGNOSIS — S31831D Laceration without foreign body of anus, subsequent encounter: Secondary | ICD-10-CM | POA: Diagnosis not present

## 2018-01-12 DIAGNOSIS — D126 Benign neoplasm of colon, unspecified: Secondary | ICD-10-CM | POA: Diagnosis not present

## 2018-01-12 NOTE — Telephone Encounter (Signed)
Received form, and given to DR. Plotnikov.

## 2018-01-12 NOTE — Telephone Encounter (Signed)
Copied from [place] [phone]. Topic: Quick Communication - See Telephone Encounter >> Jan 12, 2018  9:33 AM Synthia Innocent wrote: CRM for notification. See Telephone encounter for: 01/12/18. [facility] calling, Patient seen in ER and was prescribed miconazole (MICOTIN) 2 % cream, pharmacy gave her monstat 7, directions are 3-4 times daily as need, it has to be written as either 3 or 4 in order for home to give to patient. Patient would like to do herself, will need order for self administration. Fax [phone]

## 2018-01-12 NOTE — Telephone Encounter (Signed)
Fax given to Dr. Alain Marion.

## 2018-01-12 NOTE — Telephone Encounter (Signed)
Patient calling in a lot of pain, requesting CB# [phone] ASAP, states she needs this medicine

## 2018-01-12 NOTE — Telephone Encounter (Signed)
Pt.notified

## 2018-01-13 DIAGNOSIS — D126 Benign neoplasm of colon, unspecified: Secondary | ICD-10-CM | POA: Diagnosis not present

## 2018-01-13 DIAGNOSIS — M1991 Primary osteoarthritis, unspecified site: Secondary | ICD-10-CM | POA: Diagnosis not present

## 2018-01-13 DIAGNOSIS — S31831D Laceration without foreign body of anus, subsequent encounter: Secondary | ICD-10-CM | POA: Diagnosis not present

## 2018-01-13 DIAGNOSIS — M25561 Pain in right knee: Secondary | ICD-10-CM | POA: Diagnosis not present

## 2018-01-13 DIAGNOSIS — C9000 Multiple myeloma not having achieved remission: Secondary | ICD-10-CM | POA: Diagnosis not present

## 2018-01-13 DIAGNOSIS — I10 Essential (primary) hypertension: Secondary | ICD-10-CM | POA: Diagnosis not present

## 2018-01-14 DIAGNOSIS — M1710 Unilateral primary osteoarthritis, unspecified knee: Secondary | ICD-10-CM | POA: Diagnosis not present

## 2018-01-14 DIAGNOSIS — D126 Benign neoplasm of colon, unspecified: Secondary | ICD-10-CM | POA: Diagnosis not present

## 2018-01-14 DIAGNOSIS — I10 Essential (primary) hypertension: Secondary | ICD-10-CM | POA: Diagnosis not present

## 2018-01-14 DIAGNOSIS — K219 Gastro-esophageal reflux disease without esophagitis: Secondary | ICD-10-CM | POA: Diagnosis not present

## 2018-01-14 DIAGNOSIS — M25561 Pain in right knee: Secondary | ICD-10-CM | POA: Diagnosis not present

## 2018-01-14 DIAGNOSIS — M1991 Primary osteoarthritis, unspecified site: Secondary | ICD-10-CM | POA: Diagnosis not present

## 2018-01-14 DIAGNOSIS — C9000 Multiple myeloma not having achieved remission: Secondary | ICD-10-CM | POA: Diagnosis not present

## 2018-01-14 DIAGNOSIS — N760 Acute vaginitis: Secondary | ICD-10-CM | POA: Diagnosis not present

## 2018-01-14 DIAGNOSIS — S31831D Laceration without foreign body of anus, subsequent encounter: Secondary | ICD-10-CM | POA: Diagnosis not present

## 2018-01-18 ENCOUNTER — Telehealth: Payer: Self-pay | Admitting: Internal Medicine

## 2018-01-18 DIAGNOSIS — M170 Bilateral primary osteoarthritis of knee: Secondary | ICD-10-CM | POA: Diagnosis not present

## 2018-01-18 DIAGNOSIS — R040 Epistaxis: Secondary | ICD-10-CM | POA: Diagnosis not present

## 2018-01-18 DIAGNOSIS — N766 Ulceration of vulva: Secondary | ICD-10-CM | POA: Diagnosis not present

## 2018-01-18 DIAGNOSIS — C9002 Multiple myeloma in relapse: Secondary | ICD-10-CM | POA: Diagnosis not present

## 2018-01-18 NOTE — Telephone Encounter (Signed)
Notified Adrienne w/MD approval../lmb

## 2018-01-18 NOTE — Telephone Encounter (Signed)
Ok Thx 

## 2018-01-18 NOTE — Telephone Encounter (Signed)
Copied from [place] [phone]. Topic: General - Other >> Jan 18, 2018  9:24 AM Lennox Solders wrote: Reason for CRM: adriane PT [facility] is calling to request verbal order to extend this pt physical therapy for twice a wk for 3 wks and then once a wk for 1 wk

## 2018-01-19 DIAGNOSIS — M25561 Pain in right knee: Secondary | ICD-10-CM | POA: Diagnosis not present

## 2018-01-19 DIAGNOSIS — Z79899 Other long term (current) drug therapy: Secondary | ICD-10-CM | POA: Diagnosis not present

## 2018-01-19 DIAGNOSIS — C9000 Multiple myeloma not having achieved remission: Secondary | ICD-10-CM | POA: Diagnosis not present

## 2018-01-19 DIAGNOSIS — M1991 Primary osteoarthritis, unspecified site: Secondary | ICD-10-CM | POA: Diagnosis not present

## 2018-01-19 DIAGNOSIS — D126 Benign neoplasm of colon, unspecified: Secondary | ICD-10-CM | POA: Diagnosis not present

## 2018-01-19 DIAGNOSIS — I10 Essential (primary) hypertension: Secondary | ICD-10-CM | POA: Diagnosis not present

## 2018-01-19 DIAGNOSIS — S31831D Laceration without foreign body of anus, subsequent encounter: Secondary | ICD-10-CM | POA: Diagnosis not present

## 2018-01-20 ENCOUNTER — Ambulatory Visit: Payer: Medicare Other | Admitting: Hematology & Oncology

## 2018-01-20 ENCOUNTER — Non-Acute Institutional Stay: Payer: Medicare Other | Admitting: Licensed Clinical Social Worker

## 2018-01-20 ENCOUNTER — Other Ambulatory Visit: Payer: Medicare Other

## 2018-01-20 ENCOUNTER — Ambulatory Visit: Payer: Medicare Other

## 2018-01-20 DIAGNOSIS — I10 Essential (primary) hypertension: Secondary | ICD-10-CM | POA: Diagnosis not present

## 2018-01-20 DIAGNOSIS — D126 Benign neoplasm of colon, unspecified: Secondary | ICD-10-CM | POA: Diagnosis not present

## 2018-01-20 DIAGNOSIS — M25561 Pain in right knee: Secondary | ICD-10-CM | POA: Diagnosis not present

## 2018-01-20 DIAGNOSIS — M1991 Primary osteoarthritis, unspecified site: Secondary | ICD-10-CM | POA: Diagnosis not present

## 2018-01-20 DIAGNOSIS — Z515 Encounter for palliative care: Secondary | ICD-10-CM

## 2018-01-20 DIAGNOSIS — S31831D Laceration without foreign body of anus, subsequent encounter: Secondary | ICD-10-CM | POA: Diagnosis not present

## 2018-01-20 DIAGNOSIS — C9000 Multiple myeloma not having achieved remission: Secondary | ICD-10-CM | POA: Diagnosis not present

## 2018-01-21 ENCOUNTER — Ambulatory Visit: Payer: Medicare Other

## 2018-01-22 DIAGNOSIS — M25561 Pain in right knee: Secondary | ICD-10-CM | POA: Diagnosis not present

## 2018-01-22 DIAGNOSIS — D126 Benign neoplasm of colon, unspecified: Secondary | ICD-10-CM | POA: Diagnosis not present

## 2018-01-22 DIAGNOSIS — M1991 Primary osteoarthritis, unspecified site: Secondary | ICD-10-CM | POA: Diagnosis not present

## 2018-01-22 DIAGNOSIS — C9000 Multiple myeloma not having achieved remission: Secondary | ICD-10-CM | POA: Diagnosis not present

## 2018-01-22 DIAGNOSIS — I10 Essential (primary) hypertension: Secondary | ICD-10-CM | POA: Diagnosis not present

## 2018-01-22 DIAGNOSIS — S31831D Laceration without foreign body of anus, subsequent encounter: Secondary | ICD-10-CM | POA: Diagnosis not present

## 2018-01-23 NOTE — Progress Notes (Signed)
COMMUNITY PALLIATIVE CARE SW NOTE  PATIENT NAME: Mindy Taylor DOB: [birth year] MRN: [id number]  PRIMARY CARE PROVIDER: Cassandria Anger, MD  RESPONSIBLE PARTY:  Acct ID - Guarantor Home Phone Work Phone Relationship Acct Type  [id number] - [place]patrick,[place]* [phone]  Self P/F     [facility], [place], [place] [address]     PLAN OF CARE and INTERVENTIONS:             1. GOALS OF CARE/ ADVANCE CARE PLANNING:  Patient's niece, Jazae, wishes for patient to be comfortable and cared for in a timely manor.  She has a DNR and MOST form. 2. SOCIAL/EMOTIONAL/SPIRITUAL ASSESSMENT/ INTERVENTIONS:  SW met with patient facility CNA, Caryl Pina, in patient's room.  Patient had just finished receiving assistance with a bowel movement.  Staff reported patient had blood in the area, but were not sure of it's origin.  Patient was recently treated for a yeast infection.  She denied pain and itching.  She displayed a sad affect.  SW provided active listening and supportive counseling. 3. PATIENT/CAREGIVER EDUCATION/ COPING:  Patient copes by expressing herself.  SW provided education to facility staff regarding Palliative Care program. 4. PERSONAL EMERGENCY PLAN:  Per facility protocol. 5. COMMUNITY RESOURCES COORDINATION/ HEALTH CARE NAVIGATION:  None. 6. FINANCIAL/LEGAL CONCERNS/INTERVENTIONS:  None.     SOCIAL HX:  Social History   Tobacco Use  . Smoking status: Former Research scientist (life sciences)  . Smokeless tobacco: Never Used  . Tobacco comment: Not sure when; many, many years   Substance Use Topics  . Alcohol use: No    Alcohol/week: 0.0 standard drinks    CODE STATUS:  DNR  ADVANCED DIRECTIVES: N MOST FORM COMPLETE:  Yes HOSPICE EDUCATION PROVIDED: N PPS:  Patient's appetite is normal.  She propels herself in her w/c. Duration of visit and documentation:  60 minutes.      Creola Corn Kyden Potash, LCSW

## 2018-01-24 DIAGNOSIS — D126 Benign neoplasm of colon, unspecified: Secondary | ICD-10-CM | POA: Diagnosis not present

## 2018-01-24 DIAGNOSIS — M1991 Primary osteoarthritis, unspecified site: Secondary | ICD-10-CM | POA: Diagnosis not present

## 2018-01-24 DIAGNOSIS — S31831D Laceration without foreign body of anus, subsequent encounter: Secondary | ICD-10-CM | POA: Diagnosis not present

## 2018-01-24 DIAGNOSIS — I10 Essential (primary) hypertension: Secondary | ICD-10-CM | POA: Diagnosis not present

## 2018-01-24 DIAGNOSIS — C9000 Multiple myeloma not having achieved remission: Secondary | ICD-10-CM | POA: Diagnosis not present

## 2018-01-24 DIAGNOSIS — M25561 Pain in right knee: Secondary | ICD-10-CM | POA: Diagnosis not present

## 2018-01-25 DIAGNOSIS — D6489 Other specified anemias: Secondary | ICD-10-CM | POA: Diagnosis not present

## 2018-01-25 DIAGNOSIS — C9002 Multiple myeloma in relapse: Secondary | ICD-10-CM | POA: Diagnosis not present

## 2018-01-25 DIAGNOSIS — N898 Other specified noninflammatory disorders of vagina: Secondary | ICD-10-CM | POA: Diagnosis not present

## 2018-01-25 DIAGNOSIS — F0631 Mood disorder due to known physiological condition with depressive features: Secondary | ICD-10-CM | POA: Diagnosis not present

## 2018-01-27 DIAGNOSIS — M25561 Pain in right knee: Secondary | ICD-10-CM | POA: Diagnosis not present

## 2018-01-27 DIAGNOSIS — R531 Weakness: Secondary | ICD-10-CM | POA: Diagnosis not present

## 2018-01-27 DIAGNOSIS — M1991 Primary osteoarthritis, unspecified site: Secondary | ICD-10-CM | POA: Diagnosis not present

## 2018-01-27 DIAGNOSIS — I10 Essential (primary) hypertension: Secondary | ICD-10-CM | POA: Diagnosis not present

## 2018-01-27 DIAGNOSIS — C9002 Multiple myeloma in relapse: Secondary | ICD-10-CM | POA: Diagnosis not present

## 2018-01-27 DIAGNOSIS — H109 Unspecified conjunctivitis: Secondary | ICD-10-CM | POA: Diagnosis not present

## 2018-01-27 DIAGNOSIS — L8992 Pressure ulcer of unspecified site, stage 2: Secondary | ICD-10-CM | POA: Diagnosis not present

## 2018-01-27 DIAGNOSIS — C9000 Multiple myeloma not having achieved remission: Secondary | ICD-10-CM | POA: Diagnosis not present

## 2018-01-27 DIAGNOSIS — S31831D Laceration without foreign body of anus, subsequent encounter: Secondary | ICD-10-CM | POA: Diagnosis not present

## 2018-01-27 DIAGNOSIS — D126 Benign neoplasm of colon, unspecified: Secondary | ICD-10-CM | POA: Diagnosis not present

## 2018-01-29 DIAGNOSIS — S31831D Laceration without foreign body of anus, subsequent encounter: Secondary | ICD-10-CM | POA: Diagnosis not present

## 2018-01-29 DIAGNOSIS — I10 Essential (primary) hypertension: Secondary | ICD-10-CM | POA: Diagnosis not present

## 2018-01-29 DIAGNOSIS — D126 Benign neoplasm of colon, unspecified: Secondary | ICD-10-CM | POA: Diagnosis not present

## 2018-01-29 DIAGNOSIS — C9000 Multiple myeloma not having achieved remission: Secondary | ICD-10-CM | POA: Diagnosis not present

## 2018-01-29 DIAGNOSIS — M1991 Primary osteoarthritis, unspecified site: Secondary | ICD-10-CM | POA: Diagnosis not present

## 2018-01-29 DIAGNOSIS — M25561 Pain in right knee: Secondary | ICD-10-CM | POA: Diagnosis not present

## 2018-02-01 DIAGNOSIS — F0631 Mood disorder due to known physiological condition with depressive features: Secondary | ICD-10-CM | POA: Diagnosis not present

## 2018-02-01 DIAGNOSIS — R531 Weakness: Secondary | ICD-10-CM | POA: Diagnosis not present

## 2018-02-01 DIAGNOSIS — C9002 Multiple myeloma in relapse: Secondary | ICD-10-CM | POA: Diagnosis not present

## 2018-02-01 DIAGNOSIS — N898 Other specified noninflammatory disorders of vagina: Secondary | ICD-10-CM | POA: Diagnosis not present

## 2018-02-02 DIAGNOSIS — I10 Essential (primary) hypertension: Secondary | ICD-10-CM | POA: Diagnosis not present

## 2018-02-02 DIAGNOSIS — M1991 Primary osteoarthritis, unspecified site: Secondary | ICD-10-CM | POA: Diagnosis not present

## 2018-02-02 DIAGNOSIS — M25561 Pain in right knee: Secondary | ICD-10-CM | POA: Diagnosis not present

## 2018-02-02 DIAGNOSIS — D126 Benign neoplasm of colon, unspecified: Secondary | ICD-10-CM | POA: Diagnosis not present

## 2018-02-02 DIAGNOSIS — S31831D Laceration without foreign body of anus, subsequent encounter: Secondary | ICD-10-CM | POA: Diagnosis not present

## 2018-02-02 DIAGNOSIS — C9000 Multiple myeloma not having achieved remission: Secondary | ICD-10-CM | POA: Diagnosis not present

## 2018-02-03 DIAGNOSIS — I10 Essential (primary) hypertension: Secondary | ICD-10-CM | POA: Diagnosis not present

## 2018-02-03 DIAGNOSIS — R269 Unspecified abnormalities of gait and mobility: Secondary | ICD-10-CM | POA: Diagnosis not present

## 2018-02-03 DIAGNOSIS — F331 Major depressive disorder, recurrent, moderate: Secondary | ICD-10-CM | POA: Diagnosis not present

## 2018-02-03 DIAGNOSIS — F411 Generalized anxiety disorder: Secondary | ICD-10-CM | POA: Diagnosis not present

## 2018-02-03 DIAGNOSIS — M150 Primary generalized (osteo)arthritis: Secondary | ICD-10-CM | POA: Diagnosis not present

## 2018-02-03 DIAGNOSIS — K219 Gastro-esophageal reflux disease without esophagitis: Secondary | ICD-10-CM | POA: Diagnosis not present

## 2018-02-04 DIAGNOSIS — I10 Essential (primary) hypertension: Secondary | ICD-10-CM | POA: Diagnosis not present

## 2018-02-04 DIAGNOSIS — S31831D Laceration without foreign body of anus, subsequent encounter: Secondary | ICD-10-CM | POA: Diagnosis not present

## 2018-02-04 DIAGNOSIS — C9000 Multiple myeloma not having achieved remission: Secondary | ICD-10-CM | POA: Diagnosis not present

## 2018-02-04 DIAGNOSIS — D126 Benign neoplasm of colon, unspecified: Secondary | ICD-10-CM | POA: Diagnosis not present

## 2018-02-04 DIAGNOSIS — M25561 Pain in right knee: Secondary | ICD-10-CM | POA: Diagnosis not present

## 2018-02-04 DIAGNOSIS — M1991 Primary osteoarthritis, unspecified site: Secondary | ICD-10-CM | POA: Diagnosis not present

## 2018-02-05 ENCOUNTER — Encounter: Payer: Medicare Other | Admitting: Licensed Clinical Social Worker

## 2018-02-05 DIAGNOSIS — F339 Major depressive disorder, recurrent, unspecified: Secondary | ICD-10-CM | POA: Diagnosis not present

## 2018-02-05 DIAGNOSIS — I679 Cerebrovascular disease, unspecified: Secondary | ICD-10-CM | POA: Diagnosis not present

## 2018-02-05 DIAGNOSIS — D630 Anemia in neoplastic disease: Secondary | ICD-10-CM | POA: Diagnosis not present

## 2018-02-05 DIAGNOSIS — N183 Chronic kidney disease, stage 3 (moderate): Secondary | ICD-10-CM | POA: Diagnosis not present

## 2018-02-05 DIAGNOSIS — J301 Allergic rhinitis due to pollen: Secondary | ICD-10-CM | POA: Diagnosis not present

## 2018-02-05 DIAGNOSIS — C9000 Multiple myeloma not having achieved remission: Secondary | ICD-10-CM | POA: Diagnosis not present

## 2018-02-05 DIAGNOSIS — D126 Benign neoplasm of colon, unspecified: Secondary | ICD-10-CM | POA: Diagnosis not present

## 2018-02-05 DIAGNOSIS — K219 Gastro-esophageal reflux disease without esophagitis: Secondary | ICD-10-CM | POA: Diagnosis not present

## 2018-02-08 ENCOUNTER — Non-Acute Institutional Stay: Payer: Medicare Other | Admitting: Licensed Clinical Social Worker

## 2018-02-08 DIAGNOSIS — Z515 Encounter for palliative care: Secondary | ICD-10-CM

## 2018-02-08 DIAGNOSIS — D6489 Other specified anemias: Secondary | ICD-10-CM | POA: Diagnosis not present

## 2018-02-08 DIAGNOSIS — N189 Chronic kidney disease, unspecified: Secondary | ICD-10-CM | POA: Diagnosis not present

## 2018-02-08 DIAGNOSIS — R627 Adult failure to thrive: Secondary | ICD-10-CM | POA: Diagnosis not present

## 2018-02-08 DIAGNOSIS — D126 Benign neoplasm of colon, unspecified: Secondary | ICD-10-CM | POA: Diagnosis not present

## 2018-02-08 DIAGNOSIS — D630 Anemia in neoplastic disease: Secondary | ICD-10-CM | POA: Diagnosis not present

## 2018-02-08 DIAGNOSIS — C189 Malignant neoplasm of colon, unspecified: Secondary | ICD-10-CM | POA: Diagnosis not present

## 2018-02-08 DIAGNOSIS — C9000 Multiple myeloma not having achieved remission: Secondary | ICD-10-CM | POA: Diagnosis not present

## 2018-02-08 DIAGNOSIS — I679 Cerebrovascular disease, unspecified: Secondary | ICD-10-CM | POA: Diagnosis not present

## 2018-02-08 DIAGNOSIS — N183 Chronic kidney disease, stage 3 (moderate): Secondary | ICD-10-CM | POA: Diagnosis not present

## 2018-02-09 NOTE — Progress Notes (Signed)
COMMUNITY PALLIATIVE CARE SW NOTE  PATIENT NAME: Mindy Taylor DOB: 27-Apr-1929 MRN: [id number]  PRIMARY CARE PROVIDER: Cassandria Anger, MD  RESPONSIBLE PARTY:  Acct ID - Guarantor Home Phone Work Phone Relationship Acct Type  [id number] - [place]* [phone]  Self P/F     [facility], [place], [place] [address]     PLAN OF CARE and INTERVENTIONS:             1. GOALS OF CARE/ ADVANCE CARE PLANNING:  [name], patient's niece, wants her cared for in a timely manor and to be comfortable.  Patient has a DNR and MOST form. 2. SOCIAL/EMOTIONAL/SPIRITUAL ASSESSMENT/ INTERVENTIONS:  SW met with patient in her room.  She was in her w/c and appeared to be sleeping.  She responded to verbal/tactile prompts.  She remained sleepy during the visit and did not answer all of the questions.  She said she was able to sleep in her bed last night, but remains tired.  She denied itching or pain. 3. PATIENT/CAREGIVER EDUCATION/ COPING:  Patient by expressing herself. 4. PERSONAL EMERGENCY PLAN:  Per facility protocol. 5. COMMUNITY RESOURCES COORDINATION/ HEALTH CARE NAVIGATION:  None 6. FINANCIAL/LEGAL CONCERNS/INTERVENTIONS:  None     SOCIAL HX:  Social History   Tobacco Use  . Smoking status: Former Research scientist (life sciences)  . Smokeless tobacco: Never Used  . Tobacco comment: Not sure when; many, many years   Substance Use Topics  . Alcohol use: No    Alcohol/week: 0.0 standard drinks    CODE STATUS:  DNR  ADVANCED DIRECTIVES: N MOST FORM COMPLETE:  Y HOSPICE EDUCATION PROVIDED:  N PPS:  Patient's appetite is normal.  She is able to propel herself in her w/c. Duration of visit and documentation:  45 minutes.      Creola Corn Keiasia Christianson, LCSW

## 2018-02-10 DIAGNOSIS — N183 Chronic kidney disease, stage 3 (moderate): Secondary | ICD-10-CM | POA: Diagnosis not present

## 2018-02-10 DIAGNOSIS — C9000 Multiple myeloma not having achieved remission: Secondary | ICD-10-CM | POA: Diagnosis not present

## 2018-02-10 DIAGNOSIS — D126 Benign neoplasm of colon, unspecified: Secondary | ICD-10-CM | POA: Diagnosis not present

## 2018-02-10 DIAGNOSIS — D630 Anemia in neoplastic disease: Secondary | ICD-10-CM | POA: Diagnosis not present

## 2018-02-10 DIAGNOSIS — I679 Cerebrovascular disease, unspecified: Secondary | ICD-10-CM | POA: Diagnosis not present

## 2018-02-11 DIAGNOSIS — I679 Cerebrovascular disease, unspecified: Secondary | ICD-10-CM | POA: Diagnosis not present

## 2018-02-11 DIAGNOSIS — D6489 Other specified anemias: Secondary | ICD-10-CM | POA: Diagnosis not present

## 2018-02-11 DIAGNOSIS — R627 Adult failure to thrive: Secondary | ICD-10-CM | POA: Diagnosis not present

## 2018-02-11 DIAGNOSIS — R140 Abdominal distension (gaseous): Secondary | ICD-10-CM | POA: Diagnosis not present

## 2018-02-11 DIAGNOSIS — R197 Diarrhea, unspecified: Secondary | ICD-10-CM | POA: Diagnosis not present

## 2018-02-11 DIAGNOSIS — D126 Benign neoplasm of colon, unspecified: Secondary | ICD-10-CM | POA: Diagnosis not present

## 2018-02-11 DIAGNOSIS — Z79899 Other long term (current) drug therapy: Secondary | ICD-10-CM | POA: Diagnosis not present

## 2018-02-11 DIAGNOSIS — N183 Chronic kidney disease, stage 3 (moderate): Secondary | ICD-10-CM | POA: Diagnosis not present

## 2018-02-11 DIAGNOSIS — C9000 Multiple myeloma not having achieved remission: Secondary | ICD-10-CM | POA: Diagnosis not present

## 2018-02-11 DIAGNOSIS — D630 Anemia in neoplastic disease: Secondary | ICD-10-CM | POA: Diagnosis not present

## 2018-02-12 DIAGNOSIS — I679 Cerebrovascular disease, unspecified: Secondary | ICD-10-CM | POA: Diagnosis not present

## 2018-02-12 DIAGNOSIS — D630 Anemia in neoplastic disease: Secondary | ICD-10-CM | POA: Diagnosis not present

## 2018-02-12 DIAGNOSIS — D126 Benign neoplasm of colon, unspecified: Secondary | ICD-10-CM | POA: Diagnosis not present

## 2018-02-12 DIAGNOSIS — C9000 Multiple myeloma not having achieved remission: Secondary | ICD-10-CM | POA: Diagnosis not present

## 2018-02-12 DIAGNOSIS — N183 Chronic kidney disease, stage 3 (moderate): Secondary | ICD-10-CM | POA: Diagnosis not present

## 2018-02-13 DIAGNOSIS — D630 Anemia in neoplastic disease: Secondary | ICD-10-CM | POA: Diagnosis not present

## 2018-02-13 DIAGNOSIS — D126 Benign neoplasm of colon, unspecified: Secondary | ICD-10-CM | POA: Diagnosis not present

## 2018-02-13 DIAGNOSIS — I679 Cerebrovascular disease, unspecified: Secondary | ICD-10-CM | POA: Diagnosis not present

## 2018-02-13 DIAGNOSIS — N183 Chronic kidney disease, stage 3 (moderate): Secondary | ICD-10-CM | POA: Diagnosis not present

## 2018-02-13 DIAGNOSIS — C9000 Multiple myeloma not having achieved remission: Secondary | ICD-10-CM | POA: Diagnosis not present

## 2018-02-14 DIAGNOSIS — D630 Anemia in neoplastic disease: Secondary | ICD-10-CM | POA: Diagnosis not present

## 2018-02-14 DIAGNOSIS — N183 Chronic kidney disease, stage 3 (moderate): Secondary | ICD-10-CM | POA: Diagnosis not present

## 2018-02-14 DIAGNOSIS — I679 Cerebrovascular disease, unspecified: Secondary | ICD-10-CM | POA: Diagnosis not present

## 2018-02-14 DIAGNOSIS — C9000 Multiple myeloma not having achieved remission: Secondary | ICD-10-CM | POA: Diagnosis not present

## 2018-02-14 DIAGNOSIS — D126 Benign neoplasm of colon, unspecified: Secondary | ICD-10-CM | POA: Diagnosis not present

## 2018-02-15 DIAGNOSIS — I679 Cerebrovascular disease, unspecified: Secondary | ICD-10-CM | POA: Diagnosis not present

## 2018-02-15 DIAGNOSIS — D126 Benign neoplasm of colon, unspecified: Secondary | ICD-10-CM | POA: Diagnosis not present

## 2018-02-15 DIAGNOSIS — D630 Anemia in neoplastic disease: Secondary | ICD-10-CM | POA: Diagnosis not present

## 2018-02-15 DIAGNOSIS — C9000 Multiple myeloma not having achieved remission: Secondary | ICD-10-CM | POA: Diagnosis not present

## 2018-02-15 DIAGNOSIS — N183 Chronic kidney disease, stage 3 (moderate): Secondary | ICD-10-CM | POA: Diagnosis not present

## 2018-02-16 DIAGNOSIS — D630 Anemia in neoplastic disease: Secondary | ICD-10-CM | POA: Diagnosis not present

## 2018-02-16 DIAGNOSIS — C9000 Multiple myeloma not having achieved remission: Secondary | ICD-10-CM | POA: Diagnosis not present

## 2018-02-16 DIAGNOSIS — I679 Cerebrovascular disease, unspecified: Secondary | ICD-10-CM | POA: Diagnosis not present

## 2018-02-16 DIAGNOSIS — D126 Benign neoplasm of colon, unspecified: Secondary | ICD-10-CM | POA: Diagnosis not present

## 2018-02-16 DIAGNOSIS — N183 Chronic kidney disease, stage 3 (moderate): Secondary | ICD-10-CM | POA: Diagnosis not present

## 2018-02-17 DIAGNOSIS — I679 Cerebrovascular disease, unspecified: Secondary | ICD-10-CM | POA: Diagnosis not present

## 2018-02-17 DIAGNOSIS — N183 Chronic kidney disease, stage 3 (moderate): Secondary | ICD-10-CM | POA: Diagnosis not present

## 2018-02-17 DIAGNOSIS — D630 Anemia in neoplastic disease: Secondary | ICD-10-CM | POA: Diagnosis not present

## 2018-02-17 DIAGNOSIS — C9000 Multiple myeloma not having achieved remission: Secondary | ICD-10-CM | POA: Diagnosis not present

## 2018-02-17 DIAGNOSIS — D126 Benign neoplasm of colon, unspecified: Secondary | ICD-10-CM | POA: Diagnosis not present

## 2018-02-23 DEATH — deceased

## 2018-02-25 ENCOUNTER — Non-Acute Institutional Stay: Payer: Medicare Other | Admitting: Licensed Clinical Social Worker

## 2018-02-25 DIAGNOSIS — Z515 Encounter for palliative care: Secondary | ICD-10-CM

## 2018-02-25 NOTE — Progress Notes (Signed)
Palliative Care SW went to [facility] to meet with patient for a visit.  Was informed by facility staff that patient died on 2018-02-18.

## 2018-04-13 ENCOUNTER — Ambulatory Visit: Payer: Medicare Other | Admitting: Internal Medicine

## 2021-04-12 ENCOUNTER — Other Ambulatory Visit: Payer: Self-pay | Admitting: Nurse Practitioner
